# Patient Record
Sex: Female | Born: 1935
Health system: Southern US, Community
[De-identification: ages and names within clinical notes are randomized; demographics above are authoritative.]

## PROBLEM LIST (undated history)

## (undated) DIAGNOSIS — G250 Essential tremor: Principal | ICD-10-CM

## (undated) DIAGNOSIS — I10 Essential (primary) hypertension: Secondary | ICD-10-CM

## (undated) DIAGNOSIS — E669 Obesity, unspecified: Secondary | ICD-10-CM

## (undated) DIAGNOSIS — R112 Nausea with vomiting, unspecified: Secondary | ICD-10-CM

## (undated) DIAGNOSIS — C801 Malignant (primary) neoplasm, unspecified: Secondary | ICD-10-CM

## (undated) DIAGNOSIS — J189 Pneumonia, unspecified organism: Secondary | ICD-10-CM

## (undated) DIAGNOSIS — K429 Umbilical hernia without obstruction or gangrene: Secondary | ICD-10-CM

## (undated) DIAGNOSIS — K8020 Calculus of gallbladder without cholecystitis without obstruction: Secondary | ICD-10-CM

## (undated) DIAGNOSIS — T8859XA Other complications of anesthesia, initial encounter: Secondary | ICD-10-CM

## (undated) DIAGNOSIS — B958 Unspecified staphylococcus as the cause of diseases classified elsewhere: Secondary | ICD-10-CM

## (undated) DIAGNOSIS — Z87440 Personal history of urinary (tract) infections: Secondary | ICD-10-CM

## (undated) DIAGNOSIS — Z9889 Other specified postprocedural states: Secondary | ICD-10-CM

## (undated) DIAGNOSIS — S62109A Fracture of unspecified carpal bone, unspecified wrist, initial encounter for closed fracture: Secondary | ICD-10-CM

## (undated) DIAGNOSIS — M1990 Unspecified osteoarthritis, unspecified site: Secondary | ICD-10-CM

## (undated) DIAGNOSIS — N189 Chronic kidney disease, unspecified: Secondary | ICD-10-CM

## (undated) DIAGNOSIS — R413 Other amnesia: Secondary | ICD-10-CM

## (undated) DIAGNOSIS — R609 Edema, unspecified: Secondary | ICD-10-CM

## (undated) DIAGNOSIS — E1142 Type 2 diabetes mellitus with diabetic polyneuropathy: Secondary | ICD-10-CM

## (undated) DIAGNOSIS — M545 Low back pain, unspecified: Secondary | ICD-10-CM

## (undated) DIAGNOSIS — Z87448 Personal history of other diseases of urinary system: Secondary | ICD-10-CM

## (undated) DIAGNOSIS — T4145XA Adverse effect of unspecified anesthetic, initial encounter: Secondary | ICD-10-CM

## (undated) DIAGNOSIS — K219 Gastro-esophageal reflux disease without esophagitis: Secondary | ICD-10-CM

## (undated) DIAGNOSIS — E785 Hyperlipidemia, unspecified: Secondary | ICD-10-CM

## (undated) DIAGNOSIS — D649 Anemia, unspecified: Secondary | ICD-10-CM

## (undated) DIAGNOSIS — R600 Localized edema: Secondary | ICD-10-CM

## (undated) DIAGNOSIS — M5136 Other intervertebral disc degeneration, lumbar region: Secondary | ICD-10-CM

## (undated) DIAGNOSIS — F5104 Psychophysiologic insomnia: Secondary | ICD-10-CM

## (undated) DIAGNOSIS — M51369 Other intervertebral disc degeneration, lumbar region without mention of lumbar back pain or lower extremity pain: Secondary | ICD-10-CM

## (undated) DIAGNOSIS — G8929 Other chronic pain: Secondary | ICD-10-CM

## (undated) HISTORY — DX: Malignant (primary) neoplasm, unspecified: C80.1

## (undated) HISTORY — PX: OOPHORECTOMY: SHX86

## (undated) HISTORY — PX: EYE SURGERY: SHX253

## (undated) HISTORY — DX: Low back pain: M54.5

## (undated) HISTORY — DX: Localized edema: R60.0

## (undated) HISTORY — PX: COLONOSCOPY: SHX174

## (undated) HISTORY — DX: Other amnesia: R41.3

## (undated) HISTORY — DX: Psychophysiologic insomnia: F51.04

## (undated) HISTORY — DX: Low back pain, unspecified: M54.50

## (undated) HISTORY — PX: ABDOMINAL HYSTERECTOMY: SHX81

## (undated) HISTORY — DX: Other chronic pain: G89.29

## (undated) HISTORY — DX: Calculus of gallbladder without cholecystitis without obstruction: K80.20

## (undated) HISTORY — DX: Type 2 diabetes mellitus with diabetic polyneuropathy: E11.42

## (undated) HISTORY — PX: NECK SURGERY: SHX720

## (undated) HISTORY — PX: HERNIA REPAIR: SHX51

## (undated) HISTORY — DX: Obesity, unspecified: E66.9

## (undated) HISTORY — PX: TOTAL KNEE ARTHROPLASTY: SHX125

## (undated) HISTORY — DX: Unspecified staphylococcus as the cause of diseases classified elsewhere: B95.8

## (undated) HISTORY — DX: Essential tremor: G25.0

## (undated) HISTORY — PX: CATARACT EXTRACTION: SUR2

## (undated) HISTORY — DX: Edema, unspecified: R60.9

## (undated) HISTORY — DX: Fracture of unspecified carpal bone, unspecified wrist, initial encounter for closed fracture: S62.109A

---

## 1998-09-07 HISTORY — PX: CARDIAC CATHETERIZATION: SHX172

## 2000-05-15 ENCOUNTER — Encounter: Payer: Self-pay | Admitting: Emergency Medicine

## 2000-05-15 ENCOUNTER — Emergency Department (HOSPITAL_COMMUNITY): Admission: EM | Admit: 2000-05-15 | Discharge: 2000-05-16 | Payer: Self-pay | Admitting: Emergency Medicine

## 2000-11-24 ENCOUNTER — Ambulatory Visit (HOSPITAL_COMMUNITY): Admission: RE | Admit: 2000-11-24 | Discharge: 2000-11-24 | Payer: Self-pay | Admitting: Gastroenterology

## 2002-09-28 ENCOUNTER — Emergency Department (HOSPITAL_COMMUNITY): Admission: EM | Admit: 2002-09-28 | Discharge: 2002-09-28 | Payer: Self-pay | Admitting: Emergency Medicine

## 2002-11-28 ENCOUNTER — Encounter: Payer: Self-pay | Admitting: Emergency Medicine

## 2002-11-28 ENCOUNTER — Emergency Department (HOSPITAL_COMMUNITY): Admission: EM | Admit: 2002-11-28 | Discharge: 2002-11-29 | Payer: Self-pay | Admitting: Emergency Medicine

## 2002-12-13 ENCOUNTER — Encounter: Payer: Self-pay | Admitting: *Deleted

## 2002-12-13 ENCOUNTER — Encounter: Admission: RE | Admit: 2002-12-13 | Discharge: 2002-12-13 | Payer: Self-pay | Admitting: *Deleted

## 2003-01-10 ENCOUNTER — Encounter: Payer: Self-pay | Admitting: *Deleted

## 2003-01-10 ENCOUNTER — Encounter: Admission: RE | Admit: 2003-01-10 | Discharge: 2003-01-10 | Payer: Self-pay | Admitting: *Deleted

## 2003-01-25 ENCOUNTER — Encounter: Admission: RE | Admit: 2003-01-25 | Discharge: 2003-04-25 | Payer: Self-pay | Admitting: Family Medicine

## 2003-04-18 ENCOUNTER — Encounter: Payer: Self-pay | Admitting: Neurosurgery

## 2003-04-20 ENCOUNTER — Encounter: Payer: Self-pay | Admitting: Neurosurgery

## 2003-04-20 ENCOUNTER — Inpatient Hospital Stay (HOSPITAL_COMMUNITY): Admission: RE | Admit: 2003-04-20 | Discharge: 2003-04-22 | Payer: Self-pay | Admitting: Neurosurgery

## 2003-08-21 ENCOUNTER — Encounter: Admission: RE | Admit: 2003-08-21 | Discharge: 2003-09-27 | Payer: Self-pay | Admitting: Neurosurgery

## 2003-08-22 ENCOUNTER — Encounter: Admission: RE | Admit: 2003-08-22 | Discharge: 2003-08-22 | Payer: Self-pay | Admitting: Infectious Diseases

## 2003-09-08 HISTORY — PX: BACK SURGERY: SHX140

## 2003-12-05 ENCOUNTER — Emergency Department (HOSPITAL_COMMUNITY): Admission: AD | Admit: 2003-12-05 | Discharge: 2003-12-05 | Payer: Self-pay | Admitting: Family Medicine

## 2003-12-06 ENCOUNTER — Emergency Department (HOSPITAL_COMMUNITY): Admission: EM | Admit: 2003-12-06 | Discharge: 2003-12-06 | Payer: Self-pay | Admitting: Family Medicine

## 2005-09-12 ENCOUNTER — Emergency Department (HOSPITAL_COMMUNITY): Admission: EM | Admit: 2005-09-12 | Discharge: 2005-09-12 | Payer: Self-pay | Admitting: Emergency Medicine

## 2006-04-06 ENCOUNTER — Emergency Department (HOSPITAL_COMMUNITY): Admission: EM | Admit: 2006-04-06 | Discharge: 2006-04-06 | Payer: Self-pay | Admitting: Emergency Medicine

## 2006-04-15 ENCOUNTER — Ambulatory Visit: Payer: Self-pay

## 2006-04-17 ENCOUNTER — Inpatient Hospital Stay (HOSPITAL_COMMUNITY): Admission: EM | Admit: 2006-04-17 | Discharge: 2006-04-19 | Payer: Self-pay | Admitting: Emergency Medicine

## 2006-12-13 ENCOUNTER — Inpatient Hospital Stay (HOSPITAL_COMMUNITY): Admission: RE | Admit: 2006-12-13 | Discharge: 2006-12-15 | Payer: Self-pay | Admitting: Orthopedic Surgery

## 2007-03-02 ENCOUNTER — Emergency Department (HOSPITAL_COMMUNITY): Admission: EM | Admit: 2007-03-02 | Discharge: 2007-03-02 | Payer: Self-pay | Admitting: Emergency Medicine

## 2007-04-06 ENCOUNTER — Encounter: Admission: RE | Admit: 2007-04-06 | Discharge: 2007-04-06 | Payer: Self-pay | Admitting: *Deleted

## 2008-07-30 ENCOUNTER — Encounter: Admission: RE | Admit: 2008-07-30 | Discharge: 2008-07-30 | Payer: Self-pay | Admitting: Surgery

## 2009-04-18 ENCOUNTER — Encounter: Admission: RE | Admit: 2009-04-18 | Discharge: 2009-04-18 | Payer: Self-pay | Admitting: Surgery

## 2009-07-08 ENCOUNTER — Encounter: Admission: RE | Admit: 2009-07-08 | Discharge: 2009-07-08 | Payer: Self-pay | Admitting: Surgery

## 2009-07-26 ENCOUNTER — Encounter: Admission: RE | Admit: 2009-07-26 | Discharge: 2009-07-26 | Payer: Self-pay | Admitting: Internal Medicine

## 2009-11-21 ENCOUNTER — Emergency Department (HOSPITAL_BASED_OUTPATIENT_CLINIC_OR_DEPARTMENT_OTHER): Admission: EM | Admit: 2009-11-21 | Discharge: 2009-11-21 | Payer: Self-pay | Admitting: Emergency Medicine

## 2010-09-27 ENCOUNTER — Encounter: Payer: Self-pay | Admitting: *Deleted

## 2010-09-28 ENCOUNTER — Encounter: Payer: Self-pay | Admitting: Surgery

## 2010-09-29 ENCOUNTER — Encounter: Payer: Self-pay | Admitting: Surgery

## 2010-12-01 LAB — DIFFERENTIAL
Basophils Absolute: 0 10*3/uL (ref 0.0–0.1)
Basophils Relative: 0 % (ref 0–1)
Eosinophils Absolute: 0.3 10*3/uL (ref 0.0–0.7)
Eosinophils Relative: 2 % (ref 0–5)
Lymphocytes Relative: 9 % — ABNORMAL LOW (ref 12–46)
Lymphs Abs: 1.4 10*3/uL (ref 0.7–4.0)
Monocytes Absolute: 0.8 10*3/uL (ref 0.1–1.0)
Monocytes Relative: 5 % (ref 3–12)
Neutro Abs: 13.2 10*3/uL — ABNORMAL HIGH (ref 1.7–7.7)
Neutrophils Relative %: 84 % — ABNORMAL HIGH (ref 43–77)

## 2010-12-01 LAB — COMPREHENSIVE METABOLIC PANEL
ALT: 21 U/L (ref 0–35)
AST: 27 U/L (ref 0–37)
Albumin: 4.4 g/dL (ref 3.5–5.2)
Alkaline Phosphatase: 78 U/L (ref 39–117)
BUN: 21 mg/dL (ref 6–23)
CO2: 25 mEq/L (ref 19–32)
Calcium: 9.3 mg/dL (ref 8.4–10.5)
Chloride: 109 mEq/L (ref 96–112)
Creatinine, Ser: 0.7 mg/dL (ref 0.4–1.2)
GFR calc Af Amer: >60 mL/min (ref >60)
GFR calc non Af Amer: >60 mL/min (ref >60)
Glucose, Bld: 169 mg/dL — ABNORMAL HIGH (ref 70–99)
Potassium: 4.9 mEq/L (ref 3.5–5.1)
Sodium: 147 mEq/L — ABNORMAL HIGH (ref 135–145)
Total Bilirubin: 0.9 mg/dL (ref 0.3–1.2)
Total Protein: 8.1 g/dL (ref 6.0–8.3)

## 2010-12-01 LAB — CBC
HCT: 40.4 % (ref 36.0–46.0)
Hemoglobin: 13.6 g/dL (ref 12.0–15.0)
MCHC: 33.8 g/dL (ref 30.0–36.0)
MCV: 81.6 fL (ref 78.0–100.0)
Platelets: 209 10*3/uL (ref 150–400)
RBC: 4.95 MIL/uL (ref 3.87–5.11)
RDW: 14.5 % (ref 11.5–15.5)
WBC: 15.7 10*3/uL — ABNORMAL HIGH (ref 4.0–10.5)

## 2010-12-01 LAB — LIPASE, BLOOD: Lipase: 145 U/L (ref 23–300)

## 2011-01-12 ENCOUNTER — Other Ambulatory Visit: Payer: Self-pay | Admitting: Internal Medicine

## 2011-01-13 ENCOUNTER — Other Ambulatory Visit: Payer: Self-pay | Admitting: Internal Medicine

## 2011-01-13 DIAGNOSIS — N6325 Unspecified lump in the left breast, overlapping quadrants: Secondary | ICD-10-CM

## 2011-01-19 ENCOUNTER — Other Ambulatory Visit: Payer: Self-pay

## 2011-01-20 ENCOUNTER — Ambulatory Visit
Admission: RE | Admit: 2011-01-20 | Discharge: 2011-01-20 | Disposition: A | Discharge status: Home / Self Care | Payer: Medicare Other | Source: Ambulatory Visit | Attending: Internal Medicine | Admitting: Internal Medicine

## 2011-01-20 DIAGNOSIS — N6325 Unspecified lump in the left breast, overlapping quadrants: Secondary | ICD-10-CM

## 2011-01-23 NOTE — Discharge Summary (Signed)
NAMEMarland Kitchen  [name], [name]                          ACCOUNT NO.:  [id number]   MEDICAL RECORD NO.:  [id number]                   PATIENT TYPE:  INP   LOCATION:  3007                                 FACILITY:  [facility]   PHYSICIAN:  Hilda Lias, M.D.                DATE OF BIRTH:  26-Feb-1933   DATE OF ADMISSION:  04/20/2003  DATE OF DISCHARGE:  04/22/2003                                 DISCHARGE SUMMARY   ADMISSION DIAGNOSIS:  Right L4-L5 herniated disc.   FINAL DIAGNOSIS:  Right L4-L5 herniated disc.   HISTORY OF PRESENT ILLNESS:  The patient was admitted because of back and  right leg pain.  This problem has been going on since April and she was seen  by orthopedic surgeon who advised surgery.  Nevertheless, she came to [place] for  a full evaluation.  The MRI showed that she had a herniated disc at the  level of 4-5 and I fully agreed with surgery.  She wanted [place] to take over.   LABORATORY DATA:  Normal.   HOSPITAL COURSE:  The patient was taken to surgery and the right L4-5  discectomy was done.  After the surgery, the patient did well and yesterday,  April 22, 2003, was discharged by Dr. Franky Macho.   CONDITION ON DISCHARGE:  No pain, increase of strength.   MEDICATIONS:  1. Flexeril for muscle relaxer.  2. Vicodin for pain.   DIET:  She is going to continue with her diabetic diet.   ACTIVITY:  She is not to drive at least until I see her.   NOTE:  [name] had surgery at the level of 4-5.  As soon as I finished  the surgery and when I was dictating the operative note, I was approached by  Peterson Lombard, who was the OR nurse in charge of the neurosurgical operating  room.  She told me that it seems that the Kerrison punch I used during the  surgical procedure was clean but it was not sterile.  It seems that the  package that had the Kerrison punch had the colored tag and it was close to  the sterilizer unit but indeed the equipment, it seemed, was not fully  sterilized.  There  was some change in the color but it seemed that the color  was not completely what normal should be.  Because of that, I called  infectious disease.  The lady who had surgery prior to [name] had  anterior cervical discectomy and there was no evidence of any hepatitis or  AIDS.  Nevertheless, I called infectious disease and they are going to do a  work-up of the previous patient, to be sure that there is no evidence of any  viral infections such as AIDS or virus of hepatitis.  Dr. Roxan Hockey from  infectious disease department let me know through Ms. Earlene Plater that there was  no need  to do any type of prophylactic antibiotic except the normal routine  Kefzol that we use after surgery.  I am withholding this information from  the patient until I try to find out indeed what happened.  At this stage, I  have not received any  official report about the instrument being completely sterile or not, so my  point of view is not to preoccupy this lady because this might be something  that indeed was not the case.  Nevertheless, I am going to follow as done  routinely, doing my office __________.                                                Hilda Lias, M.D.    EB/MEDQ  D:  04/23/2003  T:  04/24/2003  Job:  [id number]

## 2011-01-23 NOTE — Discharge Summary (Addendum)
[name], [name]                ACCOUNT NO.:  [id number]   MEDICAL RECORD NO.:  [id number]          PATIENT TYPE:  INP   LOCATION:  3027                         FACILITY:  [facility]   PHYSICIAN:  Pramod P. Pearlean Brownie, MD    DATE OF BIRTH:  Dec 25, 1932   DATE OF ADMISSION:  04/17/2006  DATE OF DISCHARGE:  04/19/2006                                 DISCHARGE SUMMARY   DIAGNOSES AT TIME OF DISCHARGE:  1. Transient ischemic attack versus anxiety versus seizure versus      migraine.  2. Hypertension.  3. Diabetes.  4. Morbid obesity.  5. History of lumbar surgery by Dr. Nile Riggs.   MEDICINES AT TIME OF DISCHARGE:  1. Actos 15 mg a day.  2. Altace 10 mg a day.  3. Hydrochlorothiazide 25 mg a day.  4. Toprol-XL 100 mg a day.  5. Foltx 1 a day.  6. Zocor 200 mg a day.  7. Aspirin 81 mg a day.   STUDIES PERFORMED:  1. Chest x-ray showed low inspiratory lung volumes.  No acute findings.  2. EKG shows normal sinus rhythm.  3. MRI of the brain on April 16, 2006 shows a questionable acute stroke      in the right ____ QA MARKER: 83 ____.  MRA      the hospital is negative or acute stroke.  4. Carotid Doppler shows CA stenosis.  5. Two-D echocardiogram performed April 15, 2006 shows normal ejection      fraction and no embolic stroke.  6. Transcranial Doppler was ordered, not performed.   LABORATORY STUDIES:  CBC normal.  Differential normal.  Chemistry with  glucose 102, BUN 26, creatinine 0.9; otherwise, normal.  Liver function  tests normal.  Hemoglobin A1c 6.6.  Homocysteine was 11.2.  Cholesterol was  171, triglycerides 174, HDL was 46 and LDL was 90.  Urinalysis was normal.  Cardiac enzymes were normal.   HISTORY OF PRESENT ILLNESS:  [name] is a 77 year old Caucasian female  with history of hypertension, diabetes and obesity.  The patient has a  spell 11 days ago from the day of admission while driving when she  suddenly felt confused and disoriented.  She was able to drive  to her  destination, came to the emergency room complaining of difficulty with  speech and movement etiology.  She had a negative workup, including  laboratories and CT of the head and was discharged on aspirin and advised to  followup with her primary care physician.  She saw her primary care  physician the next day and was advised to be seen fairly urgently by a  neurologist.  She was seen by Dr. [facility]arer [facility] 4 to 5  days ago from the day of admission.  The results of that evaluation are  unknown, but she did undergo an MRI of the brain at ____ QA MARKER: 232  ____ Imaging, which is available for review.  They have not   read.  She also had a 2D echocardiogram done a couple of days ago.  In spite  of this, however, she continued to have spells on a daily basis.  These are  relatively sudden in onset and last a few hours.  They consists of  difficulty thinking of things and findings words were just primarily  subjective.  Her daughter says she can answer questions during the episode.  She will also feel anxious and has some irritability, sometimes starting to  cry.  She will feel disoriented and out of place.  Sometimes, she will feel  weak.  Sometimes, she has a headache.  Sometimes, she has blurred vision.  After she had one of these episodes on Saturday, she came back to the  emergency room and is now admitted for a workup.   HOSPITAL COURSE:  MRI was negative for acute infarct.  Dr. Pearlean Brownie does not  think symptoms sound TIA in nature, though cannot completely rule them out.  Other diagnosis and differential include anxiety, seizure and migraine.  As  she has had no acute stroke and she is neurologically stable, patient will  be discharged home.  She will followup with Dr. Nash Shearer in October at her  already previously scheduled appointment.  She will notify his office should  she continue to have spells.  She is okay to  drive per Dr. Pearlean Brownie and okay to  return to work.  She will continue aspirin for stroke prevention.   CONDITION AT DISCHARGE:  Patient alert and oriented x3.  No aphagia or  dysarthria.  She has no cranial nerve deficits.  Her ____ QA MARKER: 342                                              ____ are full.  Her face is symmetric.  Her tongue is midlin  drift and she has symmetric strength, tone and reflexes.  She has no focal  weakness or sensory loss.  Her heart rate is regular and her chest is clear.   DISCHARGE PLAN:  1. Discharge home with daughter.  2. Aspirin 81 mg a day.  3. Followup with Dr. Nash Shearer in October of 2007 as previously scheduled.      [name], N.P.    ______________________________  Sunny Schlein. Pearlean Brownie, MD    SB/MEDQ  D:  04/19/2006  T:  04/19/2006  Job:  [id number]   cc:   Serita Kyle, M.D.  Bevelyn Buckles. Nash Shearer, M.D.

## 2011-01-23 NOTE — H&P (Signed)
[name], [name]                ACCOUNT NO.:  [id number]   MEDICAL RECORD NO.:  [id number]          PATIENT TYPE:  INP   LOCATION:  3027                         FACILITY:  [facility]   PHYSICIAN:  Casimiro Needle L. Reynolds, M.D.DATE OF BIRTH:  09-25-1932   DATE OF ADMISSION:  04/17/2006  DATE OF DISCHARGE:                                HISTORY & PHYSICAL   PRIMARY CARE PHYSICIAN:  _____ Yetta Barre, M.D.   REASON FOR EVALUATION:  Possible transient ischemic attacks.   HISTORY OF PRESENT ILLNESS:  This is the initial [place] start up service  admission for this 77 year old woman with a past medical history that  includes hypertension, diabetes and obesity.  The patient had a spell 11  days ago while driving when she suddenly felt confused and disoriented.  She  was able to drive to her destination and came to the emergency room  complaining of subjective difficulty with speech and mood lability.  She had  a negative workup including laboratories and CT of the head and was  discharged on aspirin and advised to follow up with her primary physician.  She saw her primary physician the next day and was advised to be seen fairly  urgently by a neurologist.  She was seen by Dr. Nash [name] at [facility] about 4-5 days ago.  The results of that evaluation  are unknown but she did undergo an MRA of the brain at [facility]  yesterday which is available for my review although it has not been  officially read.  She also had a 2-D echocardiogram done a couple of days  ago, the results of which are unknown.  In spite of this, however, she said  she continued to have spells on a daily basis.  These are relevantly sudden  in onset and last a few hours.  They consist of difficulty thinking of  things and finding words which is primarily subjective.  Her daughter says  she can answer questions during the episodes.  She also will feel anxious  and have some mood lability, sometimes starting  to cry.  She will feel  disoriented and out of place.  Sometimes she will feel generally weak.  Sometimes she will have a pressure like headache.  Sometimes she will have  blurred vision.  After she had another one of these episodes today she came  back to the emergency room and is now admitted for further workup.   PAST MEDICAL HISTORY:  1. Hypertension.  2. Diabetes.  3. Morbid obesity.  4. She also has a history of lumbar surgery by Dr. Jeral Fruit.   FAMILY HISTORY:  Remarkable for stroke in her father.   SOCIAL HISTORY:  She normally can do her activities of daily living.  She  does not smoke.   ALLERGIES:  CODEINE.   MEDICATIONS:  1. Actos 15 mg daily.  2. Altace 10 mg daily.  3. Hydrochlorothiazide 25 mg daily.  4. Toprol XL 100 mg daily.  5. Foltx one daily.  6. Zocor 200 mg daily.  7. Baby aspirin.  Of note, the baby  aspirin started in the emergency room      11 days ago.   The Foltx and Zocor were started by Dr. Nash [name] within the past few days.   REVIEW OF SYSTEMS:  Full review of systems is negative except as outlined in  the HPI and in the admission nursing record.   PHYSICAL EXAMINATION:  VITAL SIGNS:  Temperature 97.5, blood pressure  158/75, pulse 69, respirations 18.  GENERAL:  This is an obese but otherwise healthy-appearing woman supine in  the hospital bed in no acute distress.  HEENT:  Normocephalic, atraumatic.  Oropharynx is benign.  NECK:  Supple without carotid or supraclavicular bruits.  CHEST:  Clear to auscultation bilaterally.  HEART:  Regular rate and rhythm without murmurs.  ABDOMEN:  Soft, normoactive bowel sounds.  EXTREMITIES:  1+ edema, 2+ pulses.  NEUROLOGIC/MENTAL STATUS:  She is awake and alert.  She is oriented to time,  place and person.  Recent and remote memory are adequate.  Speech is fluent  and not dysarthric.  There are no defects to confrontational naming.  She  can repeat a phrase.  Her affect is anxious and her mood is  consistent with  this.  Cranial nerves and funduscopic exam is benign.  Pupils are equal,  brisk and reactive.  Extraocular movements are full without nystagmus.  Visual fields are full to confrontation.  Hearing is intact to  conversational speech.  The patient's sensation is intact to pin prick.  Face, tongue and palate move normally and symmetrically.  Shoulder shrug  strength is normal. Motor testing:  Normal bulk and tone.  Normal strength  in all tested extremity muscles.  Sensation is intact to pinprick and double  simultaneous stimulation in all extremities.  Coordination and rapid  movements are performed well.  Finger-to-nose is performed well.  Gait:  She  arises easily from a chair, and her stance is normal.  She is able to  ambulate without difficulty.  Reflexes are 2+ and symmetric.  Toes are  downgoing bilaterally.   LABORATORY DATA:  CBC:  White count 6.5, hemoglobin 12.7, platelets 207,000.  Emergent chemistries in the emergency room were normal including a glucose  of 102 and a mildly elevated albumin of 26.  Creatinine is normal at 0.9.  Urinalysis is negative.  Cardiac enzymes are negative.  MRI of the brain  performed at [facility] yesterday is personally reviewed.  They raised  the question of an acute stroke in the right side of splenium of the corpus  callosum.  However, note that diffusion weighted images at [facility] are  notoriously difficult to interpret.  The brain is otherwise basically  unremarkable, and the MRAs looked okay.   IMPRESSION:  Suspected transient ischemic attack.   PLAN:  1. Will admit to the hospital for observation.  Will check a repeat      limited MRI looking at diffusion weighted and flare images to try to      confirm the finding on the [facility] scan or to rule out as artifact.  She      will have carotid and transcranial Dopplers checked on Monday.  2. We will evaluate her risks factors and place on telemetry monitoring. 3. Stroke  service to follow.     Michael L. Thad Ranger, M.D.  Electronically Signed    MLR/MEDQ  D:  04/17/2006  T:  04/18/2006  Job:  [id number]

## 2011-01-23 NOTE — Op Note (Signed)
NAMEMarland Kitchen  [name], HABIG                          ACCOUNT NO.:  [id number]   MEDICAL RECORD NO.:  [id number]                   PATIENT TYPE:  INP   LOCATION:  3007                                 FACILITY:  [facility]   PHYSICIAN:  Hilda Lias, M.D.                DATE OF BIRTH:  1933-07-17   DATE OF PROCEDURE:  04/20/2003  DATE OF DISCHARGE:                                 OPERATIVE REPORT   PREOPERATIVE DIAGNOSES:  1. Right L4-5 herniated disk a weakened dorsiflexor of the right foot.  2. Diabetes mellitus.   POSTOPERATIVE DIAGNOSES:  1. Right L4-5 herniated disk a weakened dorsiflexor of the right foot.  2. Diabetes mellitus.   PROCEDURES:  1. Right L4-5 diskectomy, decompression of the L4 and L5 nerve roots.  2. Foraminotomy.  3. Microscope.   SURGEON:  Hilda Lias, M.D.   ASSISTANT:  Coletta Memos, M.D.   CLINICAL HISTORY:  The patient was admitted because of back pain with  radiation down to the right leg.  The patient has an MRI showing a fragment  going to the body of L5.  The patient was seen by orthopedic surgeons who  advised surgery, but she declined.  She came to see [place] a few weeks ago  complaining of worsening of the pain.  MRI done in April 2004 showed a  herniated disk with a fragment at the level of L5.  Surgery was advised.   DESCRIPTION OF PROCEDURE:  The patient was taken to the OR, where she was  positioned in a prone manner.  The back was prepped with Betadine.  Because  of her obesity, we did an x-ray which showed that indeed the needle was at  the level of L4-5.  From then on we opened the skin at that area.  The  incision was created down through the skin and subcutaneous tissue down to  the fascia.  Muscle was reflected on the right side.  We identified the body  of the lamina of L4, L5.  With the microscope we drilled the lower lamina of  L5 and the upper of L4.  A thick calcified ligament was also excised.  We  brought the microscope into the  area.  We did a reduction and we retracted  the L5 nerve root.  Indeed, there was a herniated disk with fragment going  lateral.  Incision was made, and we entered the disk space.  Total gross  diskectomy medial and lateral was achieved.  Then we retracted the thecal  sac below L5.  Retraction was medial.  Using several hooks, we started  looking into the body of L5.  There was not any fragment found, but we found  mostly fibrosis.  We looked down, and indeed we were able to fill the S1  foramen, which was open.  At the end we have good decompression of L5, L4.  There was no  evidence of any fragment in the body of L5 despite multiple  exploration.  Having a good decompression, the thecal sac was freed without  any retraction.  Then Valsalva maneuver was negative.  Fentanyl and Depo-  Medrol were left in the epidural space, and the wound was closed with Vicryl  and a Steri-Strip.                                              Hilda Lias, M.D.   EB/MEDQ  D:  04/20/2003  T:  04/21/2003  Job:  [id number]

## 2011-01-23 NOTE — Op Note (Signed)
NAMEANOKHI, [name]                ACCOUNT NO.:  [id number]   MEDICAL RECORD NO.:  [id number]          PATIENT TYPE:  INP   LOCATION:  2899                         FACILITY:  [facility]   PHYSICIAN:  Mila Homer. Sherlean Foot, M.D. DATE OF BIRTH:  1932-10-16   DATE OF PROCEDURE:  12/13/2006  DATE OF DISCHARGE:                               OPERATIVE REPORT   SURGEON:  Mila Homer. Sherlean Foot, M.D.   ASSISTANT:  Legrand Pitts. Duffy, P.A.   ANESTHESIA:  General.   PREOPERATIVE DIAGNOSIS:  Right knee osteoarthritis.   POSTOPERATIVE DIAGNOSIS:  Right knee osteoarthritis.   PROCEDURE:  Right total knee arthroplasty.   INDICATIONS FOR PROCEDURE:  The patient is a 77 year old white female  with failure of conservative measures for osteoarthritis of the knee.  Informed consent was obtained.   DESCRIPTION OF PROCEDURE:  The patient was placed in the supine position  under general anesthesia with Foley catheter placement.  Right leg was  then prepped and draped in the usual sterile fashion.  A straight 8-inch  incision was made with the #10 blade.  A new blade was used to make a  median parapatellar arthrotomy; and to perform a synovectomy.  I then  everted the patella and measured it at 23-mm thick.  We used the 32-mm  reamer and then went into the 90-degrees of flexion; and I used the  extramedullary alignment system.  I made a perpendicular cut into the  anatomic axis of tibia with the sagittal saw.  I removed the bony piece;  and the extramedullary guide.   An intramedullary drill hole in the femur.  I placed the intramedullary  guide set on a 4-degree valgus cut; and then put the distal femoral  cutting block into place; and made the distal femoral cut with a  sagittal saw.  I then marked out the epicondylar axis which measured 3  degrees.  We sized to a size F and then a 3-degree external rotation  holes.  I then fastened the 4-in-1 cutting block to the knee and made to  the anterior-posterior and  chamfer cuts with the sagittal saw.  I then  placed the laminal spreader in the knee; and removed the ACL, PCL, and  medial and lateral menisci; posterior condylar osteophytes.  Then I had  to release on the lateral side to obtain flexion/extension gap balance.  I then finished with a size F finishing block drill and keel, finished  the tibia with a size 5 tibial tray, drilling keel, and trialed with a  size 5, tibia size F, femur, size 14 insert and 32-mm patella had good  flexion/extension gap and balance, dropped the angle back to 110 limited  with only the BICAP ratio.   I then then removed the trial components and copiously irrigated.  I  then cemented all the components removing excess cement and let the  cement harden in extension.  I then placed the Hemovac coming out  superolaterally and deepened the arthrotomy and placed a pain catheter  coming out superomedially and superficially to the arthrotomy.  I let  the tourniquet  down after the cement was hardened and obtained  hemostasis and copiously irrigated.  I then closed the arthrotomy with  figure-of-eight #1 Vicryl sutures in the deep soft tissues with a #0  Vicryl subcuticular, and 2-0 Vicryl stitch, and skin staples.  Dressed  with Adaptic, 4 x 4,  sterile Webril, and TED stocking.   COMPLICATIONS:  None.   DRAINS:  One Hemovac one pain catheter.   ESTIMATED BLOOD LOSS:  300 mL.   TOURNIQUET TIME:  57 minutes.           ______________________________  Mila Homer Sherlean Foot, M.D.     SDL/MEDQ  D:  12/13/2006  T:  12/13/2006  Job:  [id number]

## 2011-01-23 NOTE — H&P (Signed)
NAMECAROLAN, [name]                ACCOUNT NO.:  [id number]   MEDICAL RECORD NO.:  [id number]          PATIENT TYPE:  INP   LOCATION:  NA                           FACILITY:  [facility]   PHYSICIAN:  Mila Homer. Sherlean Foot, M.D. DATE OF BIRTH:  1933-03-29   DATE OF ADMISSION:  12/13/2006  DATE OF DISCHARGE:                              HISTORY & PHYSICAL   CHIEF COMPLAINT:  Right knee pain.   HISTORY OF PRESENT ILLNESS:  [name] is a 77 year old female with  right knee pain for at least two years, no injuries or surgeries.  The  patient has failed conservative treatment which included cortisone  injections.  Mechanical symptoms positive for giving way.  The patient  notes she is cane ambulate.  The pain is described as intermittent  severe burning pain about the right knee.  Overall, her pain is getting  worse.  No waking pain.  X-rays four-view of the knee show complete bone-  on-bone collapse to the lateral side.  She also has patellofemoral  disease.   ALLERGIES:  CODEINE CAUSES NAUSEA.   MEDICATIONS:  1. Toprol 100 mg one daily.  2. Ramipril 10 mg 1 daily.  3. Diclofenac 75 mg 1 to 2 daily, stopped last week.  4. Simvastatin 20 mg 1 tablet daily.  5. Actos 15 mg 1 daily.  6. HCTZ 25 mg 1 daily.  7. Aspirin 81 mg 1 daily, stopped December 07, 2006.   PAST MEDICAL HISTORY:  1. Essential hypertension.  2. Diabetes mellitus type 2.   PAST SURGICAL HISTORY:  1. Hysterectomy in 1972.  2. Hernia repair in 1991.  3. Lumbar probably diskectomy 2004.   The patient denies any complications or any blood transfusions with the  above procedures.   SOCIAL HISTORY:  The patient denies any tobacco use or alcohol use.  She  lives alone in an apartment that has elevator and ramps.  She does live  in a two-story residency.   FAMILY HISTORY:  The patient's mother deceased at age 77, history of  hypertension, cause of demise unknown.  Father deceased at age 87,  history of strokes and  hypertension.  She has two living brothers, one  age 4 who has hypertension and diabetes.  Second brother age 80 with  mitral valve prolapse.  The patient's older sister died at age 59 due to  complications with CHF.  She has one younger sister age 80 who has a  history of MIs, stent placement, and hypertension.   REVIEW OF SYSTEMS:  The patient wears glasses.  She has hypertension,  diabetes, has had a history of bladder infections who has nocturia, no  known active bladder infections at this time.  Review of systems  otherwise negative or noncontributory.   PHYSICAL EXAMINATION:  The patient is a well-developed, well-nourished  overweight female who walks with the aid of a cane and has an antalgic  gait and limp on the left.  The patient has a valgus deformity at the  right knee.  The patient's mood and affect were appropriate, converses  freely  with examiner.  VITAL SIGNS:  The patient's approximate weight is 252 pounds, height is  5 feet 9-1/2 inches.  Temperature is 98.4, pulse 70, respiratory rate  16, blood pressure 152/70.  HEENT:  Head is normocephalic, atraumatic without problem.  Maxillary  sinus is tender to palpation.  Conjunctivae is pink.  Sclerae is  nonicteric, PERRLA.  EOMs are intact.  No visible external ear  deformities.  TMs are pearly gray bilaterally.  Nose:  Nasal septum  midline, nasal mucosa pink, moist without polyps.  Buccal mucosa pink,  moist.  She has several teeth missing from the upper and lower jawline.  No gingivitis is noted.  Pharynx without erythema and exudate.  Tongue  and uvula midline.  NECK:  Trachea is midline, no lymphadenopathy.  Carotids 2+ without  bruits.  The patient has full range of motion of the cervical spine and  no tenderness along the cervical spine.  CARDIAC:  Regular rate and rhythm without any murmurs, rubs, or gallops  noted.  LUNGS:  Clear to auscultation bilaterally, no wheezing, rhonchi, or  rales noted.  ABDOMEN:   Soft, nontender bowel sounds x4 quadrants, obesity.  BACK:  No tenderness with palpation of the thoracic and lumbar spine.  BREAST, GENITOURINARY, AND RECTAL:  Deferred at this time.  NEUROLOGIC:  The patient is alert and oriented x3.  Cranial Nerves II-  XII grossly intact.  Lower extremity strength testing 5 out of 5  bilaterally.  Deep tendon reflexes are 1+ at the knees and ankles  bilaterally equal and symmetric.  MUSCULOSKELETAL:  The upper extremities, she has full forward flexion of  both shoulders, both arms, and has full internal rotation.  Full range  of motion of the elbows, wrists, and hands.  Radial pulses are 2+  bilaterally.  Good sensation to light touch throughout the fingertips.  Upper extremity are equal and symmetric in size and shape.  Lower  extremities:  She has full range of motion of both hips without pain.  Flexion to 90 degrees of both hips is nonpainful.  Bilateral knees 0 to  105 degrees, the patient only inhibited by the circumference of the leg.  Right knee with a valgus deformity at 5 degrees, no effusion, no edema.  Valgus/varus stress reveals no laxity.  She has tenderness along both  medial and to a greater extent along the lateral joint line.  Lower  extremities:  She does have +1 pitting edema.  Dorsal pedal pulses are  2+ bilaterally.  She has good sensation to light touch in the tips of  the toes throughout.   IMPRESSION:  1. End-stage osteoarthritis of the right knee.  2. Hypertension.  3. Diabetes mellitus type 2.   PLAN:  The patient is to be admitted to [facility] December 16, 2006 to undergo a right total knee arthroplasty by Dr. Sherlean Foot.  Prior to  surgery, the patient is to undergo all preoperative labs and testing.  The patient did receive clearance from Dr. Odetta Pink.      Richardean Canal, P.A.    ______________________________  Mila Homer. Sherlean Foot, M.D.   GC/MEDQ  D:  12/07/2006  T:  12/07/2006  Job:  [id number]   cc:    Mila Homer. Sherlean Foot, M.D.

## 2011-01-23 NOTE — H&P (Signed)
   NAMEMarland Kitchen  [name], [name]                          ACCOUNT NO.:  [id number]   MEDICAL RECORD NO.:  [id number]                   PATIENT TYPE:  INP   LOCATION:  3007                                 FACILITY:  [facility]   PHYSICIAN:  Hilda Lias, M.D.                DATE OF BIRTH:  [birth year]   DATE OF ADMISSION:  04/20/2003  DATE OF DISCHARGE:                                HISTORY & PHYSICAL   HISTORY OF PRESENT ILLNESS:  [name] is a lady who came to see me  because of back pain radiating down to the right leg since March 2004.  The  pain is getting worse.  She had been having conservative treatment and she  was seen by an orthopedic surgeon who advised surgery.  The patient is  having quite a bit of discomfort and weakness.  She denies any problem with  the left leg.   PAST MEDICAL HISTORY:  Hysterectomy; inguinal hernia repair.   ALLERGIES:  She is allergic to CODEINE.   SOCIAL HISTORY:  Social history is negative.   FAMILY HISTORY:  Family history is unremarkable.   REVIEW OF SYSTEMS:  Review of systems is positive for high blood pressure,  back pain and diabetes.   PHYSICAL EXAMINATION:  GENERAL:  This is a patient who came to my office  with her daughter.  She was limping from the right leg.  HEENT:  Head, ears, nose and throat normal.  NECK:  Normal.  LUNGS:  Lungs clear.  HEART:  Heart sounds normal.  ABDOMEN:  Normal.  EXTREMITIES:  Normal pulses.  NEUROLOGIC:  Mental status normal.  Cranial nerves normal.  Strength normal  except that in the right foot, she has a 3/5 weakness of dorsiflexion and  plantarflexion.  Reflexes 1+.  Sensation:  She complains of numbness which  involves mostly the L5 nerve root.   IMAGING STUDIES:  The MRI showed that she had a right herniated disk at the  level of 4-5 with a fragment going to the body of L5.   CLINICAL IMPRESSION:  Right L4-L5 herniated disk.    RECOMMENDATION:  The patient is being admitted for surgery.  The  procedure  will be a right L4-L5 diskectomy.  She knows about the risks such as  infection, CSF leak, worsening pain and paralysis, as well as the need for  further surgery and all the risks associated with her diabetes.                                                Hilda Lias, M.D.    EB/MEDQ  D:  04/20/2003  T:  04/21/2003  Job:  [id number]

## 2011-02-08 ENCOUNTER — Emergency Department (INDEPENDENT_AMBULATORY_CARE_PROVIDER_SITE_OTHER): Payer: Medicare Other

## 2011-02-08 ENCOUNTER — Emergency Department (HOSPITAL_BASED_OUTPATIENT_CLINIC_OR_DEPARTMENT_OTHER)
Admission: EM | Admit: 2011-02-08 | Discharge: 2011-02-08 | Disposition: A | Discharge status: Home / Self Care | Payer: Medicare Other | Attending: Emergency Medicine | Admitting: Emergency Medicine

## 2011-02-08 DIAGNOSIS — R509 Fever, unspecified: Secondary | ICD-10-CM | POA: Insufficient documentation

## 2011-02-08 DIAGNOSIS — Z79899 Other long term (current) drug therapy: Secondary | ICD-10-CM | POA: Insufficient documentation

## 2011-02-08 DIAGNOSIS — R109 Unspecified abdominal pain: Secondary | ICD-10-CM

## 2011-02-08 DIAGNOSIS — M47814 Spondylosis without myelopathy or radiculopathy, thoracic region: Secondary | ICD-10-CM

## 2011-02-08 DIAGNOSIS — I10 Essential (primary) hypertension: Secondary | ICD-10-CM | POA: Insufficient documentation

## 2011-02-08 DIAGNOSIS — N390 Urinary tract infection, site not specified: Secondary | ICD-10-CM | POA: Insufficient documentation

## 2011-02-08 DIAGNOSIS — G8929 Other chronic pain: Secondary | ICD-10-CM | POA: Insufficient documentation

## 2011-02-08 DIAGNOSIS — E119 Type 2 diabetes mellitus without complications: Secondary | ICD-10-CM | POA: Insufficient documentation

## 2011-02-08 LAB — CBC
HCT: 35.2 % — ABNORMAL LOW (ref 36.0–46.0)
Hemoglobin: 12 g/dL (ref 12.0–15.0)
MCH: 27.8 pg (ref 26.0–34.0)
MCHC: 34.1 g/dL (ref 30.0–36.0)
MCV: 81.5 fL (ref 78.0–100.0)
Platelets: 147 10*3/uL — ABNORMAL LOW (ref 150–400)
RBC: 4.32 MIL/uL (ref 3.87–5.11)
RDW: 14.1 % (ref 11.5–15.5)
WBC: 9 10*3/uL (ref 4.0–10.5)

## 2011-02-08 LAB — URINE MICROSCOPIC-ADD ON

## 2011-02-08 LAB — COMPREHENSIVE METABOLIC PANEL
ALT: 8 U/L (ref 0–35)
AST: 18 U/L (ref 0–37)
Albumin: 3.8 g/dL (ref 3.5–5.2)
Alkaline Phosphatase: 58 U/L (ref 39–117)
BUN: 14 mg/dL (ref 6–23)
CO2: 22 mEq/L (ref 19–32)
Calcium: 9.7 mg/dL (ref 8.4–10.5)
Chloride: 98 mEq/L (ref 96–112)
Creatinine, Ser: 0.8 mg/dL (ref 0.4–1.2)
GFR calc Af Amer: >60 mL/min (ref >60)
GFR calc non Af Amer: >60 mL/min (ref >60)
Glucose, Bld: 196 mg/dL — ABNORMAL HIGH (ref 70–99)
Potassium: 4.4 mEq/L (ref 3.5–5.1)
Sodium: 135 mEq/L (ref 135–145)
Total Bilirubin: 1 mg/dL (ref 0.3–1.2)
Total Protein: 7.6 g/dL (ref 6.0–8.3)

## 2011-02-08 LAB — DIFFERENTIAL
Basophils Absolute: 0 10*3/uL (ref 0.0–0.1)
Basophils Relative: 0 % (ref 0–1)
Eosinophils Absolute: 0 10*3/uL (ref 0.0–0.7)
Eosinophils Relative: 0 % (ref 0–5)
Lymphocytes Relative: 7 % — ABNORMAL LOW (ref 12–46)
Lymphs Abs: 0.6 10*3/uL — ABNORMAL LOW (ref 0.7–4.0)
Monocytes Absolute: 0.5 10*3/uL (ref 0.1–1.0)
Monocytes Relative: 6 % (ref 3–12)
Neutro Abs: 7.8 10*3/uL — ABNORMAL HIGH (ref 1.7–7.7)
Neutrophils Relative %: 87 % — ABNORMAL HIGH (ref 43–77)

## 2011-02-08 LAB — URINALYSIS, ROUTINE W REFLEX MICROSCOPIC
Bilirubin Urine: NEGATIVE
Glucose, UA: 250 mg/dL — AB
Ketones, ur: 15 mg/dL — AB
Nitrite: POSITIVE — AB
Protein, ur: 100 mg/dL — AB
Specific Gravity, Urine: 1.02 (ref 1.005–1.030)
Urobilinogen, UA: 0.2 mg/dL (ref 0.0–1.0)
pH: 6.5 (ref 5.0–8.0)

## 2011-02-08 LAB — LIPASE, BLOOD: Lipase: 20 U/L (ref 11–59)

## 2011-02-11 LAB — URINE CULTURE
Colony Count: >=100000
Culture  Setup Time: 201206040013

## 2011-04-24 ENCOUNTER — Other Ambulatory Visit: Payer: Self-pay

## 2011-04-24 ENCOUNTER — Emergency Department (HOSPITAL_BASED_OUTPATIENT_CLINIC_OR_DEPARTMENT_OTHER)
Admission: EM | Admit: 2011-04-24 | Discharge: 2011-04-25 | Disposition: A | Discharge status: Home / Self Care | Payer: Medicare Other | Attending: Emergency Medicine | Admitting: Emergency Medicine

## 2011-04-24 DIAGNOSIS — E119 Type 2 diabetes mellitus without complications: Secondary | ICD-10-CM | POA: Insufficient documentation

## 2011-04-24 DIAGNOSIS — M542 Cervicalgia: Secondary | ICD-10-CM | POA: Insufficient documentation

## 2011-04-24 DIAGNOSIS — R209 Unspecified disturbances of skin sensation: Secondary | ICD-10-CM | POA: Insufficient documentation

## 2011-04-24 DIAGNOSIS — E785 Hyperlipidemia, unspecified: Secondary | ICD-10-CM | POA: Insufficient documentation

## 2011-04-24 DIAGNOSIS — M5412 Radiculopathy, cervical region: Secondary | ICD-10-CM

## 2011-04-24 DIAGNOSIS — Z8739 Personal history of other diseases of the musculoskeletal system and connective tissue: Secondary | ICD-10-CM | POA: Insufficient documentation

## 2011-04-24 DIAGNOSIS — J3489 Other specified disorders of nose and nasal sinuses: Secondary | ICD-10-CM | POA: Insufficient documentation

## 2011-04-24 DIAGNOSIS — I10 Essential (primary) hypertension: Secondary | ICD-10-CM | POA: Insufficient documentation

## 2011-04-24 HISTORY — DX: Unspecified osteoarthritis, unspecified site: M19.90

## 2011-04-24 HISTORY — DX: Hyperlipidemia, unspecified: E78.5

## 2011-04-24 HISTORY — DX: Essential (primary) hypertension: I10

## 2011-04-24 LAB — URINALYSIS, ROUTINE W REFLEX MICROSCOPIC
Bilirubin Urine: NEGATIVE
Glucose, UA: NEGATIVE mg/dL
Hgb urine dipstick: NEGATIVE
Ketones, ur: NEGATIVE mg/dL
Leukocytes, UA: NEGATIVE
Nitrite: NEGATIVE
Protein, ur: NEGATIVE mg/dL
Specific Gravity, Urine: 1.012 (ref 1.005–1.030)
Urobilinogen, UA: 0.2 mg/dL (ref 0.0–1.0)
pH: 5.5 (ref 5.0–8.0)

## 2011-04-24 MED ORDER — ONDANSETRON 8 MG PO TBDP
ORAL_TABLET | ORAL | Status: AC
  Filled 2011-04-24: qty 1

## 2011-04-24 MED ORDER — SODIUM CHLORIDE 0.9 % IV SOLN: INTRAVENOUS | Status: DC

## 2011-04-24 MED ORDER — SODIUM CHLORIDE 0.9 % IV BOLUS (SEPSIS)
250.0000 mL | Freq: Once | INTRAVENOUS | Status: AC
  Administered 2011-04-24: 250 mL via INTRAVENOUS

## 2011-04-24 MED ORDER — PROMETHAZINE HCL 25 MG/ML IJ SOLN
12.5000 mg | Freq: Once | INTRAMUSCULAR | Status: AC
  Administered 2011-04-24: 12.5 mg via INTRAVENOUS
  Filled 2011-04-24: qty 1

## 2011-04-24 MED ORDER — HYDROCODONE-ACETAMINOPHEN 5-325 MG PO TABS: 1.0000 | ORAL_TABLET | Freq: Four times a day (QID) | ORAL | Status: AC | PRN

## 2011-04-24 MED ORDER — HYDROMORPHONE HCL 2 MG/ML IJ SOLN
2.0000 mg | Freq: Once | INTRAMUSCULAR | Status: AC
  Administered 2011-04-24: 2 mg via INTRAMUSCULAR
  Filled 2011-04-24: qty 1

## 2011-04-24 MED ORDER — ONDANSETRON 8 MG PO TBDP
8.0000 mg | ORAL_TABLET | Freq: Once | ORAL | Status: AC
  Administered 2011-04-24: 8 mg via ORAL

## 2011-04-24 MED ORDER — ONDANSETRON HCL 4 MG/2ML IJ SOLN
4.0000 mg | Freq: Once | INTRAMUSCULAR | Status: AC
  Administered 2011-04-24: 4 mg via INTRAVENOUS
  Filled 2011-04-24: qty 2

## 2011-04-24 NOTE — ED Provider Notes (Addendum)
History     CSN: [id number] Arrival date & time: 04/24/2011  7:28 PM  Chief Complaint  Patient presents with  . Arm Pain    Left arm   The history is provided by the patient and a relative.   HERE FROM URGENT CARE FOR PERSISTENT LEFT SHOULDER TO HAND PAIN AND SOME NUMBNESS OF MIDDLE FINGER. NO WEAKNESS. ALREADY HAS HAD PLAIN NECK FILMS BY URGENT CARE AND COURSE OF PREDNISONE AND LORTAB NOT GETTING BETTER. ? SENT FOR CT OF NECK. NO TRAUMA OR FALL. SYMPTOMS ON GOING FOR 12 DAYS. NOT WORSE NOT BETTER. NO FEVER NO WEAKNESS. ALSO CW PERSISTENT UTI FROM June BUT NO SYMPTOMS NOW.  NO OTHER COMPLAINT. PAIN IS 8/10.    Past Medical History  Diagnosis Date  . Diabetes mellitus   . Hypertension   . Hyperlipemia   . Arthritis     Past Surgical History  Procedure Date  . Abdominal hysterectomy   . Hernia repair   . Total knee arthroplasty   . Back surgery     History reviewed. No pertinent family history.  History  Substance Use Topics  . Smoking status: Never Smoker   . Smokeless tobacco: Never Used  . Alcohol Use: No    OB History    Grav Para Term Preterm Abortions TAB SAB Ect Mult Living                  Review of Systems  Constitutional: Negative for fever.  HENT: Positive for congestion and neck pain. Negative for neck stiffness.   Eyes: Negative for redness and visual disturbance.  Respiratory: Negative for cough and shortness of breath.   Cardiovascular: Negative for chest pain and palpitations.  Gastrointestinal: Negative for nausea, vomiting and abdominal pain.  Genitourinary: Negative for dysuria and hematuria.  Musculoskeletal: Negative for back pain.  Neurological: Positive for numbness. Negative for facial asymmetry, speech difficulty, weakness and headaches.  Hematological: Does not bruise/bleed easily.  Psychiatric/Behavioral: Negative for confusion.    Physical Exam  BP 175/75  Pulse 75  Temp(Src) 99 F (37.2 C) (Oral)  Resp 21  Ht 5\' 9"  (1.753 m)   Wt 250 lb (113.399 kg)  BMI 36.92 kg/m2  SpO2 100%  Physical Exam  Nursing note and vitals reviewed. Constitutional: She is oriented to person, place, and time. She appears well-developed and well-nourished. No distress.  HENT:  Head: Normocephalic and atraumatic.  Mouth/Throat: Oropharynx is clear and moist.  Eyes: Conjunctivae and EOM are normal. Pupils are equal, round, and reactive to light.  Neck: Normal range of motion. Neck supple. No tracheal deviation present.  Cardiovascular: Normal rate and regular rhythm.   Murmur heard. Pulmonary/Chest: Effort normal and breath sounds normal. She exhibits no tenderness.  Abdominal: Soft. Bowel sounds are normal. There is no tenderness.  Musculoskeletal: Normal range of motion. She exhibits no edema and no tenderness.  Lymphadenopathy:    She has no cervical adenopathy.  Neurological: She is alert and oriented to person, place, and time. She has normal reflexes. She displays normal reflexes. No cranial nerve deficit. She exhibits normal muscle tone. Coordination normal.  Skin: Skin is warm and dry. No rash noted. No erythema.    ED Course  Procedures  AT DISCHARGE GOT NAUSEATED AND DID NOT RESOLVE WITH ZOFRAN ODT SO GAVE IV FLUIDS AND PHENEGAN AND REPEAT ZOFRAN. RECHECKED EKG ALTHOUGH NO CP AND IT WAS UNCHANGED. AFTER THESE TX FEELING BETTER AND READY FOR FINAL DISCHARGE.    Date: 04/24/2011  Rate: 73  Rhythm: normal sinus rhythm  QRS Axis: normal  Intervals: normal  ST/T Wave abnormalities: nonspecific ST changes  Conduction Disutrbances:none  Narrative Interpretation:   Old EKG Reviewed: unchanged FROM 03/02/07   MDM  CW LEFT CERVICAL RADICULOPATHY. HAS ALREADY HAD PLAIN CERVICAL XRAYS. IT HAS NOW BEEN 12 DAYS - TIME FOR MRI, CT WOULD NOT BE THAT HELPFUL. ALSO URINE NOW NOT CW UTI SO INFECTION FORM June HAS RESOLVED. PATIENT WAS CONCERNED ABOUT THAT.       Shelda Jakes, MD 04/24/11 2126  Shelda Jakes,  MD 04/25/11 [phone]

## 2011-04-24 NOTE — ED Notes (Signed)
Discharged delayed d/t pt experience severe nausea.  Orders for Zofran 8mg  ODT were received and med given to pt.  Pt states that she is beginning to get some relief from Zofran.  Will reassess shortly.

## 2011-04-24 NOTE — ED Notes (Signed)
Pt states that she has a burning pain in her L arm which has been present for 12 days.  Pt states that she was seen by an urgent care and was told that she had nerve compression in her cervical spine d/t arthritis.  Pt had x-rays performed at [facility] but they recommended that she have a CT scan FU if she continued to have pain.  See paperwork from visit in paper chart.

## 2011-04-24 NOTE — ED Notes (Signed)
Pt states that she feels nauseated , spoke with dr.

## 2011-04-24 NOTE — ED Notes (Signed)
Pt is actively vomiting at this time, Dr Deretha Emory notified.

## 2011-04-24 NOTE — ED Notes (Signed)
Pt stated during triage process that she wanted to have another UA completed so that she could see if the "pneumonia in my urine is gone"

## 2011-04-25 NOTE — ED Notes (Signed)
Pt states that she is feeling much better, vitals have improved, pain relieved, and nausea has subsided.  Pt ambulated to discharge desk.

## 2011-04-26 ENCOUNTER — Emergency Department (HOSPITAL_COMMUNITY): Payer: Medicare Other

## 2011-04-26 ENCOUNTER — Emergency Department (HOSPITAL_COMMUNITY)
Admission: EM | Admit: 2011-04-26 | Discharge: 2011-04-26 | Disposition: A | Discharge status: Home / Self Care | Payer: Medicare Other | Attending: Emergency Medicine | Admitting: Emergency Medicine

## 2011-04-26 DIAGNOSIS — R0602 Shortness of breath: Secondary | ICD-10-CM | POA: Insufficient documentation

## 2011-04-26 DIAGNOSIS — M5412 Radiculopathy, cervical region: Secondary | ICD-10-CM | POA: Insufficient documentation

## 2011-04-26 DIAGNOSIS — I10 Essential (primary) hypertension: Secondary | ICD-10-CM | POA: Insufficient documentation

## 2011-04-26 DIAGNOSIS — R079 Chest pain, unspecified: Secondary | ICD-10-CM | POA: Insufficient documentation

## 2011-04-26 DIAGNOSIS — E119 Type 2 diabetes mellitus without complications: Secondary | ICD-10-CM | POA: Insufficient documentation

## 2011-04-26 LAB — BASIC METABOLIC PANEL
BUN: 24 mg/dL — ABNORMAL HIGH (ref 6–23)
CO2: 27 mEq/L (ref 19–32)
Calcium: 10 mg/dL (ref 8.4–10.5)
Chloride: 100 mEq/L (ref 96–112)
Creatinine, Ser: 1.02 mg/dL (ref 0.50–1.10)
GFR calc Af Amer: >60 mL/min (ref >60)
GFR calc non Af Amer: 53 mL/min — ABNORMAL LOW (ref >60)
Glucose, Bld: 158 mg/dL — ABNORMAL HIGH (ref 70–99)
Potassium: 4 mEq/L (ref 3.5–5.1)
Sodium: 138 mEq/L (ref 135–145)

## 2011-04-26 LAB — DIFFERENTIAL
Basophils Absolute: 0 10*3/uL (ref 0.0–0.1)
Basophils Relative: 0 % (ref 0–1)
Eosinophils Absolute: 0.3 10*3/uL (ref 0.0–0.7)
Eosinophils Relative: 4 % (ref 0–5)
Lymphocytes Relative: 34 % (ref 12–46)
Lymphs Abs: 2.6 10*3/uL (ref 0.7–4.0)
Monocytes Absolute: 0.5 10*3/uL (ref 0.1–1.0)
Monocytes Relative: 7 % (ref 3–12)
Neutro Abs: 4.2 10*3/uL (ref 1.7–7.7)
Neutrophils Relative %: 55 % (ref 43–77)

## 2011-04-26 LAB — CBC
HCT: 37 % (ref 36.0–46.0)
Hemoglobin: 12.2 g/dL (ref 12.0–15.0)
MCH: 27.8 pg (ref 26.0–34.0)
MCHC: 33 g/dL (ref 30.0–36.0)
MCV: 84.3 fL (ref 78.0–100.0)
Platelets: 211 10*3/uL (ref 150–400)
RBC: 4.39 MIL/uL (ref 3.87–5.11)
RDW: 14.4 % (ref 11.5–15.5)
WBC: 7.7 10*3/uL (ref 4.0–10.5)

## 2011-04-26 LAB — POCT I-STAT TROPONIN I: Troponin i, poc: 0 ng/mL (ref 0.00–0.08)

## 2011-04-29 LAB — URINE CULTURE
Colony Count: 30000
Culture  Setup Time: 201208180755

## 2011-05-01 ENCOUNTER — Observation Stay (HOSPITAL_COMMUNITY)
Admission: EM | Admit: 2011-05-01 | Discharge: 2011-05-02 | DRG: 379 | Disposition: A | Discharge status: Home / Self Care | Payer: Medicare Other | Attending: Family Medicine | Admitting: Family Medicine

## 2011-05-01 ENCOUNTER — Emergency Department (HOSPITAL_COMMUNITY): Payer: Medicare Other

## 2011-05-01 ENCOUNTER — Encounter (HOSPITAL_COMMUNITY): Payer: Self-pay

## 2011-05-01 DIAGNOSIS — E119 Type 2 diabetes mellitus without complications: Secondary | ICD-10-CM | POA: Insufficient documentation

## 2011-05-01 DIAGNOSIS — R109 Unspecified abdominal pain: Secondary | ICD-10-CM | POA: Insufficient documentation

## 2011-05-01 DIAGNOSIS — D649 Anemia, unspecified: Secondary | ICD-10-CM | POA: Insufficient documentation

## 2011-05-01 DIAGNOSIS — I10 Essential (primary) hypertension: Secondary | ICD-10-CM | POA: Insufficient documentation

## 2011-05-01 DIAGNOSIS — M545 Low back pain, unspecified: Secondary | ICD-10-CM | POA: Insufficient documentation

## 2011-05-01 DIAGNOSIS — K625 Hemorrhage of anus and rectum: Principal | ICD-10-CM | POA: Insufficient documentation

## 2011-05-01 DIAGNOSIS — K5730 Diverticulosis of large intestine without perforation or abscess without bleeding: Secondary | ICD-10-CM | POA: Insufficient documentation

## 2011-05-01 DIAGNOSIS — K429 Umbilical hernia without obstruction or gangrene: Secondary | ICD-10-CM | POA: Insufficient documentation

## 2011-05-01 DIAGNOSIS — K8020 Calculus of gallbladder without cholecystitis without obstruction: Secondary | ICD-10-CM | POA: Insufficient documentation

## 2011-05-01 DIAGNOSIS — G8929 Other chronic pain: Secondary | ICD-10-CM | POA: Insufficient documentation

## 2011-05-01 LAB — PHOSPHORUS: Phosphorus: 3.2 mg/dL (ref 2.3–4.6)

## 2011-05-01 LAB — URINALYSIS, ROUTINE W REFLEX MICROSCOPIC
Bilirubin Urine: NEGATIVE
Glucose, UA: NEGATIVE mg/dL
Hgb urine dipstick: NEGATIVE
Ketones, ur: NEGATIVE mg/dL
Nitrite: NEGATIVE
Protein, ur: NEGATIVE mg/dL
Specific Gravity, Urine: 1.008 (ref 1.005–1.030)
Urobilinogen, UA: 0.2 mg/dL (ref 0.0–1.0)
pH: 6 (ref 5.0–8.0)

## 2011-05-01 LAB — POCT I-STAT, CHEM 8
BUN: 20 mg/dL (ref 6–23)
Calcium, Ion: 1.26 mmol/L (ref 1.12–1.32)
Chloride: 106 mEq/L (ref 96–112)
Creatinine, Ser: 0.9 mg/dL (ref 0.50–1.10)
Glucose, Bld: 156 mg/dL — ABNORMAL HIGH (ref 70–99)
HCT: 36 % (ref 36.0–46.0)
Hemoglobin: 12.2 g/dL (ref 12.0–15.0)
Potassium: 4.5 mEq/L (ref 3.5–5.1)
Sodium: 140 mEq/L (ref 135–145)
TCO2: 24 mmol/L (ref 0–100)

## 2011-05-01 LAB — DIFFERENTIAL
Basophils Absolute: 0 10*3/uL (ref 0.0–0.1)
Basophils Relative: 1 % (ref 0–1)
Eosinophils Absolute: 0.4 10*3/uL (ref 0.0–0.7)
Eosinophils Relative: 5 % (ref 0–5)
Lymphocytes Relative: 29 % (ref 12–46)
Lymphs Abs: 1.9 10*3/uL (ref 0.7–4.0)
Monocytes Absolute: 0.5 10*3/uL (ref 0.1–1.0)
Monocytes Relative: 8 % (ref 3–12)
Neutro Abs: 3.8 10*3/uL (ref 1.7–7.7)
Neutrophils Relative %: 58 % (ref 43–77)

## 2011-05-01 LAB — CBC
HCT: 36.4 % (ref 36.0–46.0)
HCT: 34.1 % — ABNORMAL LOW (ref 36.0–46.0)
Hemoglobin: 11.7 g/dL — ABNORMAL LOW (ref 12.0–15.0)
Hemoglobin: 11.2 g/dL — ABNORMAL LOW (ref 12.0–15.0)
MCH: 27.2 pg (ref 26.0–34.0)
MCH: 27.9 pg (ref 26.0–34.0)
MCHC: 32.1 g/dL (ref 30.0–36.0)
MCHC: 32.8 g/dL (ref 30.0–36.0)
MCV: 84.7 fL (ref 78.0–100.0)
MCV: 85 fL (ref 78.0–100.0)
Platelets: 178 10*3/uL (ref 150–400)
Platelets: 144 10*3/uL — ABNORMAL LOW (ref 150–400)
RBC: 4.3 MIL/uL (ref 3.87–5.11)
RBC: 4.01 MIL/uL (ref 3.87–5.11)
RDW: 13.7 % (ref 11.5–15.5)
RDW: 13.7 % (ref 11.5–15.5)
WBC: 6.5 10*3/uL (ref 4.0–10.5)
WBC: 7.2 10*3/uL (ref 4.0–10.5)

## 2011-05-01 LAB — MAGNESIUM: Magnesium: 1.6 mg/dL (ref 1.5–2.5)

## 2011-05-01 LAB — URINE MICROSCOPIC-ADD ON

## 2011-05-01 LAB — OCCULT BLOOD, POC DEVICE: Fecal Occult Bld: POSITIVE

## 2011-05-01 LAB — CARDIAC PANEL(CRET KIN+CKTOT+MB+TROPI)
CK, MB: 1.6 ng/mL (ref 0.3–4.0)
Relative Index: INVALID (ref 0.0–2.5)
Total CK: 71 U/L (ref 7–177)
Troponin I: <0.3 ng/mL (ref <0.30)

## 2011-05-01 LAB — APTT: aPTT: 33 seconds (ref 24–37)

## 2011-05-01 LAB — PROTIME-INR
INR: 1.17 (ref 0.00–1.49)
Prothrombin Time: 15.1 seconds (ref 11.6–15.2)

## 2011-05-01 LAB — GLUCOSE, CAPILLARY: Glucose-Capillary: 147 mg/dL — ABNORMAL HIGH (ref 70–99)

## 2011-05-01 LAB — SAMPLE TO BLOOD BANK

## 2011-05-01 MED ORDER — IOHEXOL 300 MG/ML  SOLN
100.0000 mL | Freq: Once | INTRAMUSCULAR | Status: AC | PRN
  Administered 2011-05-01: 100 mL via INTRAVENOUS

## 2011-05-02 DIAGNOSIS — D62 Acute posthemorrhagic anemia: Secondary | ICD-10-CM

## 2011-05-02 DIAGNOSIS — K625 Hemorrhage of anus and rectum: Secondary | ICD-10-CM

## 2011-05-02 DIAGNOSIS — K5900 Constipation, unspecified: Secondary | ICD-10-CM

## 2011-05-02 LAB — VITAMIN B12: Vitamin B-12: 358 pg/mL (ref 211–911)

## 2011-05-02 LAB — CBC
HCT: 32.4 % — ABNORMAL LOW (ref 36.0–46.0)
HCT: 33.1 % — ABNORMAL LOW (ref 36.0–46.0)
Hemoglobin: 10.5 g/dL — ABNORMAL LOW (ref 12.0–15.0)
Hemoglobin: 11.1 g/dL — ABNORMAL LOW (ref 12.0–15.0)
MCH: 27.2 pg (ref 26.0–34.0)
MCH: 27.8 pg (ref 26.0–34.0)
MCHC: 32.4 g/dL (ref 30.0–36.0)
MCHC: 33.5 g/dL (ref 30.0–36.0)
MCV: 83.9 fL (ref 78.0–100.0)
MCV: 82.8 fL (ref 78.0–100.0)
Platelets: 152 10*3/uL (ref 150–400)
Platelets: 150 10*3/uL (ref 150–400)
RBC: 3.86 MIL/uL — ABNORMAL LOW (ref 3.87–5.11)
RBC: 4 MIL/uL (ref 3.87–5.11)
RDW: 13.7 % (ref 11.5–15.5)
RDW: 13.6 % (ref 11.5–15.5)
WBC: 6.4 10*3/uL (ref 4.0–10.5)
WBC: 5.4 10*3/uL (ref 4.0–10.5)

## 2011-05-02 LAB — GLUCOSE, CAPILLARY
Glucose-Capillary: 136 mg/dL — ABNORMAL HIGH (ref 70–99)
Glucose-Capillary: 140 mg/dL — ABNORMAL HIGH (ref 70–99)

## 2011-05-02 LAB — IRON AND TIBC
Iron: 68 ug/dL (ref 42–135)
Saturation Ratios: 21 % (ref 20–55)
TIBC: 325 ug/dL (ref 250–470)
UIBC: 257 ug/dL

## 2011-05-02 LAB — CARDIAC PANEL(CRET KIN+CKTOT+MB+TROPI)
CK, MB: 1.6 ng/mL (ref 0.3–4.0)
Relative Index: INVALID (ref 0.0–2.5)
Total CK: 68 U/L (ref 7–177)
Troponin I: <0.3 ng/mL (ref <0.30)

## 2011-05-02 LAB — HEMOGLOBIN A1C
Hgb A1c MFr Bld: 7.9 % — ABNORMAL HIGH (ref <5.7)
Mean Plasma Glucose: 180 mg/dL — ABNORMAL HIGH (ref <117)

## 2011-05-02 LAB — TSH: TSH: 3.399 u[IU]/mL (ref 0.350–4.500)

## 2011-05-02 LAB — PRO B NATRIURETIC PEPTIDE: Pro B Natriuretic peptide (BNP): 92.1 pg/mL (ref 0–450)

## 2011-05-02 LAB — FERRITIN: Ferritin: 94 ng/mL (ref 10–291)

## 2011-05-03 LAB — URINE CULTURE
Colony Count: >=100000
Culture  Setup Time: 201208250110

## 2011-05-04 LAB — FOLATE RBC: RBC Folate: 1161 ng/mL — ABNORMAL HIGH (ref >=366)

## 2011-05-08 ENCOUNTER — Other Ambulatory Visit: Payer: Self-pay | Admitting: Neurosurgery

## 2011-05-08 DIAGNOSIS — M542 Cervicalgia: Secondary | ICD-10-CM

## 2011-05-08 DIAGNOSIS — M5030 Other cervical disc degeneration, unspecified cervical region: Secondary | ICD-10-CM

## 2011-05-10 ENCOUNTER — Ambulatory Visit
Admission: RE | Admit: 2011-05-10 | Discharge: 2011-05-10 | Disposition: A | Discharge status: Home / Self Care | Payer: Medicare Other | Source: Ambulatory Visit | Attending: Neurosurgery | Admitting: Neurosurgery

## 2011-05-10 DIAGNOSIS — M5030 Other cervical disc degeneration, unspecified cervical region: Secondary | ICD-10-CM

## 2011-05-10 DIAGNOSIS — M542 Cervicalgia: Secondary | ICD-10-CM

## 2011-05-18 NOTE — Discharge Summary (Signed)
  [name], DELAIR                ACCOUNT NO.:  [id number]  MEDICAL RECORD NO.:  [id number]  LOCATION:  1402                         FACILITY:  [name]  PHYSICIAN:  Richarda Overlie, MD       DATE OF BIRTH:  April 06, 1933  DATE OF ADMISSION:  05/01/2011 DATE OF DISCHARGE:  05/02/2011                              DISCHARGE SUMMARY   PRIMARY CARE PHYSICIAN:  Dr. Allena Katz.  CHIEF COMPLAINT:  Rectal bleeding.  DISCHARGE DIAGNOSES: 1. Rectal bleeding likely diverticular. 2. Type 2 diabetes. 3. Uncontrolled hypertension. 4. Normocytic anemia.  PROCEDURES: 1. GI consultation by Dr. Marina Goodell for rectal bleeding. 2. CT scan of the abdomen and pelvis shows no acute abnormality,     cholelithiasis, umbilical hernia containing fat, and sigmoid     diverticulosis.  SUBJECTIVE:  [name] who presented to the ER with a chief complaint of rectal bleeding.  She had one episode at home associated with some cramping with lower abdominal pain.  She also had some associated nausea, which was self-limiting.  The patient was admitted through the ER and was observed overnight.  During this hospitalization, she did not have any acute recurrent episodes of GI bleeding.  Her hemoglobin remained stable.  Her CT scan was consistent with sigmoid diverticulosis.  It was felt that the patient's bleeding was likely diverticular.  The patient was evaluated by the GI, Dr. Marina Goodell, prior to the discharge and the patient was found to be stable for discharge to home with outpatient colonoscopy with [facility], which will be scheduled by them.  The patient remained hemodynamically stable. In fact, she remained a little hypertension because some of her oral antihypertensive medications were held, which will be resumed prior to discharge.  PHYSICAL EXAMINATION:  VITAL SIGNS:  Blood pressure 170/82, temperature 97.5, pulse 66, respirations 18, 95% on room air. GENERAL:  Comfortable, no acute cardiopulmonary  distress. HEENT:  Pupils equal and reactive.  Extraocular movements intact. NECK:  Supple.  No JVD. LUNGS:  Clear to auscultation bilaterally.  No wheezes, no crackles, or rhonchi. CARDIOVASCULAR:  Regular rate and rhythm.  No murmurs, rubs, or gallops. ABDOMEN:  Soft, nontender, nondistended. EXTREMITIES:  Without cyanosis, clubbing, or edema. NEUROLOGIC:  Cranial nerves II-XII grossly intact.  DISCHARGE MEDICATIONS: 1. Vicodin 5/325 mg 1-2 tablets q.8 h. p.r.n. 2. Protonix 40 mg p.o. daily. 3. Clonidine 0.1 mg p.o. twice daily. 4. Klor-Con 10 mEq p.o. daily as needed. 5. Lantus 35 units subcu daily. 6. Losartan 100 mg p.o. daily. 7. Metformin 1000 mg p.o. twice daily. 8. Oxybutynin 5 mg 1 tablet p.o. twice daily. 9. Simvastatin 10 mg p.o. every other day.     Richarda Overlie, MD     NA/MEDQ  D:  05/02/2011  T:  05/02/2011  Job:  [id number]  Electronically Signed by Richarda Overlie MD on 05/18/2011 07:09:30 AM

## 2011-05-18 NOTE — H&P (Signed)
[name], [name]                ACCOUNT NO.:  [id number]  MEDICAL RECORD NO.:  [id number]  LOCATION:  WLED                         FACILITY:  [facility]  PHYSICIAN:  Richarda Overlie, MD       DATE OF BIRTH:  16-Oct-1932  DATE OF ADMISSION:  05/01/2011 DATE OF DISCHARGE:                             HISTORY & PHYSICAL   PRIMARY CARE PHYSICIAN:  Dr. Allena Katz.  CHIEF COMPLAINT:  Rectal bleeding.  SUBJECTIVE:  [name] who presents with rectal bleeding for the last 3 days  to [facility] ED at [facility] as well as for worsening of her left shoulder pain for which she was prescribed hydrocodone.    She ran out of her hydrocodone yesterday and took 2 ibuprofen tablets this morning.  The patient states that shortness of breath now resolved.  She started experiencing some low back pain 2 weeks ago.  At around 12 noon today, after she passed bright red blood per rectum, her lower back pain went away.  She also has some associated nausea this morning, but no episodes of vomiting or hematemesis.  The patient denies any chest pain ,SOB PND, . She denies any syncopal or near syncopal episodes.  She has not had any recurrent bleeding since then.  She denies any recent weight loss or weight gain.  Her last colonoscopy was more than 10 years ago.  PAST MEDICAL HISTORY: 1. Hypertension. 2. Diabetes. 3. Chronic low back pain.  REVIEW OF SYSTEMS:14 point review of systems was done and was found to be otherwise negative except what is listed in HPI .    ALLERGIES:  To codeine and Dilaudid   .  WBC 6.5, hemoglobin 12.2, hematocrit 36.0, and platelets down to 178. 1. Sodium 140, potassium 4.5, chloride 106, glucose 166, BUN 20,     creatinine 0.9. 2. Urinalysis negative except for small amount of leukocyte esterase.     Fecal occult blood is positive.  ASSESSMENT AND PLAN: 1. Rectal bleeding, likely diverticular. 2. Type 2 diabetes. 3. Hypertension, uncontrolled. 4. Normocytic  anemia.  PLAN:  The patient will be admitted because of the rectal bleeding.  We will consult Dr. Marina Goodell from [facility], GI.  We will check a PT/INR; will also check CBC every 8 hours, then if the patient continues to drop her hemoglobin or if she continues to have active rectal bleeding, then we will transfuse her with packed red blood cells.  The patient denies any history of ongoing NSAIDs, but she does take a daily aspirin, therefore, we will start  her on a b.i.d. PPI, but I doubt that the patient has upper GI bleeding as her physical examination is completely benign and her BUN is normal.  The patient has experienced some shortness of breath over the course of the last few weeks, therefore, we will do a full cardiac workup and cycle cardiac enzymes, place her on telemetry, do an EKG, and 2-D echo and then check a BNP given her multiple cardiovascular risk factors.  She is a full code.     Richarda Overlie, MD     NA/MEDQ  D:  05/01/2011  T:  05/01/2011  Job:  [id number]  Electronically Signed by Richarda Overlie MD on 05/18/2011 07:16:47 AM

## 2011-06-01 ENCOUNTER — Encounter (HOSPITAL_COMMUNITY)
Admission: RE | Admit: 2011-06-01 | Discharge: 2011-06-01 | Disposition: A | Discharge status: Home / Self Care | Payer: Medicare Other | Source: Ambulatory Visit | Attending: Neurosurgery | Admitting: Neurosurgery

## 2011-06-01 ENCOUNTER — Other Ambulatory Visit (HOSPITAL_COMMUNITY): Payer: Medicare Other

## 2011-06-01 LAB — CBC
HCT: 37.9 % (ref 36.0–46.0)
Hemoglobin: 12.8 g/dL (ref 12.0–15.0)
MCH: 27.6 pg (ref 26.0–34.0)
MCHC: 33.8 g/dL (ref 30.0–36.0)
MCV: 81.9 fL (ref 78.0–100.0)
Platelets: 200 10*3/uL (ref 150–400)
RBC: 4.63 MIL/uL (ref 3.87–5.11)
RDW: 14.3 % (ref 11.5–15.5)
WBC: 9.1 10*3/uL (ref 4.0–10.5)

## 2011-06-01 LAB — BASIC METABOLIC PANEL
BUN: 16 mg/dL (ref 6–23)
CO2: 27 mEq/L (ref 19–32)
Calcium: 10.2 mg/dL (ref 8.4–10.5)
Chloride: 106 mEq/L (ref 96–112)
Creatinine, Ser: 0.97 mg/dL (ref 0.50–1.10)
GFR calc Af Amer: >60 mL/min (ref >60)
GFR calc non Af Amer: 56 mL/min — ABNORMAL LOW (ref >60)
Glucose, Bld: 149 mg/dL — ABNORMAL HIGH (ref 70–99)
Potassium: 4.5 mEq/L (ref 3.5–5.1)
Sodium: 145 mEq/L (ref 135–145)

## 2011-06-01 LAB — SURGICAL PCR SCREEN
MRSA, PCR: NEGATIVE
Staphylococcus aureus: NEGATIVE

## 2011-06-03 ENCOUNTER — Emergency Department (HOSPITAL_COMMUNITY): Payer: Medicare Other

## 2011-06-03 ENCOUNTER — Emergency Department (HOSPITAL_COMMUNITY)
Admission: EM | Admit: 2011-06-03 | Discharge: 2011-06-03 | Disposition: A | Discharge status: Home / Self Care | Payer: Medicare Other | Attending: Emergency Medicine | Admitting: Emergency Medicine

## 2011-06-03 DIAGNOSIS — K8020 Calculus of gallbladder without cholecystitis without obstruction: Secondary | ICD-10-CM | POA: Insufficient documentation

## 2011-06-03 DIAGNOSIS — R1013 Epigastric pain: Secondary | ICD-10-CM | POA: Insufficient documentation

## 2011-06-03 DIAGNOSIS — E119 Type 2 diabetes mellitus without complications: Secondary | ICD-10-CM | POA: Insufficient documentation

## 2011-06-03 DIAGNOSIS — R112 Nausea with vomiting, unspecified: Secondary | ICD-10-CM | POA: Insufficient documentation

## 2011-06-03 DIAGNOSIS — I10 Essential (primary) hypertension: Secondary | ICD-10-CM | POA: Insufficient documentation

## 2011-06-03 DIAGNOSIS — Z794 Long term (current) use of insulin: Secondary | ICD-10-CM | POA: Insufficient documentation

## 2011-06-03 LAB — CBC
HCT: 36.6 % (ref 36.0–46.0)
Hemoglobin: 12.3 g/dL (ref 12.0–15.0)
MCH: 27.6 pg (ref 26.0–34.0)
MCHC: 33.6 g/dL (ref 30.0–36.0)
MCV: 82.2 fL (ref 78.0–100.0)
Platelets: 188 10*3/uL (ref 150–400)
RBC: 4.45 MIL/uL (ref 3.87–5.11)
RDW: 14.2 % (ref 11.5–15.5)
WBC: 7.2 10*3/uL (ref 4.0–10.5)

## 2011-06-03 LAB — COMPREHENSIVE METABOLIC PANEL
ALT: 20 U/L (ref 0–35)
AST: 34 U/L (ref 0–37)
Albumin: 3.9 g/dL (ref 3.5–5.2)
Alkaline Phosphatase: 53 U/L (ref 39–117)
BUN: 9 mg/dL (ref 6–23)
CO2: 23 mEq/L (ref 19–32)
Calcium: 9.6 mg/dL (ref 8.4–10.5)
Chloride: 101 mEq/L (ref 96–112)
Creatinine, Ser: 0.65 mg/dL (ref 0.50–1.10)
GFR calc Af Amer: >60 mL/min (ref >60)
GFR calc non Af Amer: >60 mL/min (ref >60)
Glucose, Bld: 176 mg/dL — ABNORMAL HIGH (ref 70–99)
Potassium: 3.9 mEq/L (ref 3.5–5.1)
Sodium: 137 mEq/L (ref 135–145)
Total Bilirubin: 0.5 mg/dL (ref 0.3–1.2)
Total Protein: 7.5 g/dL (ref 6.0–8.3)

## 2011-06-03 LAB — DIFFERENTIAL
Basophils Absolute: 0 10*3/uL (ref 0.0–0.1)
Basophils Relative: 0 % (ref 0–1)
Eosinophils Absolute: 0 10*3/uL (ref 0.0–0.7)
Eosinophils Relative: 1 % (ref 0–5)
Lymphocytes Relative: 17 % (ref 12–46)
Lymphs Abs: 1.3 10*3/uL (ref 0.7–4.0)
Monocytes Absolute: 0.5 10*3/uL (ref 0.1–1.0)
Monocytes Relative: 7 % (ref 3–12)
Neutro Abs: 5.4 10*3/uL (ref 1.7–7.7)
Neutrophils Relative %: 75 % (ref 43–77)

## 2011-06-03 LAB — URINALYSIS, ROUTINE W REFLEX MICROSCOPIC
Bilirubin Urine: NEGATIVE
Glucose, UA: >1000 mg/dL — AB
Ketones, ur: NEGATIVE mg/dL
Leukocytes, UA: NEGATIVE
Nitrite: NEGATIVE
Protein, ur: 100 mg/dL — AB
Specific Gravity, Urine: 1.023 (ref 1.005–1.030)
Urobilinogen, UA: 0.2 mg/dL (ref 0.0–1.0)
pH: 5.5 (ref 5.0–8.0)

## 2011-06-03 LAB — URINE MICROSCOPIC-ADD ON

## 2011-06-03 LAB — LIPASE, BLOOD: Lipase: 19 U/L (ref 11–59)

## 2011-06-09 ENCOUNTER — Inpatient Hospital Stay (HOSPITAL_COMMUNITY)
Admission: RE | Admit: 2011-06-09 | Discharge: 2011-06-13 | DRG: 473 | Disposition: A | Discharge status: Home Health | Payer: Medicare Other | Source: Ambulatory Visit | Attending: Neurosurgery | Admitting: Neurosurgery

## 2011-06-09 DIAGNOSIS — K219 Gastro-esophageal reflux disease without esophagitis: Secondary | ICD-10-CM | POA: Diagnosis present

## 2011-06-09 DIAGNOSIS — R112 Nausea with vomiting, unspecified: Secondary | ICD-10-CM | POA: Diagnosis not present

## 2011-06-09 DIAGNOSIS — Z886 Allergy status to analgesic agent status: Secondary | ICD-10-CM

## 2011-06-09 DIAGNOSIS — E119 Type 2 diabetes mellitus without complications: Secondary | ICD-10-CM | POA: Diagnosis present

## 2011-06-09 DIAGNOSIS — Z96659 Presence of unspecified artificial knee joint: Secondary | ICD-10-CM

## 2011-06-09 DIAGNOSIS — I10 Essential (primary) hypertension: Secondary | ICD-10-CM | POA: Diagnosis present

## 2011-06-09 DIAGNOSIS — M47812 Spondylosis without myelopathy or radiculopathy, cervical region: Principal | ICD-10-CM | POA: Diagnosis present

## 2011-06-09 DIAGNOSIS — M5412 Radiculopathy, cervical region: Secondary | ICD-10-CM | POA: Diagnosis present

## 2011-06-09 DIAGNOSIS — M5030 Other cervical disc degeneration, unspecified cervical region: Secondary | ICD-10-CM | POA: Diagnosis present

## 2011-06-09 DIAGNOSIS — Z85828 Personal history of other malignant neoplasm of skin: Secondary | ICD-10-CM

## 2011-06-09 LAB — GLUCOSE, CAPILLARY
Glucose-Capillary: 126 mg/dL — ABNORMAL HIGH (ref 70–99)
Glucose-Capillary: 120 mg/dL — ABNORMAL HIGH (ref 70–99)

## 2011-06-10 LAB — GLUCOSE, CAPILLARY
Glucose-Capillary: 110 mg/dL — ABNORMAL HIGH (ref 70–99)
Glucose-Capillary: 126 mg/dL — ABNORMAL HIGH (ref 70–99)
Glucose-Capillary: 142 mg/dL — ABNORMAL HIGH (ref 70–99)
Glucose-Capillary: 149 mg/dL — ABNORMAL HIGH (ref 70–99)

## 2011-06-11 LAB — GLUCOSE, CAPILLARY
Glucose-Capillary: 128 mg/dL — ABNORMAL HIGH (ref 70–99)
Glucose-Capillary: 160 mg/dL — ABNORMAL HIGH (ref 70–99)
Glucose-Capillary: 153 mg/dL — ABNORMAL HIGH (ref 70–99)
Glucose-Capillary: 127 mg/dL — ABNORMAL HIGH (ref 70–99)

## 2011-06-12 LAB — GLUCOSE, CAPILLARY
Glucose-Capillary: 126 mg/dL — ABNORMAL HIGH (ref 70–99)
Glucose-Capillary: 121 mg/dL — ABNORMAL HIGH (ref 70–99)
Glucose-Capillary: 159 mg/dL — ABNORMAL HIGH (ref 70–99)
Glucose-Capillary: 145 mg/dL — ABNORMAL HIGH (ref 70–99)

## 2011-06-12 NOTE — H&P (Signed)
  NAMEMEG, NIEMEIER                ACCOUNT NO.:  [id number]  MEDICAL RECORD NO.:  [id number]  LOCATION:  3105                         FACILITY:  [facility]  PHYSICIAN:  Hilda Lias, M.D.   DATE OF BIRTH:  09/23/1932  DATE OF ADMISSION:  06/09/2011 DATE OF DISCHARGE:                             HISTORY & PHYSICAL   Ms. Luthi is a lady who was seen in my office complaining of neck pain radiating to left upper extremity, which is getting worse.  She had been seen in the emergency room on several occasions and she was seen by me at the end of August and conservative treatment was given, but because of no improvement and in view of the finding of the x-ray, she wanted to proceed with surgery.  PAST MEDICAL HISTORY:  Lumbar diskectomy, hysterectomy, knee replacement, skin cancer surgery, and inguinal hernia.  ALLERGIES:  The patient is allergic to CODEINE and DILAUDID.  FAMILY HISTORY:  Unremarkable.  SOCIAL HISTORY:  Negative.  She does not drink or smoke.  REVIEW OF SYSTEMS:  Positive for diabetes, neck pain, high blood pressure, left upper extremity pain.  PHYSICAL EXAMINATION:  GENERAL:  The patient came to my office, and she was quite miserable.  She kept moving the left arm keeping the arm on top of the head to prevent the pain. HEAD, EARS, NOSE AND THROAT:  Normal. NECK: Almost completely frozen. LUNGS:  Clear. HEART:  Sounds are normal. ABDOMEN:  Normal. EXTREMITIES:  Normal pulse. NEUROLOGICAL:  Mental status and cranial nerves are normal.  Strength showed weakness in the left biceps and the left triceps. .  The right side is normal.  IMPRESSION:  _ddd cervical with radiculopathy at cervical4567_________ Cervical spine x-rays and MRI showed that she has loss of _disc space_________ worse in the left than the right side and worse at the 67__________.  The patient knows the risk of the surgery infection, CSF leak, worsening pain, paralysis, need for further  surgery.          ______________________________ Hilda Lias, M.D.     EB/MEDQ  D:  06/09/2011  T:  06/10/2011  Job:  [id number]  Electronically Signed by Hilda Lias M.D. on 06/12/2011 11:91:47 PM

## 2011-06-12 NOTE — Op Note (Signed)
  NAMESHABANA, ARMENTROUT                ACCOUNT NO.:  [id number]  MEDICAL RECORD NO.:  [id number]  LOCATION:  3105                         FACILITY:  [facility]  PHYSICIAN:  Hilda Lias, M.D.   DATE OF BIRTH:  [birth year]  DATE OF PROCEDURE: DATE OF DISCHARGE:                              OPERATIVE REPORT   PREOPERATIVE DIAGNOSIS:  C4-5, 5-6, and 6-7 spondylosis and stenosis with radiculopathy, chronic and acute.  POSTOPERATIVE DIAGNOSES:  C4-5, 5-6, and 6-7 spondylosis and stenosis with radiculopathy, chronic and acute.  PROCEDURE:  4-5, 5-6, and 6-7 diskectomy, decompression of the spinal cord, bilateral foraminotomy, interbody fusion with cages with autograft and DBX plate from Z6-X0, microscope.  SURGEON:  Hilda Lias, MD  ASSISTANT:  Stefani Dama, MD  CLINICAL HISTORY:  Mrs. Baldonado is a 77 year old female complaining of neck pain radiating to the left upper extremity associated with weakness and pain.  She had failed conservative treatment.  X-rays showed severe stenosis at L5-6, 6-7 with narrowing of the foramen at the level of 4-5, mostly on the left side.  In view of no improvement, the patient wants to proceed with surgery.  PROCEDURE:  The patient was taken to the OR and after intubation, the left side of the neck was cleaned with DuraPrep.  Drapes were applied. Midline incision, transversely was made through the skin, platysma down to the cervical spine.  X-rays showed that indeed we were at the L4-5. From then on, we removed a large anterior osteophyte and we entered the disk space.  We had to drill our way posteriorly.  The posterior ligament was opened and decompression of the spinal cord was achieved with foraminal decompression bilaterally.  At the level of 5-6, 6-7, which was quite worse with calcification of the posterior ligament to the point that there was almost no disk space.  We had to drill our way all the way down to posterior ligament at both  levels.  At the level of 6-7, we tried using the 1-mm Kerrison punch as well as the micro grip to get underneath the calcified ligament.  Decompression of the spinal cord with decompression of both foramens was achieved.  The left side was worse than the right side.  The same finding was essentially found at the level of 5-6 with decompression of the foramen of the spinal cord. Then, a cage of 6 mm with autograft and DBX inside, lordotic was inserted between 4-5 and at the level of 5-6 and 6-7, we had to insert at each level one of 9 mm.  Then, with the plate, we secured the area in place using several screws.  The patient was quite osteoporotic.  Nevertheless, after the plate was inserted, the x-ray showed good position of the plate as well as the screws.  The area was irrigated.  Although we achieved good hemostasis, we left a drain.  Then, the wound was closed with Vicryl and Steri-Strips.          ______________________________ Hilda Lias, M.D.     EB/MEDQ  D:  06/09/2011  T:  06/10/2011  Job:  [id number]  Electronically Signed by Hilda Lias M.D. on 06/12/2011 06:21:53 PM

## 2011-06-13 LAB — GLUCOSE, CAPILLARY: Glucose-Capillary: 153 mg/dL — ABNORMAL HIGH (ref 70–99)

## 2011-06-15 LAB — GLUCOSE, CAPILLARY: Glucose-Capillary: 150 mg/dL — ABNORMAL HIGH (ref 70–99)

## 2011-06-18 NOTE — Discharge Summary (Signed)
  NAMEJILLIAM, [name]                ACCOUNT NO.:  [id number]  MEDICAL RECORD NO.:  [id number]  LOCATION:  3010                         FACILITY:  [facility]  PHYSICIAN:  Hilda Lias, M.D.   DATE OF BIRTH:  09-09-1932  DATE OF ADMISSION:  06/09/2011 DATE OF DISCHARGE:  06/13/2011                              DISCHARGE SUMMARY   ADMISSION DIAGNOSIS:  Cervical spondylosis, cervical 4-5, 5-6, and 6-7 with chronic radiculopathy.  FINAL DIAGNOSIS:  Cervical spondylosis, cervical 4-5, 5-6, and 6-7 with chronic radiculopathy.  CLINICAL HISTORY:  The patient was admitted because of neck pain radiating to the left upper extremity.  She had failed conservative treatment.  X-ray showed degenerative disk disease with spondylosis and stenosis at the level of 4-5, 5-6, and 6-7 in the cervical spine. Surgery was advised.  Laboratory normal.  COURSE IN THE HOSPITAL:  The patient was taken to surgery, and decompression at the level of cervical 4-5 and 6-7 was done.  Today, she is doing much better.  She lives by herself.  She feels that she can go home tomorrow.  We are going to obtain home health care.  CONDITION ON DISCHARGE:  Improvement.  MEDICATION:  Vicodin, Flexeril.  DIET:  Regular.  ACTIVITY:  Not to drive for at least 2 weeks.  FOLLOWUP:  She will be seen by me in 3 weeks.          ______________________________ Hilda Lias, M.D.     EB/MEDQ  D:  06/12/2011  T:  06/13/2011  Job:  [id number]  Electronically Signed by Hilda Lias M.D. on 06/18/2011 01:08:23 PM

## 2011-06-24 LAB — POCT CARDIAC MARKERS
CKMB, poc: <1 — ABNORMAL LOW
CKMB, poc: <1 — ABNORMAL LOW
Myoglobin, poc: 106
Myoglobin, poc: 62
Operator id: 234501
Operator id: 234501
Troponin i, poc: <0.05
Troponin i, poc: <0.05

## 2011-06-24 LAB — COMPREHENSIVE METABOLIC PANEL
ALT: 14
AST: 23
Albumin: 3.9
Alkaline Phosphatase: 57
BUN: 7
CO2: 28
Calcium: 9.5
Chloride: 103
Creatinine, Ser: 0.7
GFR calc Af Amer: >60
GFR calc non Af Amer: >60
Glucose, Bld: 117 — ABNORMAL HIGH
Potassium: 3.5
Sodium: 138
Total Bilirubin: 0.8
Total Protein: 7.3

## 2011-06-24 LAB — LIPASE, BLOOD: Lipase: 18

## 2011-06-24 LAB — B-NATRIURETIC PEPTIDE (CONVERTED LAB): Pro B Natriuretic peptide (BNP): 218 — ABNORMAL HIGH

## 2011-07-07 ENCOUNTER — Ambulatory Visit (HOSPITAL_COMMUNITY)
Admission: RE | Admit: 2011-07-07 | Discharge: 2011-07-07 | Disposition: A | Discharge status: Home / Self Care | Payer: Medicare Other | Source: Ambulatory Visit | Attending: Neurosurgery | Admitting: Neurosurgery

## 2011-07-07 ENCOUNTER — Other Ambulatory Visit (HOSPITAL_COMMUNITY): Payer: Self-pay | Admitting: Neurosurgery

## 2011-07-07 DIAGNOSIS — R52 Pain, unspecified: Secondary | ICD-10-CM

## 2011-07-07 DIAGNOSIS — Z01818 Encounter for other preprocedural examination: Secondary | ICD-10-CM | POA: Insufficient documentation

## 2011-10-01 ENCOUNTER — Ambulatory Visit (INDEPENDENT_AMBULATORY_CARE_PROVIDER_SITE_OTHER): Payer: Medicare Other | Admitting: Surgery

## 2011-10-01 ENCOUNTER — Encounter (INDEPENDENT_AMBULATORY_CARE_PROVIDER_SITE_OTHER): Payer: Self-pay | Admitting: Surgery

## 2011-10-01 DIAGNOSIS — K8020 Calculus of gallbladder without cholecystitis without obstruction: Secondary | ICD-10-CM | POA: Insufficient documentation

## 2011-10-01 DIAGNOSIS — R197 Diarrhea, unspecified: Secondary | ICD-10-CM

## 2011-10-01 NOTE — Progress Notes (Signed)
Addended by: Latricia Heft on: 10/01/2011 11:02 AM   Modules accepted: Orders

## 2011-10-01 NOTE — Progress Notes (Signed)
[name] has been seen by me over the years for breast tissues as well as gallbladder issues. On first encounter was in July of 08 and she had been referred by Dr. Ignacia Palma is in shortness of breath and was felt to be in mild congestive heart failure. A workup included an ultrasound showed a 1.6 and her gallstone and no evidence of cholecystitis. 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 Her past surgery includes a hysterectomy, oophorectomy, herniated disc on the right knee replacement.  Back in September after eating pork chops she started having some issues with nausea and also diarrhea. She had to have an ER visit with vomiting and were again where her gallstone was brought into the picture. After her neck surgery in October she began having a lot of nausea which prolonged her hospitalization. She has not complained of any right upper quadrant pain per se but again does have a probably 1.6-7 cm gallstone.  She's had started the diarrhea which she describes as 4-5 times a day since September. I think I would like to get a C. Difficile toxin for that. We'll also do a HIDA scan C. If her gallbladder functions L. See her back after that.

## 2011-10-06 ENCOUNTER — Other Ambulatory Visit (INDEPENDENT_AMBULATORY_CARE_PROVIDER_SITE_OTHER): Payer: Self-pay | Admitting: Surgery

## 2011-10-07 LAB — CLOSTRIDIUM DIFFICILE EIA: CDIFTX: NEGATIVE

## 2011-10-19 ENCOUNTER — Encounter (HOSPITAL_COMMUNITY): Payer: Self-pay

## 2011-10-19 ENCOUNTER — Encounter (HOSPITAL_COMMUNITY)
Admission: RE | Admit: 2011-10-19 | Discharge: 2011-10-19 | Disposition: A | Discharge status: Home / Self Care | Payer: Medicare Other | Source: Ambulatory Visit | Attending: Surgery | Admitting: Surgery

## 2011-10-19 DIAGNOSIS — K828 Other specified diseases of gallbladder: Secondary | ICD-10-CM | POA: Insufficient documentation

## 2011-10-19 DIAGNOSIS — R109 Unspecified abdominal pain: Secondary | ICD-10-CM | POA: Insufficient documentation

## 2011-10-19 DIAGNOSIS — K8020 Calculus of gallbladder without cholecystitis without obstruction: Secondary | ICD-10-CM

## 2011-10-19 DIAGNOSIS — R197 Diarrhea, unspecified: Secondary | ICD-10-CM

## 2011-10-19 MED ORDER — TECHNETIUM TC 99M MEBROFENIN IV KIT
5.5000 | PACK | Freq: Once | INTRAVENOUS | Status: AC | PRN
  Administered 2011-10-19: 5.5 via INTRAVENOUS

## 2011-10-19 MED ORDER — SINCALIDE 5 MCG IJ SOLR: 0.0200 ug/kg | Freq: Once | INTRAMUSCULAR | Status: DC

## 2011-10-23 ENCOUNTER — Encounter (INDEPENDENT_AMBULATORY_CARE_PROVIDER_SITE_OTHER): Payer: Self-pay | Admitting: Surgery

## 2011-10-23 ENCOUNTER — Ambulatory Visit (INDEPENDENT_AMBULATORY_CARE_PROVIDER_SITE_OTHER): Payer: Medicare Other | Admitting: Surgery

## 2011-10-23 DIAGNOSIS — K8020 Calculus of gallbladder without cholecystitis without obstruction: Secondary | ICD-10-CM | POA: Insufficient documentation

## 2011-10-23 NOTE — Progress Notes (Signed)
[name] comes in today in followup. Her C. Difficile was negative. Her HIDA scan showed a patent cystic duct and an ejection fraction of 14%. Her nausea and vomiting is returned into issues of diarrhea. I think that he would be worth treating her with probiotic so see if the problem is the bacterial population and cut. We will try that and if in 6 weeks is not better then we could refer her for a laparoscopic cholecystectomy.

## 2011-12-07 ENCOUNTER — Encounter (INDEPENDENT_AMBULATORY_CARE_PROVIDER_SITE_OTHER): Payer: Self-pay | Admitting: Surgery

## 2011-12-09 ENCOUNTER — Encounter (INDEPENDENT_AMBULATORY_CARE_PROVIDER_SITE_OTHER): Payer: Medicare Other | Admitting: Surgery

## 2013-05-03 ENCOUNTER — Encounter: Payer: Self-pay | Admitting: Neurology

## 2013-05-03 ENCOUNTER — Ambulatory Visit (INDEPENDENT_AMBULATORY_CARE_PROVIDER_SITE_OTHER): Payer: Medicare Other | Admitting: Neurology

## 2013-05-03 VITALS — BP 174/80 | HR 109 | Ht 68.25 in | Wt 242.0 lb

## 2013-05-03 DIAGNOSIS — G250 Essential tremor: Secondary | ICD-10-CM

## 2013-05-03 MED ORDER — PRIMIDONE 50 MG PO TABS: ORAL_TABLET | ORAL | Status: DC

## 2013-05-03 NOTE — Progress Notes (Signed)
Reason for visit: Tremor  [name] is a 79 y.o. female  History of present illness:  [name] is a 79 year old right-handed white female with a history of a tremor that she began noticing about 2 months ago. The patient indicates that the tremor primarily affects her right arm, but the left arm is also affected. In retrospect, the patient has noted a mild vocal tremor that has been present for least 2 years, gradually worsening. At times, she will have a head tremor as well. The patient goes on to say that she has multiple family members who had tremors. The patient has a maternal aunt with a similar tremor, and she has 2 daughters with tremors. Her mother did not have tremor, but she died when she was 61. The patient does have some mild gait instability, and she has not fallen recently. The patient does not use a cane for ambulation. The patient does have numbness of the feet and some burning in the feet at times associated with her diabetes. The patient takes gabapentin for this. The patient denies weakness of the extremities, but she does have some urinary urgency and occasional incontinence. The patient is concerned about the tremor, as it affects her ability to feed herself, and her ability to perform handwriting. The patient indicates that the tremors are a problem for her on a daily basis. The patient is not restricted from any activities because of the tremor. The patient is sent to this office for an evaluation.  Past Medical History  Diagnosis Date  . Diabetes mellitus   . Hypertension   . Hyperlipemia   . Arthritis   . Cancer     skin  . Staph infection   . Essential and other specified forms of tremor 05/03/2013  . Cholelithiasis   . Chronic insomnia   . Chronic low back pain   . Obesity   . Peripheral edema   . Diabetic peripheral neuropathy     Past Surgical History  Procedure Laterality Date  . Abdominal hysterectomy    . Hernia repair    . Total knee  arthroplasty    . Back surgery    . Cataract extraction    . Oophorectomy    . Neck surgery      Family History  Problem Relation Age of Onset  . Cancer Brother     prostate  . Heart disease Brother   . Alzheimer's disease Brother   . Stroke Mother   . Hypertension Mother   . COPD Brother   . Cancer Brother     Prostate cancer  . Hypertension Sister     Congestive heart failure    Social history:  reports that she has never smoked. She has never used smokeless tobacco. She reports that she does not drink alcohol or use illicit drugs.  Medications:  Current Outpatient Prescriptions on File Prior to Visit  Medication Sig Dispense Refill  . cloNIDine (CATAPRES) 0.1 MG tablet Take 0.1 mg by mouth 2 (two) times daily.        [name] gabapentin (NEURONTIN) 300 MG capsule Three times a day.      [name] HYDROcodone-acetaminophen (NORCO) 10-325 MG per tablet Take 1 tablet by mouth every 6 (six) hours as needed.      . insulin glargine (LANTUS) 100 UNIT/ML injection Inject 65 Units into the skin daily after breakfast.       . losartan (COZAAR) 100 MG tablet Take 100 mg by mouth daily.        [name]  metFORMIN (GLUCOPHAGE) 1000 MG tablet Take 1,000 mg by mouth 2 (two) times daily with a meal.        . omega-3 acid ethyl esters (LOVAZA) 1 G capsule Take 1 g by mouth 2 (two) times daily.        . pravastatin (PRAVACHOL) 40 MG tablet daily.       No current facility-administered medications on file prior to visit.      Allergies  Allergen Reactions  . Codeine Nausea And Vomiting  . Dilaudid [Hydromorphone Hcl] Nausea And Vomiting and Other (See Comments)    Severe sweating    ROS:  Out of a complete 14 system review of symptoms, the patient complains only of the following symptoms, and all other reviewed systems are negative.  Swelling the legs Shortness of breath Incontinence of bladder Memory loss Tremor Gait instability  Blood pressure 174/80, pulse 109, height 5' 8.25" (1.734 m),  weight 242 lb (109.77 kg).  Physical Exam  General: The patient is alert and cooperative at the time of the examination. The patient is moderately obese.  Head: Pupils are equal, round, and reactive to light. Discs are flat bilaterally. An occasional "head nod" tremor is seen of the head and neck.  Neck: The neck is supple, no carotid bruits are noted.  Respiratory: The respiratory examination is clear.  Cardiovascular: The cardiovascular examination reveals a regular rate and rhythm, no obvious murmurs or rubs are noted.  Skin: Extremities are with 3+ edema below the knees bilaterally.  Neurologic Exam  Mental status:  Cranial nerves: Facial symmetry is present. There is good sensation of the face to pinprick and soft touch bilaterally. The strength of the facial muscles and the muscles to head turning and shoulder shrug are normal bilaterally. Speech is well enunciated, no aphasia or dysarthria is noted. Extraocular movements are full. Visual fields are full. The vocal tremor is present.  Motor: The motor testing reveals 5 over 5 strength of all 4 extremities. Good symmetric motor tone is noted throughout.  Sensory: Sensory testing is intact to pinprick, soft touch, vibration sensation, and position sense on all 4 extremities, with the exception that there is a decrease in vibration sensation in both feet. A definite stocking pattern pinprick sensory deficit is not present. No evidence of extinction is noted.  Coordination: Cerebellar testing reveals good finger-nose-finger and heel-to-shin bilaterally. The patient has an intention tremor seen bilaterally, slightly more prominent with the right side.  Gait and station: Gait is slightly wide-based. With ambulation, the patient has a slight decrease in arm swing on the right as compared to the left, but arm swing is present bilaterally. The patient is unable to arise from a seated position with the arms crossed. Tandem gait is unsteady.  Romberg is negative. No drift is seen.  Reflexes: Deep tendon reflexes are symmetric, but are depressed bilaterally. Toes are downgoing bilaterally.   Assessment/Plan:  One. Benign essential tremor  2. Diabetes  The patient appears to have features consistent with an essential tremor. The patient has a vocal tremor that has been present for 2 years, a head and neck tremor, and tremors of both arms, slightly more prominent on the right than the left. The patient has a family history of tremor. At this point, the patient indicates that she wishes to be on medication on a daily basis to help the tremor. I will initiate Mysoline at this time. The patient will followup through this office in 6 months. If the patient does  not tolerate the medication, she is to contact our office. If a thyroid profile has not been done recently, I would recommend that this be ordered.  Marlan Palau MD 05/03/2013 12:47 PM  [facility] [address] [place], [place] [address]  Phone [phone] Fax [phone]

## 2013-05-05 ENCOUNTER — Telehealth: Payer: Self-pay | Admitting: Neurology

## 2013-05-09 NOTE — Telephone Encounter (Signed)
I called the patient. The Mysoline caused a lot of dizziness and nausea. The patient has stopped the medication after only one tablet. The patient does not wish to go on any medications for her tremor at this point. The patient will contact our office if she wishes to try another pill, and we will consider clonazepam or alprazolam.

## 2013-05-09 NOTE — Telephone Encounter (Signed)
Spoke to patient and she took one dose of Mysoline and said it caused her mind to race, and she had vomiting.  She did not take any more medicine and it took her until yesterday to feel better.   [phone]

## 2013-06-14 ENCOUNTER — Emergency Department (HOSPITAL_COMMUNITY)
Admission: EM | Admit: 2013-06-14 | Discharge: 2013-06-14 | Disposition: A | Discharge status: Home / Self Care | Payer: Medicare Other | Attending: Emergency Medicine | Admitting: Emergency Medicine

## 2013-06-14 ENCOUNTER — Emergency Department (HOSPITAL_COMMUNITY): Payer: Medicare Other

## 2013-06-14 DIAGNOSIS — Y9289 Other specified places as the place of occurrence of the external cause: Secondary | ICD-10-CM | POA: Insufficient documentation

## 2013-06-14 DIAGNOSIS — G8929 Other chronic pain: Secondary | ICD-10-CM | POA: Insufficient documentation

## 2013-06-14 DIAGNOSIS — Z85828 Personal history of other malignant neoplasm of skin: Secondary | ICD-10-CM | POA: Insufficient documentation

## 2013-06-14 DIAGNOSIS — W010XXA Fall on same level from slipping, tripping and stumbling without subsequent striking against object, initial encounter: Secondary | ICD-10-CM | POA: Insufficient documentation

## 2013-06-14 DIAGNOSIS — S8000XA Contusion of unspecified knee, initial encounter: Secondary | ICD-10-CM | POA: Insufficient documentation

## 2013-06-14 DIAGNOSIS — Z8719 Personal history of other diseases of the digestive system: Secondary | ICD-10-CM | POA: Insufficient documentation

## 2013-06-14 DIAGNOSIS — E1149 Type 2 diabetes mellitus with other diabetic neurological complication: Secondary | ICD-10-CM | POA: Insufficient documentation

## 2013-06-14 DIAGNOSIS — S62002A Unspecified fracture of navicular [scaphoid] bone of left wrist, initial encounter for closed fracture: Secondary | ICD-10-CM

## 2013-06-14 DIAGNOSIS — I10 Essential (primary) hypertension: Secondary | ICD-10-CM | POA: Insufficient documentation

## 2013-06-14 DIAGNOSIS — E785 Hyperlipidemia, unspecified: Secondary | ICD-10-CM | POA: Insufficient documentation

## 2013-06-14 DIAGNOSIS — Z794 Long term (current) use of insulin: Secondary | ICD-10-CM | POA: Insufficient documentation

## 2013-06-14 DIAGNOSIS — S6990XA Unspecified injury of unspecified wrist, hand and finger(s), initial encounter: Secondary | ICD-10-CM | POA: Insufficient documentation

## 2013-06-14 DIAGNOSIS — S59909A Unspecified injury of unspecified elbow, initial encounter: Secondary | ICD-10-CM | POA: Insufficient documentation

## 2013-06-14 DIAGNOSIS — E1142 Type 2 diabetes mellitus with diabetic polyneuropathy: Secondary | ICD-10-CM | POA: Insufficient documentation

## 2013-06-14 DIAGNOSIS — Z79899 Other long term (current) drug therapy: Secondary | ICD-10-CM | POA: Insufficient documentation

## 2013-06-14 DIAGNOSIS — Z8739 Personal history of other diseases of the musculoskeletal system and connective tissue: Secondary | ICD-10-CM | POA: Insufficient documentation

## 2013-06-14 DIAGNOSIS — W102XXA Fall (on)(from) incline, initial encounter: Secondary | ICD-10-CM

## 2013-06-14 DIAGNOSIS — Z8669 Personal history of other diseases of the nervous system and sense organs: Secondary | ICD-10-CM | POA: Insufficient documentation

## 2013-06-14 DIAGNOSIS — Z8619 Personal history of other infectious and parasitic diseases: Secondary | ICD-10-CM | POA: Insufficient documentation

## 2013-06-14 DIAGNOSIS — E669 Obesity, unspecified: Secondary | ICD-10-CM | POA: Insufficient documentation

## 2013-06-14 DIAGNOSIS — Y939 Activity, unspecified: Secondary | ICD-10-CM | POA: Insufficient documentation

## 2013-06-14 DIAGNOSIS — S8002XA Contusion of left knee, initial encounter: Secondary | ICD-10-CM

## 2013-06-14 DIAGNOSIS — S62009A Unspecified fracture of navicular [scaphoid] bone of unspecified wrist, initial encounter for closed fracture: Secondary | ICD-10-CM | POA: Insufficient documentation

## 2013-06-14 LAB — BASIC METABOLIC PANEL
BUN: 28 mg/dL — ABNORMAL HIGH (ref 6–23)
CO2: 21 mEq/L (ref 19–32)
Calcium: 9.5 mg/dL (ref 8.4–10.5)
Chloride: 105 mEq/L (ref 96–112)
Creatinine, Ser: 1.06 mg/dL (ref 0.50–1.10)
GFR calc Af Amer: 57 mL/min — ABNORMAL LOW (ref >90)
GFR calc non Af Amer: 49 mL/min — ABNORMAL LOW (ref >90)
Glucose, Bld: 188 mg/dL — ABNORMAL HIGH (ref 70–99)
Potassium: 4.1 mEq/L (ref 3.5–5.1)
Sodium: 139 mEq/L (ref 135–145)

## 2013-06-14 LAB — CBC WITH DIFFERENTIAL/PLATELET
Basophils Absolute: 0 10*3/uL (ref 0.0–0.1)
Basophils Relative: 0 % (ref 0–1)
Eosinophils Absolute: 0.1 10*3/uL (ref 0.0–0.7)
Eosinophils Relative: 1 % (ref 0–5)
HCT: 32.7 % — ABNORMAL LOW (ref 36.0–46.0)
Hemoglobin: 11 g/dL — ABNORMAL LOW (ref 12.0–15.0)
Lymphocytes Relative: 19 % (ref 12–46)
Lymphs Abs: 1.4 10*3/uL (ref 0.7–4.0)
MCH: 28 pg (ref 26.0–34.0)
MCHC: 33.6 g/dL (ref 30.0–36.0)
MCV: 83.2 fL (ref 78.0–100.0)
Monocytes Absolute: 0.4 10*3/uL (ref 0.1–1.0)
Monocytes Relative: 6 % (ref 3–12)
Neutro Abs: 5.7 10*3/uL (ref 1.7–7.7)
Neutrophils Relative %: 75 % (ref 43–77)
Platelets: 185 10*3/uL (ref 150–400)
RBC: 3.93 MIL/uL (ref 3.87–5.11)
RDW: 14 % (ref 11.5–15.5)
WBC: 7.6 10*3/uL (ref 4.0–10.5)

## 2013-06-14 MED ORDER — TRAMADOL HCL 50 MG PO TABS: 50.0000 mg | ORAL_TABLET | Freq: Four times a day (QID) | ORAL | Status: DC | PRN

## 2013-06-14 NOTE — ED Notes (Signed)
Pt was at [facility] when she tripped on the curb and fell on the asaphalt. R knee, L elbow and wrist pain. Abrasion on L elbow. No LOC. No blood thinners. Neuro intact. Hypertensive.

## 2013-06-14 NOTE — ED Notes (Signed)
Bed: [facility] Expected date:  Expected time:  Means of arrival:  Comments: ems- 80 fall, no LOC

## 2013-06-14 NOTE — ED Provider Notes (Signed)
CSN: [id number]     Arrival date & time 06/14/13  1633 History   First MD Initiated Contact with Patient 06/14/13 1657     Chief Complaint  Patient presents with  . Fall   (Consider location/radiation/quality/duration/timing/severity/associated sxs/prior Treatment) HPI  [name] is a 79 y.o.female with a significant PMH of diabetes, hypertension, arthritis, obesity, knee replacement right  presents to the ER with complaints of fall with left wrist and elbow pain and right knee pain. She was stepping off of the curb at [facility]  when she tripped and landed on her right knee and left wrist. She is now having pains. She is not on blood thinners, denies hitting her head, LOC, neck pain. She denies having pains anywhere else. She has no obvious deformities and no significant skin wounds. She declines pain medication but agrees to applying ice.    Past Medical History  Diagnosis Date  . Diabetes mellitus   . Hypertension   . Hyperlipemia   . Arthritis   . Cancer     skin  . Staph infection   . Essential and other specified forms of tremor 05/03/2013  . Cholelithiasis   . Chronic insomnia   . Chronic low back pain   . Obesity   . Peripheral edema   . Diabetic peripheral neuropathy    Past Surgical History  Procedure Laterality Date  . Abdominal hysterectomy    . Hernia repair    . Total knee arthroplasty    . Back surgery    . Cataract extraction    . Oophorectomy    . Neck surgery     Family History  Problem Relation Age of Onset  . Cancer Brother     prostate  . Heart disease Brother   . Alzheimer's disease Brother   . Stroke Mother   . Hypertension Mother   . COPD Brother   . Cancer Brother     Prostate cancer  . Hypertension Sister     Congestive heart failure   History  Substance Use Topics  . Smoking status: Never Smoker   . Smokeless tobacco: Never Used  . Alcohol Use: No   OB History   Grav Para Term Preterm Abortions TAB SAB Ect Mult Living          Review of Systems ROS: No TIA's or unusual headaches, no dysphagia.  No prolonged cough. No dyspnea or chest pain on exertion.  No abdominal pain, change in bowel habits, black or bloody stools.  No urinary tract symptoms.  No new or unusual musculoskeletal symptoms.  Normal menses, no abnormal vaginal bleeding, discharge or unexpected pelvic pain. No new breast lumps, breast pain or nipple discharge.  Allergies  Codeine; Dilaudid; and Mysoline  Home Medications   Current Outpatient Rx  Name  Route  Sig  Dispense  Refill  . chlorthalidone (HYGROTON) 25 MG tablet   Oral   Take 25 mg by mouth daily.         . cloNIDine (CATAPRES) 0.1 MG tablet   Oral   Take 0.1 mg by mouth 2 (two) times daily.           [name] gabapentin (NEURONTIN) 400 MG capsule   Oral   Take 400 mg by mouth 2 (two) times daily between meals as needed.         . insulin glargine (LANTUS) 100 UNIT/ML injection   Subcutaneous   Inject 65 Units into the skin at bedtime.          [name]  losartan (COZAAR) 100 MG tablet   Oral   Take 100 mg by mouth daily.           . metFORMIN (GLUCOPHAGE) 1000 MG tablet   Oral   Take 1,000 mg by mouth 2 (two) times daily with a meal.           . pravastatin (PRAVACHOL) 40 MG tablet   Oral   Take 40 mg by mouth at bedtime.          [name] spironolactone (ALDACTONE) 50 MG tablet   Oral   Take 50 mg by mouth daily.         . traMADol (ULTRAM) 50 MG tablet   Oral   Take 1 tablet (50 mg total) by mouth every 6 (six) hours as needed for pain.   15 tablet   0    BP 149/71  Pulse 104  Temp(Src) 98.4 F (36.9 C) (Oral)  Resp 16  SpO2 98% Physical Exam  Nursing note and vitals reviewed. Constitutional: She appears well-developed and well-nourished. No distress.  HENT:  Head: Normocephalic and atraumatic.  Eyes: Pupils are equal, round, and reactive to light.  Neck: Normal range of motion. Neck supple.  Cardiovascular: Normal rate and regular rhythm.     Pulmonary/Chest: Effort normal.  Abdominal: Soft.  Musculoskeletal:       Left elbow: She exhibits decreased range of motion and swelling. She exhibits no effusion, no deformity and no laceration. Tenderness found.       Left wrist: She exhibits decreased range of motion, tenderness and bony tenderness. She exhibits no swelling, no effusion, no crepitus, no deformity and no laceration.       Right knee: She exhibits decreased range of motion and swelling. She exhibits no effusion, no deformity, no laceration and no bony tenderness. Tenderness (diffuse) found.       Arms:      Legs: Neurological: She is alert.  Skin: Skin is warm and dry.    ED Course  Procedures (including critical care time) Labs Review Labs Reviewed  CBC WITH DIFFERENTIAL - Abnormal; Notable for the following:    Hemoglobin 11.0 (*)    HCT 32.7 (*)    All other components within normal limits  BASIC METABOLIC PANEL - Abnormal; Notable for the following:    Glucose, Bld 188 (*)    BUN 28 (*)    GFR calc non Af Amer 49 (*)    GFR calc Af Amer 57 (*)    All other components within normal limits   Imaging Review Dg Elbow Complete Left  06/14/2013   CLINICAL DATA:  Traumatic injury with elbow pain  EXAM: LEFT ELBOW - COMPLETE 3+ VIEW  COMPARISON:  None.  FINDINGS: Mild osteophytic changes are noted particularly arising from the olecranon. No joint effusion is seen. No acute fracture or dislocation is noted.  IMPRESSION: Degenerative change without acute abnormality.   Electronically Signed   By: Alcide Clever M.D.   On: 06/14/2013 17:55   Dg Wrist Complete Left  06/14/2013   CLINICAL DATA:  Traumatic injury with pain  EXAM: LEFT WRIST - COMPLETE 3+ VIEW  COMPARISON:  None.  FINDINGS: Mild degenerative changes are noted at the 1st carpometacarpal articulation. Mild irregularity of the scaphoid is noted on to of the four views. No significant soft tissue changes are noted. This change may be chronic in nature.   IMPRESSION: Suspicious findings in the scaphoid bone. Correlation to snuff box tenderness is recommended.  Electronically Signed   By: Alcide Clever M.D.   On: 06/14/2013 17:56   Dg Knee Complete 4 Views Right  06/14/2013   CLINICAL DATA:  Recent traumatic injury with pain  EXAM: RIGHT KNEE - COMPLETE 4+ VIEW  COMPARISON:  09/12/2005  FINDINGS: There are changes consistent with a total knee prosthesis. No loosening or acute fracture is noted. No soft tissue changes are seen.  IMPRESSION: No acute abnormality is identified.   Electronically Signed   By: Alcide Clever M.D.   On: 06/14/2013 17:52    MDM   1. Scaphoid fracture of wrist, left, closed, initial encounter   2. Fall (on)(from) incline, initial encounter   3. Knee contusion, left, initial encounter    Dr. Romeo Apple has seen patient as well. She has tenderness to her scaphoid region, will treat as fracture and put in thumb spica splint. Tramadol for pain, remaining xrays are not acute.  79 y.o.Leianna Barga Timberman's evaluation in the Emergency Department is complete. It has been determined that no acute conditions requiring further emergency intervention are present at this time. The patient/guardian have been advised of the diagnosis and plan. We have discussed signs and symptoms that warrant return to the ED, such as changes or worsening in symptoms.  Vital signs are stable at discharge. Filed Vitals:   06/14/13 1651  BP: 149/71  Pulse: 104  Temp: 98.4 F (36.9 C)  Resp: 16    Patient/guardian has voiced understanding and agreed to follow-up with the PCP or specialist.    Dorthula Matas, PA-C 06/14/13 1853

## 2013-06-15 NOTE — ED Provider Notes (Signed)
Medical screening examination/treatment/procedure(s) were conducted as a shared visit with non-physician practitioner(s) and myself.  I personally evaluated the patient during the encounter  I interviewed and examined the patient. Lungs are CTAB. Cardiac exam wnl. Abdomen soft. Reviewed imaging. Will splint in thumb spica and rec f/u.   Junius Argyle, MD 06/15/13 1314

## 2013-08-11 DIAGNOSIS — Z96659 Presence of unspecified artificial knee joint: Secondary | ICD-10-CM | POA: Insufficient documentation

## 2013-08-17 ENCOUNTER — Encounter: Payer: Self-pay | Admitting: Neurology

## 2013-08-23 ENCOUNTER — Encounter (INDEPENDENT_AMBULATORY_CARE_PROVIDER_SITE_OTHER): Payer: Self-pay

## 2013-08-23 ENCOUNTER — Ambulatory Visit (INDEPENDENT_AMBULATORY_CARE_PROVIDER_SITE_OTHER): Payer: Medicare Other | Admitting: Neurology

## 2013-08-23 ENCOUNTER — Encounter: Payer: Self-pay | Admitting: Neurology

## 2013-08-23 VITALS — BP 195/78 | HR 120 | Wt 243.0 lb

## 2013-08-23 DIAGNOSIS — G250 Essential tremor: Secondary | ICD-10-CM

## 2013-08-23 NOTE — Progress Notes (Signed)
Reason for visit: Tremor  [name] is an 79 y.o. female  History of present illness:  [name] is a 79 year old right-handed white female with a history of an essential tremor. The patient has a vocal tremor, and tremor in both upper extremities, possibly worse on the right side. The patient has noted onset of a jaw tremor that generally occurs at night, and for some reason tends to occur only when she is laying on her left side. The patient indicates that the tremor may wake her up at night. The tremor she claims it is violent, and it will go away when she sits up. The patient does not have a jaw tremor during the day. The patient does have some mild gait instability, and she fell in October 2014 with a left wrist fracture that did not require surgery. The patient has recovered from this. The patient was given a trial on Mysoline when she was seen last through this office, but she did not tolerate this medication. The patient is on no medications for her tremor at this time.  Past Medical History  Diagnosis Date  . Diabetes mellitus   . Hypertension   . Hyperlipemia   . Arthritis   . Cancer     skin  . Staph infection   . Essential and other specified forms of tremor 05/03/2013  . Cholelithiasis   . Chronic insomnia   . Chronic low back pain   . Obesity   . Peripheral edema   . Diabetic peripheral neuropathy   . Wrist fracture     left October 2014    Past Surgical History  Procedure Laterality Date  . Abdominal hysterectomy    . Hernia repair    . Total knee arthroplasty    . Back surgery    . Cataract extraction    . Oophorectomy    . Neck surgery      Family History  Problem Relation Age of Onset  . Cancer Brother     prostate  . Heart disease Brother   . Alzheimer's disease Brother   . Stroke Mother   . Hypertension Mother   . COPD Brother   . Cancer Brother     Prostate cancer  . Hypertension Sister     Congestive heart failure    Social  history:  reports that she has never smoked. She has never used smokeless tobacco. She reports that she does not drink alcohol or use illicit drugs.    Allergies  Allergen Reactions  . Codeine Nausea And Vomiting  . Dilaudid [Hydromorphone Hcl] Nausea And Vomiting and Other (See Comments)    Severe sweating  . Mysoline [Primidone]     Nausea    Medications:  Current Outpatient Prescriptions on File Prior to Visit  Medication Sig Dispense Refill  . chlorthalidone (HYGROTON) 25 MG tablet Take 25 mg by mouth daily.      . cloNIDine (CATAPRES) 0.1 MG tablet Take 0.1 mg by mouth 2 (two) times daily.        . insulin glargine (LANTUS) 100 UNIT/ML injection Inject 65 Units into the skin at bedtime.       [name] losartan (COZAAR) 100 MG tablet Take 100 mg by mouth daily.        . metFORMIN (GLUCOPHAGE) 1000 MG tablet Take 1,000 mg by mouth 2 (two) times daily with a meal.        . pravastatin (PRAVACHOL) 40 MG tablet Take 40 mg by  mouth at bedtime.       [name] spironolactone (ALDACTONE) 50 MG tablet Take 50 mg by mouth daily.       No current facility-administered medications on file prior to visit.    ROS:  Out of a complete 14 system review of symptoms, the patient complains only of the following symptoms, and all other reviewed systems are negative.    Runny nose  Incontinence of the bladder Joint pain Tremors  Blood pressure 195/78, pulse 120, weight 243 lb (110.224 kg).  Physical Exam  General: The patient is alert and cooperative at the time of the examination. The patient is moderately to markedly obese.  Skin: 3+ edema below the knees is noted bilaterally.   Neurologic Exam  Mental status: The patient is oriented x 3.  Cranial nerves: Facial symmetry is present. Speech is normal, no aphasia or dysarthria is noted. Extraocular movements are full. Visual fields are full. A vocal tremor is noted. No jaw tremor is seen.  Motor: The patient has good strength in all 4  extremities.  Sensory examination: Soft touch sensation on the face, arms, and legs is symmetric.  Coordination: The patient has good finger-nose-finger and heel-to-shin bilaterally. The patient has an intention tremor with finger-nose-finger bilaterally, slightly worse on the right.  Gait and station: The patient has a slightly wide-based gait. Tandem gait is unsteady. Romberg is negative. No drift is seen.  Reflexes: Deep tendon reflexes are symmetric, but are depressed.   Assessment/Plan:  1. Benign essential tremor  2. Gait disorder  The patient reports a new episode of jaw tremor that occurs under unusual circumstances, usually at night, while the patient is laying on her left side. Certainly, the essential tremors may result in a head and neck and jaw tremor. The patient will be set up for CT evaluation of the brain. The patient will followup through this office in 4 or 5 months. If the jaw tremors become more significant, low-dose alprazolam at night may be helpful. The patient has had only about 5 episodes since October of 2014.  Marlan Palau MD 08/23/2013 8:09 PM  [facility] [address] [place], [place] [address]  Phone [phone] Fax [phone]

## 2013-08-23 NOTE — Patient Instructions (Signed)
Tremor  Tremor is a rhythmic, involuntary muscular contraction characterized by oscillations (to-and-fro movements) of a part of the body. The most common of all involuntary movements, tremor can affect various body parts such as the hands, head, facial structures, vocal cords, trunk, and legs; most tremors, however, occur in the hands. Tremor often accompanies neurological disorders associated with aging. Although the disorder is not life-threatening, it can be responsible for functional disability and social embarrassment.  TREATMENT   There are many types of tremor and several ways in which tremor is classified. The most common classification is by behavioral context or position. There are five categories of tremor within this classification: resting, postural, kinetic, task-specific, and psychogenic. Resting or static tremor occurs when the muscle is at rest, for example when the hands are lying on the lap. This type of tremor is often seen in patients with Parkinson's disease. Postural tremor occurs when a patient attempts to maintain posture, such as holding the hands outstretched. Postural tremors include physiological tremor, essential tremor, tremor with basal ganglia disease (also seen in patients with Parkinson's disease), cerebellar postural tremor, tremor with peripheral neuropathy, post-traumatic tremor, and alcoholic tremor. Kinetic or intention (action) tremor occurs during purposeful movement, for example during finger-to-nose testing. Task-specific tremor appears when performing goal-oriented tasks such as handwriting, speaking, or standing. This group consists of primary writing tremor, vocal tremor, and orthostatic tremor. Psychogenic tremor occurs in both older and younger patients. The key feature of this tremor is that it dramatically lessens or disappears when the patient is distracted.  PROGNOSIS  There are some treatment options available for tremor; the appropriate treatment depends on  accurate diagnosis of the cause. Some tremors respond to treatment of the underlying condition, for example in some cases of psychogenic tremor treating the patient's underlying mental problem may cause the tremor to disappear. Also, patients with tremor due to Parkinson's disease may be treated with Levodopa drug therapy. Symptomatic drug therapy is available for several other tremors as well. For those cases of tremor in which there is no effective drug treatment, physical measures such as teaching the patient to brace the affected limb during the tremor are sometimes useful. Surgical intervention such as thalamotomy or deep brain stimulation may be useful in certain cases.  Document Released: 08/14/2002 Document Revised: 11/16/2011 Document Reviewed: 08/24/2005  Exit[employer]® Patient Information ©2014 [employer].

## 2013-09-11 ENCOUNTER — Ambulatory Visit
Admission: RE | Admit: 2013-09-11 | Discharge: 2013-09-11 | Disposition: A | Discharge status: Home / Self Care | Payer: Medicare Other | Source: Ambulatory Visit | Attending: Neurology | Admitting: Neurology

## 2013-09-11 ENCOUNTER — Telehealth: Payer: Self-pay | Admitting: Neurology

## 2013-09-11 DIAGNOSIS — G250 Essential tremor: Secondary | ICD-10-CM

## 2013-09-11 DIAGNOSIS — G252 Other specified forms of tremor: Secondary | ICD-10-CM

## 2013-09-11 NOTE — Telephone Encounter (Signed)
I called the patient. The CT scan of brain was unremarkable. I discussed this with her.

## 2013-10-09 ENCOUNTER — Telehealth: Payer: Self-pay | Admitting: Neurology

## 2013-10-09 MED ORDER — PROPRANOLOL HCL 10 MG PO TABS: ORAL_TABLET | ORAL | Status: DC

## 2013-10-09 NOTE — Telephone Encounter (Signed)
Patient is requesting a Rx for Xanax so she can take it for her tremors.  Please advise.  Thank you.

## 2013-10-09 NOTE — Telephone Encounter (Signed)
Patient calling to request that something be called in for her hand tremors so that her hands will stop shaking. Patient is wondering if she can have a script for Xanax. Please call patient and advise.

## 2013-10-09 NOTE — Telephone Encounter (Signed)
I called the patient. The patient indicates that the tremors have gradually worsened over time since she was seen last. The patient wants medication that she can take every day for the tremor. I will start propranolol. The patient did take a half of a 0.5 mg Xanax tablet with some benefit.

## 2013-11-01 ENCOUNTER — Encounter (HOSPITAL_BASED_OUTPATIENT_CLINIC_OR_DEPARTMENT_OTHER): Payer: Self-pay | Admitting: Emergency Medicine

## 2013-11-01 ENCOUNTER — Ambulatory Visit: Payer: Medicare Other | Admitting: Nurse Practitioner

## 2013-11-01 ENCOUNTER — Emergency Department (HOSPITAL_BASED_OUTPATIENT_CLINIC_OR_DEPARTMENT_OTHER)
Admission: EM | Admit: 2013-11-01 | Discharge: 2013-11-01 | Disposition: A | Discharge status: Home / Self Care | Payer: Medicare Other | Attending: Emergency Medicine | Admitting: Emergency Medicine

## 2013-11-01 DIAGNOSIS — E1149 Type 2 diabetes mellitus with other diabetic neurological complication: Secondary | ICD-10-CM | POA: Insufficient documentation

## 2013-11-01 DIAGNOSIS — E119 Type 2 diabetes mellitus without complications: Secondary | ICD-10-CM | POA: Insufficient documentation

## 2013-11-01 DIAGNOSIS — E785 Hyperlipidemia, unspecified: Secondary | ICD-10-CM | POA: Insufficient documentation

## 2013-11-01 DIAGNOSIS — G4700 Insomnia, unspecified: Secondary | ICD-10-CM | POA: Insufficient documentation

## 2013-11-01 DIAGNOSIS — E1142 Type 2 diabetes mellitus with diabetic polyneuropathy: Secondary | ICD-10-CM | POA: Insufficient documentation

## 2013-11-01 DIAGNOSIS — Z8781 Personal history of (healed) traumatic fracture: Secondary | ICD-10-CM | POA: Insufficient documentation

## 2013-11-01 DIAGNOSIS — Z8719 Personal history of other diseases of the digestive system: Secondary | ICD-10-CM | POA: Insufficient documentation

## 2013-11-01 DIAGNOSIS — I10 Essential (primary) hypertension: Secondary | ICD-10-CM | POA: Insufficient documentation

## 2013-11-01 DIAGNOSIS — Z8619 Personal history of other infectious and parasitic diseases: Secondary | ICD-10-CM | POA: Insufficient documentation

## 2013-11-01 DIAGNOSIS — E669 Obesity, unspecified: Secondary | ICD-10-CM | POA: Insufficient documentation

## 2013-11-01 DIAGNOSIS — G8929 Other chronic pain: Secondary | ICD-10-CM | POA: Insufficient documentation

## 2013-11-01 DIAGNOSIS — B372 Candidiasis of skin and nail: Secondary | ICD-10-CM | POA: Insufficient documentation

## 2013-11-01 DIAGNOSIS — Z794 Long term (current) use of insulin: Secondary | ICD-10-CM | POA: Insufficient documentation

## 2013-11-01 DIAGNOSIS — Z79899 Other long term (current) drug therapy: Secondary | ICD-10-CM | POA: Insufficient documentation

## 2013-11-01 DIAGNOSIS — Z853 Personal history of malignant neoplasm of breast: Secondary | ICD-10-CM | POA: Insufficient documentation

## 2013-11-01 MED ORDER — NYSTATIN EX POWD: 1.0000 "application " | Freq: Two times a day (BID) | CUTANEOUS | Status: DC

## 2013-11-01 NOTE — Discharge Instructions (Signed)
Yeast Infection of the Skin Some yeast on the skin is normal, but sometimes it causes an infection. If you have a yeast infection, it shows up as white or light brown patches on brown skin. You can see it better in the summer on tan skin. It causes light-colored holes in your suntan. It can happen on any area of the body. This cannot be passed from person to person. HOME CARE  Scrub your skin daily with a dandruff shampoo. Your rash may take a couple weeks to get well.  Do not scratch or itch the rash. GET HELP RIGHT AWAY IF:   You get another infection from scratching. The skin may get warm, red, and may ooze fluid.  The infection does not seem to be getting better. MAKE SURE YOU:  Understand these instructions.  Will watch your condition.  Will get help right away if you are not doing well or get worse. Document Released: 08/06/2008 Document Revised: 11/16/2011 Document Reviewed: 08/06/2008 Exit[employer] Patient Information 2014 [employer].  

## 2013-11-01 NOTE — ED Provider Notes (Signed)
CSN: [id number]     Arrival date & time 11/01/13  1334 History   First MD Initiated Contact with Patient 11/01/13 1343     Chief Complaint  Patient presents with  . rash and skin seperation      (Consider location/radiation/quality/duration/timing/severity/associated sxs/prior Treatment) Patient is a 80 y.o. female presenting with rash. The history is provided by the patient.  Rash Location:  Torso Torso rash location: underneath pannus. Quality: peeling and redness   Quality: not bruising and not burning   Severity:  Mild Onset quality:  Gradual Timing:  Constant Progression:  Worsening Chronicity:  New Context: not animal contact, not diapers, not hot tub use and not pollen   Relieved by:  Nothing Worsened by:  Nothing tried Associated symptoms: no abdominal pain, no fever, no shortness of breath and not vomiting     Past Medical History  Diagnosis Date  . Diabetes mellitus   . Hypertension   . Hyperlipemia   . Arthritis   . Cancer     skin  . Staph infection   . Essential and other specified forms of tremor 05/03/2013  . Cholelithiasis   . Chronic insomnia   . Chronic low back pain   . Obesity   . Peripheral edema   . Diabetic peripheral neuropathy   . Wrist fracture     left October 2014   Past Surgical History  Procedure Laterality Date  . Abdominal hysterectomy    . Hernia repair    . Total knee arthroplasty    . Back surgery    . Cataract extraction    . Oophorectomy    . Neck surgery     Family History  Problem Relation Age of Onset  . Cancer Brother     prostate  . Heart disease Brother   . Alzheimer's disease Brother   . Stroke Mother   . Hypertension Mother   . COPD Brother   . Cancer Brother     Prostate cancer  . Hypertension Sister     Congestive heart failure   History  Substance Use Topics  . Smoking status: Never Smoker   . Smokeless tobacco: Never Used  . Alcohol Use: No   OB History   Grav Para Term Preterm Abortions TAB  SAB Ect Mult Living                 Review of Systems  Constitutional: Negative for fever.  Respiratory: Negative for cough and shortness of breath.   Gastrointestinal: Negative for vomiting and abdominal pain.  Skin: Positive for rash.  All other systems reviewed and are negative.      Allergies  Codeine; Dilaudid; and Mysoline  Home Medications   Current Outpatient Rx  Name  Route  Sig  Dispense  Refill  . BAYER CONTOUR NEXT TEST test strip   Other   1 each by Other route 3 (three) times daily.         . chlorthalidone (HYGROTON) 25 MG tablet   Oral   Take 25 mg by mouth daily.         . cloNIDine (CATAPRES) 0.1 MG tablet   Oral   Take 0.1 mg by mouth 2 (two) times daily.           [name] gabapentin (NEURONTIN) 300 MG capsule   Oral   Take 300 mg by mouth 3 (three) times daily.         . insulin glargine (LANTUS) 100 UNIT/ML injection  Subcutaneous   Inject 65 Units into the skin at bedtime.          [name] losartan (COZAAR) 100 MG tablet   Oral   Take 100 mg by mouth daily.           . metFORMIN (GLUCOPHAGE) 1000 MG tablet   Oral   Take 1,000 mg by mouth 2 (two) times daily with a meal.           . NYSTATIN, TOPICAL, POWD   Apply externally   Apply 1 application topically 2 (two) times daily.   60 g   0   . pravastatin (PRAVACHOL) 40 MG tablet   Oral   Take 40 mg by mouth at bedtime.          . propranolol (INDERAL) 10 MG tablet      1 tablet twice daily for 2 weeks, then take 2 tablets twice daily   120 tablet   3   . spironolactone (ALDACTONE) 50 MG tablet   Oral   Take 50 mg by mouth daily.         . TRADJENTA 5 MG TABS tablet   Oral   Take 5 mg by mouth daily.          BP 165/71  Pulse 91  Temp(Src) 97.1 F (36.2 C) (Oral)  Resp 18  SpO2 97% Physical Exam  Nursing note and vitals reviewed. Constitutional: She is oriented to person, place, and time. She appears well-developed and well-nourished. No distress.  HENT:   Head: Normocephalic and atraumatic.  Eyes: EOM are normal. Pupils are equal, round, and reactive to light.  Neck: Normal range of motion. Neck supple.  Cardiovascular: Normal rate and regular rhythm.  Exam reveals no friction rub.   No murmur heard. Pulmonary/Chest: Effort normal and breath sounds normal. No respiratory distress. She has no wheezes. She has no rales.  Abdominal: Soft. She exhibits no distension. There is no tenderness. There is no rebound.  Redness with mild flaking of skin underneath pannus, mostly concerntrated in central and R side of skin fold underneath pannus  Musculoskeletal: Normal range of motion. She exhibits no edema.  Neurological: She is alert and oriented to person, place, and time.  Skin: Rash (underneath pannus, c/w yeast infection. No cellulitis) noted. She is not diaphoretic.    ED Course  Procedures (including critical care time) Labs Review Labs Reviewed - No data to display Imaging Review No results found.  EKG Interpretation   None       MDM   Final diagnoses:  Yeast infection of the skin    53F with yeast infection underneath pannus. Mild flaking of skin. Redness in the skin fold, no signs of cellulitis. C/w yeast infection. No systemic symptoms. Will give nystatin powder, stable for discharge.     Osvaldo Shipper, MD 11/01/13 [phone]

## 2013-12-19 ENCOUNTER — Telehealth: Payer: Self-pay | Admitting: Neurology

## 2013-12-19 NOTE — Telephone Encounter (Signed)
Pt called feels like she is having an allergic reaction to her medication propranolol (INDERAL) 10 MG tablet she had rash at first now pt is having itchy red spotted on stomach and under breast. Pt would like for Dr. Jannifer Franklin or nurse to call her back concerning this matter and also wants to see if she can go off this medication and see if this is what is causing her problem. Thanks

## 2013-12-19 NOTE — Telephone Encounter (Signed)
Pt calling stating that she thinks she is having a allergic reaction to the medication propranolol 10 mg tablet, states that she had a rash at first and now pt states that she is having itchy red spots on stomach and under breast. Pt would like Dr. Jannifer Franklin to give her a call back. Please advise

## 2013-12-19 NOTE — Telephone Encounter (Signed)
I called the patient. The patient has had a rash since February, and various treatments have not been helpful. The patient will go off of the propranolol to see if the rash disappears. The rash began somewhere around the time she began the medication.

## 2014-01-16 ENCOUNTER — Ambulatory Visit (INDEPENDENT_AMBULATORY_CARE_PROVIDER_SITE_OTHER): Payer: Medicare Other | Admitting: Nurse Practitioner

## 2014-01-16 ENCOUNTER — Encounter: Payer: Self-pay | Admitting: Nurse Practitioner

## 2014-01-16 ENCOUNTER — Encounter (INDEPENDENT_AMBULATORY_CARE_PROVIDER_SITE_OTHER): Payer: Self-pay

## 2014-01-16 VITALS — BP 123/62 | HR 68 | Ht 69.0 in | Wt 232.0 lb

## 2014-01-16 DIAGNOSIS — G252 Other specified forms of tremor: Principal | ICD-10-CM

## 2014-01-16 DIAGNOSIS — G250 Essential tremor: Secondary | ICD-10-CM

## 2014-01-16 MED ORDER — PROPRANOLOL HCL 10 MG PO TABS: ORAL_TABLET | ORAL | Status: DC

## 2014-01-16 NOTE — Progress Notes (Signed)
I have read the note, and I agree with the clinical assessment and plan.  Cordarrel Stiefel K Cornella Emmer   

## 2014-01-16 NOTE — Patient Instructions (Signed)
Will restart Inderal, Rx given to patient, this is not the reason for her rash Followup in 6 months

## 2014-01-16 NOTE — Progress Notes (Signed)
[facility]  PATIENT: [name] DOB: [birth year]   REASON FOR VISIT: follow up for tremor   HISTORY OF PRESENT ILLNESS: [name], 80 year old female returns for followup. She was last seen by  Dr. Jannifer [name] 08/23/2013. She has a history of an essential tremor. She also has a vocal tremor. She was placed on low-dose Xanax at that time and had CT of the head which was normal. She called Dr. Jannifer [name]  in February due to the tremor worsening and she was placed on propanolol . She called back in April saying that she thought she had gotten a rash from the propanolol and so the medication was stopped however she still has the rash. She wants to be started back on the propanolol as it was very beneficial for her tremor. She has failed Mysoline in the past. She returns for reevaluation  HISTORY:of an essential tremor. The patient has a vocal tremor, and tremor in both upper extremities, possibly worse on the right side. The patient has noted onset of a jaw tremor that generally occurs at night, and for some reason tends to occur only when she is laying on her left side. The patient indicates that the tremor may wake her up at night. The tremor she claims it is violent, and it will go away when she sits up. The patient does not have a jaw tremor during the day. The patient does have some mild gait instability, and she fell in October 2014 with a left wrist fracture that did not require surgery. The patient has recovered from this. The patient was given a trial on Mysoline when she was seen last through this office, but she did not tolerate this medication. The patient is on no medications for her tremor at this time.   REVIEW OF SYSTEMS: Full 14 system review of systems performed and notable only for those listed, all others are neg:  Constitutional: N/A  Cardiovascular: N/A  Ear/Nose/Throat: N/A  Skin: N/A  Eyes: N/A  Respiratory: N/A  Gastroitestinal: Frequency of urination    Hematology/Lymphatic: N/A  Endocrine: Excessive thirst  Musculoskeletal: Hip pain Allergy/Immunology: N/A  Neurological: Tremor Psychiatric: N/A Sleep : NA   ALLERGIES: Allergies  Allergen Reactions  . Codeine Nausea And Vomiting  . Dilaudid [Hydromorphone Hcl] Nausea And Vomiting and Other (See Comments)    Severe sweating  . Mysoline [Primidone]     Nausea    HOME MEDICATIONS: Outpatient Prescriptions Prior to Visit  Medication Sig Dispense Refill  . BAYER CONTOUR NEXT TEST test strip 1 each by Other route 3 (three) times daily.      . chlorthalidone (HYGROTON) 25 MG tablet Take 25 mg by mouth daily.      . cloNIDine (CATAPRES) 0.1 MG tablet Take 0.1 mg by mouth 2 (two) times daily.        . insulin glargine (LANTUS) 100 UNIT/ML injection Inject 65 Units into the skin at bedtime.       [name] losartan (COZAAR) 100 MG tablet Take 100 mg by mouth daily.        . metFORMIN (GLUCOPHAGE) 1000 MG tablet Take 1,000 mg by mouth 2 (two) times daily with a meal.        . pravastatin (PRAVACHOL) 40 MG tablet Take 40 mg by mouth at bedtime.       . propranolol (INDERAL) 10 MG tablet 1 tablet twice daily for 2 weeks, then take 2 tablets twice daily  120 tablet  3  .  spironolactone (ALDACTONE) 50 MG tablet Take 50 mg by mouth daily.      . TRADJENTA 5 MG TABS tablet Take 5 mg by mouth daily.      [name] gabapentin (NEURONTIN) 300 MG capsule Take 300 mg by mouth 3 (three) times daily.      . NYSTATIN, TOPICAL, POWD Apply 1 application topically 2 (two) times daily.  60 g  0   No facility-administered medications prior to visit.    PAST MEDICAL HISTORY: Past Medical History  Diagnosis Date  . Diabetes mellitus   . Hypertension   . Hyperlipemia   . Arthritis   . Cancer     skin  . Staph infection   . Essential and other specified forms of tremor 05/03/2013  . Cholelithiasis   . Chronic insomnia   . Chronic low back pain   . Obesity   . Peripheral edema   . Diabetic peripheral neuropathy    . Wrist fracture     left October 2014    PAST SURGICAL HISTORY: Past Surgical History  Procedure Laterality Date  . Abdominal hysterectomy    . Hernia repair    . Total knee arthroplasty    . Back surgery    . Cataract extraction    . Oophorectomy    . Neck surgery      FAMILY HISTORY: Family History  Problem Relation Age of Onset  . Cancer Brother     prostate  . Heart disease Brother   . Alzheimer's disease Brother   . Stroke Mother   . Hypertension Mother   . COPD Brother   . Cancer Brother     Prostate cancer  . Hypertension Sister     Congestive heart failure    SOCIAL HISTORY: History   Social History  . Marital Status: Widowed    Spouse Name: N/A    Number of Children: 3  . Years of Education: 16   Occupational History  . Retired     Social History Main Topics  . Smoking status: Never Smoker   . Smokeless tobacco: Never Used  . Alcohol Use: No  . Drug Use: No  . Sexual Activity: No   Other Topics Concern  . Not on file   Social History Narrative   Patient lives at home alone.    Patient is widowed.    Patient has 3 children and 2 step children.    Patient is right handed.    Patient has a high school education and CNA certificate.     Patient is retired.      PHYSICAL EXAM  Filed Vitals:   01/16/14 1026  BP: 123/62  Pulse: 68  Height: 5\' 9"  (1.753 m)  Weight: 232 lb (105.235 kg)   Body mass index is 34.24 kg/(m^2).  General:  Patient is alert and cooperative at the time of the examination, she is moderately obese Skin: 2-3+ edema below the knees both  extremities  Neurological examination   Mentation: Alert oriented to time, place, history taking. Follows all commands speech and language fluent  Cranial nerve II-XII: Pupils were equal round reactive to light extraocular movements were full, visual field were full on confrontational test. Facial sensation and strength were normal. hearing was intact to finger rubbing  bilaterally. Uvula tongue midline. head turning and shoulder shrug were normal and symmetric.Tongue protrusion into cheek strength was normal. A vocal tremor is present, no jaw tremor is seen Motor: normal bulk and tone, full strength in the  BUE, BLE,  No focal weakness Sensory: normal and symmetric to light touch, pinprick, and  vibration  Coordination: finger-nose-finger, heel-to-shin bilaterally, no dysmetria, intention tremor with finger to nose finger bilaterally slightly worse on the right. Reflexes: Symmetric upper and lower but depressed, plantar responses were flexor bilaterally. Gait and Station: Rising up from seated position without assistance, wide based  stance,  moderate stride, good arm swing, smooth turning, able to perform tiptoe, and heel walking without difficulty. Tandem gait is unsteady, Romberg is negative  DIAGNOSTIC DATA (LABS, IMAGING, TESTING) - I reviewed patient records, labs, notes, testing and imaging myself where available.  Lab Results  Component Value Date   WBC 7.6 06/14/2013   HGB 11.0* 06/14/2013   HCT 32.7* 06/14/2013   MCV 83.2 06/14/2013   PLT 185 06/14/2013      Component Value Date/Time   NA 139 06/14/2013 1745   K 4.1 06/14/2013 1745   CL 105 06/14/2013 1745   CO2 21 06/14/2013 1745   GLUCOSE 188* 06/14/2013 1745   BUN 28* 06/14/2013 1745   CREATININE 1.06 06/14/2013 1745   CALCIUM 9.5 06/14/2013 1745   PROT 7.5 06/03/2011 1505   ALBUMIN 3.9 06/03/2011 1505   AST 34 06/03/2011 1505   ALT 20 06/03/2011 1505   ALKPHOS 53 06/03/2011 1505   BILITOT 0.5 06/03/2011 1505   GFRNONAA 49* 06/14/2013 1745   GFRAA 15* 06/14/2013 1745       ASSESSMENT AND PLAN  80 y.o. year old female  has a past medical history of benign essential tremor and gait disorder. She has been on propanolol in the past but reported a rash to Dr. Jannifer [name] and discontinued the medication. The rash did not go away and the patient wishes to go back on the medication as it was very beneficial  for her tremor  Will restart Inderal, Rx given to patient, this is not the reason for her rash Followup in 6 months Dennie Bible, [facility], [facility], [facility] [address], [place], [place] [address] [phone]

## 2014-02-05 DIAGNOSIS — Z87448 Personal history of other diseases of urinary system: Secondary | ICD-10-CM

## 2014-02-05 DIAGNOSIS — Z87440 Personal history of urinary (tract) infections: Secondary | ICD-10-CM

## 2014-02-05 HISTORY — DX: Personal history of urinary (tract) infections: Z87.440

## 2014-02-05 HISTORY — DX: Personal history of other diseases of urinary system: Z87.448

## 2014-02-07 ENCOUNTER — Emergency Department (HOSPITAL_COMMUNITY)
Admission: EM | Admit: 2014-02-07 | Discharge: 2014-02-08 | Disposition: A | Discharge status: Home / Self Care | Payer: Medicare Other | Source: Home / Self Care | Attending: Emergency Medicine | Admitting: Emergency Medicine

## 2014-02-07 ENCOUNTER — Emergency Department (HOSPITAL_COMMUNITY): Payer: Medicare Other

## 2014-02-07 ENCOUNTER — Emergency Department (HOSPITAL_BASED_OUTPATIENT_CLINIC_OR_DEPARTMENT_OTHER): Payer: Medicare Other

## 2014-02-07 ENCOUNTER — Encounter (HOSPITAL_BASED_OUTPATIENT_CLINIC_OR_DEPARTMENT_OTHER): Payer: Self-pay | Admitting: Emergency Medicine

## 2014-02-07 DIAGNOSIS — M549 Dorsalgia, unspecified: Secondary | ICD-10-CM | POA: Insufficient documentation

## 2014-02-07 DIAGNOSIS — Z85828 Personal history of other malignant neoplasm of skin: Secondary | ICD-10-CM | POA: Insufficient documentation

## 2014-02-07 DIAGNOSIS — Z79899 Other long term (current) drug therapy: Secondary | ICD-10-CM | POA: Insufficient documentation

## 2014-02-07 DIAGNOSIS — R110 Nausea: Secondary | ICD-10-CM | POA: Insufficient documentation

## 2014-02-07 DIAGNOSIS — M5432 Sciatica, left side: Secondary | ICD-10-CM

## 2014-02-07 DIAGNOSIS — Z8781 Personal history of (healed) traumatic fracture: Secondary | ICD-10-CM | POA: Insufficient documentation

## 2014-02-07 DIAGNOSIS — E1142 Type 2 diabetes mellitus with diabetic polyneuropathy: Secondary | ICD-10-CM | POA: Insufficient documentation

## 2014-02-07 DIAGNOSIS — E785 Hyperlipidemia, unspecified: Secondary | ICD-10-CM | POA: Insufficient documentation

## 2014-02-07 DIAGNOSIS — N390 Urinary tract infection, site not specified: Secondary | ICD-10-CM | POA: Insufficient documentation

## 2014-02-07 DIAGNOSIS — E1149 Type 2 diabetes mellitus with other diabetic neurological complication: Secondary | ICD-10-CM | POA: Insufficient documentation

## 2014-02-07 DIAGNOSIS — M542 Cervicalgia: Secondary | ICD-10-CM | POA: Insufficient documentation

## 2014-02-07 DIAGNOSIS — R32 Unspecified urinary incontinence: Secondary | ICD-10-CM | POA: Insufficient documentation

## 2014-02-07 DIAGNOSIS — G4700 Insomnia, unspecified: Secondary | ICD-10-CM | POA: Insufficient documentation

## 2014-02-07 DIAGNOSIS — E669 Obesity, unspecified: Secondary | ICD-10-CM | POA: Insufficient documentation

## 2014-02-07 DIAGNOSIS — I10 Essential (primary) hypertension: Secondary | ICD-10-CM | POA: Insufficient documentation

## 2014-02-07 DIAGNOSIS — Z8719 Personal history of other diseases of the digestive system: Secondary | ICD-10-CM | POA: Insufficient documentation

## 2014-02-07 DIAGNOSIS — R609 Edema, unspecified: Secondary | ICD-10-CM | POA: Insufficient documentation

## 2014-02-07 DIAGNOSIS — Z794 Long term (current) use of insulin: Secondary | ICD-10-CM | POA: Insufficient documentation

## 2014-02-07 DIAGNOSIS — Z8619 Personal history of other infectious and parasitic diseases: Secondary | ICD-10-CM | POA: Insufficient documentation

## 2014-02-07 LAB — CBC WITH DIFFERENTIAL/PLATELET
Band Neutrophils: 2 % (ref 0–10)
Basophils Absolute: 0 10*3/uL (ref 0.0–0.1)
Basophils Relative: 0 % (ref 0–1)
Eosinophils Absolute: 0.1 10*3/uL (ref 0.0–0.7)
Eosinophils Relative: 1 % (ref 0–5)
HCT: 37.7 % (ref 36.0–46.0)
Hemoglobin: 13 g/dL (ref 12.0–15.0)
Lymphocytes Relative: 26 % (ref 12–46)
Lymphs Abs: 3.4 10*3/uL (ref 0.7–4.0)
MCH: 29.9 pg (ref 26.0–34.0)
MCHC: 34.5 g/dL (ref 30.0–36.0)
MCV: 86.7 fL (ref 78.0–100.0)
Monocytes Absolute: 1.3 10*3/uL — ABNORMAL HIGH (ref 0.1–1.0)
Monocytes Relative: 10 % (ref 3–12)
Myelocytes: 2 %
Neutro Abs: 8.1 10*3/uL — ABNORMAL HIGH (ref 1.7–7.7)
Neutrophils Relative %: 59 % (ref 43–77)
Platelets: 254 10*3/uL (ref 150–400)
RBC: 4.35 MIL/uL (ref 3.87–5.11)
RDW: 13.6 % (ref 11.5–15.5)
WBC: 12.9 10*3/uL — ABNORMAL HIGH (ref 4.0–10.5)

## 2014-02-07 LAB — COMPREHENSIVE METABOLIC PANEL
ALT: 10 U/L (ref 0–35)
AST: 21 U/L (ref 0–37)
Albumin: 4.2 g/dL (ref 3.5–5.2)
Alkaline Phosphatase: 53 U/L (ref 39–117)
BUN: 42 mg/dL — ABNORMAL HIGH (ref 6–23)
CO2: 21 mEq/L (ref 19–32)
Calcium: 10.1 mg/dL (ref 8.4–10.5)
Chloride: 105 mEq/L (ref 96–112)
Creatinine, Ser: 1.4 mg/dL — ABNORMAL HIGH (ref 0.50–1.10)
GFR calc Af Amer: 40 mL/min — ABNORMAL LOW (ref >90)
GFR calc non Af Amer: 35 mL/min — ABNORMAL LOW (ref >90)
Glucose, Bld: 69 mg/dL — ABNORMAL LOW (ref 70–99)
Potassium: 4.6 mEq/L (ref 3.7–5.3)
Sodium: 140 mEq/L (ref 137–147)
Total Bilirubin: 0.5 mg/dL (ref 0.3–1.2)
Total Protein: 7.5 g/dL (ref 6.0–8.3)

## 2014-02-07 LAB — URINALYSIS, ROUTINE W REFLEX MICROSCOPIC
Bilirubin Urine: NEGATIVE
Glucose, UA: NEGATIVE mg/dL
Ketones, ur: NEGATIVE mg/dL
Nitrite: NEGATIVE
Protein, ur: NEGATIVE mg/dL
Specific Gravity, Urine: 1.01 (ref 1.005–1.030)
Urobilinogen, UA: 0.2 mg/dL (ref 0.0–1.0)
pH: 5.5 (ref 5.0–8.0)

## 2014-02-07 LAB — URINE MICROSCOPIC-ADD ON

## 2014-02-07 LAB — LIPASE, BLOOD: Lipase: 60 U/L — ABNORMAL HIGH (ref 11–59)

## 2014-02-07 LAB — CBG MONITORING, ED
Glucose-Capillary: 66 mg/dL — ABNORMAL LOW (ref 70–99)
Glucose-Capillary: 105 mg/dL — ABNORMAL HIGH (ref 70–99)

## 2014-02-07 MED ORDER — PROMETHAZINE HCL 25 MG/ML IJ SOLN
12.5000 mg | Freq: Once | INTRAMUSCULAR | Status: AC
  Administered 2014-02-07: 12.5 mg via INTRAVENOUS
  Filled 2014-02-07: qty 1

## 2014-02-07 MED ORDER — DEXTROSE 50 % IV SOLN
25.0000 mL | Freq: Once | INTRAVENOUS | Status: AC
  Administered 2014-02-07: 25 mL via INTRAVENOUS
  Filled 2014-02-07: qty 50

## 2014-02-07 MED ORDER — DEXTROSE-NACL 5-0.9 % IV SOLN
Freq: Once | INTRAVENOUS | Status: AC
  Administered 2014-02-07: 19:00:00 via INTRAVENOUS

## 2014-02-07 MED ORDER — ONDANSETRON HCL 4 MG/2ML IJ SOLN
4.0000 mg | Freq: Once | INTRAMUSCULAR | Status: AC
  Administered 2014-02-07: 4 mg via INTRAVENOUS
  Filled 2014-02-07: qty 2

## 2014-02-07 MED ORDER — LORAZEPAM 2 MG/ML IJ SOLN
1.0000 mg | Freq: Once | INTRAMUSCULAR | Status: AC
  Administered 2014-02-07: 1 mg via INTRAVENOUS
  Filled 2014-02-07: qty 1

## 2014-02-07 MED ORDER — DEXTROSE 50 % IV SOLN
1.0000 | Freq: Once | INTRAVENOUS | Status: AC
  Administered 2014-02-07: 50 mL via INTRAVENOUS
  Filled 2014-02-07: qty 50

## 2014-02-07 MED ORDER — CIPROFLOXACIN HCL 500 MG PO TABS
500.0000 mg | ORAL_TABLET | Freq: Once | ORAL | Status: AC
  Administered 2014-02-07: 500 mg via ORAL
  Filled 2014-02-07: qty 1

## 2014-02-07 NOTE — ED Provider Notes (Addendum)
CSN: [id number]     Arrival date & time 02/07/14  1539 History   First MD Initiated Contact with Patient 02/07/14 1623     Chief Complaint  Patient presents with  . Back Pain     (Consider location/radiation/quality/duration/timing/severity/associated sxs/prior Treatment) HPI Comments: Patient presents with nausea and back pain. She has a history of neck and back pain and is followed by Dr. Joya Salm. She's had surgery on her neck and her back by Dr. Joya Salm. She's been having increased pain to her lower back. She has some radiation down her left leg. She denies any numbness or weakness in the leg. She's been seen by Dr. Joya Salm who she saw last week. He started her on prednisone and oxycodone for pain. She states over last 2-3 days she's had some worsening nausea. She also has a history of gallbladder disease and has a two-day history of burning pain to her right upper quadrant. She denies any vomiting. She denies any fevers or chills. She denies any change in bowel habits. She does note however that the last 2 days she has completely lost control of her bladder. She's having to wear a depends and she states her urine just comes out without any indication. She denies any numbness around her perineal area. She has a history of an essential tremor which is unchanged from her baseline. She has some gait problems related to that but denies any change in her balance.  Patient is a 80 y.o. female presenting with back pain.  Back Pain Associated symptoms: abdominal pain   Associated symptoms: no chest pain, no fever, no headaches, no numbness and no weakness     Past Medical History  Diagnosis Date  . Diabetes mellitus   . Hypertension   . Hyperlipemia   . Arthritis   . Cancer     skin  . Staph infection   . Essential and other specified forms of tremor 05/03/2013  . Cholelithiasis   . Chronic insomnia   . Chronic low back pain   . Obesity   . Peripheral edema   . Diabetic peripheral neuropathy    . Wrist fracture     left October 2014   Past Surgical History  Procedure Laterality Date  . Abdominal hysterectomy    . Hernia repair    . Total knee arthroplasty    . Back surgery    . Cataract extraction    . Oophorectomy    . Neck surgery     Family History  Problem Relation Age of Onset  . Cancer Brother     prostate  . Heart disease Brother   . Alzheimer's disease Brother   . Stroke Mother   . Hypertension Mother   . COPD Brother   . Cancer Brother     Prostate cancer  . Hypertension Sister     Congestive heart failure   History  Substance Use Topics  . Smoking status: Never Smoker   . Smokeless tobacco: Never Used  . Alcohol Use: No   OB History   Grav Para Term Preterm Abortions TAB SAB Ect Mult Living                 Review of Systems  Constitutional: Negative for fever, chills, diaphoresis and fatigue.  HENT: Negative for congestion, rhinorrhea and sneezing.   Eyes: Negative.   Respiratory: Negative for cough, chest tightness and shortness of breath.   Cardiovascular: Negative for chest pain and leg swelling.  Gastrointestinal: Positive for  nausea and abdominal pain. Negative for vomiting, diarrhea and blood in stool.  Genitourinary: Negative for frequency, hematuria, flank pain and difficulty urinating.       Urinary incontinence  Musculoskeletal: Positive for back pain. Negative for arthralgias.  Skin: Negative for rash.  Neurological: Negative for dizziness, speech difficulty, weakness, numbness and headaches.      Allergies  Codeine; Dilaudid; and Mysoline  Home Medications   Prior to Admission medications   Medication Sig Start Date End Date Taking? Authorizing Provider  BAYER CONTOUR NEXT TEST test strip 1 each by Other route 3 (three) times daily. 07/24/13   Historical Provider, MD  chlorthalidone (HYGROTON) 25 MG tablet Take 25 mg by mouth daily. 04/17/13   Historical Provider, MD  cloNIDine (CATAPRES) 0.1 MG tablet Take 0.1 mg by  mouth 2 (two) times daily.      Historical Provider, MD  clotrimazole-betamethasone (LOTRISONE) cream  01/02/14   Historical Provider, MD  insulin glargine (LANTUS) 100 UNIT/ML injection Inject 65 Units into the skin at bedtime.     Historical Provider, MD  losartan (COZAAR) 100 MG tablet Take 100 mg by mouth daily.      Historical Provider, MD  metFORMIN (GLUCOPHAGE) 1000 MG tablet Take 1,000 mg by mouth 2 (two) times daily with a meal.      Historical Provider, MD  pravastatin (PRAVACHOL) 40 MG tablet Take 40 mg by mouth at bedtime.  10/01/11   Historical Provider, MD  propranolol (INDERAL) 10 MG tablet 1 tablet twice daily for 2 weeks, then take 2 tablets twice daily 01/16/14   Dennie Bible, NP  spironolactone (ALDACTONE) 50 MG tablet Take 50 mg by mouth daily. 04/17/13   Historical Provider, MD  TRADJENTA 5 MG TABS tablet Take 5 mg by mouth daily. 07/24/13   Historical Provider, MD  traMADol (ULTRAM) 50 MG tablet  01/02/14   Historical Provider, MD   BP 130/63  Pulse 59  Temp(Src) 98.4 F (36.9 C) (Oral)  Resp 16  Ht 5\' 9"  (1.753 m)  Wt 230 lb (104.327 kg)  BMI 33.95 kg/m2  SpO2 97% Physical Exam  Constitutional: She is oriented to person, place, and time. She appears well-developed and well-nourished.  HENT:  Head: Normocephalic and atraumatic.  Eyes: Pupils are equal, round, and reactive to light.  Neck: Normal range of motion. Neck supple.  Cardiovascular: Normal rate, regular rhythm and normal heart sounds.   Pulmonary/Chest: Effort normal and breath sounds normal. No respiratory distress. She has no wheezes. She has no rales. She exhibits no tenderness.  Abdominal: Soft. Bowel sounds are normal. There is tenderness (Tenderness to the right upper quadrant). There is no rebound and no guarding.  Musculoskeletal: Normal range of motion. She exhibits no edema.  Lymphadenopathy:    She has no cervical adenopathy.  Neurological: She is alert and oriented to person, place, and  time.   Motor 4/5 all show knees. Sensation grossly intact to light touch all extremities. She has normal rectal tone. She has normal sensation to her perineal area.  Skin: Skin is warm and dry. No rash noted.  Psychiatric: She has a normal mood and affect.    ED Course  Procedures (including critical care time) Labs Review Results for orders placed during the hospital encounter of 02/07/14  URINALYSIS, ROUTINE W REFLEX MICROSCOPIC      Result Value Ref Range   Color, Urine YELLOW  YELLOW   APPearance CLOUDY (*) CLEAR   Specific Gravity, Urine 1.010  1.005 -  1.030   pH 5.5  5.0 - 8.0   Glucose, UA NEGATIVE  NEGATIVE mg/dL   Hgb urine dipstick TRACE (*) NEGATIVE   Bilirubin Urine NEGATIVE  NEGATIVE   Ketones, ur NEGATIVE  NEGATIVE mg/dL   Protein, ur NEGATIVE  NEGATIVE mg/dL   Urobilinogen, UA 0.2  0.0 - 1.0 mg/dL   Nitrite NEGATIVE  NEGATIVE   Leukocytes, UA MODERATE (*) NEGATIVE  URINE MICROSCOPIC-ADD ON      Result Value Ref Range   Squamous Epithelial / LPF RARE  RARE   WBC, UA 11-20  <3 WBC/hpf   RBC / HPF 0-2  <3 RBC/hpf   Bacteria, UA FEW (*) RARE  CBC WITH DIFFERENTIAL      Result Value Ref Range   WBC 12.9 (*) 4.0 - 10.5 K/uL   RBC 4.35  3.87 - 5.11 MIL/uL   Hemoglobin 13.0  12.0 - 15.0 g/dL   HCT 37.7  36.0 - 46.0 %   MCV 86.7  78.0 - 100.0 fL   MCH 29.9  26.0 - 34.0 pg   MCHC 34.5  30.0 - 36.0 g/dL   RDW 13.6  11.5 - 15.5 %   Platelets 254  150 - 400 K/uL   Neutrophils Relative % 59  43 - 77 %   Lymphocytes Relative 26  12 - 46 %   Monocytes Relative 10  3 - 12 %   Eosinophils Relative 1  0 - 5 %   Basophils Relative 0  0 - 1 %   Band Neutrophils 2  0 - 10 %   Myelocytes 2     Neutro Abs 8.1 (*) 1.7 - 7.7 K/uL   Lymphs Abs 3.4  0.7 - 4.0 K/uL   Monocytes Absolute 1.3 (*) 0.1 - 1.0 K/uL   Eosinophils Absolute 0.1  0.0 - 0.7 K/uL   Basophils Absolute 0.0  0.0 - 0.1 K/uL   RBC Morphology ELLIPTOCYTES     Smear Review LARGE PLATELETS PRESENT     COMPREHENSIVE METABOLIC PANEL      Result Value Ref Range   Sodium 140  137 - 147 mEq/L   Potassium 4.6  3.7 - 5.3 mEq/L   Chloride 105  96 - 112 mEq/L   CO2 21  19 - 32 mEq/L   Glucose, Bld 69 (*) 70 - 99 mg/dL   BUN 42 (*) 6 - 23 mg/dL   Creatinine, Ser 1.40 (*) 0.50 - 1.10 mg/dL   Calcium 10.1  8.4 - 10.5 mg/dL   Total Protein 7.5  6.0 - 8.3 g/dL   Albumin 4.2  3.5 - 5.2 g/dL   AST 21  0 - 37 U/L   ALT 10  0 - 35 U/L   Alkaline Phosphatase 53  39 - 117 U/L   Total Bilirubin 0.5  0.3 - 1.2 mg/dL   GFR calc non Af Amer 35 (*) >90 mL/min   GFR calc Af Amer 40 (*) >90 mL/min  LIPASE, BLOOD      Result Value Ref Range   Lipase 60 (*) 11 - 59 U/L   US Abdomen Complete  02/07/2014   CLINICAL DATA:  Nausea and right upper quadrant abdominal pain. Known cholelithiasis.  EXAM: ULTRASOUND ABDOMEN COMPLETE  COMPARISON:  06/03/2011 and CT on 05/01/2011.  FINDINGS: Gallbladder:  Shadowing gallstone in the gallbladder measures approximately 2 cm. There is no associated gallbladder wall thickening, sonographic Murphy sign or pericholecystic fluid.  Common bile duct:  Diameter: Normal caliber of  2 mm  Liver:  The liver demonstrates coarse echotexture and increased echogenicity, likely reflecting diffuse steatosis. No overt cirrhotic contour abnormalities or focal lesions are identified. There is no evidence of intrahepatic biliary ductal dilatation. The portal vein is open. Stable 1 cm cyst of the subcapsular lateral left lobe appears benign.  IVC:  No abnormality visualized.  Pancreas:  Visualized portion unremarkable.  Spleen:  Size and appearance within normal limits.  Right Kidney:  Length: 11.5 cm. Echogenicity within normal limits. No mass or hydronephrosis visualized.  Left Kidney:  Length: 11.8 cm. Echogenicity within normal limits. No mass or hydronephrosis visualized.  Abdominal aorta:  No aneurysm visualized.  Other findings:  None.  IMPRESSION: 1. Stable cholelithiasis. No evidence of  cholecystitis or biliary obstruction by ultrasound. 2. Hepatic steatosis. 3. Stable small hepatic cyst.   Electronically Signed   By: Aletta Edouard M.D.   On: 02/07/2014 18:25      Imaging Review US Abdomen Complete  02/07/2014   CLINICAL DATA:  Nausea and right upper quadrant abdominal pain. Known cholelithiasis.  EXAM: ULTRASOUND ABDOMEN COMPLETE  COMPARISON:  06/03/2011 and CT on 05/01/2011.  FINDINGS: Gallbladder:  Shadowing gallstone in the gallbladder measures approximately 2 cm. There is no associated gallbladder wall thickening, sonographic Murphy sign or pericholecystic fluid.  Common bile duct:  Diameter: Normal caliber of 2 mm  Liver:  The liver demonstrates coarse echotexture and increased echogenicity, likely reflecting diffuse steatosis. No overt cirrhotic contour abnormalities or focal lesions are identified. There is no evidence of intrahepatic biliary ductal dilatation. The portal vein is open. Stable 1 cm cyst of the subcapsular lateral left lobe appears benign.  IVC:  No abnormality visualized.  Pancreas:  Visualized portion unremarkable.  Spleen:  Size and appearance within normal limits.  Right Kidney:  Length: 11.5 cm. Echogenicity within normal limits. No mass or hydronephrosis visualized.  Left Kidney:  Length: 11.8 cm. Echogenicity within normal limits. No mass or hydronephrosis visualized.  Abdominal aorta:  No aneurysm visualized.  Other findings:  None.  IMPRESSION: 1. Stable cholelithiasis. No evidence of cholecystitis or biliary obstruction by ultrasound. 2. Hepatic steatosis. 3. Stable small hepatic cyst.   Electronically Signed   By: Aletta Edouard M.D.   On: 02/07/2014 18:25     EKG Interpretation None      MDM   Final diagnoses:  Back pain with left-sided sciatica  UTI (lower urinary tract infection)  Incontinence    Patient presents with nausea, associated with radicular low back pain and urinary incontinence. Denies a could be medication related as she  just started oxycodone and prednisone. She does have a history of gallbladder disease and did have some right upper quadrant pain. Her ultrasound is unremarkable for evidence of cholecystitis. Her lipase is just above the normal limits. I don't feel this represents gallstone pancreatitis. She has worsening low back pain with radiculopathy. This is associated with a two-day history of urinary incontinence. She does not have any sensation that she needs to urinate. She does have evidence of UTI however given her radicular back pain, he didn't feel that she needs to have MRI to rule out cord compression. I spoke with Dr. Reather Converse at [facility] ED who has accepted patient for transfer. If the MRI is negative, and she's feeling better as far as her nausea, she can likely be discharged on antibiotics for her UTI. She's had Zofran and Phenergan for nausea. She also had some mild hypoglycemia. She was given half an amp  of D50 and was started on D5 normal saline drip.  She also has a mild elevation in creatinine which will need f/u by her PMD.    Malvin Johns, MD 02/07/14 [place], MD 02/07/14 1902

## 2014-02-07 NOTE — ED Notes (Signed)
Zavitz, MD at bedside 

## 2014-02-07 NOTE — ED Notes (Addendum)
Pt amb to triage with out difficulty Pt c/o lower back pain x 2 months which radiates down left leg, being treated by PMD with percocet, loss  Of bladder control x 3 days and nausea. DX tremors  by neuro

## 2014-02-07 NOTE — ED Provider Notes (Signed)
CSN: [id number]     Arrival date & time 02/07/14  1539 History   First MD Initiated Contact with Patient 02/07/14 1623     Chief Complaint  Patient presents with  . Back Pain     (Consider location/radiation/quality/duration/timing/severity/associated sxs/prior Treatment) HPI Comments: 80 year old female with history of gallstones, tremor, lumbar surgery sent over from [facility] for further evaluation and MRI of lumbar spine. Patient has had intermittent sciatica-like pain with lower back pain and radiation down the left posterior thigh and follows Dr.Botero.  The past 3-4 days patient has had urinary incontinence which is new for her. Patient did have a mild UTI at the outside facility this does not explain her significant incontinence. Patient has mild lower back pain but denies weakness or numbness in lower extremities. No stool changes and patient had normal rectal tone per prior physician. No fevers or chills. No new injuries  Patient is a 80 y.o. female presenting with back pain. The history is provided by the patient and medical records.  Back Pain Associated symptoms: no chest pain, no fever and no headaches     Past Medical History  Diagnosis Date  . Diabetes mellitus   . Hypertension   . Hyperlipemia   . Arthritis   . Cancer     skin  . Staph infection   . Essential and other specified forms of tremor 05/03/2013  . Cholelithiasis   . Chronic insomnia   . Chronic low back pain   . Obesity   . Peripheral edema   . Diabetic peripheral neuropathy   . Wrist fracture     left October 2014   Past Surgical History  Procedure Laterality Date  . Abdominal hysterectomy    . Hernia repair    . Total knee arthroplasty    . Back surgery    . Cataract extraction    . Oophorectomy    . Neck surgery     Family History  Problem Relation Age of Onset  . Cancer Brother     prostate  . Heart disease Brother   . Alzheimer's disease Brother   . Stroke Mother   .  Hypertension Mother   . COPD Brother   . Cancer Brother     Prostate cancer  . Hypertension Sister     Congestive heart failure   History  Substance Use Topics  . Smoking status: Never Smoker   . Smokeless tobacco: Never Used  . Alcohol Use: No   OB History   Grav Para Term Preterm Abortions TAB SAB Ect Mult Living                 Review of Systems  Constitutional: Positive for appetite change. Negative for fever and chills.  HENT: Negative for congestion.   Eyes: Negative for visual disturbance.  Respiratory: Negative for shortness of breath.   Cardiovascular: Negative for chest pain.  Gastrointestinal: Positive for nausea.  Genitourinary: Negative for flank pain.  Musculoskeletal: Positive for back pain. Negative for neck pain and neck stiffness.  Skin: Negative for rash.  Neurological: Negative for light-headedness and headaches.      Allergies  Codeine; Dilaudid; and Mysoline  Home Medications   Prior to Admission medications   Medication Sig Start Date End Date Taking? Authorizing Provider  BAYER CONTOUR NEXT TEST test strip 1 each by Other route 3 (three) times daily. 07/24/13   Historical Provider, MD  chlorthalidone (HYGROTON) 25 MG tablet Take 25 mg by mouth daily.  04/17/13   Historical Provider, MD  cloNIDine (CATAPRES) 0.1 MG tablet Take 0.1 mg by mouth 2 (two) times daily.      Historical Provider, MD  clotrimazole-betamethasone (LOTRISONE) cream  01/02/14   Historical Provider, MD  insulin glargine (LANTUS) 100 UNIT/ML injection Inject 65 Units into the skin at bedtime.     Historical Provider, MD  losartan (COZAAR) 100 MG tablet Take 100 mg by mouth daily.      Historical Provider, MD  metFORMIN (GLUCOPHAGE) 1000 MG tablet Take 1,000 mg by mouth 2 (two) times daily with a meal.      Historical Provider, MD  pravastatin (PRAVACHOL) 40 MG tablet Take 40 mg by mouth at bedtime.  10/01/11   Historical Provider, MD  propranolol (INDERAL) 10 MG tablet 1 tablet  twice daily for 2 weeks, then take 2 tablets twice daily 01/16/14   Dennie Bible, NP  spironolactone (ALDACTONE) 50 MG tablet Take 50 mg by mouth daily. 04/17/13   Historical Provider, MD  TRADJENTA 5 MG TABS tablet Take 5 mg by mouth daily. 07/24/13   Historical Provider, MD  traMADol (ULTRAM) 50 MG tablet  01/02/14   Historical Provider, MD   BP 124/64  Pulse 62  Temp(Src) 98.4 F (36.9 C) (Oral)  Resp 18  Ht 5\' 9"  (1.753 m)  Wt 230 lb (104.327 kg)  BMI 33.95 kg/m2  SpO2 95% Physical Exam  Nursing note and vitals reviewed. Constitutional: She is oriented to person, place, and time. She appears well-developed and well-nourished.  HENT:  Head: Normocephalic and atraumatic.  Eyes: Conjunctivae are normal. Right eye exhibits no discharge. Left eye exhibits no discharge.  Neck: Normal range of motion. Neck supple. No tracheal deviation present.  Cardiovascular: Normal rate and regular rhythm.   Pulmonary/Chest: Effort normal and breath sounds normal.  Musculoskeletal: She exhibits tenderness. She exhibits no edema.  Mild paraspinal lumbar tenderness no midline tenderness.  Neurological: She is alert and oriented to person, place, and time.  Patient has 5+ flexion extension of hips knees and great toes bilateral lower extremities. Sensation to palpation intact in major nerves in lower extremities.  Skin: Skin is warm. No rash noted.  Psychiatric: She has a normal mood and affect.    ED Course  Procedures (including critical care time) Labs Review Labs Reviewed  URINALYSIS, ROUTINE W REFLEX MICROSCOPIC - Abnormal; Notable for the following:    APPearance CLOUDY (*)    Hgb urine dipstick TRACE (*)    Leukocytes, UA MODERATE (*)    All other components within normal limits  URINE MICROSCOPIC-ADD ON - Abnormal; Notable for the following:    Bacteria, UA FEW (*)    All other components within normal limits  CBC WITH DIFFERENTIAL - Abnormal; Notable for the following:    WBC  12.9 (*)    Neutro Abs 8.1 (*)    Monocytes Absolute 1.3 (*)    All other components within normal limits  COMPREHENSIVE METABOLIC PANEL - Abnormal; Notable for the following:    Glucose, Bld 69 (*)    BUN 42 (*)    Creatinine, Ser 1.40 (*)    GFR calc non Af Amer 35 (*)    GFR calc Af Amer 40 (*)    All other components within normal limits  LIPASE, BLOOD - Abnormal; Notable for the following:    Lipase 60 (*)    All other components within normal limits  CBG MONITORING, ED - Abnormal; Notable for the following:  Glucose-Capillary 66 (*)    All other components within normal limits  CBG MONITORING, ED - Abnormal; Notable for the following:    Glucose-Capillary 105 (*)    All other components within normal limits  URINE CULTURE    Imaging Review Mr Lumbar Spine Wo Contrast  02/07/2014   CLINICAL DATA:  LBP, urinary incontinence  EXAM: MRI LUMBAR SPINE WITHOUT CONTRAST  TECHNIQUE: Multiplanar, multisequence MR imaging of the lumbar spine was performed. No intravenous contrast was administered.  COMPARISON:  Prior radiograph from 02/02/2014  FINDINGS: For the purposes of this dictation, the lowest well-formed intervertebral disc spaces presumed to be the L5-S1 level, and there presumed to be 5 lumbar type vertebral bodies.  The vertebral bodies are normally aligned with preservation of the normal lumbar lordosis. Vertebral body heights are well maintained.  Signal intensity within the vertebral body bone marrow and visualized spinal cord is normal. The conus medullaris terminates normally at the L1 level.  T11-12: Seen only on sagittal projection. No significant disc bulge or disc protrusion. No canal or neural foraminal stenosis.  T12-L1: Seen only on sagittal projection. No significant disc bulge or disc protrusion. No canal or neural foraminal stenosis.  L1-2: No disc bulge or disc protrusion. Mild bilateral facet hypertrophy. No canal or neural foraminal stenosis.  L2-3: Mild diffuse  degenerative disc desiccation without significant disc bulge. No focal disc protrusion. Prominent anterior bridging osteophytes noted at this level. There is moderate bilateral facet hypertrophy resulting in mild stenosis of the lateral recesses bilaterally. No significant canal or foraminal stenosis.  L3-4: There is diffuse degenerative disc desiccation with minimal disc bulge. Moderate bilateral facet hypertrophy present. There is shortening of the pedicles at this level. There is resultant moderate canal with mild bilateral foraminal stenosis.  L4-5: The patient is status post right hemi laminectomy at this level. Degenerative disc bulge with intervertebral disk space narrowing and disk desiccation. There is mild bilateral facet hypertrophy minimal canal narrowing present at this level. There is mild left foraminal stenosis.  L5-S1: There is mild diffuse degenerative disc bulge with tiny superimposed central disc protrusion. No significant canal stenosis. There is mild right foraminal narrowing.  Asymmetric fatty atrophy noted within the right paraspinous musculature within the lower lumbosacral spine. No paraspinous soft tissue mass or other acute abnormality. Visualized visceral structures are unremarkable. Visualized intra-abdominal aorta is of normal caliber without evidence of aneurysm. No retroperitoneal adenopathy.  IMPRESSION: 1. Postoperative changes from prior decompressive right hemi laminectomy at L4-5 2. Right foraminal disc protrusion at L4-5 with resultant moderate right foraminal stenosis without significant canal narrowing. 3. Mild degenerative disc bulge with prominent facet hypertrophy at L3-4, which superimposed on short pedicles at this level results in moderate canal and mild bilateral foraminal stenosis. 4. Additional mild degenerative changes as above.   Electronically Signed   By: Jeannine Boga M.D.   On: 02/07/2014 22:48   US Abdomen Complete  02/07/2014   CLINICAL DATA:  Nausea  and right upper quadrant abdominal pain. Known cholelithiasis.  EXAM: ULTRASOUND ABDOMEN COMPLETE  COMPARISON:  06/03/2011 and CT on 05/01/2011.  FINDINGS: Gallbladder:  Shadowing gallstone in the gallbladder measures approximately 2 cm. There is no associated gallbladder wall thickening, sonographic Murphy sign or pericholecystic fluid.  Common bile duct:  Diameter: Normal caliber of 2 mm  Liver:  The liver demonstrates coarse echotexture and increased echogenicity, likely reflecting diffuse steatosis. No overt cirrhotic contour abnormalities or focal lesions are identified. There is no evidence of intrahepatic biliary  ductal dilatation. The portal vein is open. Stable 1 cm cyst of the subcapsular lateral left lobe appears benign.  IVC:  No abnormality visualized.  Pancreas:  Visualized portion unremarkable.  Spleen:  Size and appearance within normal limits.  Right Kidney:  Length: 11.5 cm. Echogenicity within normal limits. No mass or hydronephrosis visualized.  Left Kidney:  Length: 11.8 cm. Echogenicity within normal limits. No mass or hydronephrosis visualized.  Abdominal aorta:  No aneurysm visualized.  Other findings:  None.  IMPRESSION: 1. Stable cholelithiasis. No evidence of cholecystitis or biliary obstruction by ultrasound. 2. Hepatic steatosis. 3. Stable small hepatic cyst.   Electronically Signed   By: Aletta Edouard M.D.   On: 02/07/2014 18:25     EKG Interpretation None      MDM   Final diagnoses:  Back pain with left-sided sciatica  UTI (lower urinary tract infection)  Incontinence   Patient sent over MRI lumbar spine. Patient having low glucose however she is a diabetic and holding on by mouth intake until MRI results.  dextrose IV push improved glucose. patient received antibiotics prior ER.  Nl strength and sensation in ED. Medications  ondansetron (ZOFRAN) injection 4 mg (4 mg Intravenous Given 02/07/14 1715)  ciprofloxacin (CIPRO) tablet 500 mg (500 mg Oral Given 02/07/14 1809)   dextrose 50 % solution 25 mL (25 mLs Intravenous Given 02/07/14 1900)  promethazine (PHENERGAN) injection 12.5 mg (12.5 mg Intravenous Given 02/07/14 1904)  dextrose 5 %-0.9 % sodium chloride infusion ( Intravenous Transfusing/Transfer 02/07/14 2003)  dextrose 50 % solution 50 mL (50 mLs Intravenous Given 02/07/14 2122)  LORazepam (ATIVAN) injection 1 mg (1 mg Intravenous Given 02/07/14 2129)    MRI results reviewed with moderate canal stenosis. Patient neurosurgery for further recommendations.  Discussed MRI results with neurosurgery on call who recommended close followup with Dr. Joya Salm no indication for emergent admission or surgery at this time. Plan for oral antibiotics and close followup with reasons to return discussed.  Food given for low glu. Results and differential diagnosis were discussed with the patient/parent/guardian. Close follow up outpatient was discussed, comfortable with the plan.   Filed Vitals:   02/07/14 1811 02/07/14 2103 02/07/14 2238 02/07/14 2352  BP: 130/63 155/99 124/64 143/74  Pulse: 59 64 62 66  Temp:      TempSrc:      Resp: 16  18   Height:      Weight:      SpO2:  100% 95% 96%      Mariea Clonts, MD 02/08/14 0006

## 2014-02-07 NOTE — ED Notes (Signed)
Patient given decaf coffee per MD Reather Converse approval.

## 2014-02-08 LAB — URINE CULTURE
Colony Count: >=100000
Special Requests: NORMAL

## 2014-02-08 LAB — CBG MONITORING, ED
Glucose-Capillary: 145 mg/dL — ABNORMAL HIGH (ref 70–99)
Glucose-Capillary: 69 mg/dL — ABNORMAL LOW (ref 70–99)

## 2014-02-08 MED ORDER — CIPROFLOXACIN HCL 500 MG PO TABS: 500.0000 mg | ORAL_TABLET | Freq: Two times a day (BID) | ORAL | Status: DC

## 2014-02-08 NOTE — ED Notes (Addendum)
Zavitz, MD is aware of the pt's blood sugar. He instructed this RN to give the pt a meal. This Rn gave the pt 2 orange juice containers and a [place] sandwich.

## 2014-02-08 NOTE — ED Notes (Signed)
Pt's blood sugar is 69.

## 2014-02-08 NOTE — ED Notes (Signed)
Zavitz, MD at bedside 

## 2014-02-08 NOTE — Discharge Instructions (Signed)
If you were given medicines take as directed.  If you are on coumadin or contraceptives realize their levels and effectiveness is altered by many different medicines.  If you have any reaction (rash, tongues swelling, other) to the medicines stop taking and see a physician.   Please follow up as directed and return to the ER or see a physician for new or worsening symptoms (loss of sensation perianal, leg weakness, fevers, other).  Thank you. Filed Vitals:   02/07/14 1811 02/07/14 2103 02/07/14 2238 02/07/14 2352  BP: 130/63 155/99 124/64 143/74  Pulse: 59 64 62 66  Temp:      TempSrc:      Resp: 16  18   Height:      Weight:      SpO2:  100% 95% 96%

## 2014-02-11 ENCOUNTER — Inpatient Hospital Stay (HOSPITAL_COMMUNITY)
Admission: EM | Admit: 2014-02-11 | Discharge: 2014-02-13 | DRG: 683 | Disposition: A | Discharge status: Home / Self Care | Payer: Medicare Other | Attending: Internal Medicine | Admitting: Internal Medicine

## 2014-02-11 ENCOUNTER — Encounter (HOSPITAL_COMMUNITY): Payer: Self-pay | Admitting: Emergency Medicine

## 2014-02-11 ENCOUNTER — Emergency Department (HOSPITAL_COMMUNITY): Payer: Medicare Other

## 2014-02-11 ENCOUNTER — Inpatient Hospital Stay (HOSPITAL_COMMUNITY): Payer: Medicare Other

## 2014-02-11 DIAGNOSIS — N189 Chronic kidney disease, unspecified: Secondary | ICD-10-CM | POA: Diagnosis present

## 2014-02-11 DIAGNOSIS — E872 Acidosis, unspecified: Secondary | ICD-10-CM | POA: Diagnosis present

## 2014-02-11 DIAGNOSIS — K8020 Calculus of gallbladder without cholecystitis without obstruction: Secondary | ICD-10-CM

## 2014-02-11 DIAGNOSIS — Z820 Family history of epilepsy and other diseases of the nervous system: Secondary | ICD-10-CM

## 2014-02-11 DIAGNOSIS — W19XXXA Unspecified fall, initial encounter: Secondary | ICD-10-CM | POA: Diagnosis present

## 2014-02-11 DIAGNOSIS — E1142 Type 2 diabetes mellitus with diabetic polyneuropathy: Secondary | ICD-10-CM | POA: Diagnosis present

## 2014-02-11 DIAGNOSIS — Z9849 Cataract extraction status, unspecified eye: Secondary | ICD-10-CM

## 2014-02-11 DIAGNOSIS — N390 Urinary tract infection, site not specified: Secondary | ICD-10-CM

## 2014-02-11 DIAGNOSIS — E785 Hyperlipidemia, unspecified: Secondary | ICD-10-CM | POA: Diagnosis present

## 2014-02-11 DIAGNOSIS — Z96659 Presence of unspecified artificial knee joint: Secondary | ICD-10-CM

## 2014-02-11 DIAGNOSIS — Z823 Family history of stroke: Secondary | ICD-10-CM

## 2014-02-11 DIAGNOSIS — E119 Type 2 diabetes mellitus without complications: Secondary | ICD-10-CM

## 2014-02-11 DIAGNOSIS — Z8249 Family history of ischemic heart disease and other diseases of the circulatory system: Secondary | ICD-10-CM

## 2014-02-11 DIAGNOSIS — I10 Essential (primary) hypertension: Secondary | ICD-10-CM

## 2014-02-11 DIAGNOSIS — N179 Acute kidney failure, unspecified: Secondary | ICD-10-CM | POA: Diagnosis present

## 2014-02-11 DIAGNOSIS — G252 Other specified forms of tremor: Secondary | ICD-10-CM

## 2014-02-11 DIAGNOSIS — G250 Essential tremor: Secondary | ICD-10-CM

## 2014-02-11 DIAGNOSIS — E1149 Type 2 diabetes mellitus with other diabetic neurological complication: Secondary | ICD-10-CM | POA: Diagnosis present

## 2014-02-11 DIAGNOSIS — E669 Obesity, unspecified: Secondary | ICD-10-CM | POA: Diagnosis present

## 2014-02-11 DIAGNOSIS — Z6834 Body mass index (BMI) 34.0-34.9, adult: Secondary | ICD-10-CM

## 2014-02-11 DIAGNOSIS — R259 Unspecified abnormal involuntary movements: Secondary | ICD-10-CM | POA: Diagnosis present

## 2014-02-11 DIAGNOSIS — I129 Hypertensive chronic kidney disease with stage 1 through stage 4 chronic kidney disease, or unspecified chronic kidney disease: Secondary | ICD-10-CM | POA: Diagnosis present

## 2014-02-11 DIAGNOSIS — Z79899 Other long term (current) drug therapy: Secondary | ICD-10-CM

## 2014-02-11 LAB — URINE MICROSCOPIC-ADD ON

## 2014-02-11 LAB — PROTIME-INR
INR: 1.14 (ref 0.00–1.49)
Prothrombin Time: 14.4 seconds (ref 11.6–15.2)

## 2014-02-11 LAB — URINALYSIS, ROUTINE W REFLEX MICROSCOPIC
Bilirubin Urine: NEGATIVE
Glucose, UA: NEGATIVE mg/dL
Ketones, ur: NEGATIVE mg/dL
Nitrite: NEGATIVE
Protein, ur: NEGATIVE mg/dL
Specific Gravity, Urine: 1.012 (ref 1.005–1.030)
Urobilinogen, UA: 0.2 mg/dL (ref 0.0–1.0)
pH: 5 (ref 5.0–8.0)

## 2014-02-11 LAB — DIFFERENTIAL
Basophils Absolute: 0 10*3/uL (ref 0.0–0.1)
Basophils Relative: 0 % (ref 0–1)
Eosinophils Absolute: 0.2 10*3/uL (ref 0.0–0.7)
Eosinophils Relative: 2 % (ref 0–5)
Lymphocytes Relative: 21 % (ref 12–46)
Lymphs Abs: 2.2 10*3/uL (ref 0.7–4.0)
Monocytes Absolute: 0.7 10*3/uL (ref 0.1–1.0)
Monocytes Relative: 6 % (ref 3–12)
Neutro Abs: 7.6 10*3/uL (ref 1.7–7.7)
Neutrophils Relative %: 71 % (ref 43–77)

## 2014-02-11 LAB — COMPREHENSIVE METABOLIC PANEL
ALT: 18 U/L (ref 0–35)
AST: 19 U/L (ref 0–37)
Albumin: 3.2 g/dL — ABNORMAL LOW (ref 3.5–5.2)
Alkaline Phosphatase: 55 U/L (ref 39–117)
BUN: 73 mg/dL — ABNORMAL HIGH (ref 6–23)
CO2: 17 mEq/L — ABNORMAL LOW (ref 19–32)
Calcium: 9.2 mg/dL (ref 8.4–10.5)
Chloride: 102 mEq/L (ref 96–112)
Creatinine, Ser: 4.28 mg/dL — ABNORMAL HIGH (ref 0.50–1.10)
GFR calc Af Amer: 10 mL/min — ABNORMAL LOW (ref >90)
GFR calc non Af Amer: 9 mL/min — ABNORMAL LOW (ref >90)
Glucose, Bld: 154 mg/dL — ABNORMAL HIGH (ref 70–99)
Potassium: 5.3 mEq/L (ref 3.7–5.3)
Sodium: 137 mEq/L (ref 137–147)
Total Bilirubin: 0.4 mg/dL (ref 0.3–1.2)
Total Protein: 6.6 g/dL (ref 6.0–8.3)

## 2014-02-11 LAB — CBC
HCT: 35 % — ABNORMAL LOW (ref 36.0–46.0)
Hemoglobin: 11.5 g/dL — ABNORMAL LOW (ref 12.0–15.0)
MCH: 28.7 pg (ref 26.0–34.0)
MCHC: 32.9 g/dL (ref 30.0–36.0)
MCV: 87.3 fL (ref 78.0–100.0)
Platelets: 170 10*3/uL (ref 150–400)
RBC: 4.01 MIL/uL (ref 3.87–5.11)
RDW: 13.6 % (ref 11.5–15.5)
WBC: 10.6 10*3/uL — ABNORMAL HIGH (ref 4.0–10.5)

## 2014-02-11 LAB — TROPONIN I: Troponin I: <0.3 ng/mL (ref <0.30)

## 2014-02-11 LAB — APTT: aPTT: 27 seconds (ref 24–37)

## 2014-02-11 LAB — CBG MONITORING, ED: Glucose-Capillary: 150 mg/dL — ABNORMAL HIGH (ref 70–99)

## 2014-02-11 MED ORDER — SODIUM CHLORIDE 0.9 % IV BOLUS (SEPSIS)
1000.0000 mL | Freq: Once | INTRAVENOUS | Status: AC
  Administered 2014-02-11: 1000 mL via INTRAVENOUS

## 2014-02-11 MED ORDER — SODIUM CHLORIDE 0.9 % IV SOLN
1000.0000 mL | INTRAVENOUS | Status: DC
  Administered 2014-02-11: 1000 mL via INTRAVENOUS

## 2014-02-11 MED ORDER — SODIUM CHLORIDE 0.9 % IV SOLN: INTRAVENOUS | Status: DC

## 2014-02-11 MED ORDER — SODIUM CHLORIDE 0.9 % IV SOLN
1000.0000 mL | Freq: Once | INTRAVENOUS | Status: AC
  Administered 2014-02-11: 1000 mL via INTRAVENOUS

## 2014-02-11 MED ORDER — DEXTROSE 5 % IV SOLN
1.0000 g | INTRAVENOUS | Status: DC
Start: 2014-02-11 — End: 2014-02-13
  Administered 2014-02-11 – 2014-02-12 (×2): 1 g via INTRAVENOUS
  Filled 2014-02-11 (×2): qty 10

## 2014-02-11 NOTE — ED Notes (Addendum)
Pt presents to ed with c/o fall and uti. Per family pt fell this afternoon after loosing balance, hit back of her head. Pt slightly hypotensive in triage. Pt also reports blurred vision. CBG in triage 150.

## 2014-02-11 NOTE — ED Provider Notes (Signed)
CSN: [id number]     Arrival date & time 02/11/14  1711 History   First MD Initiated Contact with Patient 02/11/14 [place]     Chief Complaint  Patient presents with  . Fall  . Urinary Tract Infection    HPI Pt was walking to the bathroom with her walker.  She lost her balance and fell against the door and then onto the floor.  She landed on her back.  She did hit the back of her head and neck.  She is having pain primarily in her tailbone.  In the past she has had trouble with back pain and sciatica. She also has chronic issues with a tremor and her balance.  Pt also noticed that her balance is worse than usual.  She did recently start cipro and wonders if it is contributing.  She does not feel like she can walk well.  She was on the floor for 30 minutes before she was able to get help today.  No speech trouble.  No trouble with her visions.  No focal numbness or weakness.  Past Medical History  Diagnosis Date  . Diabetes mellitus   . Hypertension   . Hyperlipemia   . Arthritis   . Cancer     skin  . Staph infection   . Essential and other specified forms of tremor 05/03/2013  . Cholelithiasis   . Chronic insomnia   . Chronic low back pain   . Obesity   . Peripheral edema   . Diabetic peripheral neuropathy   . Wrist fracture     left October 2014   Past Surgical History  Procedure Laterality Date  . Abdominal hysterectomy    . Hernia repair    . Total knee arthroplasty    . Back surgery    . Cataract extraction    . Oophorectomy    . Neck surgery     Family History  Problem Relation Age of Onset  . Cancer Brother     prostate  . Heart disease Brother   . Alzheimer's disease Brother   . Stroke Mother   . Hypertension Mother   . COPD Brother   . Cancer Brother     Prostate cancer  . Hypertension Sister     Congestive heart failure   History  Substance Use Topics  . Smoking status: Never Smoker   . Smokeless tobacco: Never Used  . Alcohol Use: No   OB History    Grav Para Term Preterm Abortions TAB SAB Ect Mult Living                 Review of Systems  All other systems reviewed and are negative.     Allergies  Codeine; Dilaudid; Ciprofloxacin; and Mysoline  Home Medications   Prior to Admission medications   Medication Sig Start Date End Date Taking? Authorizing Provider  BAYER CONTOUR NEXT TEST test strip 1 each by Other route 3 (three) times daily. 07/24/13  Yes Historical Provider, MD  chlorthalidone (HYGROTON) 25 MG tablet Take 25 mg by mouth daily. 04/17/13  Yes Historical Provider, MD  ciprofloxacin (CIPRO) 500 MG tablet Take 1 tablet (500 mg total) by mouth 2 (two) times daily. One po bid x 5 days 02/08/14  Yes Mariea Clonts, MD  cloNIDine (CATAPRES) 0.1 MG tablet Take 0.1 mg by mouth 2 (two) times daily.     Yes Historical Provider, MD  gabapentin (NEURONTIN) 400 MG capsule Take 400 mg by mouth 3 (three)  times daily.   Yes Historical Provider, MD  insulin glargine (LANTUS) 100 UNIT/ML injection Inject 65 Units into the skin at bedtime.    Yes Historical Provider, MD  losartan (COZAAR) 100 MG tablet Take 100 mg by mouth daily.     Yes Historical Provider, MD  metFORMIN (GLUCOPHAGE) 1000 MG tablet Take 1,000 mg by mouth 2 (two) times daily with a meal.     Yes Historical Provider, MD  pravastatin (PRAVACHOL) 40 MG tablet Take 40 mg by mouth at bedtime.  10/01/11  Yes Historical Provider, MD  propranolol (INDERAL) 10 MG tablet Take 20 mg by mouth 2 (two) times daily. 1 tablet twice daily for 2 weeks, then take 2 tablets twice daily 01/16/14  Yes Dennie Bible, NP  spironolactone (ALDACTONE) 50 MG tablet Take 50 mg by mouth daily. 04/17/13  Yes Historical Provider, MD  TRADJENTA 5 MG TABS tablet Take 5 mg by mouth daily. 07/24/13  Yes Historical Provider, MD   BP 91/46  Pulse 88  Temp(Src) 97.6 F (36.4 C) (Oral)  Resp 20  SpO2 98% Physical Exam  Nursing note and vitals reviewed. Constitutional: She is oriented to person, place,  and time. She appears well-developed and well-nourished. No distress.  HENT:  Head: Normocephalic and atraumatic.  Right Ear: External ear normal.  Left Ear: External ear normal.  Mouth/Throat: Oropharynx is clear and moist.  Eyes: Conjunctivae are normal. Right eye exhibits no discharge. Left eye exhibits no discharge. No scleral icterus.  Neck: Neck supple. No tracheal deviation present.  Cardiovascular: Normal rate, regular rhythm and intact distal pulses.   Pulmonary/Chest: Effort normal and breath sounds normal. No stridor. No respiratory distress. She has no wheezes. She has no rales.  Abdominal: Soft. Bowel sounds are normal. She exhibits no distension. There is no tenderness. There is no rebound and no guarding.  Musculoskeletal: She exhibits no edema.       Lumbar back: She exhibits tenderness and bony tenderness.  Sacrum coccyx region is tender  Neurological: She is alert and oriented to person, place, and time. She has normal strength. She displays tremor. No cranial nerve deficit (no facial droop, extraocular movements intact, no slurred speech) or sensory deficit. She exhibits normal muscle tone. She displays no seizure activity. Coordination normal.  No pronator drift bilateral upper extrem, able to hold both legs off bed for 5 seconds, sensation intact in all extremities, no visual field cuts, no left or right sided neglect, normal finger-nose exam bilaterally, no nystagmus noted   Skin: Skin is warm and dry. No rash noted.  Psychiatric: She has a normal mood and affect.    ED Course  Procedures (including critical care time)  CRITICAL CARE Performed by: Dorie Rank Total critical care time: 35 Critical care time was exclusive of separately billable procedures and treating other patients. Critical care was necessary to treat or prevent imminent or life-threatening deterioration. Critical care was time spent personally by me on the following activities: development of  treatment plan with patient and/or surrogate as well as nursing, discussions with consultants, evaluation of patient's response to treatment, examination of patient, obtaining history from patient or surrogate, ordering and performing treatments and interventions, ordering and review of laboratory studies, ordering and review of radiographic studies, pulse oximetry and re-evaluation of patient's condition.  Labs Review Labs Reviewed  CBC - Abnormal; Notable for the following:    WBC 10.6 (*)    Hemoglobin 11.5 (*)    HCT 35.0 (*)  All other components within normal limits  COMPREHENSIVE METABOLIC PANEL - Abnormal; Notable for the following:    CO2 17 (*)    Glucose, Bld 154 (*)    BUN 73 (*)    Creatinine, Ser 4.28 (*)    Albumin 3.2 (*)    GFR calc non Af Amer 9 (*)    GFR calc Af Amer 10 (*)    All other components within normal limits  URINALYSIS, ROUTINE W REFLEX MICROSCOPIC - Abnormal; Notable for the following:    APPearance CLOUDY (*)    Hgb urine dipstick TRACE (*)    Leukocytes, UA LARGE (*)    All other components within normal limits  URINE MICROSCOPIC-ADD ON - Abnormal; Notable for the following:    Bacteria, UA FEW (*)    All other components within normal limits  CBG MONITORING, ED - Abnormal; Notable for the following:    Glucose-Capillary 150 (*)    All other components within normal limits  PROTIME-INR  APTT  DIFFERENTIAL  TROPONIN I    Imaging Review Dg Sacrum/coccyx  02/11/2014   CLINICAL DATA:  Status post fall.  Tailbone pain.  EXAM: SACRUM AND COCCYX - 2+ VIEW  COMPARISON:  None.  FINDINGS: No acute bony or joint abnormality is identified. Degenerative disc disease lower lumbar spine is seen.  IMPRESSION: No acute finding.   Electronically Signed   By: Inge Rise M.D.   On: 02/11/2014 19:48   Ct Head Wo Contrast  02/11/2014   CLINICAL DATA:  Status post fall.  Blow to the back of the head.  EXAM: CT HEAD WITHOUT CONTRAST  TECHNIQUE: Contiguous axial  images were obtained from the base of the skull through the vertex without intravenous contrast.  COMPARISON:  Head CT scan 09/11/2013.  FINDINGS: The brain appears normal without infarct, hemorrhage, mass lesion, mass effect, midline shift or abnormal extra-axial fluid collection. No hydrocephalus or pneumocephalus. The calvarium is intact. Imaged paranasal sinuses and mastoid air cells are clear.  IMPRESSION: Negative head CT.   Electronically Signed   By: Inge Rise M.D.   On: 02/11/2014 20:11     EKG Interpretation   Date/Time:  Sunday February 11 2014 19:12:41 EDT Ventricular Rate:  86 PR Interval:  233 QRS Duration: 74 QT Interval:  352 QTC Calculation: 421 R Axis:   6 Text Interpretation:  Sinus rhythm Prolonged PR interval Low voltage,  precordial leads Baseline wander in lead(s) I ** Poor data quality,  interpretation may be adversely affected Confirmed by Jisselle Poth  MD-J, Hetvi Shawhan  ([id number]) on 02/11/2014 7:16:55 PM     Medications  0.9 %  sodium chloride infusion (0 mLs Intravenous Stopped 02/11/14 2154)    Followed by  0.9 %  sodium chloride infusion (0 mLs Intravenous Stopped 02/11/14 2154)  cefTRIAXone (ROCEPHIN) 1 g in dextrose 5 % 50 mL IVPB (not administered)  sodium chloride 0.9 % bolus 1,000 mL (1,000 mLs Intravenous New Bag/Given 02/11/14 2154)    MDM   Final diagnoses:  Acute renal failure  Urinary tract infection    The patient's laboratory tests reveal acute renal failure. Her creatinine has increased significantly from blood testing earlier this month. Patient also has a urinary tract infection. She was started on antibiotics after her previous evaluation in another emergency department. I reviewed the urine culture results but unfortunately it showed numerous species but no definitive urinary pathogen.  Will continue IV fluid hydration. Patient will be started on IV antibiotics. I have ordered a  renal ultrasound to rule out any obstructive process.   Dorie Rank,  MD 02/14/14 [place]

## 2014-02-12 ENCOUNTER — Encounter (HOSPITAL_COMMUNITY): Payer: Self-pay | Admitting: Internal Medicine

## 2014-02-12 DIAGNOSIS — I10 Essential (primary) hypertension: Secondary | ICD-10-CM | POA: Diagnosis present

## 2014-02-12 DIAGNOSIS — G250 Essential tremor: Secondary | ICD-10-CM

## 2014-02-12 DIAGNOSIS — E119 Type 2 diabetes mellitus without complications: Secondary | ICD-10-CM

## 2014-02-12 DIAGNOSIS — N390 Urinary tract infection, site not specified: Secondary | ICD-10-CM

## 2014-02-12 DIAGNOSIS — N179 Acute kidney failure, unspecified: Principal | ICD-10-CM

## 2014-02-12 DIAGNOSIS — G252 Other specified forms of tremor: Secondary | ICD-10-CM

## 2014-02-12 LAB — COMPREHENSIVE METABOLIC PANEL WITH GFR
ALT: 14 U/L (ref 0–35)
AST: 17 U/L (ref 0–37)
Albumin: 2.9 g/dL — ABNORMAL LOW (ref 3.5–5.2)
Alkaline Phosphatase: 50 U/L (ref 39–117)
BUN: 65 mg/dL — ABNORMAL HIGH (ref 6–23)
CO2: 22 meq/L (ref 19–32)
Calcium: 8.7 mg/dL (ref 8.4–10.5)
Chloride: 108 meq/L (ref 96–112)
Creatinine, Ser: 2.9 mg/dL — ABNORMAL HIGH (ref 0.50–1.10)
GFR calc Af Amer: 17 mL/min — ABNORMAL LOW
GFR calc non Af Amer: 14 mL/min — ABNORMAL LOW
Glucose, Bld: 163 mg/dL — ABNORMAL HIGH (ref 70–99)
Potassium: 5 meq/L (ref 3.7–5.3)
Sodium: 141 meq/L (ref 137–147)
Total Bilirubin: 0.4 mg/dL (ref 0.3–1.2)
Total Protein: 5.8 g/dL — ABNORMAL LOW (ref 6.0–8.3)

## 2014-02-12 LAB — URINE CULTURE
Colony Count: NO GROWTH
Culture: NO GROWTH

## 2014-02-12 LAB — IRON AND TIBC
Iron: 34 ug/dL — ABNORMAL LOW (ref 42–135)
Saturation Ratios: 13 % — ABNORMAL LOW (ref 20–55)
TIBC: 269 ug/dL (ref 250–470)
UIBC: 235 ug/dL (ref 125–400)

## 2014-02-12 LAB — SODIUM, URINE, RANDOM: Sodium, Ur: 52 mEq/L

## 2014-02-12 LAB — GLUCOSE, CAPILLARY
Glucose-Capillary: 161 mg/dL — ABNORMAL HIGH (ref 70–99)
Glucose-Capillary: 139 mg/dL — ABNORMAL HIGH (ref 70–99)
Glucose-Capillary: 135 mg/dL — ABNORMAL HIGH (ref 70–99)
Glucose-Capillary: 152 mg/dL — ABNORMAL HIGH (ref 70–99)

## 2014-02-12 LAB — FERRITIN: Ferritin: 115 ng/mL (ref 10–291)

## 2014-02-12 LAB — RETICULOCYTES
RBC.: 3.59 MIL/uL — ABNORMAL LOW (ref 3.87–5.11)
Retic Count, Absolute: 75.4 10*3/uL (ref 19.0–186.0)
Retic Ct Pct: 2.1 % (ref 0.4–3.1)

## 2014-02-12 LAB — LIPASE, BLOOD: Lipase: 71 U/L — ABNORMAL HIGH (ref 11–59)

## 2014-02-12 LAB — CREATININE, URINE, RANDOM: Creatinine, Urine: 31.97 mg/dL

## 2014-02-12 LAB — VITAMIN B12: Vitamin B-12: 304 pg/mL (ref 211–911)

## 2014-02-12 LAB — CK: Total CK: 285 U/L — ABNORMAL HIGH (ref 7–177)

## 2014-02-12 LAB — FOLATE: Folate: 19.1 ng/mL

## 2014-02-12 MED ORDER — CLONIDINE HCL 0.1 MG PO TABS
0.1000 mg | ORAL_TABLET | Freq: Two times a day (BID) | ORAL | Status: DC
  Administered 2014-02-12 – 2014-02-13 (×4): 0.1 mg via ORAL
  Filled 2014-02-12 (×5): qty 1

## 2014-02-12 MED ORDER — ONDANSETRON HCL 4 MG/2ML IJ SOLN: 4.0000 mg | Freq: Four times a day (QID) | INTRAMUSCULAR | Status: DC | PRN

## 2014-02-12 MED ORDER — INSULIN GLARGINE 100 UNIT/ML ~~LOC~~ SOLN
30.0000 [IU] | Freq: Every day | SUBCUTANEOUS | Status: DC
  Administered 2014-02-12 (×2): 30 [IU] via SUBCUTANEOUS
  Filled 2014-02-12 (×2): qty 0.3

## 2014-02-12 MED ORDER — HYDRALAZINE HCL 20 MG/ML IJ SOLN
10.0000 mg | INTRAMUSCULAR | Status: DC | PRN
  Filled 2014-02-12: qty 0.5

## 2014-02-12 MED ORDER — ACETAMINOPHEN 325 MG PO TABS
650.0000 mg | ORAL_TABLET | Freq: Four times a day (QID) | ORAL | Status: DC | PRN
  Administered 2014-02-12 (×2): 650 mg via ORAL
  Filled 2014-02-12 (×2): qty 2

## 2014-02-12 MED ORDER — ENOXAPARIN SODIUM 30 MG/0.3ML ~~LOC~~ SOLN
30.0000 mg | SUBCUTANEOUS | Status: DC
  Administered 2014-02-12 – 2014-02-13 (×2): 30 mg via SUBCUTANEOUS
  Filled 2014-02-12 (×2): qty 0.3

## 2014-02-12 MED ORDER — ONDANSETRON HCL 4 MG PO TABS: 4.0000 mg | ORAL_TABLET | Freq: Four times a day (QID) | ORAL | Status: DC | PRN

## 2014-02-12 MED ORDER — ACETAMINOPHEN 650 MG RE SUPP: 650.0000 mg | Freq: Four times a day (QID) | RECTAL | Status: DC | PRN

## 2014-02-12 MED ORDER — INSULIN ASPART 100 UNIT/ML ~~LOC~~ SOLN
0.0000 [IU] | Freq: Three times a day (TID) | SUBCUTANEOUS | Status: DC
  Administered 2014-02-12: 1 [IU] via SUBCUTANEOUS
  Administered 2014-02-12: 2 [IU] via SUBCUTANEOUS
  Administered 2014-02-12: 1 [IU] via SUBCUTANEOUS

## 2014-02-12 MED ORDER — GABAPENTIN 400 MG PO CAPS
400.0000 mg | ORAL_CAPSULE | Freq: Three times a day (TID) | ORAL | Status: DC
  Administered 2014-02-12 – 2014-02-13 (×2): 400 mg via ORAL
  Filled 2014-02-12 (×4): qty 1

## 2014-02-12 MED ORDER — SODIUM CHLORIDE 0.9 % IV SOLN
INTRAVENOUS | Status: AC
  Administered 2014-02-12 (×2): via INTRAVENOUS

## 2014-02-12 MED ORDER — PROPRANOLOL HCL 20 MG PO TABS
20.0000 mg | ORAL_TABLET | Freq: Two times a day (BID) | ORAL | Status: DC
  Administered 2014-02-12 – 2014-02-13 (×4): 20 mg via ORAL
  Filled 2014-02-12 (×5): qty 1

## 2014-02-12 MED ORDER — SODIUM CHLORIDE 0.9 % IJ SOLN
3.0000 mL | Freq: Two times a day (BID) | INTRAMUSCULAR | Status: DC
  Administered 2014-02-13: 3 mL via INTRAVENOUS

## 2014-02-12 MED ORDER — SIMVASTATIN 40 MG PO TABS
40.0000 mg | ORAL_TABLET | Freq: Every day | ORAL | Status: DC
  Filled 2014-02-12: qty 1

## 2014-02-12 NOTE — H&P (Addendum)
[facility] History and Physical  [name] WGN:[id number] DOB: 1933/04/19 DOA: 02/11/2014  Referring physician: ER physician. PCP: Milagros Evener, MD   Chief Complaint: Weakness and falls.  HPI: [name] is a 80 y.o. female with history of hypertension, diabetes mellitus, hyperlipidemia, chronic back pain had come to the ER 3 days ago because of urinary incontinence and nausea. At that time labs showed mildly elevated lipase and sonogram of the abdomen done showed gallbladder stones but no depressed signs of cholecystitis. Since patient had urinary incontinence MRI of the L-spine was done which did not show any signs of cord compression. Patient was discharged home on Cipro for UTI. Patient since started taking Cipro was getting imbalance and difficulty walk and did not take any Cipro yesterday and was feeling weak and had a fall. Did not lose consciousness or have any chest pain shortness of breath or any diarrhea. Patient's nausea has improved. Patient did not have any further episodes of urinary incontinence. As per the patient's daughter patient has not been making much urine last 2 days. In the ER patient was found to have creatinine around 4, 4 times from what it was 2 days ago. Urine analysis done today shows features concerning for UTI was started on ceftriaxone and IV fluids and admitted for further management of acute renal failure and UTI. Patient states that she used to take NSAIDs for her low back pain until last month and was placed on prednisone tapering dose by patient's neurosurgeon for low back pain but patient did not complete the course because prednisone increasing patient's blood sugar. Patient has not taken any other new medications except for Cipro. Patient denies any itching or any signs of angioedema or anaphylaxis.   Review of Systems: As presented in the history of presenting illness, rest negative.  Past Medical History  Diagnosis Date  . Diabetes  mellitus   . Hypertension   . Hyperlipemia   . Arthritis   . Cancer     skin  . Staph infection   . Essential and other specified forms of tremor 05/03/2013  . Cholelithiasis   . Chronic insomnia   . Chronic low back pain   . Obesity   . Peripheral edema   . Diabetic peripheral neuropathy   . Wrist fracture     left October 2014   Past Surgical History  Procedure Laterality Date  . Abdominal hysterectomy    . Hernia repair    . Total knee arthroplasty    . Back surgery    . Cataract extraction    . Oophorectomy    . Neck surgery     Social History:  reports that she has never smoked. She has never used smokeless tobacco. She reports that she does not drink alcohol or use illicit drugs. Where does patient live home. Can patient participate in ADLs? Yes.  Allergies  Allergen Reactions  . Codeine Nausea And Vomiting  . Dilaudid [Hydromorphone Hcl] Nausea And Vomiting and Other (See Comments)    Severe sweating  . Ciprofloxacin     Loss of balance.    . Mysoline [Primidone]     Nausea    Family History:  Family History  Problem Relation Age of Onset  . Cancer Brother     prostate  . Heart disease Brother   . Alzheimer's disease Brother   . Stroke Mother   . Hypertension Mother   . COPD Brother   . Cancer Brother     Prostate  cancer  . Hypertension Sister     Congestive heart failure      Prior to Admission medications   Medication Sig Start Date End Date Taking? Authorizing Provider  BAYER CONTOUR NEXT TEST test strip 1 each by Other route 3 (three) times daily. 07/24/13  Yes Historical Provider, MD  chlorthalidone (HYGROTON) 25 MG tablet Take 25 mg by mouth daily. 04/17/13  Yes Historical Provider, MD  ciprofloxacin (CIPRO) 500 MG tablet Take 1 tablet (500 mg total) by mouth 2 (two) times daily. One po bid x 5 days 02/08/14  Yes Mariea Clonts, MD  cloNIDine (CATAPRES) 0.1 MG tablet Take 0.1 mg by mouth 2 (two) times daily.     Yes Historical Provider, MD   gabapentin (NEURONTIN) 400 MG capsule Take 400 mg by mouth 3 (three) times daily.   Yes Historical Provider, MD  insulin glargine (LANTUS) 100 UNIT/ML injection Inject 65 Units into the skin at bedtime.    Yes Historical Provider, MD  losartan (COZAAR) 100 MG tablet Take 100 mg by mouth daily.     Yes Historical Provider, MD  metFORMIN (GLUCOPHAGE) 1000 MG tablet Take 1,000 mg by mouth 2 (two) times daily with a meal.     Yes Historical Provider, MD  pravastatin (PRAVACHOL) 40 MG tablet Take 40 mg by mouth at bedtime.  10/01/11  Yes Historical Provider, MD  propranolol (INDERAL) 10 MG tablet Take 20 mg by mouth 2 (two) times daily. 1 tablet twice daily for 2 weeks, then take 2 tablets twice daily 01/16/14  Yes Dennie Bible, NP  spironolactone (ALDACTONE) 50 MG tablet Take 50 mg by mouth daily. 04/17/13  Yes Historical Provider, MD  TRADJENTA 5 MG TABS tablet Take 5 mg by mouth daily. 07/24/13  Yes Historical Provider, MD    Physical Exam: Filed Vitals:   02/11/14 2155 02/11/14 2200 02/11/14 2222 02/11/14 2345  BP:  105/47 117/45 133/63  Pulse: 77 76  80  Temp:    97.6 F (36.4 C)  TempSrc:    Oral  Resp: 21 18  16   SpO2: 98% 100%  100%     General:  Well-developed well-nourished.  Eyes: Anicteric no pallor.  ENT: No discharge from the ears eyes nose mouth.  Neck: No mass felt. JVD not appreciated.  Cardiovascular: S1-S2 heard.  Respiratory: No rhonchi or crepitations.  Abdomen: Soft nontender bowel sounds present. No guarding or rigidity.  Skin: No rash.  Musculoskeletal: Mild edema in the lower extremities which patient states is chronic.  Psychiatric: Appears normal.  Neurologic: Alert awake oriented to time place and person. Moves all extremities.  Labs on Admission:  Basic Metabolic Panel:  Recent Labs Lab 02/07/14 1705 02/11/14 1923  NA 140 137  K 4.6 5.3  CL 105 102  CO2 21 17*  GLUCOSE 69* 154*  BUN 42* 73*  CREATININE 1.40* 4.28*  CALCIUM 10.1  9.2   Liver Function Tests:  Recent Labs Lab 02/07/14 1705 02/11/14 1923  AST 21 19  ALT 10 18  ALKPHOS 53 55  BILITOT 0.5 0.4  PROT 7.5 6.6  ALBUMIN 4.2 3.2*    Recent Labs Lab 02/07/14 1705  LIPASE 60*   No results found for this basename: AMMONIA,  in the last 168 hours CBC:  Recent Labs Lab 02/07/14 1705 02/11/14 1923  WBC 12.9* 10.6*  NEUTROABS 8.1* 7.6  HGB 13.0 11.5*  HCT 37.7 35.0*  MCV 86.7 87.3  PLT 254 170   Cardiac Enzymes:  Recent  Labs Lab 02/11/14 1923  TROPONINI <0.30    BNP (last 3 results) No results found for this basename: PROBNP,  in the last 8760 hours CBG:  Recent Labs Lab 02/07/14 2102 02/07/14 2236 02/07/14 2355 02/08/14 0045 02/11/14 1749  GLUCAP 66* 105* 69* 145* 150*    Radiological Exams on Admission: Dg Sacrum/coccyx  02/11/2014   CLINICAL DATA:  Status post fall.  Tailbone pain.  EXAM: SACRUM AND COCCYX - 2+ VIEW  COMPARISON:  None.  FINDINGS: No acute bony or joint abnormality is identified. Degenerative disc disease lower lumbar spine is seen.  IMPRESSION: No acute finding.   Electronically Signed   By: Inge Rise M.D.   On: 02/11/2014 19:48   Ct Head Wo Contrast  02/11/2014   CLINICAL DATA:  Status post fall.  Blow to the back of the head.  EXAM: CT HEAD WITHOUT CONTRAST  TECHNIQUE: Contiguous axial images were obtained from the base of the skull through the vertex without intravenous contrast.  COMPARISON:  Head CT scan 09/11/2013.  FINDINGS: The brain appears normal without infarct, hemorrhage, mass lesion, mass effect, midline shift or abnormal extra-axial fluid collection. No hydrocephalus or pneumocephalus. The calvarium is intact. Imaged paranasal sinuses and mastoid air cells are clear.  IMPRESSION: Negative head CT.   Electronically Signed   By: Inge Rise M.D.   On: 02/11/2014 20:11   US Renal  02/12/2014   CLINICAL DATA:  Fall.  Urinary tract infection.  EXAM: RENAL/URINARY TRACT ULTRASOUND COMPLETE   COMPARISON:  Ultrasound abdomen 02/07/2014.  FINDINGS: Right Kidney:  Length: 10.3 cm. Diffuse parenchymal thinning. No focal mass lesion or hydronephrosis.  Left Kidney:  Length: 11.7 cm. Diffuse parenchymal thinning. No focal mass lesion or hydronephrosis.  Bladder:  Incomplete distention. No bladder wall thickening or filling defect. Bilateral urine flow jets demonstrated.  IMPRESSION: Diffuse parenchymal atrophy in both kidneys. No focal mass lesion or hydronephrosis in either kidney. Normal appearance of the bladder.   Electronically Signed   By: Lucienne Capers M.D.   On: 02/12/2014 00:10    EKG: Independently reviewed. Normal sinus rhythm with first degree AV block.  Assessment/Plan Principal Problem:   Acute renal failure Active Problems:   Essential and other specified forms of tremor   Urinary tract infection   HTN (hypertension)   Diabetes mellitus   1. Acute renal failure - probably on chronic kidney disease. Cause is not clear. Check FeNa. Hold diuretics including HCTZ and spironolactone, hold Cozaar. Discontinue Cipro. Continue with gentle hydration and closely following take output and metabolic panel. Renal sonogram is pending. 2. UTI - check urine cultures. On ceftriaxone. 3. Diabetes mellitus -  Due to acute renal failure we will decrease Lantus dose to half and hold metformin. Closely follow CBGs. 4. Gallbladder stones - presently patient abdomen appears benign and if patient is getting nauseous may need to get HIDA scan. 5. Hypertension - continue clonidine. Hold Lasix and Cozaar due to renal failure. Patient placed on clear IV hydralazine for systolic blood pressure more than 818. 6. Metabolic acidosis - from #1. Closely follow metabolic panel. 7. Tremors - continue propranolol. 8. Anemia - check anemia profile. Follow CBC. 9. Back pain - recent MRI did not show any cord compression. I'm holding off Neurontin due to renal failure and placing patient on Tylenol for now  restart Neurontin once renal function improves. 10. Hyperlipidemia - if CK is elevated then may have to hold statins.  Will monitor in telemetry overnight due to  acute renal failure.    Code Status: Full code.  Family Communication: Patient's daughter at the bedside.  Disposition Plan: Admit to inpatient.    [facility] Pager [phone].  If 7PM-7AM, please contact night-coverage [email or web address] Password [facility] 02/12/2014, 12:32 AM

## 2014-02-12 NOTE — Progress Notes (Signed)
ANTIBIOTIC CONSULT NOTE - INITIAL  Pharmacy Consult for Rocephin Indication: UTI  Allergies  Allergen Reactions  . Codeine Nausea And Vomiting  . Dilaudid [Hydromorphone Hcl] Nausea And Vomiting and Other (See Comments)    Severe sweating  . Ciprofloxacin     Loss of balance.    . Mysoline [Primidone]     Nausea    Patient Measurements:   Wt=104 kg  Vital Signs: Temp: 97.6 F (36.4 C) (06/07 2345) Temp src: Oral (06/07 2345) BP: 133/63 mmHg (06/07 2345) Pulse Rate: 80 (06/07 2345) Intake/Output from previous day:   Intake/Output from this shift:    Labs:  Recent Labs  02/11/14 1923  WBC 10.6*  HGB 11.5*  PLT 170  CREATININE 4.28*   The CrCl is unknown because both a height and weight (above a minimum accepted value) are required for this calculation. No results found for this basename: VANCOTROUGH, Corlis Leak, VANCORANDOM, GENTTROUGH, GENTPEAK, GENTRANDOM, TOBRATROUGH, TOBRAPEAK, TOBRARND, AMIKACINPEAK, AMIKACINTROU, AMIKACIN,  in the last 72 hours   Microbiology: Recent Results (from the past 720 hour(s))  URINE CULTURE     Status: None   Collection Time    02/07/14  4:29 PM      Result Value Ref Range Status   Specimen Description URINE, CLEAN CATCH   Final   Special Requests Normal   Final   Culture  Setup Time     Final   Value: 02/08/2014 00:26     Performed at [facility]     Final   Value: >=100,000 COLONIES/ML     Performed at [facility]   Culture     Final   Value: Multiple bacterial morphotypes present, none predominant. Suggest appropriate recollection if clinically indicated.     Performed at [facility]   Report Status 02/08/2014 FINAL   Final    Medical History: Past Medical History  Diagnosis Date  . Diabetes mellitus   . Hypertension   . Hyperlipemia   . Arthritis   . Cancer     skin  . Staph infection   . Essential and other specified forms of tremor 05/03/2013  . Cholelithiasis   .  Chronic insomnia   . Chronic low back pain   . Obesity   . Peripheral edema   . Diabetic peripheral neuropathy   . Wrist fracture     left October 2014    Medications:  Scheduled:  . cefTRIAXone (ROCEPHIN)  IV  1 g Intravenous Q24H  . cloNIDine  0.1 mg Oral BID  . enoxaparin (LOVENOX) injection  30 mg Subcutaneous Q24H  . insulin aspart  0-9 Units Subcutaneous TID WC  . insulin glargine  30 Units Subcutaneous QHS  . propranolol  20 mg Oral BID  . simvastatin  40 mg Oral q1800  . sodium chloride  3 mL Intravenous Q12H   Infusions:  . sodium chloride     Assessment: 80 yo c/o weakness/falls found to have UTI.  Rocephin per Rx.  Goal of Therapy:  Treat infection  Plan:   Rocephin 1gm IV q24h  F/u cultures as needed  Dorrene German 02/12/2014,12:49 AM

## 2014-02-12 NOTE — Progress Notes (Signed)
Patient ID: [name]  female  DVV:[id number]    DOB: 13-Oct-1932    DOA: 02/11/2014  PCP: Milagros Evener, MD  Assessment/Plan: Principal Problem:   Acute renal failure, baseline creatinine 1.0 in 10 to thousand and - Possibly underlying chronic kidney disease, continue to hold HCTZ, spironolactone, Cozaar, discontinued Cipro, creatinine improving - Renal ultrasound done, no hydronephrosis or mass lesion, diffuse parenchymal atrophy in both kidneys suggest possible underlying chronic kidney disease, likely due to diabetes mellitus, hypertensive nephropathy  Active Problems:   Essential and other specified forms of tremor: Currently stable     Urinary tract infection -Urine culture pending, continue Rocephin     HTN (hypertension) - Currently stable    Diabetes mellitus - Continue sliding scale, Lantus   DVT Prophylaxis:  Code Status:  Family Communication:Patient alert and oriented x4, discussed with the patient   Disposition:  Consultants:  None   Procedures:  None   Antibiotics:  IV Rocephin    Subjective: Patient seen and examined, sitting up in the chair, denies any specific complaints, feeling a lot better from yesterday   Objective: Weight change:   Intake/Output Summary (Last 24 hours) at 02/12/14 1308 Last data filed at 02/12/14 0829  Gross per 24 hour  Intake   1025 ml  Output   1600 ml  Net   -575 ml   Blood pressure 108/57, pulse 72, temperature 97.3 F (36.3 C), temperature source Oral, resp. rate 18, height 5\' 9"  (1.753 m), weight 107 kg (235 lb 14.3 oz), SpO2 98.00%.  Physical Exam: General: Alert and awake, oriented x3, not in any acute distress. CVS: S1-S2 clear, no murmur rubs or gallops Chest: clear to auscultation bilaterally, no wheezing, rales or rhonchi Abdomen: soft nontender, nondistended, normal bowel sounds  Extremities: no cyanosis, clubbing or edema noted bilaterally Neuro: Cranial nerves II-XII intact, no focal  neurological deficits  Lab Results: Basic Metabolic Panel:  Recent Labs Lab 02/11/14 1923 02/12/14 0418  NA 137 141  K 5.3 5.0  CL 102 108  CO2 17* 22  GLUCOSE 154* 163*  BUN 73* 65*  CREATININE 4.28* 2.90*  CALCIUM 9.2 8.7   Liver Function Tests:  Recent Labs Lab 02/11/14 1923 02/12/14 0418  AST 19 17  ALT 18 14  ALKPHOS 55 50  BILITOT 0.4 0.4  PROT 6.6 5.8*  ALBUMIN 3.2* 2.9*    Recent Labs Lab 02/07/14 1705 02/12/14 0418  LIPASE 60* 71*   No results found for this basename: AMMONIA,  in the last 168 hours CBC:  Recent Labs Lab 02/07/14 1705 02/11/14 1923  WBC 12.9* 10.6*  NEUTROABS 8.1* 7.6  HGB 13.0 11.5*  HCT 37.7 35.0*  MCV 86.7 87.3  PLT 254 170   Cardiac Enzymes:  Recent Labs Lab 02/11/14 1923 02/12/14 0418  CKTOTAL  --  285*  TROPONINI <0.30  --    BNP: No components found with this basename: POCBNP,  CBG:  Recent Labs Lab 02/08/14 0045 02/11/14 1749 02/12/14 0124 02/12/14 0727 02/12/14 1132  GLUCAP 145* 150* 161* 139* 135*     Micro Results: Recent Results (from the past 240 hour(s))  URINE CULTURE     Status: None   Collection Time    02/07/14  4:29 PM      Result Value Ref Range Status   Specimen Description URINE, CLEAN CATCH   Final   Special Requests Normal   Final   Culture  Setup Time     Final   Value:  02/08/2014 00:26     Performed at [place]     Final   Value: >=100,000 COLONIES/ML     Performed at [facility]   Culture     Final   Value: Multiple bacterial morphotypes present, none predominant. Suggest appropriate recollection if clinically indicated.     Performed at [facility]   Report Status 02/08/2014 FINAL   Final    Studies/Results: Dg Sacrum/coccyx  02/11/2014   CLINICAL DATA:  Status post fall.  Tailbone pain.  EXAM: SACRUM AND COCCYX - 2+ VIEW  COMPARISON:  None.  FINDINGS: No acute bony or joint abnormality is identified. Degenerative disc  disease lower lumbar spine is seen.  IMPRESSION: No acute finding.   Electronically Signed   By: Inge Rise M.D.   On: 02/11/2014 19:48   Ct Head Wo Contrast  02/11/2014   CLINICAL DATA:  Status post fall.  Blow to the back of the head.  EXAM: CT HEAD WITHOUT CONTRAST  TECHNIQUE: Contiguous axial images were obtained from the base of the skull through the vertex without intravenous contrast.  COMPARISON:  Head CT scan 09/11/2013.  FINDINGS: The brain appears normal without infarct, hemorrhage, mass lesion, mass effect, midline shift or abnormal extra-axial fluid collection. No hydrocephalus or pneumocephalus. The calvarium is intact. Imaged paranasal sinuses and mastoid air cells are clear.  IMPRESSION: Negative head CT.   Electronically Signed   By: Inge Rise M.D.   On: 02/11/2014 20:11   Mr Lumbar Spine Wo Contrast  02/07/2014   CLINICAL DATA:  LBP, urinary incontinence  EXAM: MRI LUMBAR SPINE WITHOUT CONTRAST  TECHNIQUE: Multiplanar, multisequence MR imaging of the lumbar spine was performed. No intravenous contrast was administered.  COMPARISON:  Prior radiograph from 02/02/2014  FINDINGS: For the purposes of this dictation, the lowest well-formed intervertebral disc spaces presumed to be the L5-S1 level, and there presumed to be 5 lumbar type vertebral bodies.  The vertebral bodies are normally aligned with preservation of the normal lumbar lordosis. Vertebral body heights are well maintained.  Signal intensity within the vertebral body bone marrow and visualized spinal cord is normal. The conus medullaris terminates normally at the L1 level.  T11-12: Seen only on sagittal projection. No significant disc bulge or disc protrusion. No canal or neural foraminal stenosis.  T12-L1: Seen only on sagittal projection. No significant disc bulge or disc protrusion. No canal or neural foraminal stenosis.  L1-2: No disc bulge or disc protrusion. Mild bilateral facet hypertrophy. No canal or neural  foraminal stenosis.  L2-3: Mild diffuse degenerative disc desiccation without significant disc bulge. No focal disc protrusion. Prominent anterior bridging osteophytes noted at this level. There is moderate bilateral facet hypertrophy resulting in mild stenosis of the lateral recesses bilaterally. No significant canal or foraminal stenosis.  L3-4: There is diffuse degenerative disc desiccation with minimal disc bulge. Moderate bilateral facet hypertrophy present. There is shortening of the pedicles at this level. There is resultant moderate canal with mild bilateral foraminal stenosis.  L4-5: The patient is status post right hemi laminectomy at this level. Degenerative disc bulge with intervertebral disk space narrowing and disk desiccation. There is mild bilateral facet hypertrophy minimal canal narrowing present at this level. There is mild left foraminal stenosis.  L5-S1: There is mild diffuse degenerative disc bulge with tiny superimposed central disc protrusion. No significant canal stenosis. There is mild right foraminal narrowing.  Asymmetric fatty atrophy noted within the right paraspinous musculature within  the lower lumbosacral spine. No paraspinous soft tissue mass or other acute abnormality. Visualized visceral structures are unremarkable. Visualized intra-abdominal aorta is of normal caliber without evidence of aneurysm. No retroperitoneal adenopathy.  IMPRESSION: 1. Postoperative changes from prior decompressive right hemi laminectomy at L4-5 2. Right foraminal disc protrusion at L4-5 with resultant moderate right foraminal stenosis without significant canal narrowing. 3. Mild degenerative disc bulge with prominent facet hypertrophy at L3-4, which superimposed on short pedicles at this level results in moderate canal and mild bilateral foraminal stenosis. 4. Additional mild degenerative changes as above.   Electronically Signed   By: Jeannine Boga M.D.   On: 02/07/2014 22:48   US Abdomen  Complete  02/07/2014   CLINICAL DATA:  Nausea and right upper quadrant abdominal pain. Known cholelithiasis.  EXAM: ULTRASOUND ABDOMEN COMPLETE  COMPARISON:  06/03/2011 and CT on 05/01/2011.  FINDINGS: Gallbladder:  Shadowing gallstone in the gallbladder measures approximately 2 cm. There is no associated gallbladder wall thickening, sonographic Murphy sign or pericholecystic fluid.  Common bile duct:  Diameter: Normal caliber of 2 mm  Liver:  The liver demonstrates coarse echotexture and increased echogenicity, likely reflecting diffuse steatosis. No overt cirrhotic contour abnormalities or focal lesions are identified. There is no evidence of intrahepatic biliary ductal dilatation. The portal vein is open. Stable 1 cm cyst of the subcapsular lateral left lobe appears benign.  IVC:  No abnormality visualized.  Pancreas:  Visualized portion unremarkable.  Spleen:  Size and appearance within normal limits.  Right Kidney:  Length: 11.5 cm. Echogenicity within normal limits. No mass or hydronephrosis visualized.  Left Kidney:  Length: 11.8 cm. Echogenicity within normal limits. No mass or hydronephrosis visualized.  Abdominal aorta:  No aneurysm visualized.  Other findings:  None.  IMPRESSION: 1. Stable cholelithiasis. No evidence of cholecystitis or biliary obstruction by ultrasound. 2. Hepatic steatosis. 3. Stable small hepatic cyst.   Electronically Signed   By: Aletta Edouard M.D.   On: 02/07/2014 18:25   US Renal  02/12/2014   CLINICAL DATA:  Fall.  Urinary tract infection.  EXAM: RENAL/URINARY TRACT ULTRASOUND COMPLETE  COMPARISON:  Ultrasound abdomen 02/07/2014.  FINDINGS: Right Kidney:  Length: 10.3 cm. Diffuse parenchymal thinning. No focal mass lesion or hydronephrosis.  Left Kidney:  Length: 11.7 cm. Diffuse parenchymal thinning. No focal mass lesion or hydronephrosis.  Bladder:  Incomplete distention. No bladder wall thickening or filling defect. Bilateral urine flow jets demonstrated.  IMPRESSION:  Diffuse parenchymal atrophy in both kidneys. No focal mass lesion or hydronephrosis in either kidney. Normal appearance of the bladder.   Electronically Signed   By: Lucienne Capers M.D.   On: 02/12/2014 00:10    Medications: Scheduled Meds: . cefTRIAXone (ROCEPHIN)  IV  1 g Intravenous Q24H  . cloNIDine  0.1 mg Oral BID  . enoxaparin (LOVENOX) injection  30 mg Subcutaneous Q24H  . insulin aspart  0-9 Units Subcutaneous TID WC  . insulin glargine  30 Units Subcutaneous QHS  . propranolol  20 mg Oral BID  . sodium chloride  3 mL Intravenous Q12H      LOS: 1 day   Kemon Devincenzi Krystal Eaton M.D. [facility] 02/12/2014, 1:08 PM Pager: [phone]  If 7PM-7AM, please contact night-coverage [email or web address] Password [email or web address]  **Disclaimer: This note was dictated with voice recognition software. Similar sounding words can inadvertently be transcribed and this note may contain transcription errors which may not have been corrected upon publication of note.**

## 2014-02-13 LAB — CBC
HCT: 34.7 % — ABNORMAL LOW (ref 36.0–46.0)
Hemoglobin: 11.4 g/dL — ABNORMAL LOW (ref 12.0–15.0)
MCH: 28.4 pg (ref 26.0–34.0)
MCHC: 32.9 g/dL (ref 30.0–36.0)
MCV: 86.5 fL (ref 78.0–100.0)
Platelets: 184 10*3/uL (ref 150–400)
RBC: 4.01 MIL/uL (ref 3.87–5.11)
RDW: 13.6 % (ref 11.5–15.5)
WBC: 6.2 10*3/uL (ref 4.0–10.5)

## 2014-02-13 LAB — GLUCOSE, CAPILLARY
Glucose-Capillary: 212 mg/dL — ABNORMAL HIGH (ref 70–99)
Glucose-Capillary: 100 mg/dL — ABNORMAL HIGH (ref 70–99)

## 2014-02-13 LAB — BASIC METABOLIC PANEL
BUN: 41 mg/dL — ABNORMAL HIGH (ref 6–23)
CO2: 23 mEq/L (ref 19–32)
Calcium: 9.3 mg/dL (ref 8.4–10.5)
Chloride: 107 mEq/L (ref 96–112)
Creatinine, Ser: 1.34 mg/dL — ABNORMAL HIGH (ref 0.50–1.10)
GFR calc Af Amer: 42 mL/min — ABNORMAL LOW (ref >90)
GFR calc non Af Amer: 37 mL/min — ABNORMAL LOW (ref >90)
Glucose, Bld: 118 mg/dL — ABNORMAL HIGH (ref 70–99)
Potassium: 5.3 mEq/L (ref 3.7–5.3)
Sodium: 141 mEq/L (ref 137–147)

## 2014-02-13 LAB — CK: Total CK: 194 U/L — ABNORMAL HIGH (ref 7–177)

## 2014-02-13 MED ORDER — INSULIN GLARGINE 100 UNIT/ML ~~LOC~~ SOLN: 45.0000 [IU] | Freq: Every day | SUBCUTANEOUS | Status: DC

## 2014-02-13 MED ORDER — CEPHALEXIN 500 MG PO CAPS: 500.0000 mg | ORAL_CAPSULE | Freq: Two times a day (BID) | ORAL | Status: DC

## 2014-02-13 MED ORDER — CEPHALEXIN 500 MG PO CAPS
500.0000 mg | ORAL_CAPSULE | Freq: Two times a day (BID) | ORAL | Status: DC
  Administered 2014-02-13: 500 mg via ORAL
  Filled 2014-02-13 (×2): qty 1

## 2014-02-13 MED ORDER — ENOXAPARIN SODIUM 40 MG/0.4ML ~~LOC~~ SOLN: 40.0000 mg | SUBCUTANEOUS | Status: DC

## 2014-02-13 NOTE — Discharge Summary (Signed)
Physician Discharge Summary  Patient ID: [name] MRN: [id number] DOB/AGE: Jun 23, 1933 80 y.o.  Admit date: 02/11/2014 Discharge date: 02/13/2014  Primary Care Physician:  Milagros Evener, MD  Discharge Diagnoses:    . Acute renal failure improved  . Essential and other specified forms of tremor . HTN (hypertension) . Diabetes mellitus Urinary tract infection  Consults:  None   Recommendations for Outpatient Follow-up:  Please follow BMET at the time of followup appointment, several of the patient's medications are placed on hold (chlorthalidone, losartan, spironolactone, pravastatin)  Please check CK before starting pravastatin  Allergies:   Allergies  Allergen Reactions  . Codeine Nausea And Vomiting  . Dilaudid [Hydromorphone Hcl] Nausea And Vomiting and Other (See Comments)    Severe sweating  . Ciprofloxacin     Loss of balance.    . Mysoline [Primidone]     Nausea     Discharge Medications:   Medication List    STOP taking these medications       chlorthalidone 25 MG tablet  Commonly known as:  HYGROTON     ciprofloxacin 500 MG tablet  Commonly known as:  CIPRO     losartan 100 MG tablet  Commonly known as:  COZAAR     pravastatin 40 MG tablet  Commonly known as:  PRAVACHOL     spironolactone 50 MG tablet  Commonly known as:  ALDACTONE      TAKE these medications       BAYER CONTOUR NEXT TEST test strip  Generic drug:  glucose blood  1 each by Other route 3 (three) times daily.     cephALEXin 500 MG capsule  Commonly known as:  KEFLEX  Take 1 capsule (500 mg total) by mouth 2 (two) times daily. X 3 days     cloNIDine 0.1 MG tablet  Commonly known as:  CATAPRES  Take 0.1 mg by mouth 2 (two) times daily.     gabapentin 400 MG capsule  Commonly known as:  NEURONTIN  Take 400 mg by mouth 3 (three) times daily.     insulin glargine 100 UNIT/ML injection  Commonly known as:  LANTUS  Inject 0.45 mLs (45 Units total) into the skin at  bedtime.     metFORMIN 1000 MG tablet  Commonly known as:  GLUCOPHAGE  Take 1,000 mg by mouth 2 (two) times daily with a meal.     propranolol 10 MG tablet  Commonly known as:  INDERAL  Take 20 mg by mouth 2 (two) times daily. 1 tablet twice daily for 2 weeks, then take 2 tablets twice daily     TRADJENTA 5 MG Tabs tablet  Generic drug:  linagliptin  Take 5 mg by mouth daily.         Brief H and P: For complete details please refer to admission H and P, but in briefSarah R [name] is a 80 y.o. female with history of hypertension, diabetes mellitus, hyperlipidemia, chronic back pain had come to the ER 3 days ago because of urinary incontinence and nausea. At that time labs showed mildly elevated lipase and sonogram of the abdomen done showed gallbladder stones but no depressed signs of cholecystitis. Since patient had urinary incontinence MRI of the L-spine was done which did not show any signs of cord compression. Patient was discharged home on Cipro for UTI. Patient since started taking Cipro was getting imbalance and difficulty walk and did not take any Cipro yesterday and was feeling weak and had a fall. Did  not lose consciousness or have any chest pain shortness of breath or any diarrhea. Patient's nausea  had improved. Patient did not have any further episodes of urinary incontinence. As per the patient's daughter patient had not been making much urine last 2 days. In the ER patient was found to have creatinine around 4, 4 times from what it was 2 days ago. UA showed UTI, patient was placed on Rocephin and IV fluids, admitted for further management of acute renal failure.    Hospital Course:  Acute renal failure, baseline creatinine 1.0 in 2014, 1.4 on 6/3, patient presented with a creatinine fraction of 4.28, GFR of 10. Possibly underlying chronic kidney disease. Patient was admitted to telemetry. She was placed on aggressive IV fluid hydration and HCTZ, spironolactone, Cozaar were held.  Ciprofloxacin was discontinued.  Renal ultrasound was done, no hydronephrosis or mass lesion, diffuse parenchymal atrophy in both kidneys suggest possible underlying chronic kidney disease, likely due to diabetes mellitus, hypertensive nephropathy. Patient's creatinine function improved to 1.3 at the time of discharge.    Essential and other specified forms of tremor: Currently stable   Urinary tract infection  -Patient received 2 days of IV Rocephin, placed on oral 2 days of Keflex to complete a full course   HTN (hypertension)  - Currently stable , please note several of patient's antihypertensives have been held, please adjust or restart medications at the followup appointment  Diabetes mellitus -  continue Lantus, I have decreased it down to 45 units, sliding scale. The patient was admitted to followup with her PCP on endocrinologist at the followup appointment regarding the Lantus dosing      Day of Discharge BP 109/51  Pulse 63  Temp(Src) 97.3 F (36.3 C) (Oral)  Resp 20  Ht 5\' 9"  (1.753 m)  Wt 107.1 kg (236 lb 1.8 oz)  BMI 34.85 kg/m2  SpO2 98%  Physical Exam: General: Alert and awake oriented x3 not in any acute distress. CVS: S1-S2 clear no murmur rubs or gallops Chest: clear to auscultation bilaterally, no wheezing rales or rhonchi Abdomen: soft nontender, nondistended, normal bowel sounds Extremities: no cyanosis, clubbing or edema noted bilaterally Neuro: Cranial nerves II-XII intact, no focal neurological deficits   The results of significant diagnostics from this hospitalization (including imaging, microbiology, ancillary and laboratory) are listed below for reference.    LAB RESULTS: Basic Metabolic Panel:  Recent Labs Lab 02/12/14 0418 02/13/14 0440  NA 141 141  K 5.0 5.3  CL 108 107  CO2 22 23  GLUCOSE 163* 118*  BUN 65* 41*  CREATININE 2.90* 1.34*  CALCIUM 8.7 9.3   Liver Function Tests:  Recent Labs Lab 02/11/14 1923 02/12/14 0418  AST  19 17  ALT 18 14  ALKPHOS 55 50  BILITOT 0.4 0.4  PROT 6.6 5.8*  ALBUMIN 3.2* 2.9*    Recent Labs Lab 02/07/14 1705 02/12/14 0418  LIPASE 60* 71*   No results found for this basename: AMMONIA,  in the last 168 hours CBC:  Recent Labs Lab 02/11/14 1923 02/13/14 0440  WBC 10.6* 6.2  NEUTROABS 7.6  --   HGB 11.5* 11.4*  HCT 35.0* 34.7*  MCV 87.3 86.5  PLT 170 184   Cardiac Enzymes:  Recent Labs Lab 02/11/14 1923 02/12/14 0418 02/13/14 0440  CKTOTAL  --  285* 194*  TROPONINI <0.30  --   --    BNP: No components found with this basename: POCBNP,  CBG:  Recent Labs Lab 02/12/14 2043 02/13/14 3295  GLUCAP 212* 100*    Significant Diagnostic Studies:  Dg Sacrum/coccyx  02/11/2014   CLINICAL DATA:  Status post fall.  Tailbone pain.  EXAM: SACRUM AND COCCYX - 2+ VIEW  COMPARISON:  None.  FINDINGS: No acute bony or joint abnormality is identified. Degenerative disc disease lower lumbar spine is seen.  IMPRESSION: No acute finding.   Electronically Signed   By: Inge Rise M.D.   On: 02/11/2014 19:48   Ct Head Wo Contrast  02/11/2014   CLINICAL DATA:  Status post fall.  Blow to the back of the head.  EXAM: CT HEAD WITHOUT CONTRAST  TECHNIQUE: Contiguous axial images were obtained from the base of the skull through the vertex without intravenous contrast.  COMPARISON:  Head CT scan 09/11/2013.  FINDINGS: The brain appears normal without infarct, hemorrhage, mass lesion, mass effect, midline shift or abnormal extra-axial fluid collection. No hydrocephalus or pneumocephalus. The calvarium is intact. Imaged paranasal sinuses and mastoid air cells are clear.  IMPRESSION: Negative head CT.   Electronically Signed   By: Inge Rise M.D.   On: 02/11/2014 20:11   US Renal  02/12/2014   CLINICAL DATA:  Fall.  Urinary tract infection.  EXAM: RENAL/URINARY TRACT ULTRASOUND COMPLETE  COMPARISON:  Ultrasound abdomen 02/07/2014.  FINDINGS: Right Kidney:  Length: 10.3 cm. Diffuse  parenchymal thinning. No focal mass lesion or hydronephrosis.  Left Kidney:  Length: 11.7 cm. Diffuse parenchymal thinning. No focal mass lesion or hydronephrosis.  Bladder:  Incomplete distention. No bladder wall thickening or filling defect. Bilateral urine flow jets demonstrated.  IMPRESSION: Diffuse parenchymal atrophy in both kidneys. No focal mass lesion or hydronephrosis in either kidney. Normal appearance of the bladder.   Electronically Signed   By: Lucienne Capers M.D.   On: 02/12/2014 00:10      Disposition and Follow-up:     Discharge Instructions   Diet Carb Modified    Complete by:  As directed      Discharge instructions    Complete by:  As directed   It is VERY IMPORTANT that you follow up with a PCP on a regular basis.  Check your blood glucoses before each meal and at bedtime and maintain a log of your readings.  Bring this log with you when you follow up with your PCP so that he or she can adjust your insulin at your follow up visit.   Please note all the medication changes before you start all your home medications. Please HOLD Spironolactone, cozaar, chlorthalidone, statin for now until you follow up with Dr Radene Ou.     Increase activity slowly    Complete by:  As directed             DISPOSITION: Home  DIET: Carb modified diet   DISCHARGE FOLLOW-UP Follow-up Information   Follow up with Milagros Evener, MD. Schedule an appointment as soon as possible for a visit in 10 days. (for hospital follow-up)    Specialty:  Family Medicine   Contact information:   [place]. [place] [address] [phone]       Time spent on Discharge: 45  mins  Signed:   Mendel Corning M.D. [facility] 02/13/2014, 9:53 AM Pager: [phone]   **Disclaimer: This note was dictated with voice recognition software. Similar sounding words can inadvertently be transcribed and this note may contain transcription errors which may not have been corrected upon  publication of note.**

## 2014-02-13 NOTE — Evaluation (Signed)
Physical Therapy One Time Evaluation Patient Details Name: [name] MRN: [id number] DOB: May 18, 1933 Today's Date: 02/13/2014   History of Present Illness  80 y.o. female with history of hypertension, diabetes mellitus, hyperlipidemia, chronic back pain and admitted 02/11/14 for acute renal failure.  Pt reports taking Cipro recently which she states was cause of her fall prior to admission.  Pt reports seeing neurosurgeon for chronic back pain and sciatica with she states she is to return in a couple weeks for follow up.  Clinical Impression  Patient evaluated by Physical Therapy with no further acute PT needs identified. All education has been completed and the patient has no further questions. Pt reports chronic back pain and L LE sciatica for which she is seeing neurosurgeon, discussed upon f/u with MD in a couple weeks to inquire about PT for sciatica if no plans for back surgery.  Pt reports fall at home prior to admission due to taking Cipro and reports new dx of essential tremors so discussed using RW vs rollator for safety and assist with balance. No immediate follow-up Physial Therapy or equipment needs. PT is signing off. Thank you for this referral.     Follow Up Recommendations Outpatient PT    Equipment Recommendations  None recommended by PT    Recommendations for Other Services       Precautions / Restrictions Precautions Precautions: None      Mobility  Bed Mobility               General bed mobility comments: pt up in recliner on arrival  Transfers Overall transfer level: Needs assistance Equipment used: Rolling walker (2 wheeled) Transfers: Sit to/from Stand Sit to Stand: Modified independent (Device/Increase time)            Ambulation/Gait Ambulation/Gait assistance: Supervision;Modified independent (Device/Increase time) Ambulation Distance (Feet): 400 Feet Assistive device: Rolling walker (2 wheeled) Gait Pattern/deviations: Step-through  pattern;Trunk flexed     General Gait Details: verbal cues for use of RW, discussed rollator vs RW for home use and recommended RW at this time due to essential tremors and pt feeling "off-balance"  Stairs            Wheelchair Mobility    Modified Rankin (Stroke Patients Only)       Balance                                             Pertinent Vitals/Pain No reports of pain    Home Living Family/patient expects to be discharged to:: Private residence Living Arrangements: Alone   Type of Home: House Home Access: Level entry     Home Layout: One level Home Equipment: Environmental consultant - 2 wheels;Walker - 4 wheels      Prior Function Level of Independence: Independent with assistive device(s)         Comments: previously independent however after feeling off balance since starting Cipro, pt reports using rollator     Hand Dominance        Extremity/Trunk Assessment               Lower Extremity Assessment: Overall WFL for tasks assessed (reports L sciatica however no pain today)         Communication   Communication: No difficulties  Cognition Arousal/Alertness: Awake/alert Behavior During Therapy: WFL for tasks assessed/performed Overall Cognitive Status: Within Functional Limits for  tasks assessed                      General Comments      Exercises        Assessment/Plan    PT Assessment All further PT needs can be met in the next venue of care  PT Diagnosis     PT Problem List Pain  PT Treatment Interventions     PT Goals (Current goals can be found in the Care Plan section) Acute Rehab PT Goals PT Goal Formulation: No goals set, d/c therapy    Frequency     Barriers to discharge        Co-evaluation               End of Session   Activity Tolerance: Patient tolerated treatment well Patient left: in chair;with call bell/phone within reach Nurse Communication: Mobility status          Time: 4799-8721 PT Time Calculation (min): 11 min   Charges:   PT Evaluation $Initial PT Evaluation Tier I: 1 Procedure PT Treatments $Gait Training: 8-22 mins   PT G CodesJunius Argyle 02/13/2014, 1:14 PM Carmelia Bake, PT, DPT 02/13/2014 Pager: [phone]

## 2014-02-21 ENCOUNTER — Ambulatory Visit: Payer: Medicare Other | Admitting: Nurse Practitioner

## 2014-03-01 ENCOUNTER — Other Ambulatory Visit: Payer: Self-pay | Admitting: Neurosurgery

## 2014-03-01 DIAGNOSIS — M5136 Other intervertebral disc degeneration, lumbar region: Secondary | ICD-10-CM

## 2014-03-06 ENCOUNTER — Ambulatory Visit
Admission: RE | Admit: 2014-03-06 | Discharge: 2014-03-06 | Disposition: A | Discharge status: Home / Self Care | Payer: Medicare Other | Source: Ambulatory Visit | Attending: Neurosurgery | Admitting: Neurosurgery

## 2014-03-06 VITALS — BP 123/63 | HR 69

## 2014-03-06 DIAGNOSIS — M5136 Other intervertebral disc degeneration, lumbar region: Secondary | ICD-10-CM

## 2014-03-06 DIAGNOSIS — M51369 Other intervertebral disc degeneration, lumbar region without mention of lumbar back pain or lower extremity pain: Secondary | ICD-10-CM

## 2014-03-06 MED ORDER — IOHEXOL 180 MG/ML  SOLN
15.0000 mL | Freq: Once | INTRAMUSCULAR | Status: AC | PRN
  Administered 2014-03-06: 15 mL via INTRATHECAL

## 2014-03-06 MED ORDER — DIAZEPAM 5 MG PO TABS
5.0000 mg | ORAL_TABLET | Freq: Once | ORAL | Status: AC
  Administered 2014-03-06: 5 mg via ORAL

## 2014-03-06 NOTE — Discharge Instructions (Signed)
Myelogram Discharge Instructions  1. Go home and rest quietly for the next 24 hours.  It is important to lie flat for the next 24 hours.  Get up only to go to the restroom.  You may lie in the bed or on a couch on your back, your stomach, your left side or your right side.  You may have one pillow under your head.  You may have pillows between your knees while you are on your side or under your knees while you are on your back.  2. DO NOT drive today.  Recline the seat as far back as it will go, while still wearing your seat belt, on the way home.  3. You may get up to go to the bathroom as needed.  You may sit up for 10 minutes to eat.  You may resume your normal diet and medications unless otherwise indicated.  Drink lots of extra fluids today and tomorrow.  4. The incidence of headache, nausea, or vomiting is about 5% (one in 20 patients).  If you develop a headache, lie flat and drink plenty of fluids until the headache goes away.  Caffeinated beverages may be helpful.  If you develop severe nausea and vomiting or a headache that does not go away with flat bed rest, call [phone].  5. You may resume normal activities after your 24 hours of bed rest is over; however, do not exert yourself strongly or do any heavy lifting tomorrow. If when you get up you have a headache when standing, go back to bed and force fluids for another 24 hours.  6. Call your physician for a follow-up appointment.  The results of your myelogram will be sent directly to your physician by the following day.  7. If you have any questions or if complications develop after you arrive home, please call [phone].  Discharge instructions have been explained to the patient.  The patient, or the person responsible for the patient, fully understands these instructions.      May resume Tramadol on March 07, 2014, after 11:00 am.

## 2014-03-06 NOTE — Progress Notes (Signed)
Pt states she has been off Tramadol for the past 2 days. Discharge instructions explained to pt and daughter.

## 2014-03-12 ENCOUNTER — Other Ambulatory Visit: Payer: Self-pay | Admitting: Neurosurgery

## 2014-03-13 ENCOUNTER — Encounter (HOSPITAL_COMMUNITY): Payer: Self-pay | Admitting: Pharmacy Technician

## 2014-03-14 ENCOUNTER — Encounter (HOSPITAL_COMMUNITY): Payer: Self-pay

## 2014-03-14 ENCOUNTER — Encounter (HOSPITAL_COMMUNITY)
Admission: RE | Admit: 2014-03-14 | Discharge: 2014-03-14 | Disposition: A | Discharge status: Home / Self Care | Payer: Medicare Other | Source: Ambulatory Visit | Attending: Anesthesiology | Admitting: Anesthesiology

## 2014-03-14 ENCOUNTER — Encounter (HOSPITAL_COMMUNITY)
Admission: RE | Admit: 2014-03-14 | Discharge: 2014-03-14 | Disposition: A | Discharge status: Home / Self Care | Payer: Medicare Other | Source: Ambulatory Visit | Attending: Neurosurgery | Admitting: Neurosurgery

## 2014-03-14 HISTORY — DX: Personal history of urinary (tract) infections: Z87.440

## 2014-03-14 HISTORY — DX: Other complications of anesthesia, initial encounter: T88.59XA

## 2014-03-14 HISTORY — DX: Other specified postprocedural states: R11.2

## 2014-03-14 HISTORY — DX: Other specified postprocedural states: Z98.890

## 2014-03-14 HISTORY — DX: Adverse effect of unspecified anesthetic, initial encounter: T41.45XA

## 2014-03-14 HISTORY — DX: Other intervertebral disc degeneration, lumbar region without mention of lumbar back pain or lower extremity pain: M51.369

## 2014-03-14 HISTORY — DX: Other intervertebral disc degeneration, lumbar region: M51.36

## 2014-03-14 HISTORY — DX: Personal history of other diseases of urinary system: Z87.448

## 2014-03-14 LAB — BASIC METABOLIC PANEL
Anion gap: 17 — ABNORMAL HIGH (ref 5–15)
BUN: 21 mg/dL (ref 6–23)
CO2: 23 mEq/L (ref 19–32)
Calcium: 9.4 mg/dL (ref 8.4–10.5)
Chloride: 101 mEq/L (ref 96–112)
Creatinine, Ser: 0.91 mg/dL (ref 0.50–1.10)
GFR calc Af Amer: 68 mL/min — ABNORMAL LOW (ref >90)
GFR calc non Af Amer: 58 mL/min — ABNORMAL LOW (ref >90)
Glucose, Bld: 181 mg/dL — ABNORMAL HIGH (ref 70–99)
Potassium: 4 mEq/L (ref 3.7–5.3)
Sodium: 141 mEq/L (ref 137–147)

## 2014-03-14 LAB — CBC
HCT: 35.3 % — ABNORMAL LOW (ref 36.0–46.0)
Hemoglobin: 11.6 g/dL — ABNORMAL LOW (ref 12.0–15.0)
MCH: 28.7 pg (ref 26.0–34.0)
MCHC: 32.9 g/dL (ref 30.0–36.0)
MCV: 87.4 fL (ref 78.0–100.0)
Platelets: 224 10*3/uL (ref 150–400)
RBC: 4.04 MIL/uL (ref 3.87–5.11)
RDW: 13.5 % (ref 11.5–15.5)
WBC: 7.2 10*3/uL (ref 4.0–10.5)

## 2014-03-14 LAB — SURGICAL PCR SCREEN
MRSA, PCR: NEGATIVE
Staphylococcus aureus: NEGATIVE

## 2014-03-14 NOTE — Pre-Procedure Instructions (Addendum)
[name]  03/14/2014   Your procedure is scheduled on:  Friday, July 10  Report to [facility] Admitting at 0730 AM.  Call this number if you have problems the morning of surgery: [phone]   Remember:   Do not eat food or drink liquids after midnight.Thursday night   Take these medicines the morning of surgery with A SIP OF WATER: Clonidine,Propanolol,gabapentin, Oxycodone if needed   Do not wear jewelry, make-up or nail polish.  Do not wear lotions, powders, or perfumes. You may wear deodorant.  Do not shave 48 hours prior to surgery. Men may shave face and neck.  Do not bring valuables to the hospital.  [facility] is not responsible   for any belongings or valuables.               Contacts, dentures or bridgework may not be worn into surgery.  Leave suitcase in the car. After surgery it may be brought to your room.  For patients admitted to the hospital, discharge time is determined by your     treatment team.               Patients discharged the day of surgery will not be allowed to drive  home.  Name and phone number of your driver:    Special Instructions: [place] - Preparing for Surgery  Before surgery, you can play an important role.  Because skin is not sterile, your skin needs to be as free of germs as possible.  You can reduce the number of germs on you skin by washing with CHG (chlorahexidine gluconate) soap before surgery.  CHG is an antiseptic cleaner which kills germs and bonds with the skin to continue killing germs even after washing.  Please DO NOT use if you have an allergy to CHG or antibacterial soaps.  If your skin becomes reddened/irritated stop using the CHG and inform your nurse when you arrive at Short Stay.  Do not shave (including legs and underarms) for at least 48 hours prior to the first CHG shower.  You may shave your face.  Please follow these instructions carefully:   1.  Shower with CHG Soap the night before surgery and the   morning of Surgery.  2.  If you choose to wash your hair, wash your hair first as usual with your normal shampoo.  3.  After you shampoo, rinse your hair and body thoroughly to remove the   Shampoo.  4.  Use CHG as you would any other liquid soap.  You can apply chg directly  to the skin and wash gently with scrungie or a clean washcloth.  5.  Apply the CHG Soap to your body ONLY FROM THE NECK DOWN.    Do not use on open wounds or open sores.  Avoid contact with your eyes,   ears, mouth and genitals (private parts).  Wash genitals (private parts)  with your normal soap.  6.  Wash thoroughly, paying special attention to the area where your surgery   will be performed.  7.  Thoroughly rinse your body with warm water from the neck down.  8.  DO NOT shower/wash with your normal soap after using and rinsing off   the CHG Soap.  9.  Pat yourself dry with a clean towel.            10.  Wear clean pajamas.            11.  Place clean sheets on your bed the night of your first shower and do not  sleep with pets.  Day of Surgery  Do not apply any lotions/deoderants the morning of surgery.  Please wear clean clothes to the hospital/surgery center.

## 2014-03-15 MED ORDER — CEFAZOLIN SODIUM-DEXTROSE 2-3 GM-% IV SOLR
2.0000 g | INTRAVENOUS | Status: AC
  Administered 2014-03-16: 2 g via INTRAVENOUS
  Filled 2014-03-15: qty 50

## 2014-03-15 NOTE — Progress Notes (Signed)
Pt notified of time change and instructed her to be here at 7:15 AM. Pt voiced understanding.

## 2014-03-15 NOTE — H&P (Signed)
[name] is an 80 y.o. female.   Chief Complaint: left leg pain [id number] complaining of lumbar pain with radiation to the left leg up to the knee for at least 4 months. No right leg pain  Past Medical History  Diagnosis Date  . Hypertension   . Hyperlipemia   . Arthritis   . Cancer     skin  . Staph infection     1970's  . Essential and other specified forms of tremor 05/03/2013  . Cholelithiasis   . Chronic insomnia   . Chronic low back pain   . Obesity   . Peripheral edema   . Wrist fracture     left October 2014  . Complication of anesthesia   . PONV (postoperative nausea and vomiting)   . Hx of acute renal failure 02/2014    admitted to [facility]  . DDD (degenerative disc disease), lumbar   . Hx: UTI (urinary tract infection) 02/2014  . Diabetes mellitus     Iddm x 12 years  . Diabetic peripheral neuropathy     Past Surgical History  Procedure Laterality Date  . Abdominal hysterectomy    . Hernia repair    . Total knee arthroplasty    . Cataract extraction    . Oophorectomy    . Neck surgery    . Cardiac catheterization  2000    Dr Einar Gip  . Back surgery  2005  . Eye surgery    . Colonoscopy  >10 years    Family History  Problem Relation Age of Onset  . Cancer Brother     prostate  . Heart disease Brother   . Alzheimer's disease Brother   . Stroke Mother   . Hypertension Mother   . COPD Brother   . Cancer Brother     Prostate cancer  . Hypertension Sister     Congestive heart failure   Social History:  reports that she has never smoked. She has never used smokeless tobacco. She reports that she does not drink alcohol or use illicit drugs.  Allergies:  Allergies  Allergen Reactions  . Codeine Nausea And Vomiting  . Dilaudid [Hydromorphone Hcl] Nausea And Vomiting and Other (See Comments)    Severe sweating  . Ciprofloxacin     Acute kidney failure  . Mysoline [Primidone]     Nausea    No prescriptions prior to admission     Results for orders placed during the hospital encounter of 03/14/14 (from the past 48 hour(s))  SURGICAL PCR SCREEN     Status: None   Collection Time    03/14/14 11:36 AM      Result Value Ref Range   MRSA, PCR NEGATIVE  NEGATIVE   Staphylococcus aureus NEGATIVE  NEGATIVE   Comment:            The Xpert SA Assay (FDA     approved for NASAL specimens     in patients over 76 years of age),     is one component of     a comprehensive surveillance     program.  Test performance has     been validated by [employer] for patients greater     than or equal to 108 year old.     It is not intended     to diagnose infection nor to     guide or monitor treatment.  BASIC METABOLIC PANEL     Status: Abnormal  Collection Time    03/14/14 11:40 AM      Result Value Ref Range   Sodium 141  137 - 147 mEq/L   Potassium 4.0  3.7 - 5.3 mEq/L   Chloride 101  96 - 112 mEq/L   CO2 23  19 - 32 mEq/L   Glucose, Bld 181 (*) 70 - 99 mg/dL   BUN 21  6 - 23 mg/dL   Creatinine, Ser 0.91  0.50 - 1.10 mg/dL   Calcium 9.4  8.4 - 10.5 mg/dL   GFR calc non Af Amer 58 (*) >90 mL/min   GFR calc Af Amer 68 (*) >90 mL/min   Comment: (NOTE)     The eGFR has been calculated using the CKD EPI equation.     This calculation has not been validated in all clinical situations.     eGFR's persistently <90 mL/min signify possible Chronic Kidney     Disease.   Anion gap 17 (*) 5 - 15  CBC     Status: Abnormal   Collection Time    03/14/14 11:40 AM      Result Value Ref Range   WBC 7.2  4.0 - 10.5 K/uL   RBC 4.04  3.87 - 5.11 MIL/uL   Hemoglobin 11.6 (*) 12.0 - 15.0 g/dL   HCT 35.3 (*) 36.0 - 46.0 %   MCV 87.4  78.0 - 100.0 fL   MCH 28.7  26.0 - 34.0 pg   MCHC 32.9  30.0 - 36.0 g/dL   RDW 13.5  11.5 - 15.5 %   Platelets 224  150 - 400 K/uL   Dg Chest 2 View  03/14/2014   CLINICAL DATA:  Hypertension.  EXAM: CHEST  2 VIEW  COMPARISON:  April 26, 2011.  FINDINGS: The heart size and mediastinal contours  are within normal limits. Both lungs are clear. The visualized skeletal structures are unremarkable.  IMPRESSION: No acute cardiopulmonary abnormality seen.   Electronically Signed   By: Sabino Dick M.D.   On: 03/14/2014 13:55    Review of Systems  Constitutional: Negative.   HENT: Negative.   Eyes: Negative.   Respiratory: Negative.   Cardiovascular: Negative.   Gastrointestinal: Negative.   Genitourinary: Negative.   Musculoskeletal: Positive for back pain.  Skin: Negative.   Neurological: Positive for focal weakness.  Endo/Heme/Allergies: Negative.   Psychiatric/Behavioral: Negative.     There were no vitals taken for this visit. Physical Exam hent, nl. Neck scar from previous surgery. Cv, nl. Lungs, clear. Abdomen, soft. Extremities, nl. .neurofemoral strecth maneuver is positive bilaterally. SLR positive at 80 degrees, mri showa l3-4 stenosis secondary of hypertrophy of the facets.   Assessment/Plan Patient to go ahead with LEFT laminotomies and foraminotomy. She is aware of the risks and benefits  [name] M 03/15/2014, 4:57 PM

## 2014-03-16 ENCOUNTER — Encounter (HOSPITAL_COMMUNITY)
Admission: RE | Disposition: A | Discharge status: Home Health | Payer: Self-pay | Source: Ambulatory Visit | Attending: Neurosurgery

## 2014-03-16 ENCOUNTER — Ambulatory Visit (HOSPITAL_COMMUNITY): Payer: Medicare Other | Admitting: Anesthesiology

## 2014-03-16 ENCOUNTER — Encounter (HOSPITAL_COMMUNITY): Payer: Medicare Other | Admitting: Anesthesiology

## 2014-03-16 ENCOUNTER — Inpatient Hospital Stay (HOSPITAL_COMMUNITY): Payer: Medicare Other

## 2014-03-16 ENCOUNTER — Encounter (HOSPITAL_COMMUNITY): Payer: Self-pay | Admitting: Surgery

## 2014-03-16 ENCOUNTER — Inpatient Hospital Stay (HOSPITAL_COMMUNITY)
Admission: RE | Admit: 2014-03-16 | Discharge: 2014-03-17 | DRG: 520 | Disposition: A | Discharge status: Home Health | Payer: Medicare Other | Source: Ambulatory Visit | Attending: Neurosurgery | Admitting: Neurosurgery

## 2014-03-16 DIAGNOSIS — Z794 Long term (current) use of insulin: Secondary | ICD-10-CM | POA: Diagnosis not present

## 2014-03-16 DIAGNOSIS — Z01812 Encounter for preprocedural laboratory examination: Secondary | ICD-10-CM

## 2014-03-16 DIAGNOSIS — I10 Essential (primary) hypertension: Secondary | ICD-10-CM | POA: Diagnosis not present

## 2014-03-16 DIAGNOSIS — Z79899 Other long term (current) drug therapy: Secondary | ICD-10-CM | POA: Diagnosis not present

## 2014-03-16 DIAGNOSIS — E119 Type 2 diabetes mellitus without complications: Secondary | ICD-10-CM | POA: Diagnosis not present

## 2014-03-16 DIAGNOSIS — M48061 Spinal stenosis, lumbar region without neurogenic claudication: Principal | ICD-10-CM | POA: Diagnosis present

## 2014-03-16 HISTORY — PX: LUMBAR LAMINECTOMY/DECOMPRESSION MICRODISCECTOMY: SHX5026

## 2014-03-16 LAB — GLUCOSE, CAPILLARY
Glucose-Capillary: 190 mg/dL — ABNORMAL HIGH (ref 70–99)
Glucose-Capillary: 161 mg/dL — ABNORMAL HIGH (ref 70–99)
Glucose-Capillary: 135 mg/dL — ABNORMAL HIGH (ref 70–99)
Glucose-Capillary: 150 mg/dL — ABNORMAL HIGH (ref 70–99)
Glucose-Capillary: 163 mg/dL — ABNORMAL HIGH (ref 70–99)

## 2014-03-16 SURGERY — LUMBAR LAMINECTOMY/DECOMPRESSION MICRODISCECTOMY 1 LEVEL
Anesthesia: General | Laterality: Left

## 2014-03-16 MED ORDER — INSULIN ASPART 100 UNIT/ML ~~LOC~~ SOLN
0.0000 [IU] | Freq: Three times a day (TID) | SUBCUTANEOUS | Status: DC
  Administered 2014-03-16: 3 [IU] via SUBCUTANEOUS

## 2014-03-16 MED ORDER — MENTHOL 3 MG MT LOZG: 1.0000 | LOZENGE | OROMUCOSAL | Status: DC | PRN

## 2014-03-16 MED ORDER — INSULIN ASPART 100 UNIT/ML ~~LOC~~ SOLN: 0.0000 [IU] | Freq: Every day | SUBCUTANEOUS | Status: DC

## 2014-03-16 MED ORDER — ROCURONIUM BROMIDE 100 MG/10ML IV SOLN
INTRAVENOUS | Status: DC | PRN
  Administered 2014-03-16: 50 mg via INTRAVENOUS

## 2014-03-16 MED ORDER — LIDOCAINE HCL (CARDIAC) 20 MG/ML IV SOLN
INTRAVENOUS | Status: AC
  Filled 2014-03-16: qty 5

## 2014-03-16 MED ORDER — VANCOMYCIN HCL 1000 MG IV SOLR
INTRAVENOUS | Status: DC | PRN
  Administered 2014-03-16: 1000 mg

## 2014-03-16 MED ORDER — LINAGLIPTIN 5 MG PO TABS
5.0000 mg | ORAL_TABLET | Freq: Every day | ORAL | Status: DC
  Administered 2014-03-16: 5 mg via ORAL
  Filled 2014-03-16 (×2): qty 1

## 2014-03-16 MED ORDER — MORPHINE SULFATE 2 MG/ML IJ SOLN: 1.0000 mg | INTRAMUSCULAR | Status: DC | PRN

## 2014-03-16 MED ORDER — ONDANSETRON HCL 4 MG/2ML IJ SOLN
INTRAMUSCULAR | Status: AC
  Filled 2014-03-16: qty 2

## 2014-03-16 MED ORDER — SIMVASTATIN 40 MG PO TABS
40.0000 mg | ORAL_TABLET | Freq: Every day | ORAL | Status: DC
  Administered 2014-03-16: 40 mg via ORAL
  Filled 2014-03-16 (×2): qty 1

## 2014-03-16 MED ORDER — BUPIVACAINE LIPOSOME 1.3 % IJ SUSP
20.0000 mL | INTRAMUSCULAR | Status: DC
  Filled 2014-03-16: qty 20

## 2014-03-16 MED ORDER — SODIUM CHLORIDE 0.9 % IV SOLN: INTRAVENOUS | Status: DC

## 2014-03-16 MED ORDER — PROPOFOL 10 MG/ML IV BOLUS
INTRAVENOUS | Status: AC
  Filled 2014-03-16: qty 20

## 2014-03-16 MED ORDER — HEMOSTATIC AGENTS (NO CHARGE) OPTIME
TOPICAL | Status: DC | PRN
  Administered 2014-03-16: 1 via TOPICAL

## 2014-03-16 MED ORDER — CHLORTHALIDONE 25 MG PO TABS
25.0000 mg | ORAL_TABLET | Freq: Every day | ORAL | Status: DC
  Administered 2014-03-16: 25 mg via ORAL
  Filled 2014-03-16 (×2): qty 1

## 2014-03-16 MED ORDER — PHENYLEPHRINE HCL 10 MG/ML IJ SOLN
INTRAMUSCULAR | Status: DC | PRN
  Administered 2014-03-16: 40 ug via INTRAVENOUS

## 2014-03-16 MED ORDER — SCOPOLAMINE 1 MG/3DAYS TD PT72
MEDICATED_PATCH | TRANSDERMAL | Status: DC | PRN
  Administered 2014-03-16: 1 via TRANSDERMAL

## 2014-03-16 MED ORDER — SODIUM CHLORIDE 0.9 % IV SOLN: 250.0000 mL | INTRAVENOUS | Status: DC

## 2014-03-16 MED ORDER — SODIUM CHLORIDE 0.9 % IJ SOLN
3.0000 mL | Freq: Two times a day (BID) | INTRAMUSCULAR | Status: DC
  Administered 2014-03-16: 3 mL via INTRAVENOUS

## 2014-03-16 MED ORDER — PHENYLEPHRINE HCL 10 MG/ML IJ SOLN
10.0000 mg | INTRAVENOUS | Status: DC | PRN
  Administered 2014-03-16: 10 ug/min via INTRAVENOUS

## 2014-03-16 MED ORDER — ZOLPIDEM TARTRATE 5 MG PO TABS: 5.0000 mg | ORAL_TABLET | Freq: Every evening | ORAL | Status: DC | PRN

## 2014-03-16 MED ORDER — PROPRANOLOL HCL 20 MG PO TABS
20.0000 mg | ORAL_TABLET | Freq: Two times a day (BID) | ORAL | Status: DC
  Administered 2014-03-16: 20 mg via ORAL
  Filled 2014-03-16 (×3): qty 1

## 2014-03-16 MED ORDER — ALBUMIN HUMAN 5 % IV SOLN
INTRAVENOUS | Status: DC | PRN
  Administered 2014-03-16: 12:00:00 via INTRAVENOUS

## 2014-03-16 MED ORDER — ACETAMINOPHEN 650 MG RE SUPP: 650.0000 mg | RECTAL | Status: DC | PRN

## 2014-03-16 MED ORDER — FENTANYL CITRATE 0.05 MG/ML IJ SOLN: 25.0000 ug | INTRAMUSCULAR | Status: DC | PRN

## 2014-03-16 MED ORDER — BUPIVACAINE LIPOSOME 1.3 % IJ SUSP
INTRAMUSCULAR | Status: DC | PRN
  Administered 2014-03-16: 20 mL

## 2014-03-16 MED ORDER — PROMETHAZINE HCL 25 MG/ML IJ SOLN
6.2500 mg | Freq: Four times a day (QID) | INTRAMUSCULAR | Status: DC | PRN
  Administered 2014-03-16: 6.25 mg via INTRAVENOUS
  Filled 2014-03-16: qty 1

## 2014-03-16 MED ORDER — EPHEDRINE SULFATE 50 MG/ML IJ SOLN
INTRAMUSCULAR | Status: DC | PRN
  Administered 2014-03-16: 10 mg via INTRAVENOUS
  Administered 2014-03-16: 5 mg via INTRAVENOUS

## 2014-03-16 MED ORDER — METFORMIN HCL 500 MG PO TABS
1000.0000 mg | ORAL_TABLET | Freq: Every day | ORAL | Status: DC
  Administered 2014-03-17: 1000 mg via ORAL
  Filled 2014-03-16 (×2): qty 2

## 2014-03-16 MED ORDER — ACETAMINOPHEN 325 MG PO TABS: 650.0000 mg | ORAL_TABLET | ORAL | Status: DC | PRN

## 2014-03-16 MED ORDER — ONDANSETRON HCL 4 MG/2ML IJ SOLN
INTRAMUSCULAR | Status: AC
Start: 2014-03-16 — End: 2014-03-17
  Filled 2014-03-16: qty 2

## 2014-03-16 MED ORDER — CLONIDINE HCL 0.1 MG PO TABS
0.1000 mg | ORAL_TABLET | Freq: Two times a day (BID) | ORAL | Status: DC
  Administered 2014-03-16: 0.1 mg via ORAL
  Filled 2014-03-16 (×3): qty 1

## 2014-03-16 MED ORDER — ONDANSETRON HCL 4 MG/2ML IJ SOLN
INTRAMUSCULAR | Status: DC | PRN
  Administered 2014-03-16: 4 mg via INTRAVENOUS

## 2014-03-16 MED ORDER — SODIUM CHLORIDE 0.9 % IJ SOLN: 3.0000 mL | INTRAMUSCULAR | Status: DC | PRN

## 2014-03-16 MED ORDER — ROCURONIUM BROMIDE 50 MG/5ML IV SOLN
INTRAVENOUS | Status: AC
  Filled 2014-03-16: qty 1

## 2014-03-16 MED ORDER — MIDAZOLAM HCL 5 MG/5ML IJ SOLN: INTRAMUSCULAR | Status: DC | PRN

## 2014-03-16 MED ORDER — THROMBIN 5000 UNITS EX SOLR
CUTANEOUS | Status: DC | PRN
  Administered 2014-03-16 (×2): 5000 [IU] via TOPICAL

## 2014-03-16 MED ORDER — SODIUM CHLORIDE 0.9 % IR SOLN: Status: DC

## 2014-03-16 MED ORDER — FENTANYL CITRATE 0.05 MG/ML IJ SOLN
INTRAMUSCULAR | Status: AC
  Filled 2014-03-16: qty 5

## 2014-03-16 MED ORDER — LIDOCAINE HCL (CARDIAC) 20 MG/ML IV SOLN
INTRAVENOUS | Status: DC | PRN
  Administered 2014-03-16: 80 mg via INTRAVENOUS

## 2014-03-16 MED ORDER — ONDANSETRON HCL 4 MG/2ML IJ SOLN
4.0000 mg | INTRAMUSCULAR | Status: DC | PRN
  Administered 2014-03-16: 4 mg via INTRAVENOUS

## 2014-03-16 MED ORDER — 0.9 % SODIUM CHLORIDE (POUR BTL) OPTIME
TOPICAL | Status: DC | PRN
  Administered 2014-03-16: 1000 mL

## 2014-03-16 MED ORDER — PHENOL 1.4 % MT LIQD: 1.0000 | OROMUCOSAL | Status: DC | PRN

## 2014-03-16 MED ORDER — LACTATED RINGERS IV SOLN
INTRAVENOUS | Status: DC
  Administered 2014-03-16: 08:00:00 via INTRAVENOUS

## 2014-03-16 MED ORDER — GABAPENTIN 300 MG PO CAPS
300.0000 mg | ORAL_CAPSULE | Freq: Three times a day (TID) | ORAL | Status: DC
  Administered 2014-03-16 (×2): 300 mg via ORAL
  Filled 2014-03-16 (×5): qty 1

## 2014-03-16 MED ORDER — LACTATED RINGERS IV SOLN
INTRAVENOUS | Status: DC | PRN
  Administered 2014-03-16 (×2): via INTRAVENOUS

## 2014-03-16 MED ORDER — PROPOFOL 10 MG/ML IV BOLUS
INTRAVENOUS | Status: DC | PRN
  Administered 2014-03-16: 125 mg via INTRAVENOUS
  Administered 2014-03-16: 75 mg via INTRAVENOUS

## 2014-03-16 MED ORDER — VANCOMYCIN HCL 1000 MG IV SOLR
1000.0000 mg | INTRAVENOUS | Status: DC
  Filled 2014-03-16: qty 1000

## 2014-03-16 MED ORDER — OXYCODONE-ACETAMINOPHEN 5-325 MG PO TABS
1.0000 | ORAL_TABLET | ORAL | Status: DC | PRN
  Administered 2014-03-16: 1 via ORAL
  Filled 2014-03-16: qty 1

## 2014-03-16 MED ORDER — CEFAZOLIN SODIUM 1-5 GM-% IV SOLN
1.0000 g | Freq: Three times a day (TID) | INTRAVENOUS | Status: AC
Start: 2014-03-16 — End: 2014-03-17
  Administered 2014-03-16 – 2014-03-17 (×2): 1 g via INTRAVENOUS
  Filled 2014-03-16 (×2): qty 50

## 2014-03-16 MED ORDER — NEOSTIGMINE METHYLSULFATE 10 MG/10ML IV SOLN
INTRAVENOUS | Status: DC | PRN
  Administered 2014-03-16: 4 mg via INTRAVENOUS

## 2014-03-16 MED ORDER — SCOPOLAMINE 1 MG/3DAYS TD PT72
MEDICATED_PATCH | TRANSDERMAL | Status: AC
  Filled 2014-03-16: qty 1

## 2014-03-16 MED ORDER — DIAZEPAM 5 MG PO TABS: 5.0000 mg | ORAL_TABLET | Freq: Four times a day (QID) | ORAL | Status: DC | PRN

## 2014-03-16 MED ORDER — GLYCOPYRROLATE 0.2 MG/ML IJ SOLN
INTRAMUSCULAR | Status: DC | PRN
  Administered 2014-03-16: .8 mg via INTRAVENOUS

## 2014-03-16 SURGICAL SUPPLY — 53 items
BENZOIN TINCTURE PRP APPL 2/3 (GAUZE/BANDAGES/DRESSINGS) ×2 IMPLANT
BLADE 10 SAFETY STRL DISP (BLADE) ×2 IMPLANT
BLADE SURG ROTATE 9660 (MISCELLANEOUS) IMPLANT
BUR ACORN 6.0 (BURR) ×2 IMPLANT
BUR MATCHSTICK NEURO 3.0 LAGG (BURR) IMPLANT
CANISTER SUCTION 2500CC (MISCELLANEOUS) ×2 IMPLANT
CONT SPEC 4OZ CLIKSEAL STRL BL (MISCELLANEOUS) ×2 IMPLANT
DRAPE LAPAROTOMY 100X72X124 (DRAPES) ×2 IMPLANT
DRAPE MICROSCOPE LEICA (MISCELLANEOUS) ×2 IMPLANT
DRAPE POUCH INSTRU U-SHP 10X18 (DRAPES) ×2 IMPLANT
DRSG OPSITE POSTOP 4X6 (GAUZE/BANDAGES/DRESSINGS) ×2 IMPLANT
DRSG PAD ABDOMINAL 8X10 ST (GAUZE/BANDAGES/DRESSINGS) IMPLANT
DURAPREP 26ML APPLICATOR (WOUND CARE) ×2 IMPLANT
ELECT REM PT RETURN 9FT ADLT (ELECTROSURGICAL) ×2
ELECTRODE REM PT RTRN 9FT ADLT (ELECTROSURGICAL) ×1 IMPLANT
GAUZE SPONGE 4X4 16PLY XRAY LF (GAUZE/BANDAGES/DRESSINGS) IMPLANT
GLOVE BIOGEL M 8.0 STRL (GLOVE) ×2 IMPLANT
GLOVE BIOGEL PI IND STRL 8 (GLOVE) ×1 IMPLANT
GLOVE BIOGEL PI INDICATOR 8 (GLOVE) ×1
GLOVE ECLIPSE 7.5 STRL STRAW (GLOVE) ×2 IMPLANT
GLOVE EXAM NITRILE LRG STRL (GLOVE) IMPLANT
GLOVE EXAM NITRILE MD LF STRL (GLOVE) IMPLANT
GLOVE EXAM NITRILE XL STR (GLOVE) IMPLANT
GLOVE EXAM NITRILE XS STR PU (GLOVE) IMPLANT
GOWN BRE IMP SLV AUR LG STRL (GOWN DISPOSABLE) ×2 IMPLANT
GOWN STRL REIN 2XL LVL4 (GOWN DISPOSABLE) IMPLANT
GOWN STRL REUS W/ TWL XL LVL3 (GOWN DISPOSABLE) IMPLANT
GOWN STRL REUS W/TWL XL LVL3 (GOWN DISPOSABLE) ×4 IMPLANT
KIT BASIN OR (CUSTOM PROCEDURE TRAY) ×2 IMPLANT
KIT ROOM TURNOVER OR (KITS) ×2 IMPLANT
NEEDLE HYPO 18GX1.5 BLUNT FILL (NEEDLE) IMPLANT
NEEDLE HYPO 21X1.5 SAFETY (NEEDLE) IMPLANT
NEEDLE HYPO 25X1 1.5 SAFETY (NEEDLE) IMPLANT
NEEDLE SPNL 20GX3.5 QUINCKE YW (NEEDLE) IMPLANT
NS IRRIG 1000ML POUR BTL (IV SOLUTION) ×2 IMPLANT
PACK LAMINECTOMY NEURO (CUSTOM PROCEDURE TRAY) ×2 IMPLANT
PAD ARMBOARD 7.5X6 YLW CONV (MISCELLANEOUS) ×8 IMPLANT
PATTIES SURGICAL .5 X1 (DISPOSABLE) ×2 IMPLANT
RUBBERBAND STERILE (MISCELLANEOUS) ×4 IMPLANT
SPONGE GAUZE 4X4 12PLY (GAUZE/BANDAGES/DRESSINGS) ×2 IMPLANT
SPONGE LAP 4X18 X RAY DECT (DISPOSABLE) IMPLANT
SPONGE SURGIFOAM ABS GEL SZ50 (HEMOSTASIS) ×2 IMPLANT
STRIP CLOSURE SKIN 1/2X4 (GAUZE/BANDAGES/DRESSINGS) ×2 IMPLANT
SUT VIC AB 0 CT1 18XCR BRD8 (SUTURE) ×1 IMPLANT
SUT VIC AB 0 CT1 8-18 (SUTURE) ×1
SUT VIC AB 2-0 CP2 18 (SUTURE) ×2 IMPLANT
SUT VIC AB 3-0 SH 8-18 (SUTURE) ×2 IMPLANT
SYR 20CC LL (SYRINGE) ×2 IMPLANT
SYR 20ML ECCENTRIC (SYRINGE) ×2 IMPLANT
SYR 5ML LL (SYRINGE) IMPLANT
TOWEL OR 17X24 6PK STRL BLUE (TOWEL DISPOSABLE) ×2 IMPLANT
TOWEL OR 17X26 10 PK STRL BLUE (TOWEL DISPOSABLE) ×2 IMPLANT
WATER STERILE IRR 1000ML POUR (IV SOLUTION) ×2 IMPLANT

## 2014-03-16 NOTE — Anesthesia Preprocedure Evaluation (Addendum)
Anesthesia Evaluation  Patient identified by MRN, date of birth, ID band Patient awake    Reviewed: Allergy & Precautions, H&P , NPO status , Patient's Chart, lab work & pertinent test results  History of Anesthesia Complications (+) PONV  Airway Mallampati: II      Dental   Pulmonary  breath sounds clear to auscultation        Cardiovascular hypertension, Rhythm:Regular Rate:Normal     Neuro/Psych    GI/Hepatic negative GI ROS, Neg liver ROS,   Endo/Other  diabetes  Renal/GU Renal disease     Musculoskeletal   Abdominal   Peds  Hematology   Anesthesia Other Findings   Reproductive/Obstetrics                         Anesthesia Physical Anesthesia Plan  ASA: III  Anesthesia Plan: General   Post-op Pain Management:    Induction: Intravenous  Airway Management Planned: Oral ETT  Additional Equipment:   Intra-op Plan:   Post-operative Plan: Extubation in OR  Informed Consent: I have reviewed the patients History and Physical, chart, labs and discussed the procedure including the risks, benefits and alternatives for the proposed anesthesia with the patient or authorized representative who has indicated his/her understanding and acceptance.   Dental advisory given  Plan Discussed with:   Anesthesia Plan Comments:        Anesthesia Quick Evaluation

## 2014-03-16 NOTE — Progress Notes (Signed)
Utilization review completed.  

## 2014-03-16 NOTE — Transfer of Care (Signed)
Immediate Anesthesia Transfer of Care Note  Patient: [name]  Procedure(s) Performed: Procedure(s): LUMBAR LAMINECTOMY/DECOMPRESSION MICRODISCECTOMY 1 LEVEL  lumbar level three/four (Left)  Patient Location: PACU  Anesthesia Type:General  Level of Consciousness: awake, alert  and oriented  Airway & Oxygen Therapy: Patient Spontanous Breathing  Post-op Assessment: Report given to PACU RN  Post vital signs: Reviewed and stable  Complications: No apparent anesthesia complications

## 2014-03-16 NOTE — Anesthesia Postprocedure Evaluation (Signed)
  Anesthesia Post-op Note  Patient: [name]  Procedure(s) Performed: Procedure(s): LUMBAR LAMINECTOMY/DECOMPRESSION MICRODISCECTOMY 1 LEVEL  lumbar level three/four (Left)  Patient Location: PACU  Anesthesia Type:General  Level of Consciousness: awake  Airway and Oxygen Therapy: Patient Spontanous Breathing  Post-op Pain: mild  Post-op Assessment: Post-op Vital signs reviewed  Post-op Vital Signs: Reviewed  Last Vitals:  Filed Vitals:   03/16/14 1239  BP: 153/81  Pulse: 74  Temp: 36.3 C  Resp: 12    Complications: No apparent anesthesia complications

## 2014-03-17 ENCOUNTER — Inpatient Hospital Stay (HOSPITAL_COMMUNITY)
Admission: EM | Admit: 2014-03-17 | Discharge: 2014-03-19 | DRG: 690 | Disposition: A | Discharge status: Home / Self Care | Payer: Medicare Other | Attending: Internal Medicine | Admitting: Internal Medicine

## 2014-03-17 ENCOUNTER — Encounter (HOSPITAL_COMMUNITY): Payer: Self-pay | Admitting: Emergency Medicine

## 2014-03-17 DIAGNOSIS — E1149 Type 2 diabetes mellitus with other diabetic neurological complication: Secondary | ICD-10-CM | POA: Diagnosis present

## 2014-03-17 DIAGNOSIS — R739 Hyperglycemia, unspecified: Secondary | ICD-10-CM

## 2014-03-17 DIAGNOSIS — Z794 Long term (current) use of insulin: Secondary | ICD-10-CM

## 2014-03-17 DIAGNOSIS — E1165 Type 2 diabetes mellitus with hyperglycemia: Secondary | ICD-10-CM

## 2014-03-17 DIAGNOSIS — M51379 Other intervertebral disc degeneration, lumbosacral region without mention of lumbar back pain or lower extremity pain: Secondary | ICD-10-CM | POA: Diagnosis present

## 2014-03-17 DIAGNOSIS — N183 Chronic kidney disease, stage 3 unspecified: Secondary | ICD-10-CM | POA: Diagnosis present

## 2014-03-17 DIAGNOSIS — N179 Acute kidney failure, unspecified: Secondary | ICD-10-CM

## 2014-03-17 DIAGNOSIS — R509 Fever, unspecified: Secondary | ICD-10-CM

## 2014-03-17 DIAGNOSIS — E1142 Type 2 diabetes mellitus with diabetic polyneuropathy: Secondary | ICD-10-CM | POA: Diagnosis present

## 2014-03-17 DIAGNOSIS — I129 Hypertensive chronic kidney disease with stage 1 through stage 4 chronic kidney disease, or unspecified chronic kidney disease: Secondary | ICD-10-CM | POA: Diagnosis present

## 2014-03-17 DIAGNOSIS — R5381 Other malaise: Secondary | ICD-10-CM | POA: Diagnosis not present

## 2014-03-17 DIAGNOSIS — Z79899 Other long term (current) drug therapy: Secondary | ICD-10-CM

## 2014-03-17 DIAGNOSIS — E785 Hyperlipidemia, unspecified: Secondary | ICD-10-CM

## 2014-03-17 DIAGNOSIS — Z823 Family history of stroke: Secondary | ICD-10-CM

## 2014-03-17 DIAGNOSIS — M545 Low back pain, unspecified: Secondary | ICD-10-CM | POA: Diagnosis present

## 2014-03-17 DIAGNOSIS — Z8042 Family history of malignant neoplasm of prostate: Secondary | ICD-10-CM

## 2014-03-17 DIAGNOSIS — R5383 Other fatigue: Secondary | ICD-10-CM | POA: Diagnosis not present

## 2014-03-17 DIAGNOSIS — G8929 Other chronic pain: Secondary | ICD-10-CM | POA: Diagnosis present

## 2014-03-17 DIAGNOSIS — Z9849 Cataract extraction status, unspecified eye: Secondary | ICD-10-CM

## 2014-03-17 DIAGNOSIS — D649 Anemia, unspecified: Secondary | ICD-10-CM

## 2014-03-17 DIAGNOSIS — M48061 Spinal stenosis, lumbar region without neurogenic claudication: Secondary | ICD-10-CM

## 2014-03-17 DIAGNOSIS — Z6836 Body mass index (BMI) 36.0-36.9, adult: Secondary | ICD-10-CM

## 2014-03-17 DIAGNOSIS — Z23 Encounter for immunization: Secondary | ICD-10-CM

## 2014-03-17 DIAGNOSIS — N390 Urinary tract infection, site not specified: Principal | ICD-10-CM

## 2014-03-17 DIAGNOSIS — Z8249 Family history of ischemic heart disease and other diseases of the circulatory system: Secondary | ICD-10-CM

## 2014-03-17 DIAGNOSIS — E119 Type 2 diabetes mellitus without complications: Secondary | ICD-10-CM | POA: Diagnosis present

## 2014-03-17 DIAGNOSIS — I10 Essential (primary) hypertension: Secondary | ICD-10-CM

## 2014-03-17 DIAGNOSIS — M5137 Other intervertebral disc degeneration, lumbosacral region: Secondary | ICD-10-CM | POA: Diagnosis present

## 2014-03-17 DIAGNOSIS — D62 Acute posthemorrhagic anemia: Secondary | ICD-10-CM

## 2014-03-17 DIAGNOSIS — E860 Dehydration: Secondary | ICD-10-CM | POA: Diagnosis present

## 2014-03-17 DIAGNOSIS — Z96659 Presence of unspecified artificial knee joint: Secondary | ICD-10-CM

## 2014-03-17 DIAGNOSIS — G250 Essential tremor: Secondary | ICD-10-CM | POA: Diagnosis present

## 2014-03-17 DIAGNOSIS — Z820 Family history of epilepsy and other diseases of the nervous system: Secondary | ICD-10-CM

## 2014-03-17 DIAGNOSIS — E669 Obesity, unspecified: Secondary | ICD-10-CM | POA: Diagnosis present

## 2014-03-17 DIAGNOSIS — K8020 Calculus of gallbladder without cholecystitis without obstruction: Secondary | ICD-10-CM

## 2014-03-17 DIAGNOSIS — I158 Other secondary hypertension: Secondary | ICD-10-CM

## 2014-03-17 DIAGNOSIS — G252 Other specified forms of tremor: Secondary | ICD-10-CM

## 2014-03-17 LAB — CBC
HCT: 23.4 % — ABNORMAL LOW (ref 36.0–46.0)
Hemoglobin: 7.8 g/dL — ABNORMAL LOW (ref 12.0–15.0)
MCH: 29 pg (ref 26.0–34.0)
MCHC: 33.3 g/dL (ref 30.0–36.0)
MCV: 87 fL (ref 78.0–100.0)
Platelets: 255 10*3/uL (ref 150–400)
RBC: 2.69 MIL/uL — ABNORMAL LOW (ref 3.87–5.11)
RDW: 13.8 % (ref 11.5–15.5)
WBC: 12.3 10*3/uL — ABNORMAL HIGH (ref 4.0–10.5)

## 2014-03-17 LAB — GLUCOSE, CAPILLARY: Glucose-Capillary: 175 mg/dL — ABNORMAL HIGH (ref 70–99)

## 2014-03-17 LAB — CBG MONITORING, ED: Glucose-Capillary: 335 mg/dL — ABNORMAL HIGH (ref 70–99)

## 2014-03-17 MED ORDER — OXYCODONE-ACETAMINOPHEN 5-325 MG PO TABS: 1.0000 | ORAL_TABLET | ORAL | Status: DC | PRN

## 2014-03-17 NOTE — Discharge Instructions (Signed)
No lifting no bending no twisting no driving a riding a car less she's come back and forth to see Dr. Joya Salm

## 2014-03-17 NOTE — ED Notes (Signed)
Pt presents with hyperglycemia, pt states she took it at home result high. Per family pt has been repeating questions at home, visual hallucinations. Pt did have procedure cleaning disk yesterday at [facility]

## 2014-03-17 NOTE — Op Note (Signed)
[name], STREBEL                ACCOUNT NO.:  [id number]  MEDICAL RECORD NO.:  [id number]  LOCATION:  3C09C                        FACILITY:  [place]  PHYSICIAN:  Leeroy Cha, M.D.   DATE OF BIRTH:  [birth year]  DATE OF PROCEDURE:  03/16/2014 DATE OF DISCHARGE:                              OPERATIVE REPORT   PREOPERATIVE DIAGNOSES:  Left L3-L4 stenosis.  Foraminal with a chronic radiculopathy, left side.  POSTOPERATIVE DIAGNOSES:  Left L3-L4 stenosis.  Foraminal with a chronic radiculopathy, left side.  PROCEDURE:  Left L3-L4 laminotomy, foraminotomy, decompression of the L3 and L4 nerve root.  Lysis of adhesion.  Microscope.  SURGEON:  Leeroy Cha, M.D.  ASSISTANT:  Hosie Spangle, M.D.  CLINICAL HISTORY:  The patient had been coming to my office complaining of back pain worsened to the left leg.  In the past, she has surgery because of pain in the right leg.  She had failed with conservative treatment.  MRI shows stenosis secondary to hypertrophy of facet with foraminal narrowing compromising the L3 and L4 nerve root.  Surgery was advised and she knew the risk and benefit of the surgery.  PROCEDURE IN DETAIL:  The patient was taken to the OR, and after intubation, she was positioned in a prone manner.  The back was cleaned with DuraPrep.  X-rays showed we were right at the level of L4.  Then, midline incision from L3-L4 was made on the left side.  A muscle retracted laterally.  A second x-ray showed that indeed we were right at the L3-4.  Then with the microscope and the drill, we drilled the lower part of L3 and the upper part of L4.  The patient had quite a bit of calcified yellow ligament.  Removal was achieved.  Then, with the 2, 3, 4, 5, Kerrison punch, we were able to decompress the L3 and L4 nerve root.  Between both of them,  the L3 was more compromised.  At the end, we had plenty of space for the thecal sac at the L3 and L4 nerve root. The area was  irrigated.  From then on, the wound was closed with Vicryl and Steri-Strips.          ______________________________ Leeroy Cha, M.D.     EB/MEDQ  D:  03/16/2014  T:  03/17/2014  Job:  [id number]

## 2014-03-17 NOTE — Plan of Care (Signed)
Problem: Consults Goal: Diagnosis - Spinal Surgery Outcome: Completed/Met Date Met:  03/16/14 Lumbar Laminectomy (Complex)

## 2014-03-17 NOTE — Progress Notes (Signed)
Patient ID: [name], female   DOB: [birth year], 80 y.o.   MRN: [id number] Patient doing well significant improvement preoperative radicular symptoms wound with mild saturation plan discharge home

## 2014-03-17 NOTE — Evaluation (Signed)
Occupational Therapy Evaluation Patient Details Name: [name] MRN: [id number] DOB: Aug 19, 1933 Today's Date: 03/17/2014    History of Present Illness Patient is a 80 yo female admitted 03/16/14 with spinal stenosis.  Patient is s/p L3-4 decompressive laminotomy.  PMH:  cervical surgery, essential tremors, HTN, chronic back pain, peripheral edema, Lt wrist fracture 2014, DM, peripheral neuropathy.   Clinical Impression   Pt educated in back precautions and safety related to ADL and IADL.  Pt with plans for home with intermittent assist of her daughter and granddaughter. Recommend HHOT to help pt generalize back precautions at home and [facility] aide to supervise showering for safety.    Follow Up Recommendations  Home health OT;Supervision - Intermittent    Equipment Recommendations       Recommendations for Other Services       Precautions / Restrictions Precautions Precautions: Back;Fall Precaution Booklet Issued: Yes (comment) Precaution Comments: reviewed back precautions related to ADL and IADL Restrictions Weight Bearing Restrictions: No      Mobility Bed Mobility Overal bed mobility: Modified Independent             General bed mobility comments: Instructed patient on log rolling technique and moving sidelying <> sit.  Patient required increased time for mobility.  Able to complete without physical assist.  Transfers Overall transfer level: Modified independent Equipment used: None             General transfer comment: Verbal cues for technique, keeping back in good alignment.  Reminders to avoid trunk flexion during transitions.  Able to complete without physical assist.    Balance Overall balance assessment: Needs assistance         Standing balance support: Single extremity supported Standing balance-Leahy Scale: Fair Standing balance comment: Unsteady with dynamic activities.                            ADL Overall ADL's : Needs  assistance/impaired Eating/Feeding: Independent;Sitting   Grooming: Supervision/safety;Standing   Upper Body Bathing: Supervision/ safety;Standing   Lower Body Bathing: Supervison/ safety;Sit to/from stand;With adaptive equipment   Upper Body Dressing : Independent;Sitting   Lower Body Dressing: Supervision/safety;Sit to/from stand;With adaptive equipment   Toilet Transfer: Supervision/safety;Regular Toilet;Grab bars   Toileting- Clothing Manipulation and Hygiene: Supervision/safety;Sit to/from stand;Cueing for back precautions       Functional mobility during ADLs: Supervision/safety General ADL Comments: Instructed pt in use of AE for LB bathing and dressing and for pericare adhering to back precautions.  Educated at length in IADL.  Will have assist of family for laundry, groceries, changing bed, and heavy cleaning.     Vision                     Perception     Praxis      Pertinent Vitals/Pain No pain reported, VSS     Hand Dominance Right   Extremity/Trunk Assessment Upper Extremity Assessment Upper Extremity Assessment: Overall WFL for tasks assessed   Lower Extremity Assessment Lower Extremity Assessment: Defer to PT evaluation   Cervical / Trunk Assessment Cervical / Trunk Assessment: Normal   Communication Communication Communication: No difficulties   Cognition Arousal/Alertness: Awake/alert Behavior During Therapy: WFL for tasks assessed/performed Overall Cognitive Status: Within Functional Limits for tasks assessed                     General Comments       Exercises  Shoulder Instructions      Home Living Family/patient expects to be discharged to:: Private residence Living Arrangements: Alone Available Help at Discharge: Family;Available PRN/intermittently Type of Home: Apartment Home Access: Level entry     Home Layout: One level     Bathroom Shower/Tub: Teacher, early years/pre: Handicapped  height     Home Equipment: Environmental consultant - 2 wheels;Cane - single point;Walker - 4 wheels;Grab bars - tub/shower;Tub bench;Adaptive equipment Adaptive Equipment: Reacher        Prior Functioning/Environment Level of Independence: Independent with assistive device(s)        Comments: Patient using RW most recently for safety/balance.     OT Diagnosis: Generalized weakness;Acute pain   OT Problem List: Decreased knowledge of use of DME or AE;Decreased knowledge of precautions   OT Treatment/Interventions:      OT Goals(Current goals can be found in the care plan section) Acute Rehab OT Goals Patient Stated Goal: home today with daughter and granddaughter's assist  OT Frequency:     Barriers to D/C:            Co-evaluation              End of Session    Activity Tolerance: Patient tolerated treatment well Patient left: in bed;with call bell/phone within reach   Time: 0940-1004 OT Time Calculation (min): 24 min Charges:  OT General Charges $OT Visit: 1 Procedure OT Evaluation $Initial OT Evaluation Tier I: 1 Procedure OT Treatments $Self Care/Home Management : 8-22 mins G-Codes:    Malka So 03/17/2014, 10:05 AM [phone]

## 2014-03-17 NOTE — Discharge Summary (Signed)
  Physician Discharge Summary  Patient ID: [name] MRN: [id number] DOB/AGE: 1933-04-04 80 y.o.  Admit date: 03/16/2014 Discharge date: 03/17/2014  Admission Diagnoses: Lumbar spinal stenosis L3-4  Discharge Diagnoses: Same Active Problems:   Lumbar stenosis   Discharged Condition: good  Hospital Course: Patient is admitted hospital underwent decompressive laminectomy laminotomies at L3-4 postop patient did very well recovered in the floor on the floor she's angling and voiding spontaneously tolerating regular diet was stable for discharge home.  Consults: Significant Diagnostic Studies: Treatments: L3-4 decompressive laminotomy Discharge Exam: Blood pressure 113/64, pulse 89, temperature 98.5 F (36.9 C), temperature source Oral, resp. rate 18, SpO2 95.00%. Strength out of 5 wound clean and dry  Disposition: Home     Medication List         acetaminophen 500 MG tablet  Commonly known as:  TYLENOL  Take 500 mg by mouth 3 (three) times daily. Takes with gabapentin.     BAYER CONTOUR NEXT TEST test strip  Generic drug:  glucose blood  1 each by Other route 3 (three) times daily.     chlorthalidone 25 MG tablet  Commonly known as:  HYGROTON  Take 25 mg by mouth daily.     cloNIDine 0.1 MG tablet  Commonly known as:  CATAPRES  Take 0.1 mg by mouth 2 (two) times daily.     gabapentin 300 MG capsule  Commonly known as:  NEURONTIN  Take 300 mg by mouth 3 (three) times daily.     insulin glargine 100 UNIT/ML injection  Commonly known as:  LANTUS  Inject 65 Units into the skin at bedtime.     metFORMIN 1000 MG tablet  Commonly known as:  GLUCOPHAGE  Take 1,000 mg by mouth daily with breakfast.     oxyCODONE-acetaminophen 5-325 MG per tablet  Commonly known as:  PERCOCET/ROXICET  Take 1 tablet by mouth every 6 (six) hours as needed for moderate pain or severe pain.     oxyCODONE-acetaminophen 5-325 MG per tablet  Commonly known as:  PERCOCET/ROXICET  Take  1-2 tablets by mouth every 4 (four) hours as needed for moderate pain.     pravastatin 40 MG tablet  Commonly known as:  PRAVACHOL  Take 40 mg by mouth daily.     propranolol 10 MG tablet  Commonly known as:  INDERAL  Take 20 mg by mouth 2 (two) times daily.     TRADJENTA 5 MG Tabs tablet  Generic drug:  linagliptin  Take 5 mg by mouth daily.           Follow-up Information   Follow up with [name], MD.   Specialty:  Neurosurgery   Contact information:   [place] [address] [phone]       Signed: Elaina Hoops 03/17/2014, 6:47 AM

## 2014-03-17 NOTE — Evaluation (Addendum)
Physical Therapy Evaluation Patient Details Name: [name] MRN: [id number] DOB: 09/14/1932 Today's Date: 03/17/2014   History of Present Illness  Patient is a 80 yo female admitted 03/16/14 with spinal stenosis.  Patient is s/p L3-4 decompressive laminotomy.  PMH:  cervical surgery, essential tremors, HTN, chronic back pain, peripheral edema, Lt wrist fracture 2014, DM, peripheral neuropathy.  Clinical Impression  Patient presents with problems listed below.  To be discharged home today.  Patient lives alone.  Recommend f/u HHPT for continued therapy for balance, back precautions, and mobility, and HH aide to assist with ADL's.    Follow Up Recommendations Home health PT;Supervision - Intermittent ([place] aide for ADL assist (patient lives alone))    Equipment Recommendations  None recommended by PT    Recommendations for Other Services       Precautions / Restrictions Precautions Precautions: Back;Fall Precaution Booklet Issued: Yes (comment) Precaution Comments: Reviewed back precautions with patient and integrating precautions into functional mobility.   Restrictions Weight Bearing Restrictions: No      Mobility  Bed Mobility Overal bed mobility: Modified Independent             General bed mobility comments: Instructed patient on log rolling technique and moving sidelying <> sit.  Patient required increased time for mobility.  Able to complete without physical assist.  Transfers Overall transfer level: Modified independent Equipment used: None             General transfer comment: Verbal cues for technique, keeping back in good alignment.  Reminders to avoid trunk flexion during transitions.  Able to complete without physical assist.  Ambulation/Gait Ambulation/Gait assistance: Min guard Ambulation Distance (Feet): 64 Feet Assistive device: None Gait Pattern/deviations: Step-through pattern;Decreased stride length;Shuffle Gait velocity: Decreased Gait  velocity interpretation: Below normal speed for age/gender General Gait Details: Patient with unsteady gait, reaching for wall and furniture.  Instructed patient to use RW at home for balance/safety.  Stairs            Wheelchair Mobility    Modified Rankin (Stroke Patients Only)       Balance Overall balance assessment: Needs assistance         Standing balance support: Single extremity supported Standing balance-Leahy Scale: Fair Standing balance comment: Unsteady with dynamic activities.                             Pertinent Vitals/Pain Pain 6/10 in back with mobility.    Home Living Family/patient expects to be discharged to:: Private residence Living Arrangements: Alone Available Help at Discharge: Family;Available PRN/intermittently (daughter) Type of Home: Apartment Home Access: Level entry     Home Layout: One level Home Equipment: Walker - 2 wheels;Cane - single point;Shower seat;Walker - 4 wheels (Lift chair)      Prior Function Level of Independence: Independent with assistive device(s)         Comments: Patient using RW most recently for safety/balance.      Hand Dominance        Extremity/Trunk Assessment   Upper Extremity Assessment: Defer to OT evaluation           Lower Extremity Assessment: Generalized weakness (Noted edema bil LE's)         Communication   Communication: No difficulties  Cognition Arousal/Alertness: Awake/alert Behavior During Therapy: WFL for tasks assessed/performed Overall Cognitive Status: Within Functional Limits for tasks assessed  General Comments      Exercises        Assessment/Plan    PT Assessment All further PT needs can be met in the next venue of care  PT Diagnosis Abnormality of gait;Difficulty walking;Acute pain   PT Problem List Decreased strength;Decreased balance;Decreased mobility;Decreased knowledge of precautions;Obesity;Pain  PT  Treatment Interventions     PT Goals (Current goals can be found in the Care Plan section)      Frequency     Barriers to discharge        Co-evaluation               End of Session Equipment Utilized During Treatment: Gait belt Activity Tolerance: Patient tolerated treatment well Patient left: in bed;with call bell/phone within reach (sitting EOB for breakfast) Nurse Communication: Mobility status (D/C needs)         Time: 7308-5694 PT Time Calculation (min): 27 min   Charges:   PT Evaluation $Initial PT Evaluation Tier I: 1 Procedure PT Treatments $Gait Training: 8-22 mins $Therapeutic Activity: 8-22 mins   PT G Codes:          Despina Pole 03/17/2014, 8:56 AM Carita Pian. Sanjuana Kava, [place] Pager [phone]

## 2014-03-17 NOTE — Progress Notes (Signed)
Pt and daughter given D/C instructions with Rx, verbal understanding of teaching was given. Pt's IV was removed prior to D/C. Pt D/C'd home via wheelchair @ 1050 per MD order. Pt's home health was arranged prior to D/C. Pt is stable @ D/C and has no other needs at this. Holli Humbles, RN

## 2014-03-17 NOTE — Care Management Note (Signed)
    Page 1 of 1   03/17/2014     1:08:18 PM CARE MANAGEMENT NOTE 03/17/2014  Patient:  [name], [name]   Account Number:  [id number]  Date Initiated:  03/17/2014  Documentation initiated by:  [facility]  Subjective/Objective Assessment:   adm: L leg pain     Action/Plan:   discharge planning   Anticipated DC Date:  03/17/2014   Anticipated DC Plan:  [place]  CM consult      [facility] Choice  HOME HEALTH   Choice offered to / List presented to:  C-1 Patient        [place] arranged  [place].   Status of service:  Completed, signed off Medicare Important Message given?   (If response is "NO", the following Medicare IM given date fields will be blank) Date Medicare IM given:   Medicare IM given by:   Date Additional Medicare IM given:   Additional Medicare IM given by:    Discharge Disposition:  [place]  Per UR Regulation:    If discussed at Long Length of Stay Meetings, dates discussed:    Comments:  03/17/14 09:29 CM met with pt in room to offer choice for [facility] services.  Pt chooses [facility] to render HHPT/OT/Aide.  Pt states she has had previous knee sx and is NOT in need of any equipment.  Referral texted to [facility] rep.  No other CM needs were communicated.  Mariane Masters, BSN, CM [phone].

## 2014-03-18 ENCOUNTER — Emergency Department (HOSPITAL_COMMUNITY): Payer: Medicare Other

## 2014-03-18 DIAGNOSIS — E1142 Type 2 diabetes mellitus with diabetic polyneuropathy: Secondary | ICD-10-CM | POA: Diagnosis present

## 2014-03-18 DIAGNOSIS — E119 Type 2 diabetes mellitus without complications: Secondary | ICD-10-CM

## 2014-03-18 DIAGNOSIS — N183 Chronic kidney disease, stage 3 unspecified: Secondary | ICD-10-CM | POA: Diagnosis present

## 2014-03-18 DIAGNOSIS — Z23 Encounter for immunization: Secondary | ICD-10-CM | POA: Diagnosis not present

## 2014-03-18 DIAGNOSIS — Z823 Family history of stroke: Secondary | ICD-10-CM | POA: Diagnosis not present

## 2014-03-18 DIAGNOSIS — D62 Acute posthemorrhagic anemia: Secondary | ICD-10-CM | POA: Diagnosis present

## 2014-03-18 DIAGNOSIS — I10 Essential (primary) hypertension: Secondary | ICD-10-CM

## 2014-03-18 DIAGNOSIS — Z79899 Other long term (current) drug therapy: Secondary | ICD-10-CM | POA: Diagnosis not present

## 2014-03-18 DIAGNOSIS — I129 Hypertensive chronic kidney disease with stage 1 through stage 4 chronic kidney disease, or unspecified chronic kidney disease: Secondary | ICD-10-CM | POA: Diagnosis present

## 2014-03-18 DIAGNOSIS — E1149 Type 2 diabetes mellitus with other diabetic neurological complication: Secondary | ICD-10-CM | POA: Diagnosis present

## 2014-03-18 DIAGNOSIS — Z820 Family history of epilepsy and other diseases of the nervous system: Secondary | ICD-10-CM | POA: Diagnosis not present

## 2014-03-18 DIAGNOSIS — N390 Urinary tract infection, site not specified: Principal | ICD-10-CM | POA: Diagnosis present

## 2014-03-18 DIAGNOSIS — Z96659 Presence of unspecified artificial knee joint: Secondary | ICD-10-CM | POA: Diagnosis not present

## 2014-03-18 DIAGNOSIS — E785 Hyperlipidemia, unspecified: Secondary | ICD-10-CM | POA: Diagnosis present

## 2014-03-18 DIAGNOSIS — R739 Hyperglycemia, unspecified: Secondary | ICD-10-CM | POA: Diagnosis present

## 2014-03-18 DIAGNOSIS — R7309 Other abnormal glucose: Secondary | ICD-10-CM

## 2014-03-18 DIAGNOSIS — N179 Acute kidney failure, unspecified: Secondary | ICD-10-CM | POA: Diagnosis present

## 2014-03-18 DIAGNOSIS — G250 Essential tremor: Secondary | ICD-10-CM | POA: Diagnosis present

## 2014-03-18 DIAGNOSIS — R509 Fever, unspecified: Secondary | ICD-10-CM

## 2014-03-18 DIAGNOSIS — Z6836 Body mass index (BMI) 36.0-36.9, adult: Secondary | ICD-10-CM | POA: Diagnosis not present

## 2014-03-18 DIAGNOSIS — E669 Obesity, unspecified: Secondary | ICD-10-CM | POA: Diagnosis present

## 2014-03-18 DIAGNOSIS — M545 Low back pain, unspecified: Secondary | ICD-10-CM | POA: Diagnosis present

## 2014-03-18 DIAGNOSIS — Z9849 Cataract extraction status, unspecified eye: Secondary | ICD-10-CM | POA: Diagnosis not present

## 2014-03-18 DIAGNOSIS — Z8249 Family history of ischemic heart disease and other diseases of the circulatory system: Secondary | ICD-10-CM | POA: Diagnosis not present

## 2014-03-18 DIAGNOSIS — Z794 Long term (current) use of insulin: Secondary | ICD-10-CM | POA: Diagnosis not present

## 2014-03-18 DIAGNOSIS — M5137 Other intervertebral disc degeneration, lumbosacral region: Secondary | ICD-10-CM | POA: Diagnosis present

## 2014-03-18 DIAGNOSIS — Z8042 Family history of malignant neoplasm of prostate: Secondary | ICD-10-CM | POA: Diagnosis not present

## 2014-03-18 DIAGNOSIS — E860 Dehydration: Secondary | ICD-10-CM | POA: Diagnosis present

## 2014-03-18 DIAGNOSIS — R5381 Other malaise: Secondary | ICD-10-CM | POA: Diagnosis present

## 2014-03-18 DIAGNOSIS — G8929 Other chronic pain: Secondary | ICD-10-CM | POA: Diagnosis present

## 2014-03-18 LAB — URINE MICROSCOPIC-ADD ON

## 2014-03-18 LAB — COMPREHENSIVE METABOLIC PANEL
ALT: 11 U/L (ref 0–35)
AST: 21 U/L (ref 0–37)
Albumin: 3.4 g/dL — ABNORMAL LOW (ref 3.5–5.2)
Alkaline Phosphatase: 62 U/L (ref 39–117)
Anion gap: 12 (ref 5–15)
BUN: 29 mg/dL — ABNORMAL HIGH (ref 6–23)
CO2: 25 mEq/L (ref 19–32)
Calcium: 8.9 mg/dL (ref 8.4–10.5)
Chloride: 101 mEq/L (ref 96–112)
Creatinine, Ser: 1.4 mg/dL — ABNORMAL HIGH (ref 0.50–1.10)
GFR calc Af Amer: 40 mL/min — ABNORMAL LOW (ref >90)
GFR calc non Af Amer: 35 mL/min — ABNORMAL LOW (ref >90)
Glucose, Bld: 338 mg/dL — ABNORMAL HIGH (ref 70–99)
Potassium: 3.9 mEq/L (ref 3.7–5.3)
Sodium: 138 mEq/L (ref 137–147)
Total Bilirubin: 0.4 mg/dL (ref 0.3–1.2)
Total Protein: 6.9 g/dL (ref 6.0–8.3)

## 2014-03-18 LAB — URINALYSIS, ROUTINE W REFLEX MICROSCOPIC
Bilirubin Urine: NEGATIVE
Glucose, UA: >1000 mg/dL — AB
Hgb urine dipstick: NEGATIVE
Ketones, ur: NEGATIVE mg/dL
Nitrite: NEGATIVE
Protein, ur: NEGATIVE mg/dL
Specific Gravity, Urine: 1.021 (ref 1.005–1.030)
Urobilinogen, UA: 0.2 mg/dL (ref 0.0–1.0)
pH: 5 (ref 5.0–8.0)

## 2014-03-18 LAB — GLUCOSE, CAPILLARY
Glucose-Capillary: 186 mg/dL — ABNORMAL HIGH (ref 70–99)
Glucose-Capillary: 197 mg/dL — ABNORMAL HIGH (ref 70–99)
Glucose-Capillary: 205 mg/dL — ABNORMAL HIGH (ref 70–99)
Glucose-Capillary: 219 mg/dL — ABNORMAL HIGH (ref 70–99)

## 2014-03-18 LAB — CBC
HCT: 30.7 % — ABNORMAL LOW (ref 36.0–46.0)
Hemoglobin: 10 g/dL — ABNORMAL LOW (ref 12.0–15.0)
MCH: 28.4 pg (ref 26.0–34.0)
MCHC: 32.6 g/dL (ref 30.0–36.0)
MCV: 87.2 fL (ref 78.0–100.0)
Platelets: 191 10*3/uL (ref 150–400)
RBC: 3.52 MIL/uL — ABNORMAL LOW (ref 3.87–5.11)
RDW: 13.8 % (ref 11.5–15.5)
WBC: 11.4 10*3/uL — ABNORMAL HIGH (ref 4.0–10.5)

## 2014-03-18 LAB — HEMOGLOBIN A1C
Hgb A1c MFr Bld: 7.1 % — ABNORMAL HIGH (ref <5.7)
Mean Plasma Glucose: 157 mg/dL — ABNORMAL HIGH (ref <117)

## 2014-03-18 LAB — HEMOGLOBIN AND HEMATOCRIT, BLOOD
HCT: 31.5 % — ABNORMAL LOW (ref 36.0–46.0)
Hemoglobin: 10.4 g/dL — ABNORMAL LOW (ref 12.0–15.0)

## 2014-03-18 LAB — I-STAT CG4 LACTIC ACID, ED: Lactic Acid, Venous: 1.67 mmol/L (ref 0.5–2.2)

## 2014-03-18 MED ORDER — CLONIDINE HCL 0.1 MG PO TABS
0.1000 mg | ORAL_TABLET | Freq: Two times a day (BID) | ORAL | Status: DC
  Administered 2014-03-18 – 2014-03-19 (×3): 0.1 mg via ORAL
  Filled 2014-03-18 (×4): qty 1

## 2014-03-18 MED ORDER — LINAGLIPTIN 5 MG PO TABS
5.0000 mg | ORAL_TABLET | Freq: Every day | ORAL | Status: DC
  Administered 2014-03-18 – 2014-03-19 (×2): 5 mg via ORAL
  Filled 2014-03-18 (×2): qty 1

## 2014-03-18 MED ORDER — INSULIN ASPART 100 UNIT/ML ~~LOC~~ SOLN
0.0000 [IU] | Freq: Three times a day (TID) | SUBCUTANEOUS | Status: DC
  Administered 2014-03-18 (×2): 4 [IU] via SUBCUTANEOUS
  Administered 2014-03-18 – 2014-03-19 (×2): 7 [IU] via SUBCUTANEOUS
  Administered 2014-03-19: 4 [IU] via SUBCUTANEOUS

## 2014-03-18 MED ORDER — INSULIN GLARGINE 100 UNIT/ML ~~LOC~~ SOLN
65.0000 [IU] | Freq: Every day | SUBCUTANEOUS | Status: DC
  Administered 2014-03-18 (×2): 65 [IU] via SUBCUTANEOUS
  Filled 2014-03-18 (×4): qty 0.65

## 2014-03-18 MED ORDER — SODIUM CHLORIDE 0.9 % IV BOLUS (SEPSIS)
1000.0000 mL | Freq: Once | INTRAVENOUS | Status: AC
  Administered 2014-03-18: 1000 mL via INTRAVENOUS

## 2014-03-18 MED ORDER — MORPHINE SULFATE 4 MG/ML IJ SOLN
4.0000 mg | Freq: Once | INTRAMUSCULAR | Status: AC
  Administered 2014-03-18: 4 mg via INTRAVENOUS
  Filled 2014-03-18: qty 1

## 2014-03-18 MED ORDER — DEXTROSE 5 % IV SOLN
1.0000 g | INTRAVENOUS | Status: DC
  Administered 2014-03-18: 1 g via INTRAVENOUS
  Filled 2014-03-18 (×2): qty 10

## 2014-03-18 MED ORDER — SODIUM CHLORIDE 0.9 % IV SOLN
INTRAVENOUS | Status: DC
  Administered 2014-03-18 – 2014-03-19 (×2): via INTRAVENOUS

## 2014-03-18 MED ORDER — PNEUMOCOCCAL VAC POLYVALENT 25 MCG/0.5ML IJ INJ
0.5000 mL | INJECTION | INTRAMUSCULAR | Status: AC
  Administered 2014-03-19: 0.5 mL via INTRAMUSCULAR
  Filled 2014-03-18 (×2): qty 0.5

## 2014-03-18 MED ORDER — GABAPENTIN 300 MG PO CAPS
300.0000 mg | ORAL_CAPSULE | Freq: Three times a day (TID) | ORAL | Status: DC
  Administered 2014-03-18 – 2014-03-19 (×4): 300 mg via ORAL
  Filled 2014-03-18 (×7): qty 1

## 2014-03-18 MED ORDER — INSULIN GLARGINE 100 UNIT/ML ~~LOC~~ SOLN: 65.0000 [IU] | Freq: Every day | SUBCUTANEOUS | Status: DC

## 2014-03-18 MED ORDER — DEXTROSE 5 % IV SOLN
1.0000 g | Freq: Once | INTRAVENOUS | Status: AC
  Administered 2014-03-18: 1 g via INTRAVENOUS
  Filled 2014-03-18: qty 10

## 2014-03-18 MED ORDER — OXYCODONE-ACETAMINOPHEN 5-325 MG PO TABS
1.0000 | ORAL_TABLET | Freq: Four times a day (QID) | ORAL | Status: DC | PRN
  Administered 2014-03-18 – 2014-03-19 (×4): 1 via ORAL
  Filled 2014-03-18 (×5): qty 1

## 2014-03-18 MED ORDER — PROPRANOLOL HCL 20 MG PO TABS
20.0000 mg | ORAL_TABLET | Freq: Two times a day (BID) | ORAL | Status: DC
  Administered 2014-03-18 – 2014-03-19 (×3): 20 mg via ORAL
  Filled 2014-03-18 (×4): qty 1

## 2014-03-18 MED ORDER — ONDANSETRON HCL 4 MG/2ML IJ SOLN
4.0000 mg | Freq: Once | INTRAMUSCULAR | Status: AC
  Administered 2014-03-18: 4 mg via INTRAVENOUS
  Filled 2014-03-18: qty 2

## 2014-03-18 MED ORDER — METHOCARBAMOL 500 MG PO TABS
500.0000 mg | ORAL_TABLET | Freq: Four times a day (QID) | ORAL | Status: DC | PRN
  Administered 2014-03-18 – 2014-03-19 (×4): 500 mg via ORAL
  Filled 2014-03-18 (×5): qty 1

## 2014-03-18 MED ORDER — SIMVASTATIN 20 MG PO TABS
20.0000 mg | ORAL_TABLET | Freq: Every day | ORAL | Status: DC
  Administered 2014-03-18: 20 mg via ORAL
  Filled 2014-03-18 (×2): qty 1

## 2014-03-18 MED ORDER — ACETAMINOPHEN 500 MG PO TABS
500.0000 mg | ORAL_TABLET | Freq: Three times a day (TID) | ORAL | Status: DC
  Administered 2014-03-18 – 2014-03-19 (×4): 500 mg via ORAL
  Filled 2014-03-18 (×6): qty 1

## 2014-03-18 NOTE — ED Notes (Signed)
[place], EDP made aware of patient CG4 Lactic results.

## 2014-03-18 NOTE — Progress Notes (Signed)
Occupational Therapy Evaluation Patient Details Name: JESSLYN VIGLIONE MRN: [id number] DOB: 1933/02/26 Today's Date: 03/18/2014    History of Present Illness ZAKIRA RESSEL is a 80 y.o. female who just underwent surgical laminectomy 2 days ago, presents for concern for elevated blood sugar.  She was feeling ok when she returned home yesterday, but since arriving home has been having sharp pains radiating down her left hip and thigh similar to past radicular pains.  Also been very fatigued and slightly confused.    Clinical Impression   Patient presents to OT with decreased ADL independence and safety, will benefit from skilled OT to maximize ADL independence and facilitate safe discharge.    Follow Up Recommendations  Home health OT;Supervision - Intermittent; [facility] aide    Equipment Recommendations       Recommendations for Other Services PT consult     Precautions / Restrictions Precautions Precautions: Back;Fall Precaution Comments: reviewed back precautions related to ADL and IADL Restrictions Weight Bearing Restrictions: No      Mobility Bed Mobility                  Transfers                      Balance                                            ADL Overall ADL's : Needs assistance/impaired Eating/Feeding: Independent;Sitting   Grooming: Wash/dry face;Wash/dry hands;Set up   Upper Body Bathing: Set up;Sitting       Upper Body Dressing : Set up;Sitting                   Functional mobility during ADLs:  (pt sitting EOB but declined mobilizing due to LLE pain) General ADL Comments: reviewed use of AE for LB self-care. Patient states family to A with IADLs, and aide was set up when she discharged from [facility] to supervise showers.      Vision                     Perception     Praxis      Pertinent Vitals/Pain C/o LLE muscle spasms, states MD ordered medication to help with that     Hand Dominance Right    Extremity/Trunk Assessment Upper Extremity Assessment Upper Extremity Assessment: Overall WFL for tasks assessed   Lower Extremity Assessment Lower Extremity Assessment: Defer to PT evaluation (edema BLEs)   Cervical / Trunk Assessment Cervical / Trunk Assessment: Normal   Communication Communication Communication: No difficulties   Cognition Arousal/Alertness: Awake/alert Behavior During Therapy: WFL for tasks assessed/performed Overall Cognitive Status: Within Functional Limits for tasks assessed                     General Comments       Exercises       Shoulder Instructions      Home Living Family/patient expects to be discharged to:: Private residence Living Arrangements: Alone Available Help at Discharge: Family;Available PRN/intermittently Type of Home: Apartment Home Access: Level entry     Home Layout: One level     Bathroom Shower/Tub: Teacher,  years/pre: Handicapped height     Home Equipment: Environmental consultant - 2 wheels;Cane - single point;Walker - 4 wheels;Grab bars - tub/shower;Tub bench;Adaptive equipment Adaptive Equipment:  Reacher        Prior Functioning/Environment Level of Independence: Independent with assistive device(s)        Comments: Patient using RW most recently for safety/balance.     OT Diagnosis: Generalized weakness;Acute pain   OT Problem List: Decreased knowledge of use of DME or AE;Decreased knowledge of precautions   OT Treatment/Interventions:      OT Goals(Current goals can be found in the care plan section) Acute Rehab OT Goals Patient Stated Goal: home today with daughter and granddaughter's assist OT Goal Formulation: With patient Time For Goal Achievement: 04/01/14 Potential to Achieve Goals: Good  OT Frequency: Min 2X/week   Barriers to D/C:            Co-evaluation              End of Session    Activity Tolerance: Patient tolerated treatment well Patient left: in bed;with call  bell/phone within reach   Time: 0852-0905 OT Time Calculation (min): 13 min Charges:  OT General Charges $OT Visit: 1 Procedure OT Evaluation $Initial OT Evaluation Tier I: 1 Procedure OT Treatments $Self Care/Home Management : 8-22 mins G-Codes:    Deania Siguenza A Apr 09, 2014, 9:21 AM

## 2014-03-18 NOTE — ED Provider Notes (Signed)
CSN: [id number]     Arrival date & time 03/17/14  2131 History   First MD Initiated Contact with Patient 03/17/14 2357     Chief Complaint  Patient presents with  . Hyperglycemia   HPI  History provided by the patient and recent medical chart. Patient is a 80 year old female with a history of hypertension, hyperlipidemia, diabetes, chronic low back pain with recent surgical laminectomy performed 2 days ago who presented concerns for elevated blood sugar. Patient was discharged yesterday morning after surgical laminectomy of L3-L4 by Dr. Saintclair Halsted. She was generally feeling okay but later during the day after returning home has been having sharp pains radiating down her left hip and thigh. This is similar to her past radicular pains. She also has been feeling very fatigued and slightly confused. She reports checking her blood sugar and having a very elevated reading. She was taking her medications as prescribed. She denies having any cough symptoms. No urinary complaints. No dysuria, hematuria urinary frequency. Does report feeling hot with subjective fever. No other aggravating or alleviating factors. No other associated symptoms.   Past Medical History  Diagnosis Date  . Hypertension   . Hyperlipemia   . Arthritis   . Cancer     skin  . Staph infection     1970's  . Essential and other specified forms of tremor 05/03/2013  . Cholelithiasis   . Chronic insomnia   . Chronic low back pain   . Obesity   . Peripheral edema   . Wrist fracture     left October 2014  . Complication of anesthesia   . PONV (postoperative nausea and vomiting)   . Hx of acute renal failure 02/2014    admitted to [facility]  . DDD (degenerative disc disease), lumbar   . Hx: UTI (urinary tract infection) 02/2014  . Diabetes mellitus     Iddm x 12 years  . Diabetic peripheral neuropathy    Past Surgical History  Procedure Laterality Date  . Abdominal hysterectomy    . Hernia repair    . Total knee arthroplasty     . Cataract extraction    . Oophorectomy    . Neck surgery    . Cardiac catheterization  2000    Dr Einar Gip  . Back surgery  2005  . Eye surgery    . Colonoscopy  >10 years   Family History  Problem Relation Age of Onset  . Cancer Brother     prostate  . Heart disease Brother   . Alzheimer's disease Brother   . Stroke Mother   . Hypertension Mother   . COPD Brother   . Cancer Brother     Prostate cancer  . Hypertension Sister     Congestive heart failure   History  Substance Use Topics  . Smoking status: Never Smoker   . Smokeless tobacco: Never Used  . Alcohol Use: No   OB History   Grav Para Term Preterm Abortions TAB SAB Ect Mult Living                 Review of Systems  Constitutional: Positive for fever and fatigue.  Respiratory: Negative for cough and shortness of breath.   Cardiovascular: Negative for chest pain.  Gastrointestinal: Negative for nausea, vomiting, abdominal pain, diarrhea and constipation.  Genitourinary: Negative for dysuria, frequency, hematuria and flank pain.  Musculoskeletal: Positive for back pain.  Neurological: Negative for headaches.  Psychiatric/Behavioral: Positive for confusion.  All other systems reviewed  and are negative.     Allergies  Codeine; Dilaudid; Ciprofloxacin; and Mysoline  Home Medications   Prior to Admission medications   Medication Sig Start Date End Date Taking? Authorizing Provider  acetaminophen (TYLENOL) 500 MG tablet Take 500 mg by mouth 3 (three) times daily. Takes with gabapentin.   Yes Historical Provider, MD  BAYER CONTOUR NEXT TEST test strip 1 each by Other route 3 (three) times daily. 07/24/13  Yes Historical Provider, MD  chlorthalidone (HYGROTON) 25 MG tablet Take 25 mg by mouth daily.  01/23/14  Yes Historical Provider, MD  cloNIDine (CATAPRES) 0.1 MG tablet Take 0.1 mg by mouth 2 (two) times daily.     Yes Historical Provider, MD  gabapentin (NEURONTIN) 300 MG capsule Take 300 mg by mouth 3  (three) times daily.   Yes Historical Provider, MD  insulin glargine (LANTUS) 100 UNIT/ML injection Inject 65 Units into the skin at bedtime.   Yes Historical Provider, MD  losartan (COZAAR) 100 MG tablet Take 100 mg by mouth daily. 03/01/14  Yes Historical Provider, MD  metFORMIN (GLUCOPHAGE) 1000 MG tablet Take 1,000 mg by mouth daily with breakfast.    Yes Historical Provider, MD  oxyCODONE-acetaminophen (PERCOCET/ROXICET) 5-325 MG per tablet Take 1 tablet by mouth every 6 (six) hours as needed for moderate pain or severe pain.   Yes Historical Provider, MD  pravastatin (PRAVACHOL) 40 MG tablet Take 40 mg by mouth daily.  01/15/14  Yes Historical Provider, MD  propranolol (INDERAL) 10 MG tablet Take 20 mg by mouth 2 (two) times daily.  01/16/14  Yes Dennie Bible, NP  TRADJENTA 5 MG TABS tablet Take 5 mg by mouth daily. 07/24/13  Yes Historical Provider, MD   BP 125/69  Pulse 79  Temp(Src) 98 F (36.7 C) (Oral)  Resp 20  Ht 5\' 9"  (1.753 m)  Wt 239 lb (108.41 kg)  BMI 35.28 kg/m2  SpO2 99% Physical Exam  Nursing note and vitals reviewed. Constitutional: She is oriented to person, place, and time. She appears well-developed and well-nourished. No distress.  HENT:  Head: Normocephalic.  Cardiovascular: Normal rate and regular rhythm.   Pulmonary/Chest: Effort normal and breath sounds normal. No respiratory distress. She has no wheezes. She has no rales.  Abdominal: Soft. There is no tenderness. There is no rebound and no guarding.  Obese  Musculoskeletal: Normal range of motion.  There is a dressing over the lower lumbar back but does have some dry dark blood. No signs of fresh bleeding. There is only very mild tenderness to palpation over the area. No gross deformities.  Neurological: She is alert and oriented to person, place, and time.  Skin: Skin is warm and dry. No rash noted.  Psychiatric: She has a normal mood and affect. Her behavior is normal.    ED Course  Procedures    COORDINATION OF CARE:  Nursing notes reviewed. Vital signs reviewed. Initial pt interview and examination performed.   Filed Vitals:   03/17/14 2244 03/17/14 2310 03/17/14 2327  BP: 129/53  125/69  Pulse: 87  79  Temp: 98 F (36.7 C)    TempSrc: Oral    Resp: 20  20  Height:  5\' 9"  (1.753 m)   Weight:  239 lb (108.41 kg)   SpO2: 95%  99%    12:07 AM-patient seen and evaluated. Patient appears uncomfortable with pain in left lower leg and back. Patient does have rectal temperature of 101.1. Normal heart rate. Stable blood pressures. Clear lungs  normal O2 sats.  Laboratory testing shows slightly elevated WBC. Patient does have hyperglycemia. No anion gap. Normal lactic. UA does show signs concerning for UTI. Patient also with significant drop in hematocrit.  Spoke with Dr. Alcario Drought with [facility]. He will see pt and admit but would like neurosurgery consult due to blood loss.  3:00AM Spoke with Dr. Rita Ohara with neurosurgery. He did participate in surgery on pt and does not feel there is reason from the surgery to explain drop in H/H. He does not recommend the need for any imaging. He does agree with hospitalist admission to stabilize blood sugar, treat UTI and monitor H/H. Does also recommend PT/OT while pt in hospital.    Treatment plan initiated: Medications  sodium chloride 0.9 % bolus 1,000 mL (not administered)  morphine 4 MG/ML injection 4 mg (not administered)  ondansetron (ZOFRAN) injection 4 mg (not administered)    Results for orders placed during the hospital encounter of 03/17/14  CBC      Result Value Ref Range   WBC 12.3 (*) 4.0 - 10.5 K/uL   RBC 2.69 (*) 3.87 - 5.11 MIL/uL   Hemoglobin 7.8 (*) 12.0 - 15.0 g/dL   HCT 23.4 (*) 36.0 - 46.0 %   MCV 87.0  78.0 - 100.0 fL   MCH 29.0  26.0 - 34.0 pg   MCHC 33.3  30.0 - 36.0 g/dL   RDW 13.8  11.5 - 15.5 %   Platelets 255  150 - 400 K/uL  COMPREHENSIVE METABOLIC PANEL      Result Value Ref Range   Sodium 138  137 -  147 mEq/L   Potassium 3.9  3.7 - 5.3 mEq/L   Chloride 101  96 - 112 mEq/L   CO2 25  19 - 32 mEq/L   Glucose, Bld 338 (*) 70 - 99 mg/dL   BUN 29 (*) 6 - 23 mg/dL   Creatinine, Ser 1.40 (*) 0.50 - 1.10 mg/dL   Calcium 8.9  8.4 - 10.5 mg/dL   Total Protein 6.9  6.0 - 8.3 g/dL   Albumin 3.4 (*) 3.5 - 5.2 g/dL   AST 21  0 - 37 U/L   ALT 11  0 - 35 U/L   Alkaline Phosphatase 62  39 - 117 U/L   Total Bilirubin 0.4  0.3 - 1.2 mg/dL   GFR calc non Af Amer 35 (*) >90 mL/min   GFR calc Af Amer 40 (*) >90 mL/min   Anion gap 12  5 - 15  URINALYSIS, ROUTINE W REFLEX MICROSCOPIC      Result Value Ref Range   Color, Urine YELLOW  YELLOW   APPearance CLEAR  CLEAR   Specific Gravity, Urine 1.021  1.005 - 1.030   pH 5.0  5.0 - 8.0   Glucose, UA >1000 (*) NEGATIVE mg/dL   Hgb urine dipstick NEGATIVE  NEGATIVE   Bilirubin Urine NEGATIVE  NEGATIVE   Ketones, ur NEGATIVE  NEGATIVE mg/dL   Protein, ur NEGATIVE  NEGATIVE mg/dL   Urobilinogen, UA 0.2  0.0 - 1.0 mg/dL   Nitrite NEGATIVE  NEGATIVE   Leukocytes, UA TRACE (*) NEGATIVE  URINE MICROSCOPIC-ADD ON      Result Value Ref Range   Squamous Epithelial / LPF FEW (*) RARE   WBC, UA 11-20  <3 WBC/hpf  CBG MONITORING, ED      Result Value Ref Range   Glucose-Capillary 335 (*) 70 - 99 mg/dL   Comment 1 Documented in Chart  Comment 2 Notify RN    I-STAT CG4 LACTIC ACID, ED      Result Value Ref Range   Lactic Acid, Venous 1.67  0.5 - 2.2 mmol/L       Imaging Review Dg Lumbar Spine 2-3 Views  03/16/2014   CLINICAL DATA:  Lumbar laminectomy  EXAM: LUMBAR SPINE - 2-3 VIEW  COMPARISON:  None.  FINDINGS: To cross-table lateral images were obtained. On the first submitted image, There is a metallic probe with its tip posterior to the mid L4 vertebral body level. On the second submitted image, metallic probe tip is posterior to the L3-4 interspace level. There is marked disc space narrowing at L4-5. There is moderate disc space narrowing at L3-4  and L5-S1. No fracture or spondylolisthesis.  IMPRESSION: Areas of osteoarthritic change. Metallic probes as described. Note that on the second submitted image, the metallic probe tip is posterior to the L3-4 level.   Electronically Signed   By: Lowella Grip M.D.   On: 03/16/2014 13:15     MDM   Final diagnoses:  Other specified fever  Hyperglycemia  UTI (lower urinary tract infection)  Anemia, unspecified anemia type        Martie Lee, PA-C 03/18/14 2036

## 2014-03-18 NOTE — Progress Notes (Signed)
Update: repeat of her H/H is substantially different than her initial CBC, her H/H shows a HGB of 10.4.  Believe her initial CBC with HGB of 7.8 is a lab error at this point in time as patient does not appear clinically to have had a 4 gm hemoglobin loss in the past day or so.  Repeat full CBC ordered at 5 am.

## 2014-03-18 NOTE — Progress Notes (Signed)
Patient ID: SOL ODOR, female   DOB: 05/11/1933, 80 y.o.   MRN: [id number] [facility] PROGRESS NOTE  MITHRA SPANO [id number] DOB: 1933/02/10 DOA: 03/17/2014 PCP: Milagros Evener, MD  Brief narrative: Addendum to admission note done 03/18/14 80 y.o. female with history of hypertension, diabetes and recent surgical laminectomy 2 days prior to this admission who presented to [facility] ED 03/17/2014 with high blood sugar. Patient additionally reported fevers at home, feeling fatigued, weak. She was found to have UTI and was started on rocephin.  Assessment/Plan:  Principal Problem:   Hyperglycemia diabetes - check A1c - CBG's in past 12 hours: 205, 197 - continue home insulin regimen  Active Problems:   UTI (lower urinary tract infection) - continue rocephin - follow up urine culture results    Acute renal failure - likely due to UTI, dehydration, prerenal etiology  - continue IV fluids  - follow up BMP in am   HTN (hypertension) - continue home meds    Dyslipidemia - continue statin therapy   DVT prophylaxis: SCD's bilaterally   Code Status: full code  Family Communication: plan of care discussed with the patient Disposition Plan: home when stable   Leisa Lenz, MD  [facility] Pager [phone]  If 7PM-7AM, please contact night-coverage [email or web address] Password [facility] 03/18/2014, 11:56 AM   LOS: 1 day   Consultants:  None   Procedures:  None   Antibiotics:  Rocephin 03/18/2014 -->  HPI/Subjective: No acute overnight events.  Objective: Filed Vitals:   03/18/14 0348 03/18/14 0500 03/18/14 0520 03/18/14 1031  BP: 112/54  124/52 158/55  Pulse: 76  78 84  Temp: 98.6 F (37 C)  99.3 F (37.4 C)   TempSrc: Oral  Oral   Resp: 20  20   Height:  5\' 9"  (1.753 m)    Weight:  111.585 kg (246 lb)    SpO2: 96%  100%     Intake/Output Summary (Last 24 hours) at 03/18/14 1156 Last data filed at 03/18/14 0900  Gross per 24 hour  Intake    360 ml   Output      0 ml  Net    360 ml    Exam:   General:  Pt is alert, follows commands appropriately, not in acute distress  Cardiovascular: Regular rate and rhythm, S1/S2 appreciated   Respiratory: Clear to auscultation bilaterally, no wheezing, no crackles, no rhonchi  Abdomen: Soft, non tender, non distended, bowel sounds present  Extremities: honeycomb patch on her back, saturated; No LE edema, pulses DP and PT palpable bilaterally  Neuro: Grossly nonfocal  Data Reviewed: Basic Metabolic Panel:  Recent Labs Lab 03/14/14 1140 03/17/14 2341  NA 141 138  K 4.0 3.9  CL 101 101  CO2 23 25  GLUCOSE 181* 338*  BUN 21 29*  CREATININE 0.91 1.40*  CALCIUM 9.4 8.9   Liver Function Tests:  Recent Labs Lab 03/17/14 2341  AST 21  ALT 11  ALKPHOS 62  BILITOT 0.4  PROT 6.9  ALBUMIN 3.4*   No results found for this basename: LIPASE, AMYLASE,  in the last 168 hours No results found for this basename: AMMONIA,  in the last 168 hours CBC:  Recent Labs Lab 03/14/14 1140 03/17/14 2341 03/18/14 0304 03/18/14 0625  WBC 7.2 12.3*  --  11.4*  HGB 11.6* 7.8* 10.4* 10.0*  HCT 35.3* 23.4* 31.5* 30.7*  MCV 87.4 87.0  --  87.2  PLT 224 255  --  191   Cardiac Enzymes: No  results found for this basename: CKTOTAL, CKMB, CKMBINDEX, TROPONINI,  in the last 168 hours BNP: No components found with this basename: POCBNP,  CBG:  Recent Labs Lab 03/16/14 2138 03/17/14 0830 03/17/14 2249 03/18/14 0800 03/18/14 1148  GLUCAP 163* 175* 335* 197* 205*    SURGICAL PCR SCREEN     Status: None   Collection Time    03/14/14 11:36 AM      Result Value Ref Range Status   MRSA, PCR NEGATIVE  NEGATIVE Final   Staphylococcus aureus NEGATIVE  NEGATIVE Final     Studies: Dg Chest 2 View 03/18/2014   MPRESSION: No active cardiopulmonary disease.      Scheduled Meds: . acetaminophen  500 mg Oral TID  . cefTRIAXone   1 g Intravenous Q24H  . cloNIDine  0.1 mg Oral BID  .  gabapentin  300 mg Oral TID  . insulin aspart  0-20 Units Subcutaneous TID WC  . insulin glargine  65 Units Subcutaneous QHS  . linagliptin  5 mg Oral Daily  . propranolol  20 mg Oral BID  . simvastatin  20 mg Oral q1800   Continuous Infusions: . sodium chloride 75 mL/hr at 03/18/14 0503

## 2014-03-18 NOTE — H&P (Signed)
[facility] History and Physical  [name] [id number] DOB: Feb 19, 1933 DOA: 03/17/2014  Referring physician: EDP PCP: Milagros Evener, MD   Chief Complaint: Hyperglycemia   HPI: [name] is a 80 y.o. female who just underwent surgical laminectomy 2 days ago, presents for concern for elevated blood sugar.  She was feeling ok when she returned home yesterday, but since arriving home has been having sharp pains radiating down her left hip and thigh similar to past radicular pains.  Also been very fatigued and slightly confused.  Feeling hot with subjective fever.  No aggravating or alleviating factors, no other symptoms.  Review of Systems: Systems reviewed.  As above, otherwise negative  Past Medical History  Diagnosis Date  . Hypertension   . Hyperlipemia   . Arthritis   . Cancer     skin  . Staph infection     1970's  . Essential and other specified forms of tremor 05/03/2013  . Cholelithiasis   . Chronic insomnia   . Chronic low back pain   . Obesity   . Peripheral edema   . Wrist fracture     left October 2014  . Complication of anesthesia   . PONV (postoperative nausea and vomiting)   . Hx of acute renal failure 02/2014    admitted to [facility]  . DDD (degenerative disc disease), lumbar   . Hx: UTI (urinary tract infection) 02/2014  . Diabetes mellitus     Iddm x 12 years  . Diabetic peripheral neuropathy    Past Surgical History  Procedure Laterality Date  . Abdominal hysterectomy    . Hernia repair    . Total knee arthroplasty    . Cataract extraction    . Oophorectomy    . Neck surgery    . Cardiac catheterization  2000    Dr Einar Gip  . Back surgery  2005  . Eye surgery    . Colonoscopy  >10 years   Social History:  reports that she has never smoked. She has never used smokeless tobacco. She reports that she does not drink alcohol or use illicit drugs.  Allergies  Allergen Reactions  . Codeine Nausea And Vomiting  . Dilaudid  [Hydromorphone Hcl] Nausea And Vomiting and Other (See Comments)    Severe sweating  . Ciprofloxacin     Acute kidney failure  . Mysoline [Primidone]     Nausea    Family History  Problem Relation Age of Onset  . Cancer Brother     prostate  . Heart disease Brother   . Alzheimer's disease Brother   . Stroke Mother   . Hypertension Mother   . COPD Brother   . Cancer Brother     Prostate cancer  . Hypertension Sister     Congestive heart failure     Prior to Admission medications   Medication Sig Start Date End Date Taking? Authorizing Provider  acetaminophen (TYLENOL) 500 MG tablet Take 500 mg by mouth 3 (three) times daily. Takes with gabapentin.   Yes Historical Provider, MD  BAYER CONTOUR NEXT TEST test strip 1 each by Other route 3 (three) times daily. 07/24/13  Yes Historical Provider, MD  chlorthalidone (HYGROTON) 25 MG tablet Take 25 mg by mouth daily.  01/23/14  Yes Historical Provider, MD  cloNIDine (CATAPRES) 0.1 MG tablet Take 0.1 mg by mouth 2 (two) times daily.     Yes Historical Provider, MD  gabapentin (NEURONTIN) 300 MG capsule Take 300 mg by mouth 3 (  three) times daily.   Yes Historical Provider, MD  insulin glargine (LANTUS) 100 UNIT/ML injection Inject 65 Units into the skin at bedtime.   Yes Historical Provider, MD  losartan (COZAAR) 100 MG tablet Take 100 mg by mouth daily. 03/01/14  Yes Historical Provider, MD  metFORMIN (GLUCOPHAGE) 1000 MG tablet Take 1,000 mg by mouth daily with breakfast.    Yes Historical Provider, MD  oxyCODONE-acetaminophen (PERCOCET/ROXICET) 5-325 MG per tablet Take 1 tablet by mouth every 6 (six) hours as needed for moderate pain or severe pain.   Yes Historical Provider, MD  pravastatin (PRAVACHOL) 40 MG tablet Take 40 mg by mouth daily.  01/15/14  Yes Historical Provider, MD  propranolol (INDERAL) 10 MG tablet Take 20 mg by mouth 2 (two) times daily.  01/16/14  Yes Dennie Bible, NP  TRADJENTA 5 MG TABS tablet Take 5 mg by mouth  daily. 07/24/13  Yes Historical Provider, MD   Physical Exam: Filed Vitals:   03/18/14 0013  BP:   Pulse:   Temp: 101.1 F (38.4 C)  Resp:     BP 125/69  Pulse 79  Temp(Src) 101.1 F (38.4 C) (Rectal)  Resp 20  Ht 5\' 9"  (1.753 m)  Wt 108.41 kg (239 lb)  BMI 35.28 kg/m2  SpO2 99%  General Appearance:    Alert, oriented, no distress, appears stated age  Head:    Normocephalic, atraumatic  Eyes:    PERRL, EOMI, sclera non-icteric        Nose:   Nares without drainage or epistaxis. Mucosa, turbinates normal  Throat:   Moist mucous membranes. Oropharynx without erythema or exudate.  Neck:   Supple. No carotid bruits.  No thyromegaly.  No lymphadenopathy.   Back:     Surgical wound under dressing, dressing is saturated with sanguinous drainage.  Lungs:     Clear to auscultation bilaterally, without wheezes, rhonchi or rales  Chest wall:    No tenderness to palpitation  Heart:    Regular rate and rhythm without murmurs, gallops, rubs  Abdomen:     Soft, non-tender, nondistended, normal bowel sounds, no organomegaly  Genitalia:    deferred  Rectal:    deferred  Extremities:   No clubbing, cyanosis or edema.  Pulses:   2+ and symmetric all extremities  Skin:   Skin color, texture, turgor normal, no rashes or lesions  Lymph nodes:   Cervical, supraclavicular, and axillary nodes normal  Neurologic:   CNII-XII intact. Normal strength, sensation and reflexes      throughout    Labs on Admission:  Basic Metabolic Panel:  Recent Labs Lab 03/14/14 1140 03/17/14 2341  NA 141 138  K 4.0 3.9  CL 101 101  CO2 23 25  GLUCOSE 181* 338*  BUN 21 29*  CREATININE 0.91 1.40*  CALCIUM 9.4 8.9   Liver Function Tests:  Recent Labs Lab 03/17/14 2341  AST 21  ALT 11  ALKPHOS 62  BILITOT 0.4  PROT 6.9  ALBUMIN 3.4*   No results found for this basename: LIPASE, AMYLASE,  in the last 168 hours No results found for this basename: AMMONIA,  in the last 168 hours CBC:  Recent  Labs Lab 03/14/14 1140 03/17/14 2341  WBC 7.2 12.3*  HGB 11.6* 7.8*  HCT 35.3* 23.4*  MCV 87.4 87.0  PLT 224 255   Cardiac Enzymes: No results found for this basename: CKTOTAL, CKMB, CKMBINDEX, TROPONINI,  in the last 168 hours  BNP (last 3 results) No results  found for this basename: PROBNP,  in the last 8760 hours CBG:  Recent Labs Lab 03/16/14 1239 03/16/14 1714 03/16/14 2138 03/17/14 0830 03/17/14 2249  GLUCAP 135* 150* 163* 175* 335*    Radiological Exams on Admission: Dg Chest 2 View  03/18/2014   CLINICAL DATA:  Hyperglycemia, recent spine surgery  EXAM: CHEST  2 VIEW  COMPARISON:  03/14/2014  FINDINGS: The heart size and mediastinal contours are within normal limits. Both lungs are clear. The visualized skeletal structures are unremarkable. Cervical fusion hardware partly visualized. Lungs are hypoaerated with crowding of the bronchovascular markings.  IMPRESSION: No active cardiopulmonary disease.   Electronically Signed   By: Conchita Paris M.D.   On: 03/18/2014 01:06   Dg Lumbar Spine 2-3 Views  03/16/2014   CLINICAL DATA:  Lumbar laminectomy  EXAM: LUMBAR SPINE - 2-3 VIEW  COMPARISON:  None.  FINDINGS: To cross-table lateral images were obtained. On the first submitted image, There is a metallic probe with its tip posterior to the mid L4 vertebral body level. On the second submitted image, metallic probe tip is posterior to the L3-4 interspace level. There is marked disc space narrowing at L4-5. There is moderate disc space narrowing at L3-4 and L5-S1. No fracture or spondylolisthesis.  IMPRESSION: Areas of osteoarthritic change. Metallic probes as described. Note that on the second submitted image, the metallic probe tip is posterior to the L3-4 level.   Electronically Signed   By: Lowella Grip M.D.   On: 03/16/2014 13:15    EKG: Independently reviewed.  Assessment/Plan Active Problems:   Acute renal failure   HTN (hypertension)   Diabetes mellitus   Acute  blood loss anemia   UTI (lower urinary tract infection)   1. UTI - treating with rocephin daily, monitor WBC. 2. Acute blood loss anemia - very concerned given the unusually large hemoglobin drop from 11.6 prior to surgery on 7/8 to 7.8 today.  Dr. Sherwood Gambler (who was on call this evening) said he was actually in on the case assisting Dr. Saintclair Halsted with the surgery and stated that there was nothing during surgery that could explain this kind of blood loss.  He did not have any further specific recommendations at this time.  Rechecking H/H to make sure that the 7.8 is not a lab error, if it is not, then next I would check CT non-contrast of her surgical site to look for evidence of hematoma.  Patient has no symptoms of GI bleed at all and no other obvious sites where this sort of major rapid blood loss could be hiding. 3. DM 2- continue home lantus (which she didn't take yet tonight), tradjenda, putting patient on high dose SSI AC/HS. 4. HTN - continue home meds but holding nephrotoxic losartan and chlorthalidone in setting of AKI. 5. AKI - pre-renal due to either UTI and/or blood loss.    Code Status: Full Code  Family Communication: Family at bedside Disposition Plan: Admit to inpatient   Time spent: 70 min  Zimri Brennen M. [facility] Pager [phone]  If 7AM-7PM, please contact the day team taking care of the patient [email or web address] Password [facility] 03/18/2014, 3:19 AM

## 2014-03-18 NOTE — Progress Notes (Signed)
PT Cancellation Note  Patient Details Name: INDRA WOLTERS MRN: [id number] DOB: [birth year]   Cancelled Treatment:    Reason Eval/Treat Not Completed: Other (comment) (pt declined to participate at this time. Requests to have her muscle relaxer before OOB. will check back as schedule permits. )   Weston Anna, MPT Pager: [phone]

## 2014-03-18 NOTE — Evaluation (Signed)
Physical Therapy Evaluation Patient Details Name: [name] MRN: [id number] DOB: 05/11/1933 Today's Date: 03/18/2014   History of Present Illness  80 y.o. female who just underwent surgical laminectomy 03/16/14, presents for concern for elevated blood sugar, sharp pains radiating down her left hip and thigh similar to past radicular pains, fatigue,slight confusion  Clinical Impression  On eval, pt required Min assist for mobility-able to ambulate ~120 feet with walker. Pt reported 9/10 pain in L LE with activity that was relieved slightly with ambulation. Encouraged pt to ambulate with nursing assistance at least one more time today before returning to bed.     Follow Up Recommendations Home health PT;Supervision - Intermittent    Equipment Recommendations  None recommended by PT    Recommendations for Other Services OT consult     Precautions / Restrictions Precautions Precautions: Back;Fall Precaution Comments: reviewed back precautions and logroll technique. Pt able to recall 2/3 back precautions only.  Restrictions Weight Bearing Restrictions: No      Mobility  Bed Mobility               General bed mobility comments: pt sitting in recliner  Transfers Overall transfer level: Needs assistance Equipment used: Rolling walker (2 wheeled) Transfers: Sit to/from Stand Sit to Stand: Min assist         General transfer comment: Assist to rise, stabilize, control descent. VCs safety, technique, hand placement, posture  Ambulation/Gait Ambulation/Gait assistance: Min guard Ambulation Distance (Feet): 120 Feet Assistive device: Rolling walker (2 wheeled) Gait Pattern/deviations: Step-through pattern;Antalgic     General Gait Details: close guard for safety. Pt reportedf 9/10 L LE pain with activity. slow gait speed. Pt did report that LE did feel a little better towards end of ambulation distance.   Stairs            Wheelchair Mobility    Modified  [place] (Stroke Patients Only)       Balance           Standing balance support: Bilateral upper extremity supported;During functional activity Standing balance-Leahy Scale: Poor                               Pertinent Vitals/Pain 4/10 at rest; 9/10 with activity L LE. Pt was medicated prior to session.     Home Living                        Prior Function                 Hand Dominance        Extremity/Trunk Assessment   Upper Extremity Assessment: Defer to OT evaluation           Lower Extremity Assessment: RLE deficits/detail;LLE deficits/detail RLE Deficits / Details: WFL    Cervical / Trunk Assessment: Normal  Communication      Cognition Arousal/Alertness: Awake/alert Behavior During Therapy: WFL for tasks assessed/performed Overall Cognitive Status: Within Functional Limits for tasks assessed                      General Comments      Exercises        Assessment/Plan    PT Assessment Patient needs continued PT services  PT Diagnosis Difficulty walking;Abnormality of gait;Acute pain;Generalized weakness   PT Problem List Decreased strength;Decreased activity tolerance;Decreased balance;Decreased mobility;Obesity;Decreased knowledge of precautions;Decreased knowledge of use of DME;Pain  PT Treatment Interventions DME instruction;Gait training;Functional mobility training;Therapeutic activities;Therapeutic exercise;Patient/family education;Balance training   PT Goals (Current goals can be found in the Care Plan section) Acute Rehab PT Goals Patient Stated Goal: home today with daughter and granddaughter's assist PT Goal Formulation: With patient/family Time For Goal Achievement: 04/01/14 Potential to Achieve Goals: Good    Frequency Min 4X/week   Barriers to discharge        Co-evaluation               End of Session   Activity Tolerance: Patient limited by pain;Patient limited by  fatigue Patient left: in chair;with call bell/phone within reach;with family/visitor present           Time: 7510-2585 PT Time Calculation (min): 20 min   Charges:   PT Evaluation $Initial PT Evaluation Tier I: 1 Procedure PT Treatments $Gait Training: 8-22 mins   PT G Codes:          Weston Anna, MPT Pager: [phone]

## 2014-03-19 ENCOUNTER — Encounter (HOSPITAL_COMMUNITY): Payer: Self-pay | Admitting: Neurosurgery

## 2014-03-19 DIAGNOSIS — E785 Hyperlipidemia, unspecified: Secondary | ICD-10-CM

## 2014-03-19 DIAGNOSIS — N390 Urinary tract infection, site not specified: Secondary | ICD-10-CM | POA: Diagnosis not present

## 2014-03-19 LAB — URINE CULTURE
Colony Count: NO GROWTH
Culture: NO GROWTH

## 2014-03-19 LAB — GLUCOSE, CAPILLARY
Glucose-Capillary: 153 mg/dL — ABNORMAL HIGH (ref 70–99)
Glucose-Capillary: 204 mg/dL — ABNORMAL HIGH (ref 70–99)

## 2014-03-19 MED ORDER — OXYCODONE-ACETAMINOPHEN 5-325 MG PO TABS: 1.0000 | ORAL_TABLET | Freq: Four times a day (QID) | ORAL | Status: DC | PRN

## 2014-03-19 MED ORDER — OXYCODONE-ACETAMINOPHEN 5-325 MG PO TABS
2.0000 | ORAL_TABLET | Freq: Four times a day (QID) | ORAL | Status: DC | PRN
  Administered 2014-03-19: 2 via ORAL
  Filled 2014-03-19: qty 2

## 2014-03-19 MED ORDER — NITROFURANTOIN MONOHYD MACRO 100 MG PO CAPS
100.0000 mg | ORAL_CAPSULE | Freq: Two times a day (BID) | ORAL | Status: DC
Start: 2014-03-19 — End: 2014-03-21

## 2014-03-19 MED ORDER — METHOCARBAMOL 500 MG PO TABS: 500.0000 mg | ORAL_TABLET | Freq: Four times a day (QID) | ORAL | Status: DC | PRN

## 2014-03-19 MED ORDER — OXYCODONE-ACETAMINOPHEN 5-325 MG PO TABS
2.0000 | ORAL_TABLET | Freq: Four times a day (QID) | ORAL | Status: DC | PRN
Start: 2014-03-19 — End: 2014-03-21

## 2014-03-19 NOTE — Progress Notes (Signed)
Patient was given discharge instructions. Patient stated that she understood all instructions.patient stable for discharge.

## 2014-03-19 NOTE — Progress Notes (Signed)
[facility]  Patient Status: New  [facility] is providing the following services: PT and OT.  [facility] received the referral for [facility] PT and OT on Saturday 7/11 from Geri Seminole, RN CM but was unable to start care due to the patient being re-admitted to [facility] on the same day of d/c from [facility]. We will follow for further [facility] needs.   If patient discharges after hours, please call [phone].   Lurlean Leyden 03/19/2014, 9:55 AM

## 2014-03-19 NOTE — Discharge Instructions (Signed)

## 2014-03-19 NOTE — Progress Notes (Signed)
Occupational Therapy Treatment Patient Details Name: [name] MRN: [id number] DOB: 1933/01/14 Today's Date: 03/19/2014    History of present illness 80 y.o. female who just underwent surgical laminectomy 03/16/14, presents for concern for elevated blood sugar, sharp pains radiating down her left hip and thigh similar to past radicular pains, fatigue,slight confusion   OT comments    Follow Up Recommendations  Home health OT;Supervision/Assistance - 24 hour    Equipment Recommendations  None recommended by OT       Precautions / Restrictions Precautions Precautions: Back;Fall Precaution Comments: reviewed back precautions and logroll technique. Pt able to recall 2/3 back precautions only.  Restrictions Weight Bearing Restrictions: No       Mobility Bed Mobility            pt was already sitting on EOB      Transfers Overall transfer level: Needs assistance (BSC to bed and bed to chair) Equipment used: Rolling walker (2 wheeled) Transfers: Sit to/from Stand Sit to Stand: Min assist         General transfer comment: Assist to rise, stabilize, control descent. VCs safety, technique, hand placement, posture        ADL               Lower Body Bathing: Minimal assistance;Sit to/from stand   Upper Body Dressing : Set up;Sitting   Lower Body Dressing: Sit to/from stand;With adaptive equipment   Toilet Transfer: Regular Toilet;Grab bars;Minimal assistance   Toileting- Clothing Manipulation and Hygiene: Sit to/from stand;Cueing for back precautions;Minimal assistance         General ADL Comments: Reinterated back precautions and need for walker.  Pt needed VC to recall back precautions and had been getting up with out walker. Explained with pain in L knee using walker much safer.  Pt agreed.  Rn reports MD calling Dr Vida Roller   Behavior During Therapy: [facility] for tasks assessed/performed Overall Cognitive Status: Within  Functional Limits for tasks assessed                               General Comments  L knee pain very limiting to patients I and mobility    Pertinent Vitals/ Pain       L knee pain very limiting            Progress Toward Goals  OT Goals(current goals can now be found in the care plan section)  Progress towards OT goals: Progressing toward goals     Plan Discharge plan remains appropriate       End of Session     Activity Tolerance Patient limited by pain   Patient Left in chair;with call bell/phone within reach   Nurse Communication Mobility status        Time: 1044-1130 OT Time Calculation (min): 46 min  Charges: OT General Charges $OT Visit: 1 Procedure OT Treatments $Self Care/Home Management : 38-52 mins  [place], Thereasa Parkin 03/19/2014, 11:55 AM

## 2014-03-19 NOTE — ED Provider Notes (Signed)
Medical screening examination/treatment/procedure(s) were conducted as a shared visit with non-physician practitioner(s) and myself.  I personally evaluated the patient during the encounter.   EKG Interpretation None     Pt s/p laminectomy, now with elevated blood sugars.  No pain at surgery site, no purulent drainage. Some return in pain of radiculopathy that she had pre op Has had some soaked dressings.  No weakness, numbness, dizziness.  Initial cbc with surprising drop in H/H from preop, but pt without signs of acute blood loss anemia.  Noted to have UTI.  Plan for admission to hospitalist, recheck h/h (suspect lab error).  NS[facility] aware of pt's admission, no further recommendations from their service.  Kalman Drape, MD 03/19/14 [phone]

## 2014-03-19 NOTE — Progress Notes (Signed)
Nutrition Brief Note  Patient identified on the Malnutrition Screening Tool (MST) Report  Wt Readings from Last 15 Encounters:  03/18/14 246 lb (111.585 kg)  03/14/14 239 lb 11.2 oz (108.727 kg)  02/13/14 236 lb 1.8 oz (107.1 kg)  02/07/14 230 lb (104.327 kg)  01/16/14 232 lb (105.235 kg)  08/23/13 243 lb (110.224 kg)  05/03/13 242 lb (109.77 kg)  10/23/11 245 lb 3.2 oz (111.222 kg)  10/19/11 243 lb (110.224 kg)  10/01/11 243 lb 9.6 oz (110.496 kg)  04/24/11 250 lb (113.399 kg)    Body mass index is 36.31 kg/(m^2). Patient meets criteria for Obesity II based on current BMI.   Current diet order is Carb Modified, patient is consuming approximately 100% of meals at this time. Labs and medications reviewed.   Pt denied any changes in appetite or weight pta. Reviewed current diet order and carbohydrate restrictions. Provided pt with nutrition education materials from the [employer] for further review of carbohydrate counting. Pt w/out additional nutrition-related questions/concerns  at this time. Encourage pt to contact RD as needed  No nutrition interventions warranted at this time. If nutrition issues arise, please consult RD.   Atlee Abide MS RD LDN Clinical Dietitian [phone]

## 2014-03-19 NOTE — Care Management Note (Signed)
    Page 1 of 1   03/19/2014     11:16:20 AM CARE MANAGEMENT NOTE 03/19/2014  Patient:  [name], [name]   Account Number:  [id number]  Date Initiated:  03/19/2014  Documentation initiated by:  Sunday Spillers  Subjective/Objective Assessment:   80 yo female admitted with hyperglycemia, UTI. PTA lived at home alone.     Action/Plan:   Home when stable   Anticipated DC Date:  03/22/2014   Anticipated DC Plan:  [place]  CM consult      [facility] Choice  HOME HEALTH  Resumption Of Svcs/PTA Provider   Choice offered to / List presented to:          [facility] arranged  HH-2 PT  HH-3 OT  [place] RN      [place].   Status of service:  Completed, signed off Medicare Important Message given?   (If response is "NO", the following Medicare IM given date fields will be blank) Date Medicare IM given:   Medicare IM given by:   Date Additional Medicare IM given:   Additional Medicare IM given by:    Discharge Disposition:  [place]  Per UR Regulation:  Reviewed for med. necessity/level of care/duration of stay  If discussed at [place] of Stay Meetings, dates discussed:    Comments:  Patient active with [facility] from previous admission.

## 2014-03-19 NOTE — Discharge Summary (Signed)
Physician Discharge Summary  [name] WHQ:[id number] DOB: 24-Sep-1932 DOA: 03/17/2014  PCP: Milagros Evener, MD  Admit date: 03/17/2014 Discharge date: 03/19/2014  Recommendations for Outpatient Follow-up:  1. Check CBC and BMP during next appt with your PCP.  2. Renal function to be rechecked by PCP and medications to be adjusted by PCP if needed. She has been on losartan, metformin and chlorthalidone and it seems that her kidney function is much better on this admission. We will continue the same medications on discharge. 3. I spoke with Dr. Joya Salm, his office will scheduled follow up appt for pt  Discharge Diagnoses:  Principal Problem:   Hyperglycemia Active Problems:   UTI (lower urinary tract infection)   Acute renal failure   HTN (hypertension)   Diabetes mellitus   Dyslipidemia    Discharge Condition: stable   Diet recommendation: as tolerated   History of present illness:  80 y.o. female with history of hypertension, diabetes and recent surgical laminectomy 2 days prior to this admission who presented to [facility] ED 03/17/2014 with high blood sugar. Patient additionally reported fevers at home, feeling fatigued, weak. She was found to have UTI and was started on rocephin.   Assessment/Plan:   Principal Problem:  Hyperglycemia diabetes  - A1c on this admission 7.1 indicating good glycemic control. Please continue current home insulin regimen as well as metformin and tradjenta  Active Problems:  UTI (lower urinary tract infection)  - Has received Rocephin in hospital but will go home with Macrobid for 3 more days on discharge. Urine culture was not collected at the time of admission. Acute on chronic kidney disease, stage III - Noted that patient had creatinine as high as 4.26 in June 2015. This time, creatinine is much better, 1.4 - Patient received IV fluids throughout the hospital stay. HTN (hypertension)  - continue home meds  Dyslipidemia  - continue statin  therapy    DVT prophylaxis: SCD's bilaterally    Code Status: full code  Family Communication: plan of care discussed with the patient   Consultants:  None  Procedures:  None  Antibiotics:  Rocephin 03/18/2014 --> 03/19/2014 Macrobid for 3 more days on discharge    Signed:  Leisa Lenz, MD  [facility] 03/19/2014, 12:49 PM  Pager #: [phone]   Discharge Exam: Filed Vitals:   03/19/14 0651  BP: 131/59  Pulse: 84  Temp: 97.6 F (36.4 C)  Resp: 16   Filed Vitals:   03/18/14 1031 03/18/14 1510 03/18/14 2040 03/19/14 0651  BP: 158/55 117/61 168/65 131/59  Pulse: 84 82 73 84  Temp:  98.4 F (36.9 C) 98.1 F (36.7 C) 97.6 F (36.4 C)  TempSrc:  Oral Oral Oral  Resp:  18 16 16   Height:      Weight:      SpO2:  99% 99% 97%    General: Pt is alert, follows commands appropriately, not in acute distress Cardiovascular: Regular rate and rhythm, S1/S2 +, no murmurs Respiratory: Clear to auscultation bilaterally, no wheezing, no crackles, no rhonchi Abdominal: Soft, non tender, non distended, bowel sounds +, no guarding Extremities: no cyanosis, pulses palpable bilaterally DP and PT Neuro: Grossly nonfocal  Discharge Instructions  Discharge Instructions   Call MD for:  difficulty breathing, headache or visual disturbances    Complete by:  As directed      Call MD for:  persistant dizziness or light-headedness    Complete by:  As directed      Call MD for:  persistant  nausea and vomiting    Complete by:  As directed      Call MD for:  severe uncontrolled pain    Complete by:  As directed      Diet - low sodium heart healthy    Complete by:  As directed      Discharge instructions    Complete by:  As directed   1. Continue current medications. Dr. Harley Hallmark office will call you to schedule appt for you to see him after laminectomy.     Increase activity slowly    Complete by:  As directed             Medication List         acetaminophen 500  MG tablet  Commonly known as:  TYLENOL  Take 500 mg by mouth 3 (three) times daily. Takes with gabapentin.     BAYER CONTOUR NEXT TEST test strip  Generic drug:  glucose blood  1 each by Other route 3 (three) times daily.     chlorthalidone 25 MG tablet  Commonly known as:  HYGROTON  Take 25 mg by mouth daily.     cloNIDine 0.1 MG tablet  Commonly known as:  CATAPRES  Take 0.1 mg by mouth 2 (two) times daily.     gabapentin 300 MG capsule  Commonly known as:  NEURONTIN  Take 300 mg by mouth 3 (three) times daily.     insulin glargine 100 UNIT/ML injection  Commonly known as:  LANTUS  Inject 65 Units into the skin at bedtime.     losartan 100 MG tablet  Commonly known as:  COZAAR  Take 100 mg by mouth daily.     metFORMIN 1000 MG tablet  Commonly known as:  GLUCOPHAGE  Take 1,000 mg by mouth daily with breakfast.     methocarbamol 500 MG tablet  Commonly known as:  ROBAXIN  Take 1 tablet (500 mg total) by mouth every 6 (six) hours as needed for muscle spasms.     nitrofurantoin (macrocrystal-monohydrate) 100 MG capsule  Commonly known as:  MACROBID  Take 1 capsule (100 mg total) by mouth 2 (two) times daily.     oxyCODONE-acetaminophen 5-325 MG per tablet  Commonly known as:  PERCOCET/ROXICET  Take 2 tablets by mouth every 6 (six) hours as needed for moderate pain or severe pain.     pravastatin 40 MG tablet  Commonly known as:  PRAVACHOL  Take 40 mg by mouth daily.     propranolol 10 MG tablet  Commonly known as:  INDERAL  Take 20 mg by mouth 2 (two) times daily.     TRADJENTA 5 MG Tabs tablet  Generic drug:  linagliptin  Take 5 mg by mouth daily.           Follow-up Information   Follow up with [name], MD.   Specialty:  Neurosurgery   Contact information:   [place] [address] [phone]       Schedule an appointment as soon as possible for a visit with Milagros Evener, MD. (Follow up appt after recent  hospitalization)    Specialty:  Family Medicine   Contact information:   [address] [place] [address] [phone]        The results of significant diagnostics from this hospitalization (including imaging, microbiology, ancillary and laboratory) are listed below for reference.    Significant Diagnostic Studies: Dg Chest 2 View 03/18/2014   CLINICAL DATA:  Hyperglycemia, recent spine surgery  EXAM: CHEST  2 VIEW  COMPARISON:  03/14/2014  FINDINGS: The heart size and mediastinal contours are within normal limits. Both lungs are clear. The visualized skeletal structures are unremarkable. Cervical fusion hardware partly visualized. Lungs are hypoaerated with crowding of the bronchovascular markings.  IMPRESSION: No active cardiopulmonary disease.   Electronically Signed   By: Conchita Paris M.D.   On: 03/18/2014 01:06   Dg Chest 2 View 03/14/2014   CLINICAL DATA:  Hypertension.  EXAM: CHEST  2 VIEW  COMPARISON:  April 26, 2011.  FINDINGS: The heart size and mediastinal contours are within normal limits. Both lungs are clear. The visualized skeletal structures are unremarkable.  IMPRESSION: No acute cardiopulmonary abnormality seen.   Electronically Signed   By: Sabino Dick M.D.   On: 03/14/2014 13:55   Dg Lumbar Spine 2-3 Views 03/16/2014   CLINICAL DATA:  Lumbar laminectomy  EXAM: LUMBAR SPINE - 2-3 VIEW  COMPARISON:  None.  FINDINGS: To cross-table lateral images were obtained. On the first submitted image, There is a metallic probe with its tip posterior to the mid L4 vertebral body level. On the second submitted image, metallic probe tip is posterior to the L3-4 interspace level. There is marked disc space narrowing at L4-5. There is moderate disc space narrowing at L3-4 and L5-S1. No fracture or spondylolisthesis.  IMPRESSION: Areas of osteoarthritic change. Metallic probes as described. Note that on the second submitted image, the metallic probe tip is posterior to the L3-4 level.    Electronically Signed   By: Lowella Grip M.D.   On: 03/16/2014 13:15   Ct Lumbar Spine W Contrast 03/06/2014   CLINICAL DATA:  Low back and left lower extremity pain. Radicular symptoms within an S1 dermatome. Recent fall.  EXAM: LUMBAR MYELOGRAM  FLUOROSCOPY TIME:  1 min and 5 seconds  PROCEDURE: After thorough discussion of risks and benefits of the procedure including bleeding, infection, injury to nerves, blood vessels, adjacent structures as well as headache and CSF leak, written and oral informed consent was obtained. Consent was obtained by Dr. Lawrence Santiago. Time out form was completed.  Patient was positioned prone on the fluoroscopy table. Local anesthesia was provided with 1% lidocaine without epinephrine after prepped and draped in the usual sterile fashion. After review of prior imaging, the puncture was performed at L1-2 using a 3 1/2 inch 22-gauge spinal needle via a right paramedian approach. Using a single pass through the dura, the needle was placed within the thecal sac, with return of clear CSF. 17 mL of Omnipaque-180 was injected into the thecal sac, with normal opacification of the nerve roots and cauda equina consistent with free flow within the subarachnoid space.  I personally performed the lumbar puncture and administered the intrathecal contrast. I also personally supervised acquisition of the myelogram images.  TECHNIQUE: Contiguous axial images were obtained through the Lumbar spine after the intrathecal infusion of infusion. Coronal and sagittal reconstructions were obtained of the axial image sets.  COMPARISON:  MRI of the lumbar spine 02/07/2014.  FINDINGS: LUMBAR MYELOGRAM FINDINGS:  The upper nerve roots fill normally. Five non rib-bearing lumbar type vertebral bodies are present. Vertebral body heights and alignment are is normal. Moderate central canal stenosis is present at L3-4 with lateral recess narrowing bilaterally. There is mild lateral recess narrowing on the right  at L4-5. The S1 nerve roots fill on both sides.  The upright images demonstrate no significant change in the central canal narrowing at  L3-4, predominantly due to posterior element disease. There is no abnormal motion with flexion or extension. A limited range of motion is demonstrated.  CT LUMBAR MYELOGRAM FINDINGS:  The lumbar spine is imaged from the upper aspect T12 through S3-4. Vertebral body heights and AP alignment are normal. The conus medullaris terminates at L1, within normal limits. Rightward curvature of the lumbar spine is centered at L2. Leftward curvature is centered at L5. Asymmetric right-sided endplate changes and vacuum disc are noted at L4-5.  Minimal atherosclerotic changes are present in the aorta without aneurysm.  L1-2: Moderate facet hypertrophy is present. There is no significant stenosis.  L2-3: Moderate facet hypertrophy is worse on the right. There is no significant central disc protrusion or stenosis.  L3-4: Advanced facet hypertrophy is present. Moderate ligamentum flavum thickening is noted. This leads to mild lateral recess narrowing bilaterally. Facet spurring contributes to mild to moderate foraminal stenosis, worse on the left.  L4-5: The patient is status post right laminectomy. Residual or recurrent osseous right lateral recess narrowing is present, potentially affecting the right L5 nerve root. Facet spurring and endplate degenerative changes result in moderate right and mild left foraminal stenosis.  L5-S1: Moderate facet arthropathy is present bilaterally with spurring. Mild endplate spurring is present on the right. This results in moderate right foraminal and mild right lateral recess narrowing.  IMPRESSION: LUMBAR MYELOGRAM IMPRESSION:  1. Moderate central canal stenosis at L3-4 without significant exacerbation with standing. 2. No abnormal motion with flexion or extension. 3. Mild lateral recess narrowing or postsurgical change on the right at L4-5.  CT LUMBAR MYELOGRAM  IMPRESSION:  1. Multilevel facet hypertrophy throughout the lumbar spine. 2. Ligamentum flavum thickening and a mild broad-based disc protrusion at L3-4 along with advanced facet hypertrophy results in mild to moderate foraminal stenosis, worse on the left. 3. Postoperative changes of right laminectomy at L4-5 with residual recurrent osseous right lateral recess narrowing potentially affecting the right L5 nerve root. 4. Moderate right and mild left foraminal stenosis at L4-5 secondary to endplate change and facet spurring. 5. Moderate facet arthropathy and endplate spurring at H3-Z1 with moderate right foraminal and mild right lateral recess narrowing.   Electronically Signed   By: Lawrence Santiago M.D.   On: 03/06/2014 13:32   Dg Myelography Lumbar Inj Lumbosacral 03/06/2014   CLINICAL DATA:  Low back and left lower extremity pain. Radicular symptoms within an S1 dermatome. Recent fall.  EXAM: LUMBAR MYELOGRAM  FLUOROSCOPY TIME:  1 min and 5 seconds  PROCEDURE: After thorough discussion of risks and benefits of the procedure including bleeding, infection, injury to nerves, blood vessels, adjacent structures as well as headache and CSF leak, written and oral informed consent was obtained. Consent was obtained by Dr. Lawrence Santiago. Time out form was completed.  Patient was positioned prone on the fluoroscopy table. Local anesthesia was provided with 1% lidocaine without epinephrine after prepped and draped in the usual sterile fashion. After review of prior imaging, the puncture was performed at L1-2 using a 3 1/2 inch 22-gauge spinal needle via a right paramedian approach. Using a single pass through the dura, the needle was placed within the thecal sac, with return of clear CSF. 17 mL of Omnipaque-180 was injected into the thecal sac, with normal opacification of the nerve roots and cauda equina consistent with free flow within the subarachnoid space.  I personally performed the lumbar puncture and administered  the intrathecal contrast. I also personally supervised acquisition of the myelogram images.  TECHNIQUE: Contiguous axial images were obtained through the Lumbar spine after the intrathecal infusion of infusion. Coronal and sagittal reconstructions were obtained of the axial image sets.  COMPARISON:  MRI of the lumbar spine 02/07/2014.  FINDINGS: LUMBAR MYELOGRAM FINDINGS:  The upper nerve roots fill normally. Five non rib-bearing lumbar type vertebral bodies are present. Vertebral body heights and alignment are is normal. Moderate central canal stenosis is present at L3-4 with lateral recess narrowing bilaterally. There is mild lateral recess narrowing on the right at L4-5. The S1 nerve roots fill on both sides.  The upright images demonstrate no significant change in the central canal narrowing at L3-4, predominantly due to posterior element disease. There is no abnormal motion with flexion or extension. A limited range of motion is demonstrated.  CT LUMBAR MYELOGRAM FINDINGS:  The lumbar spine is imaged from the upper aspect T12 through S3-4. Vertebral body heights and AP alignment are normal. The conus medullaris terminates at L1, within normal limits. Rightward curvature of the lumbar spine is centered at L2. Leftward curvature is centered at L5. Asymmetric right-sided endplate changes and vacuum disc are noted at L4-5.  Minimal atherosclerotic changes are present in the aorta without aneurysm.  L1-2: Moderate facet hypertrophy is present. There is no significant stenosis.  L2-3: Moderate facet hypertrophy is worse on the right. There is no significant central disc protrusion or stenosis.  L3-4: Advanced facet hypertrophy is present. Moderate ligamentum flavum thickening is noted. This leads to mild lateral recess narrowing bilaterally. Facet spurring contributes to mild to moderate foraminal stenosis, worse on the left.  L4-5: The patient is status post right laminectomy. Residual or recurrent osseous right  lateral recess narrowing is present, potentially affecting the right L5 nerve root. Facet spurring and endplate degenerative changes result in moderate right and mild left foraminal stenosis.  L5-S1: Moderate facet arthropathy is present bilaterally with spurring. Mild endplate spurring is present on the right. This results in moderate right foraminal and mild right lateral recess narrowing.  IMPRESSION: LUMBAR MYELOGRAM IMPRESSION:  1. Moderate central canal stenosis at L3-4 without significant exacerbation with standing. 2. No abnormal motion with flexion or extension. 3. Mild lateral recess narrowing or postsurgical change on the right at L4-5.  CT LUMBAR MYELOGRAM IMPRESSION:  1. Multilevel facet hypertrophy throughout the lumbar spine. 2. Ligamentum flavum thickening and a mild broad-based disc protrusion at L3-4 along with advanced facet hypertrophy results in mild to moderate foraminal stenosis, worse on the left. 3. Postoperative changes of right laminectomy at L4-5 with residual recurrent osseous right lateral recess narrowing potentially affecting the right L5 nerve root. 4. Moderate right and mild left foraminal stenosis at L4-5 secondary to endplate change and facet spurring. 5. Moderate facet arthropathy and endplate spurring at J0-Z0 with moderate right foraminal and mild right lateral recess narrowing.   Electronically Signed   By: Lawrence Santiago M.D.   On: 03/06/2014 13:32    Microbiology: SURGICAL PCR SCREEN     Status: None   Collection Time    03/14/14 11:36 AM      Result Value Ref Range Status   MRSA, PCR NEGATIVE  NEGATIVE Final   Staphylococcus aureus NEGATIVE  NEGATIVE Final     Labs: Basic Metabolic Panel:  Recent Labs Lab 03/14/14 1140 03/17/14 2341  NA 141 138  K 4.0 3.9  CL 101 101  CO2 23 25  GLUCOSE 181* 338*  BUN 21 29*  CREATININE 0.91 1.40*  CALCIUM 9.4 8.9   Liver  Function Tests:  Recent Labs Lab 03/17/14 2341  AST 21  ALT 11  ALKPHOS 62  BILITOT  0.4  PROT 6.9  ALBUMIN 3.4*   No results found for this basename: LIPASE, AMYLASE,  in the last 168 hours No results found for this basename: AMMONIA,  in the last 168 hours CBC:  Recent Labs Lab 03/14/14 1140 03/17/14 2341 03/18/14 0304 03/18/14 0625  WBC 7.2 12.3*  --  11.4*  HGB 11.6* 7.8* 10.4* 10.0*  HCT 35.3* 23.4* 31.5* 30.7*  MCV 87.4 87.0  --  87.2  PLT 224 255  --  191   Cardiac Enzymes: No results found for this basename: CKTOTAL, CKMB, CKMBINDEX, TROPONINI,  in the last 168 hours BNP: BNP (last 3 results) No results found for this basename: PROBNP,  in the last 8760 hours CBG:  Recent Labs Lab 03/18/14 1148 03/18/14 1700 03/18/14 2343 03/19/14 0827 03/19/14 1146  GLUCAP 205* 186* 219* 153* 204*    Time coordinating discharge: Over 30 minutes

## 2014-03-20 ENCOUNTER — Other Ambulatory Visit: Payer: Self-pay | Admitting: Neurosurgery

## 2014-03-20 ENCOUNTER — Observation Stay (HOSPITAL_COMMUNITY)
Admission: AD | Admit: 2014-03-20 | Discharge: 2014-03-23 | Disposition: A | Discharge status: Home / Self Care | Payer: Medicare Other | Source: Ambulatory Visit | Attending: Neurosurgery | Admitting: Neurosurgery

## 2014-03-20 DIAGNOSIS — M1710 Unilateral primary osteoarthritis, unspecified knee: Secondary | ICD-10-CM | POA: Insufficient documentation

## 2014-03-20 DIAGNOSIS — Z96659 Presence of unspecified artificial knee joint: Secondary | ICD-10-CM | POA: Insufficient documentation

## 2014-03-20 DIAGNOSIS — I10 Essential (primary) hypertension: Secondary | ICD-10-CM | POA: Insufficient documentation

## 2014-03-20 DIAGNOSIS — E669 Obesity, unspecified: Secondary | ICD-10-CM | POA: Diagnosis not present

## 2014-03-20 DIAGNOSIS — E1142 Type 2 diabetes mellitus with diabetic polyneuropathy: Secondary | ICD-10-CM | POA: Diagnosis not present

## 2014-03-20 DIAGNOSIS — R609 Edema, unspecified: Secondary | ICD-10-CM | POA: Insufficient documentation

## 2014-03-20 DIAGNOSIS — E785 Hyperlipidemia, unspecified: Secondary | ICD-10-CM | POA: Diagnosis not present

## 2014-03-20 DIAGNOSIS — C4490 Unspecified malignant neoplasm of skin, unspecified: Secondary | ICD-10-CM | POA: Insufficient documentation

## 2014-03-20 DIAGNOSIS — E1149 Type 2 diabetes mellitus with other diabetic neurological complication: Secondary | ICD-10-CM | POA: Diagnosis not present

## 2014-03-20 DIAGNOSIS — Z794 Long term (current) use of insulin: Secondary | ICD-10-CM | POA: Diagnosis not present

## 2014-03-20 DIAGNOSIS — Z9181 History of falling: Secondary | ICD-10-CM | POA: Insufficient documentation

## 2014-03-20 DIAGNOSIS — G4700 Insomnia, unspecified: Secondary | ICD-10-CM | POA: Diagnosis not present

## 2014-03-20 DIAGNOSIS — Z9889 Other specified postprocedural states: Secondary | ICD-10-CM | POA: Insufficient documentation

## 2014-03-20 DIAGNOSIS — M5416 Radiculopathy, lumbar region: Secondary | ICD-10-CM | POA: Diagnosis present

## 2014-03-20 DIAGNOSIS — IMO0002 Reserved for concepts with insufficient information to code with codable children: Secondary | ICD-10-CM | POA: Diagnosis present

## 2014-03-20 LAB — CBC WITH DIFFERENTIAL/PLATELET
Basophils Absolute: 0 10*3/uL (ref 0.0–0.1)
Basophils Relative: 0 % (ref 0–1)
Eosinophils Absolute: 0.3 10*3/uL (ref 0.0–0.7)
Eosinophils Relative: 5 % (ref 0–5)
HCT: 30.1 % — ABNORMAL LOW (ref 36.0–46.0)
Hemoglobin: 9.8 g/dL — ABNORMAL LOW (ref 12.0–15.0)
Lymphocytes Relative: 28 % (ref 12–46)
Lymphs Abs: 1.9 10*3/uL (ref 0.7–4.0)
MCH: 28.4 pg (ref 26.0–34.0)
MCHC: 32.6 g/dL (ref 30.0–36.0)
MCV: 87.2 fL (ref 78.0–100.0)
Monocytes Absolute: 0.6 10*3/uL (ref 0.1–1.0)
Monocytes Relative: 9 % (ref 3–12)
Neutro Abs: 4.1 10*3/uL (ref 1.7–7.7)
Neutrophils Relative %: 58 % (ref 43–77)
Platelets: 217 10*3/uL (ref 150–400)
RBC: 3.45 MIL/uL — ABNORMAL LOW (ref 3.87–5.11)
RDW: 13.6 % (ref 11.5–15.5)
WBC: 6.9 10*3/uL (ref 4.0–10.5)

## 2014-03-20 LAB — GLUCOSE, CAPILLARY: Glucose-Capillary: 158 mg/dL — ABNORMAL HIGH (ref 70–99)

## 2014-03-20 MED ORDER — LINAGLIPTIN 5 MG PO TABS
5.0000 mg | ORAL_TABLET | Freq: Every day | ORAL | Status: DC
  Administered 2014-03-21 – 2014-03-23 (×3): 5 mg via ORAL
  Filled 2014-03-20 (×3): qty 1

## 2014-03-20 MED ORDER — CLONIDINE HCL 0.1 MG PO TABS
0.1000 mg | ORAL_TABLET | Freq: Two times a day (BID) | ORAL | Status: DC
  Administered 2014-03-20 – 2014-03-23 (×6): 0.1 mg via ORAL
  Filled 2014-03-20 (×7): qty 1

## 2014-03-20 MED ORDER — SODIUM CHLORIDE 0.9 % IJ SOLN: 3.0000 mL | INTRAMUSCULAR | Status: DC | PRN

## 2014-03-20 MED ORDER — SIMVASTATIN 40 MG PO TABS
40.0000 mg | ORAL_TABLET | Freq: Every day | ORAL | Status: DC
  Administered 2014-03-21 – 2014-03-22 (×2): 40 mg via ORAL
  Filled 2014-03-20 (×2): qty 1

## 2014-03-20 MED ORDER — LOSARTAN POTASSIUM 50 MG PO TABS
100.0000 mg | ORAL_TABLET | Freq: Every day | ORAL | Status: DC
  Administered 2014-03-21 – 2014-03-23 (×3): 100 mg via ORAL
  Filled 2014-03-20 (×3): qty 2

## 2014-03-20 MED ORDER — METFORMIN HCL 500 MG PO TABS
1000.0000 mg | ORAL_TABLET | Freq: Every day | ORAL | Status: DC
  Administered 2014-03-22 – 2014-03-23 (×2): 1000 mg via ORAL
  Filled 2014-03-20 (×2): qty 2

## 2014-03-20 MED ORDER — SODIUM CHLORIDE 0.9 % IV SOLN
INTRAVENOUS | Status: DC
  Administered 2014-03-20: 1000 mL via INTRAVENOUS

## 2014-03-20 MED ORDER — SODIUM CHLORIDE 0.9 % IJ SOLN
3.0000 mL | Freq: Two times a day (BID) | INTRAMUSCULAR | Status: DC
  Administered 2014-03-21 – 2014-03-23 (×3): 3 mL via INTRAVENOUS

## 2014-03-20 MED ORDER — CHLORTHALIDONE 25 MG PO TABS
25.0000 mg | ORAL_TABLET | Freq: Every day | ORAL | Status: DC
  Administered 2014-03-21 – 2014-03-23 (×3): 25 mg via ORAL
  Filled 2014-03-20 (×3): qty 1

## 2014-03-20 MED ORDER — SENNA 8.6 MG PO TABS
1.0000 | ORAL_TABLET | Freq: Two times a day (BID) | ORAL | Status: DC
  Administered 2014-03-20 – 2014-03-23 (×5): 8.6 mg via ORAL
  Filled 2014-03-20 (×4): qty 1

## 2014-03-20 MED ORDER — OXYCODONE-ACETAMINOPHEN 5-325 MG PO TABS
1.0000 | ORAL_TABLET | ORAL | Status: DC | PRN
  Administered 2014-03-20 – 2014-03-21 (×3): 2 via ORAL
  Filled 2014-03-20: qty 1
  Filled 2014-03-20 (×3): qty 2

## 2014-03-20 MED ORDER — INSULIN ASPART 100 UNIT/ML ~~LOC~~ SOLN: 0.0000 [IU] | Freq: Every day | SUBCUTANEOUS | Status: DC

## 2014-03-20 MED ORDER — ACETAMINOPHEN 650 MG RE SUPP: 650.0000 mg | RECTAL | Status: DC | PRN

## 2014-03-20 MED ORDER — GABAPENTIN 300 MG PO CAPS
300.0000 mg | ORAL_CAPSULE | Freq: Three times a day (TID) | ORAL | Status: DC
  Administered 2014-03-20 – 2014-03-23 (×8): 300 mg via ORAL
  Filled 2014-03-20 (×8): qty 1

## 2014-03-20 MED ORDER — PHENOL 1.4 % MT LIQD: 1.0000 | OROMUCOSAL | Status: DC | PRN

## 2014-03-20 MED ORDER — NITROFURANTOIN MONOHYD MACRO 100 MG PO CAPS
100.0000 mg | ORAL_CAPSULE | Freq: Two times a day (BID) | ORAL | Status: DC
  Administered 2014-03-20 – 2014-03-23 (×6): 100 mg via ORAL
  Filled 2014-03-20 (×9): qty 1

## 2014-03-20 MED ORDER — ONDANSETRON HCL 4 MG/2ML IJ SOLN: 4.0000 mg | INTRAMUSCULAR | Status: DC | PRN

## 2014-03-20 MED ORDER — ACETAMINOPHEN 325 MG PO TABS: 650.0000 mg | ORAL_TABLET | ORAL | Status: DC | PRN

## 2014-03-20 MED ORDER — DIAZEPAM 5 MG PO TABS
5.0000 mg | ORAL_TABLET | Freq: Four times a day (QID) | ORAL | Status: DC | PRN
  Administered 2014-03-21: 5 mg via ORAL
  Filled 2014-03-20: qty 1

## 2014-03-20 MED ORDER — INSULIN ASPART 100 UNIT/ML ~~LOC~~ SOLN
0.0000 [IU] | Freq: Three times a day (TID) | SUBCUTANEOUS | Status: DC
  Administered 2014-03-21: 3 [IU] via SUBCUTANEOUS
  Administered 2014-03-21 – 2014-03-22 (×3): 4 [IU] via SUBCUTANEOUS
  Administered 2014-03-22: 7 [IU] via SUBCUTANEOUS
  Administered 2014-03-22: 3 [IU] via SUBCUTANEOUS
  Administered 2014-03-23 (×2): 4 [IU] via SUBCUTANEOUS

## 2014-03-20 MED ORDER — PROPRANOLOL HCL 20 MG PO TABS
20.0000 mg | ORAL_TABLET | Freq: Two times a day (BID) | ORAL | Status: DC
  Administered 2014-03-20 – 2014-03-23 (×6): 20 mg via ORAL
  Filled 2014-03-20 (×9): qty 1

## 2014-03-20 MED ORDER — MORPHINE SULFATE 2 MG/ML IJ SOLN: 1.0000 mg | INTRAMUSCULAR | Status: DC | PRN

## 2014-03-20 MED ORDER — MENTHOL 3 MG MT LOZG: 1.0000 | LOZENGE | OROMUCOSAL | Status: DC | PRN

## 2014-03-20 MED ORDER — LOSARTAN POTASSIUM 50 MG PO TABS: 100.0000 mg | ORAL_TABLET | Freq: Every day | ORAL | Status: DC

## 2014-03-20 MED ORDER — SODIUM CHLORIDE 0.9 % IV SOLN: 250.0000 mL | INTRAVENOUS | Status: DC

## 2014-03-20 NOTE — H&P (Signed)
[name] is an 80 y.o. female.   Chief Complaint: left leg pain HPI: patient who on 03/16/14 had a left l3-4 laminotomies aand foraminotomies. She was discharge the following morning. That night she developed pain in the left leg and [facility]. She came today to my office in a wheel chair. While sitting the pain was better but standing it got worse  Past Medical History  Diagnosis Date  . Hypertension   . Hyperlipemia   . Arthritis   . Cancer     skin  . Staph infection     1970's  . Essential and other specified forms of tremor 05/03/2013  . Cholelithiasis   . Chronic insomnia   . Chronic low back pain   . Obesity   . Peripheral edema   . Wrist fracture     left October 2014  . Complication of anesthesia   . PONV (postoperative nausea and vomiting)   . Hx of acute renal failure 02/2014    admitted to [facility]  . DDD (degenerative disc disease), lumbar   . Hx: UTI (urinary tract infection) 02/2014  . Diabetes mellitus     Iddm x 12 years  . Diabetic peripheral neuropathy     Past Surgical History  Procedure Laterality Date  . Abdominal hysterectomy    . Hernia repair    . Total knee arthroplasty    . Cataract extraction    . Oophorectomy    . Neck surgery    . Cardiac catheterization  2000    Dr Einar Gip  . Back surgery  2005  . Eye surgery    . Colonoscopy  >10 years  . Lumbar laminectomy/decompression microdiscectomy Left 03/16/2014    Procedure: LUMBAR LAMINECTOMY/DECOMPRESSION MICRODISCECTOMY 1 LEVEL  lumbar level three/four;  Surgeon: [name], MD;  Location: [facility];  Service: Neurosurgery;  Laterality: Left;    Family History  Problem Relation Age of Onset  . Cancer Brother     prostate  . Heart disease Brother   . Alzheimer's disease Brother   . Stroke Mother   . Hypertension Mother   . COPD Brother   . Cancer Brother     Prostate cancer  . Hypertension Sister     Congestive heart failure   Social  History:  reports that she has never smoked. She has never used smokeless tobacco. She reports that she does not drink alcohol or use illicit drugs.  Allergies:  Allergies  Allergen Reactions  . Codeine Nausea And Vomiting  . Dilaudid [Hydromorphone Hcl] Nausea And Vomiting and Other (See Comments)    Severe sweating  . Ciprofloxacin     Acute kidney failure  . Mysoline [Primidone]     Nausea    Medications Prior to Admission  Medication Sig Dispense Refill  . acetaminophen (TYLENOL) 500 MG tablet Take 500 mg by mouth 3 (three) times daily. Takes with gabapentin.      [name] BAYER CONTOUR NEXT TEST test strip 1 each by Other route 3 (three) times daily.      . chlorthalidone (HYGROTON) 25 MG tablet Take 25 mg by mouth daily.       . cloNIDine (CATAPRES) 0.1 MG tablet Take 0.1 mg by mouth 2 (two) times daily.        [name] gabapentin (NEURONTIN) 300 MG capsule Take 300 mg by mouth 3 (three) times daily.      . insulin glargine (LANTUS) 100 UNIT/ML  injection Inject 65 Units into the skin at bedtime.      [name] losartan (COZAAR) 100 MG tablet Take 100 mg by mouth daily.      . metFORMIN (GLUCOPHAGE) 1000 MG tablet Take 1,000 mg by mouth daily with breakfast.       . methocarbamol (ROBAXIN) 500 MG tablet Take 1 tablet (500 mg total) by mouth every 6 (six) hours as needed for muscle spasms.  30 tablet  0  . nitrofurantoin, macrocrystal-monohydrate, (MACROBID) 100 MG capsule Take 1 capsule (100 mg total) by mouth 2 (two) times daily.  6 capsule  0  . oxyCODONE-acetaminophen (PERCOCET/ROXICET) 5-325 MG per tablet Take 2 tablets by mouth every 6 (six) hours as needed for moderate pain or severe pain.  45 tablet  0  . pravastatin (PRAVACHOL) 40 MG tablet Take 40 mg by mouth daily.       . propranolol (INDERAL) 10 MG tablet Take 20 mg by mouth 2 (two) times daily.       . TRADJENTA 5 MG TABS tablet Take 5 mg by mouth daily.        Results for orders placed during the hospital encounter of 03/17/14 (from the  past 48 hour(s))  GLUCOSE, CAPILLARY     Status: Abnormal   Collection Time    03/18/14 11:43 PM      Result Value Ref Range   Glucose-Capillary 219 (*) 70 - 99 mg/dL   Comment 1 Notify RN    GLUCOSE, CAPILLARY     Status: Abnormal   Collection Time    03/19/14  8:27 AM      Result Value Ref Range   Glucose-Capillary 153 (*) 70 - 99 mg/dL  GLUCOSE, CAPILLARY     Status: Abnormal   Collection Time    03/19/14 11:46 AM      Result Value Ref Range   Glucose-Capillary 204 (*) 70 - 99 mg/dL   Comment 1 Documented in Chart     Comment 2 Notify RN     No results found.  Review of Systems  Constitutional: Negative.   HENT: Negative.   Eyes: Negative.   Respiratory: Negative.   Cardiovascular: Negative.   Gastrointestinal: Negative.   Genitourinary: Negative.   Musculoskeletal: Positive for back pain and joint pain.  Skin: Negative.   Neurological: Negative.   Endo/Heme/Allergies: Negative.   Psychiatric/Behavioral: Negative.     Blood pressure 142/61, pulse 70, temperature 98.9 F (37.2 C), temperature source Oral, resp. rate 18, SpO2 99.00%. Physical Exam hent, nl. Neck, anterior scar. Cv, nl. Lungs, clear. Abdo,en soft. Extremities, scar in left knee. Tenderness and swelling in the left knee. Unable to stand because of pai. Lumbar wound shows some serous drainage. No weakness in the lower extremities  Assessment/Plan Pain control. To find the source of the pain  [name] M 03/20/2014, 9:06 PM

## 2014-03-21 ENCOUNTER — Inpatient Hospital Stay (HOSPITAL_COMMUNITY): Payer: Medicare Other

## 2014-03-21 DIAGNOSIS — IMO0002 Reserved for concepts with insufficient information to code with codable children: Secondary | ICD-10-CM | POA: Diagnosis not present

## 2014-03-21 LAB — BASIC METABOLIC PANEL
Anion gap: 11 (ref 5–15)
BUN: 18 mg/dL (ref 6–23)
CO2: 26 mEq/L (ref 19–32)
Calcium: 8.8 mg/dL (ref 8.4–10.5)
Chloride: 101 mEq/L (ref 96–112)
Creatinine, Ser: 0.81 mg/dL (ref 0.50–1.10)
GFR calc Af Amer: 78 mL/min — ABNORMAL LOW (ref >90)
GFR calc non Af Amer: 67 mL/min — ABNORMAL LOW (ref >90)
Glucose, Bld: 200 mg/dL — ABNORMAL HIGH (ref 70–99)
Potassium: 5.3 mEq/L (ref 3.7–5.3)
Sodium: 138 mEq/L (ref 137–147)

## 2014-03-21 LAB — GLUCOSE, CAPILLARY
Glucose-Capillary: 137 mg/dL — ABNORMAL HIGH (ref 70–99)
Glucose-Capillary: 152 mg/dL — ABNORMAL HIGH (ref 70–99)
Glucose-Capillary: 151 mg/dL — ABNORMAL HIGH (ref 70–99)
Glucose-Capillary: 154 mg/dL — ABNORMAL HIGH (ref 70–99)

## 2014-03-21 MED ORDER — ALPRAZOLAM 0.5 MG PO TABS
1.5000 mg | ORAL_TABLET | Freq: Once | ORAL | Status: AC | PRN
  Administered 2014-03-21: 1.5 mg via ORAL
  Filled 2014-03-21: qty 3

## 2014-03-21 MED ORDER — POLYETHYLENE GLYCOL 3350 17 G PO PACK
17.0000 g | PACK | Freq: Every day | ORAL | Status: DC
  Administered 2014-03-21: 17 g via ORAL
  Filled 2014-03-21 (×2): qty 1

## 2014-03-21 MED ORDER — MAGNESIUM CITRATE PO SOLN
1.0000 | Freq: Once | ORAL | Status: AC
  Administered 2014-03-21: 1 via ORAL
  Filled 2014-03-21: qty 296

## 2014-03-21 NOTE — Progress Notes (Signed)
Pt was brought down after being sedated with PO meds . Pt was also given a laxative prior to coming to MRI. Upon arrival to MRI suite pt requested to go to lavatory. Pt could not control bowels enough to tolerate exam. Per pt's request exam is on hold till thur. Meds for sedation will need to be reordered.Lynelle Doctor RT,R,MR

## 2014-03-21 NOTE — Progress Notes (Signed)
CARE MANAGEMENT NOTE 03/21/2014  Patient:  [name], [name]   Account Number:  [id number]  Date Initiated:  03/21/2014  Documentation initiated by:  Olga Coaster  Subjective/Objective Assessment:   ADMITTED WITH BACK PAIN, RECENT BACK SURGERY     Action/Plan:   CM FOLLOWING FOR DCP/ PATIENT IS ACTIVE WITH [facility] AS PRIOR TO ADMISSION   Anticipated DC Date:  03/28/2014   Anticipated DC Plan:  AWAITING FOR PT/OT EVALS FOR DISPOSITION NEEDS     DC Planning Services  CM consult          Status of service:  In process, will continue to follow Medicare Important Message given?   (If response is "NO", the following Medicare IM given date fields will be blank)  Per UR Regulation:  Reviewed for med. necessity/level of care/duration of stay  Comments:  7/15/2015Mindi Slicker RN,BSN,MHA [phone]

## 2014-03-21 NOTE — Clinical Social Work Note (Signed)
CSW consulted for possible SNF placement at time of discharge. Per chart review, PT has recommended pt to be discharged home with home health services. CSW signing off. Thank you for the referral.  Lubertha Sayres, MSW, [facility] Licensed Clinical Social Worker [phone] and [phone]

## 2014-03-21 NOTE — Consult Note (Signed)
NAME: [name] MRN:   [id number] DOB:   21-Oct-1932     HISTORY AND PHYSICAL  CHIEF COMPLAINT:  Left knee pain  HISTORY:   [name] a 80 y.o. female who on 03/16/14 had a left l3-4 laminotomies aand foraminotomies. She was discharge the following morning. That night she developed pain in the left leg and [facility]. She came today to my office in a wheel chair. While sitting the pain was better but standing it got worse. Dr Ronnie Derby was called for ortho consult.   PAST MEDICAL HISTORY:   Past Medical History  Diagnosis Date  . Hypertension   . Hyperlipemia   . Arthritis   . Cancer     skin  . Staph infection     1970's  . Essential and other specified forms of tremor 05/03/2013  . Cholelithiasis   . Chronic insomnia   . Chronic low back pain   . Obesity   . Peripheral edema   . Wrist fracture     left October 2014  . Complication of anesthesia   . PONV (postoperative nausea and vomiting)   . Hx of acute renal failure 02/2014    admitted to [facility]  . DDD (degenerative disc disease), lumbar   . Hx: UTI (urinary tract infection) 02/2014  . Diabetes mellitus     Iddm x 12 years  . Diabetic peripheral neuropathy     PAST SURGICAL HISTORY:   Past Surgical History  Procedure Laterality Date  . Abdominal hysterectomy    . Hernia repair    . Total knee arthroplasty    . Cataract extraction    . Oophorectomy    . Neck surgery    . Cardiac catheterization  2000    Dr Einar Gip  . Back surgery  2005  . Eye surgery    . Colonoscopy  >10 years  . Lumbar laminectomy/decompression microdiscectomy Left 03/16/2014    Procedure: LUMBAR LAMINECTOMY/DECOMPRESSION MICRODISCECTOMY 1 LEVEL  lumbar level three/four;  Surgeon: [name], MD;  Location: [facility];  Service: Neurosurgery;  Laterality: Left;    MEDICATIONS:   Medications Prior to Admission  Medication Sig Dispense Refill  . acetaminophen (TYLENOL) 500 MG tablet Take 500 mg  by mouth 3 (three) times daily. Takes with gabapentin.      . chlorthalidone (HYGROTON) 25 MG tablet Take 25 mg by mouth daily.       . cloNIDine (CATAPRES) 0.1 MG tablet Take 0.1 mg by mouth 2 (two) times daily.        [name] gabapentin (NEURONTIN) 300 MG capsule Take 300 mg by mouth 3 (three) times daily.      . insulin glargine (LANTUS) 100 UNIT/ML injection Inject 65 Units into the skin at bedtime.      [name] losartan (COZAAR) 100 MG tablet Take 100 mg by mouth daily.      . metFORMIN (GLUCOPHAGE) 1000 MG tablet Take 1,000 mg by mouth daily with breakfast.       . methocarbamol (ROBAXIN) 500 MG tablet Take 500 mg by mouth every 6 (six) hours as needed for muscle spasms.      . nitrofurantoin, macrocrystal-monohydrate, (MACROBID) 100 MG capsule Take 100 mg by mouth 2 (two) times daily.      [name] oxyCODONE-acetaminophen (PERCOCET/ROXICET) 5-325 MG per tablet Take 2 tablets by mouth every 6 (six) hours as needed for severe pain.      . pravastatin (PRAVACHOL)  40 MG tablet Take 40 mg by mouth at bedtime.       . propranolol (INDERAL) 10 MG tablet Take 20 mg by mouth 2 (two) times daily.       . TRADJENTA 5 MG TABS tablet Take 5 mg by mouth daily.        ALLERGIES:   Allergies  Allergen Reactions  . Codeine Nausea And Vomiting  . Dilaudid [Hydromorphone Hcl] Nausea And Vomiting and Other (See Comments)    Severe sweating  . Ciprofloxacin     Acute kidney failure  . Mysoline [Primidone]     Nausea    REVIEW OF SYSTEMS:   Negative except HPI  FAMILY HISTORY:   Family History  Problem Relation Age of Onset  . Cancer Brother     prostate  . Heart disease Brother   . Alzheimer's disease Brother   . Stroke Mother   . Hypertension Mother   . COPD Brother   . Cancer Brother     Prostate cancer  . Hypertension Sister     Congestive heart failure    SOCIAL HISTORY:   reports that she has never smoked. She has never used smokeless tobacco. She reports that she does not drink alcohol or use  illicit drugs.  PHYSICAL EXAM:  General appearance: alert, cooperative and no distress Resp: clear to auscultation bilaterally Cardio: regular rate and rhythm, S1, S2 normal, no murmur, click, rub or gallop GI: soft, non-tender; bowel sounds normal; no masses,  no organomegaly Extremities: extremities normal, atraumatic, no cyanosis or edema and Homans sign is negative, no sign of DVT Pulses: 2+ and symmetric Skin: Skin color, texture, turgor normal. No rashes or lesions Neurologic: Alert and oriented X 3, normal strength and tone. Normal symmetric reflexes. Normal coordination and gait    LABORATORY STUDIES:  Recent Labs  03/20/14 2245  WBC 6.9  HGB 9.8*  HCT 30.1*  PLT 217     Recent Labs  03/21/14 1355  NA 138  K 5.3  CL 101  CO2 26  GLUCOSE 200*  BUN 18  CREATININE 0.81  CALCIUM 8.8    STUDIES/RESULTS:  Dg Chest 2 View  03/18/2014   CLINICAL DATA:  Hyperglycemia, recent spine surgery  EXAM: CHEST  2 VIEW  COMPARISON:  03/14/2014  FINDINGS: The heart size and mediastinal contours are within normal limits. Both lungs are clear. The visualized skeletal structures are unremarkable. Cervical fusion hardware partly visualized. Lungs are hypoaerated with crowding of the bronchovascular markings.  IMPRESSION: No active cardiopulmonary disease.   Electronically Signed   By: Conchita Paris M.D.   On: 03/18/2014 01:06   Dg Chest 2 View  03/14/2014   CLINICAL DATA:  Hypertension.  EXAM: CHEST  2 VIEW  COMPARISON:  April 26, 2011.  FINDINGS: The heart size and mediastinal contours are within normal limits. Both lungs are clear. The visualized skeletal structures are unremarkable.  IMPRESSION: No acute cardiopulmonary abnormality seen.   Electronically Signed   By: Sabino Dick M.D.   On: 03/14/2014 13:55   Dg Lumbar Spine 2-3 Views  03/16/2014   CLINICAL DATA:  Lumbar laminectomy  EXAM: LUMBAR SPINE - 2-3 VIEW  COMPARISON:  None.  FINDINGS: To cross-table lateral images were  obtained. On the first submitted image, There is a metallic probe with its tip posterior to the mid L4 vertebral body level. On the second submitted image, metallic probe tip is posterior to the L3-4 interspace level. There is marked disc space narrowing  at L4-5. There is moderate disc space narrowing at L3-4 and L5-S1. No fracture or spondylolisthesis.  IMPRESSION: Areas of osteoarthritic change. Metallic probes as described. Note that on the second submitted image, the metallic probe tip is posterior to the L3-4 level.   Electronically Signed   By: Lowella Grip M.D.   On: 03/16/2014 13:15   Ct Lumbar Spine W Contrast  03/06/2014   CLINICAL DATA:  Low back and left lower extremity pain. Radicular symptoms within an S1 dermatome. Recent fall.  EXAM: LUMBAR MYELOGRAM  FLUOROSCOPY TIME:  1 min and 5 seconds  PROCEDURE: After thorough discussion of risks and benefits of the procedure including bleeding, infection, injury to nerves, blood vessels, adjacent structures as well as headache and CSF leak, written and oral informed consent was obtained. Consent was obtained by Dr. Lawrence Santiago. Time out form was completed.  Patient was positioned prone on the fluoroscopy table. Local anesthesia was provided with 1% lidocaine without epinephrine after prepped and draped in the usual sterile fashion. After review of prior imaging, the puncture was performed at L1-2 using a 3 1/2 inch 22-gauge spinal needle via a right paramedian approach. Using a single pass through the dura, the needle was placed within the thecal sac, with return of clear CSF. 17 mL of Omnipaque-180 was injected into the thecal sac, with normal opacification of the nerve roots and cauda equina consistent with free flow within the subarachnoid space.  I personally performed the lumbar puncture and administered the intrathecal contrast. I also personally supervised acquisition of the myelogram images.  TECHNIQUE: Contiguous axial images were obtained  through the Lumbar spine after the intrathecal infusion of infusion. Coronal and sagittal reconstructions were obtained of the axial image sets.  COMPARISON:  MRI of the lumbar spine 02/07/2014.  FINDINGS: LUMBAR MYELOGRAM FINDINGS:  The upper nerve roots fill normally. Five non rib-bearing lumbar type vertebral bodies are present. Vertebral body heights and alignment are is normal. Moderate central canal stenosis is present at L3-4 with lateral recess narrowing bilaterally. There is mild lateral recess narrowing on the right at L4-5. The S1 nerve roots fill on both sides.  The upright images demonstrate no significant change in the central canal narrowing at L3-4, predominantly due to posterior element disease. There is no abnormal motion with flexion or extension. A limited range of motion is demonstrated.  CT LUMBAR MYELOGRAM FINDINGS:  The lumbar spine is imaged from the upper aspect T12 through S3-4. Vertebral body heights and AP alignment are normal. The conus medullaris terminates at L1, within normal limits. Rightward curvature of the lumbar spine is centered at L2. Leftward curvature is centered at L5. Asymmetric right-sided endplate changes and vacuum disc are noted at L4-5.  Minimal atherosclerotic changes are present in the aorta without aneurysm.  L1-2: Moderate facet hypertrophy is present. There is no significant stenosis.  L2-3: Moderate facet hypertrophy is worse on the right. There is no significant central disc protrusion or stenosis.  L3-4: Advanced facet hypertrophy is present. Moderate ligamentum flavum thickening is noted. This leads to mild lateral recess narrowing bilaterally. Facet spurring contributes to mild to moderate foraminal stenosis, worse on the left.  L4-5: The patient is status post right laminectomy. Residual or recurrent osseous right lateral recess narrowing is present, potentially affecting the right L5 nerve root. Facet spurring and endplate degenerative changes result in  moderate right and mild left foraminal stenosis.  L5-S1: Moderate facet arthropathy is present bilaterally with spurring. Mild endplate spurring is present  on the right. This results in moderate right foraminal and mild right lateral recess narrowing.  IMPRESSION: LUMBAR MYELOGRAM IMPRESSION:  1. Moderate central canal stenosis at L3-4 without significant exacerbation with standing. 2. No abnormal motion with flexion or extension. 3. Mild lateral recess narrowing or postsurgical change on the right at L4-5.  CT LUMBAR MYELOGRAM IMPRESSION:  1. Multilevel facet hypertrophy throughout the lumbar spine. 2. Ligamentum flavum thickening and a mild broad-based disc protrusion at L3-4 along with advanced facet hypertrophy results in mild to moderate foraminal stenosis, worse on the left. 3. Postoperative changes of right laminectomy at L4-5 with residual recurrent osseous right lateral recess narrowing potentially affecting the right L5 nerve root. 4. Moderate right and mild left foraminal stenosis at L4-5 secondary to endplate change and facet spurring. 5. Moderate facet arthropathy and endplate spurring at W0-J8 with moderate right foraminal and mild right lateral recess narrowing.   Electronically Signed   By: Lawrence Santiago M.D.   On: 03/06/2014 13:32   Dg Myelography Lumbar Inj Lumbosacral  03/06/2014   CLINICAL DATA:  Low back and left lower extremity pain. Radicular symptoms within an S1 dermatome. Recent fall.  EXAM: LUMBAR MYELOGRAM  FLUOROSCOPY TIME:  1 min and 5 seconds  PROCEDURE: After thorough discussion of risks and benefits of the procedure including bleeding, infection, injury to nerves, blood vessels, adjacent structures as well as headache and CSF leak, written and oral informed consent was obtained. Consent was obtained by Dr. Lawrence Santiago. Time out form was completed.  Patient was positioned prone on the fluoroscopy table. Local anesthesia was provided with 1% lidocaine without epinephrine after  prepped and draped in the usual sterile fashion. After review of prior imaging, the puncture was performed at L1-2 using a 3 1/2 inch 22-gauge spinal needle via a right paramedian approach. Using a single pass through the dura, the needle was placed within the thecal sac, with return of clear CSF. 17 mL of Omnipaque-180 was injected into the thecal sac, with normal opacification of the nerve roots and cauda equina consistent with free flow within the subarachnoid space.  I personally performed the lumbar puncture and administered the intrathecal contrast. I also personally supervised acquisition of the myelogram images.  TECHNIQUE: Contiguous axial images were obtained through the Lumbar spine after the intrathecal infusion of infusion. Coronal and sagittal reconstructions were obtained of the axial image sets.  COMPARISON:  MRI of the lumbar spine 02/07/2014.  FINDINGS: LUMBAR MYELOGRAM FINDINGS:  The upper nerve roots fill normally. Five non rib-bearing lumbar type vertebral bodies are present. Vertebral body heights and alignment are is normal. Moderate central canal stenosis is present at L3-4 with lateral recess narrowing bilaterally. There is mild lateral recess narrowing on the right at L4-5. The S1 nerve roots fill on both sides.  The upright images demonstrate no significant change in the central canal narrowing at L3-4, predominantly due to posterior element disease. There is no abnormal motion with flexion or extension. A limited range of motion is demonstrated.  CT LUMBAR MYELOGRAM FINDINGS:  The lumbar spine is imaged from the upper aspect T12 through S3-4. Vertebral body heights and AP alignment are normal. The conus medullaris terminates at L1, within normal limits. Rightward curvature of the lumbar spine is centered at L2. Leftward curvature is centered at L5. Asymmetric right-sided endplate changes and vacuum disc are noted at L4-5.  Minimal atherosclerotic changes are present in the aorta without  aneurysm.  L1-2: Moderate facet hypertrophy is present. There is  no significant stenosis.  L2-3: Moderate facet hypertrophy is worse on the right. There is no significant central disc protrusion or stenosis.  L3-4: Advanced facet hypertrophy is present. Moderate ligamentum flavum thickening is noted. This leads to mild lateral recess narrowing bilaterally. Facet spurring contributes to mild to moderate foraminal stenosis, worse on the left.  L4-5: The patient is status post right laminectomy. Residual or recurrent osseous right lateral recess narrowing is present, potentially affecting the right L5 nerve root. Facet spurring and endplate degenerative changes result in moderate right and mild left foraminal stenosis.  L5-S1: Moderate facet arthropathy is present bilaterally with spurring. Mild endplate spurring is present on the right. This results in moderate right foraminal and mild right lateral recess narrowing.  IMPRESSION: LUMBAR MYELOGRAM IMPRESSION:  1. Moderate central canal stenosis at L3-4 without significant exacerbation with standing. 2. No abnormal motion with flexion or extension. 3. Mild lateral recess narrowing or postsurgical change on the right at L4-5.  CT LUMBAR MYELOGRAM IMPRESSION:  1. Multilevel facet hypertrophy throughout the lumbar spine. 2. Ligamentum flavum thickening and a mild broad-based disc protrusion at L3-4 along with advanced facet hypertrophy results in mild to moderate foraminal stenosis, worse on the left. 3. Postoperative changes of right laminectomy at L4-5 with residual recurrent osseous right lateral recess narrowing potentially affecting the right L5 nerve root. 4. Moderate right and mild left foraminal stenosis at L4-5 secondary to endplate change and facet spurring. 5. Moderate facet arthropathy and endplate spurring at P2-R5 with moderate right foraminal and mild right lateral recess narrowing.   Electronically Signed   By: Lawrence Santiago M.D.   On: 03/06/2014 13:32     ASSESSMENT:  Left knee pain        Active Problems:   Lumbar radiculopathy    PLAN: awaiting the results of the MRI, pain control Beaulah Romanek 03/21/2014. 9:08 PM

## 2014-03-21 NOTE — Progress Notes (Signed)
Occupational Therapy Evaluation Patient Details Name: [name] MRN: [id number] DOB: [birth year] Today's Date: 03/21/2014    History of Present Illness Pt is a 80 y.o. patient who on 03/16/14 had a left l3-4 laminotomies aand foraminotomies. She was discharge the following morning. That night she developed pain in the left leg and [facility]12. Pt was seen in MD office for f/u and re-adm to [facility] due to worsening pain.    Clinical Impression   Pt reports she has been in and out of the hospital recently since her back surgery on 03/16/14 and has not been home long to be independent with ADLs. She reports she has been sponge bathing mostly and was scheduled to start home health therapy today, but had to cancel due to hospitalization. Pt requires min guard for functional mobility due to c/o L knee buckling. Dr. Joya Salm in to see pt and MRI of knee pending and has requested ortho consult. Pt would continue to benefit from skilled OT to increase independence prior to d/c.     Follow Up Recommendations  Home health OT;Supervision/Assistance - 24 hour    Equipment Recommendations  None recommended by OT       Precautions / Restrictions Precautions Precautions: Back;Fall Precaution Booklet Issued: Yes (comment) Precaution Comments: Educated pt on 3/3 back precautions. Pt reports fall in June in [place] parking lot where she hit her knee.  Restrictions Weight Bearing Restrictions: No      Mobility Bed Mobility Overal bed mobility: Modified Independent             General bed mobility comments: requires incr time and use of handrails for bed mobility   Transfers Overall transfer level: Needs assistance Equipment used: Rolling walker (2 wheeled) Transfers: Sit to/from [facility] Sit to Stand: Min guard Stand pivot transfers: Min guard       General transfer comment: Min guard due to c/o L knee buckling and VC's for hand placement  and safety with RW    Balance Overall balance assessment: Needs assistance Sitting-balance support: Feet supported;No upper extremity supported Sitting balance-Leahy Scale: Good     Standing balance support: During functional activity;Bilateral upper extremity supported Standing balance-Leahy Scale: Poor Standing balance comment: relies heavily on RW for UE support for all activities                             ADL Overall ADL's : Needs assistance/impaired Eating/Feeding: Independent;Sitting   Grooming: Standing;Min guard (due to c/o L knee buckling)   Upper Body Bathing: Set up;Sitting   Lower Body Bathing: Minimal assistance;Sit to/from stand   Upper Body Dressing : Set up;Sitting   Lower Body Dressing: Sit to/from stand;Moderate assistance   Toilet Transfer: Min guard;Ambulation;RW (sit<>stand)   Toileting- Clothing Manipulation and Hygiene: Min guard;Sit to/from stand Toileting - Clothing Manipulation Details (indicate cue type and reason): Pt reports she has been standing to wipe between legs without difficulty Tub/ Shower Transfer: Tub transfer;Min guard;Ambulation;Tub bench;Rolling walker   Functional mobility during ADLs: Min guard;Rolling walker (due to c/o L knee buckling) General ADL Comments: Re-educated pt on 3/3 back precautions and incorporating into ADLs. Dr. Joya Salm in room during OT session and is concerned regarding pt's L knee and plans to obtain MRI and ortho consult. Pt with hx of Rt TKA.      Vision  Pt wears glasses.  No apparent visual deficits.  Perception Perception Perception Tested?: No   Praxis Praxis Praxis tested?: Within functional limits    Pertinent Vitals/Pain Pt reports pain in Lt knee and states pain is 8/10 (pt was premedicated about 30 minutes prior to session).      Hand Dominance Right   Extremity/Trunk Assessment Upper Extremity Assessment Upper Extremity Assessment: Overall WFL for  tasks assessed   Lower Extremity Assessment Lower Extremity Assessment: Defer to PT evaluation RLE Deficits / Details: WFL LLE Deficits / Details: hip 2+/5; c/o sharp pain LLE: Unable to fully assess due to pain LLE Sensation:  (WFL to light touch)   Cervical / Trunk Assessment Cervical / Trunk Assessment: Normal   Communication Communication Communication: No difficulties   Cognition Arousal/Alertness: Awake/alert Behavior During Therapy: WFL for tasks assessed/performed Overall Cognitive Status: Within Functional Limits for tasks assessed                                Home Living Family/patient expects to be discharged to:: Private residence Living Arrangements: Alone Available Help at Discharge: Family;Available PRN/intermittently Type of Home: Apartment Home Access: Level entry     Home Layout: One level     Bathroom Shower/Tub: Teacher, early years/pre: Handicapped height     Home Equipment: Environmental consultant - 2 wheels;Cane - single point;Walker - 4 wheels;Grab bars - tub/shower;Tub bench;Adaptive equipment Adaptive Equipment: Reacher Additional Comments: pt lives in independent living apartment       Prior Functioning/Environment Level of Independence: Independent with assistive device(s)        Comments: pt was independent with RW for ambulation; had planned to have nursing aide to come over for bathing since recent surgeries and pain. Reports that she has been sponge bathing and has not been home much to attempt tub transfer.    OT Diagnosis: Generalized weakness;Acute pain   OT Problem List: Impaired balance (sitting and/or standing);Decreased range of motion;Decreased knowledge of use of DME or AE;Decreased knowledge of precautions;Pain   OT Treatment/Interventions: Self-care/ADL training;Therapeutic exercise;Energy conservation;DME and/or AE instruction;Therapeutic activities;Patient/family education;Balance training    OT Goals(Current  goals can be found in the care plan section) Acute Rehab OT Goals Patient Stated Goal: to see if my knee is what's hurting me OT Goal Formulation: With patient Time For Goal Achievement: 03/28/14 Potential to Achieve Goals: Good ADL Goals Pt Will Perform Grooming: with modified independence;standing Pt Will Perform Lower Body Bathing: with modified independence;with adaptive equipment;sit to/from stand Pt Will Perform Lower Body Dressing: with modified independence;with adaptive equipment;sit to/from stand Pt Will Transfer to Toilet: with modified independence;ambulating;regular height toilet Pt Will Perform Toileting - Clothing Manipulation and hygiene: with modified independence;sit to/from stand Pt Will Perform Tub/Shower Transfer: with modified independence;tub bench;rolling walker;ambulating Additional ADL Goal #1: Pt will verbalize 3/3 back precautions independently to increase safety with ADLs.   OT Frequency: Min 2X/week              End of Session Equipment Utilized During Treatment: Gait belt;Rolling walker Nurse Communication: Mobility status;Other (comment) (Dr. Joya Salm was in room with pt)  Activity Tolerance: Patient tolerated treatment well Patient left: in chair;with call bell/phone within reach;with chair alarm set   Time: 2778-2423 OT Time Calculation (min): 27 min Charges:  OT General Charges $OT Visit: 1 Procedure OT Evaluation $Initial OT Evaluation Tier I: 1 Procedure OT Treatments $Self Care/Home Management : 8-22 mins  Juluis Rainier [phone] 03/21/2014, 12:12 PM

## 2014-03-21 NOTE — Progress Notes (Signed)
Patient ID: [name], female   DOB: 1933/07/07, 79 y.o.   MRN: [id number] Still having a lot of pain in the left knee. Mri pending. Will call ortho to evaluate her

## 2014-03-21 NOTE — Evaluation (Signed)
Physical Therapy Evaluation Patient Details Name: [name] MRN: [id number] DOB: 04-28-1933 Today's Date: 03/21/2014   History of Present Illness  Pt is a 80 y.o. patient who on 03/16/14 had a left l3-4 laminotomies aand foraminotomies. She was discharge the following morning. That night she developed pain in the left leg and was [facility]11 to 7/12. Pt was seen in MD office for f/u and re-adm to [facility] due to worsening pain.  MRI planned for Lt knee and back planned for today.   Clinical Impression  Pt re-adm from home secondary to above. Pt presents with decreased independence with functional mobility and overall decr activity tolerance secondary to pain and deficits indicated below. Pt to benefit form skilled acute PT to address deficits and maximize independence and functional mobility prior to returning home.     Follow Up Recommendations Home health PT;Supervision - Intermittent    Equipment Recommendations  None recommended by PT    Recommendations for Other Services OT consult     Precautions / Restrictions Precautions Precautions: Back;Fall Precaution Booklet Issued: Yes (comment) Precaution Comments: pt unable to recall back precautions from previous surgery. pt reports she had fall in June.  Restrictions Weight Bearing Restrictions: No      Mobility  Bed Mobility Overal bed mobility: Modified Independent             General bed mobility comments: requires incr time and use of handrails for bed mobility   Transfers Overall transfer level: Needs assistance Equipment used: Rolling walker (2 wheeled) Transfers: Sit to/from Stand Sit to Stand: Min guard         General transfer comment: min guard to steady and cues for hand placement and safety with RW   Ambulation/Gait Ambulation/Gait assistance: Min guard Ambulation Distance (Feet): 50 Feet Assistive device: Rolling walker (2 wheeled) Gait Pattern/deviations: Decreased stride  length;Step-through pattern;Shuffle;Trunk flexed Gait velocity: decreased due to pain  Gait velocity interpretation: Below normal speed for age/gender General Gait Details: guard to steady and for safety; pt c/o sharp shooting pain from Lt hip to Lt knee; cues for upright posture and proper gt sequencing   Stairs            Wheelchair Mobility    Modified Rankin (Stroke Patients Only)       Balance Overall balance assessment: Needs assistance Sitting-balance support: Feet supported;No upper extremity supported Sitting balance-Leahy Scale: Good     Standing balance support: During functional activity;Bilateral upper extremity supported Standing balance-Leahy Scale: Poor Standing balance comment: relies heavily on RW for UE support for all activities                              Pertinent Vitals/Pain 10/10; RN notified. Pt requesting pain medicine.     Home Living Family/patient expects to be discharged to:: Private residence Living Arrangements: Alone Available Help at Discharge: Family;Available PRN/intermittently Type of Home: Apartment Home Access: Level entry     Home Layout: One level Home Equipment: Walker - 2 wheels;Cane - single point;Walker - 4 wheels;Grab bars - tub/shower;Tub bench;Adaptive equipment Additional Comments: pt lives in independent living apartment     Prior Function Level of Independence: Independent with assistive device(s)         Comments: pt was independent with RW for ambulation; had planned to have nursing aide to come over for bathing since recent surgeries and pain      Hand  Dominance   Dominant Hand: Right    Extremity/Trunk Assessment   Upper Extremity Assessment: Defer to OT evaluation           Lower Extremity Assessment: LLE deficits/detail RLE Deficits / Details: WFL LLE Deficits / Details: hip 2+/5; c/o sharp pain  Cervical / Trunk Assessment: Normal  Communication   Communication: No  difficulties  Cognition Arousal/Alertness: Awake/alert Behavior During Therapy: WFL for tasks assessed/performed Overall Cognitive Status: Within Functional Limits for tasks assessed                      General Comments      Exercises        Assessment/Plan    PT Assessment Patient needs continued PT services  PT Diagnosis Difficulty walking;Generalized weakness;Acute pain   PT Problem List Decreased strength;Decreased activity tolerance;Decreased balance;Decreased mobility;Decreased knowledge of use of DME;Pain  PT Treatment Interventions DME instruction;Gait training;Functional mobility training;Therapeutic activities;Therapeutic exercise;Patient/family education;Balance training   PT Goals (Current goals can be found in the Care Plan section) Acute Rehab PT Goals Patient Stated Goal: to figure out why im having this sharp shooting pain  PT Goal Formulation: With patient Time For Goal Achievement: 03/28/14 Potential to Achieve Goals: Good    Frequency Min 4X/week   Barriers to discharge Decreased caregiver support pt lives at independent living apartment    Co-evaluation               End of Session Equipment Utilized During Treatment: Gait belt Activity Tolerance: Patient limited by pain Patient left: in bed;with call bell/phone within reach;with bed alarm set Nurse Communication: Mobility status         Time: 1002-1020 PT Time Calculation (min): 18 min   Charges:   PT Evaluation $Initial PT Evaluation Tier I: 1 Procedure PT Treatments $Gait Training: 8-22 mins   PT G CodesGustavus Bryant, [place]  [phone] 03/21/2014, 11:30 AM

## 2014-03-22 ENCOUNTER — Inpatient Hospital Stay (HOSPITAL_COMMUNITY): Payer: Medicare Other

## 2014-03-22 DIAGNOSIS — IMO0002 Reserved for concepts with insufficient information to code with codable children: Secondary | ICD-10-CM | POA: Diagnosis not present

## 2014-03-22 LAB — GLUCOSE, CAPILLARY
Glucose-Capillary: 139 mg/dL — ABNORMAL HIGH (ref 70–99)
Glucose-Capillary: 173 mg/dL — ABNORMAL HIGH (ref 70–99)
Glucose-Capillary: 213 mg/dL — ABNORMAL HIGH (ref 70–99)
Glucose-Capillary: 178 mg/dL — ABNORMAL HIGH (ref 70–99)

## 2014-03-22 MED ORDER — LORAZEPAM 2 MG/ML IJ SOLN
1.0000 mg | Freq: Once | INTRAMUSCULAR | Status: AC
  Administered 2014-03-22: 1 mg via INTRAVENOUS
  Filled 2014-03-22: qty 1

## 2014-03-22 MED ORDER — GADOBENATE DIMEGLUMINE 529 MG/ML IV SOLN
20.0000 mL | Freq: Once | INTRAVENOUS | Status: AC | PRN
  Administered 2014-03-22: 20 mL via INTRAVENOUS

## 2014-03-22 NOTE — Progress Notes (Signed)
PT Cancellation Note  Patient Details Name: [name] MRN: [id number] DOB: Jan 05, 1933   Cancelled Treatment:    Reason Eval/Treat Not Completed: Patient at procedure or test/unavailable. Pt off floor to MRI. Will re-attempt to see pt as time allows.    Elie Confer [place], [place] 03/22/2014, 2:37 PM

## 2014-03-22 NOTE — Progress Notes (Signed)
Patient ID: [name], female   DOB: 17-Aug-1933, 80 y.o.   MRN: [id number] Patient still waiting for lumbar and knee mri. Ortho to see her.

## 2014-03-22 NOTE — Progress Notes (Deleted)
Patient ID: [name], female   DOB: 02/07/1933, 80 y.o.   MRN: [id number] Lumbar mri shows post-op changes. Knee mri pending. Ortho to see her.

## 2014-03-23 ENCOUNTER — Encounter (HOSPITAL_COMMUNITY): Payer: Self-pay

## 2014-03-23 DIAGNOSIS — IMO0002 Reserved for concepts with insufficient information to code with codable children: Secondary | ICD-10-CM | POA: Diagnosis not present

## 2014-03-23 LAB — GLUCOSE, CAPILLARY
Glucose-Capillary: 167 mg/dL — ABNORMAL HIGH (ref 70–99)
Glucose-Capillary: 174 mg/dL — ABNORMAL HIGH (ref 70–99)

## 2014-03-23 NOTE — Progress Notes (Signed)
Patient is D/C home in stable condition. DC instruction and back precautions  given to patient and patient verbalized understanding.

## 2014-03-23 NOTE — Progress Notes (Signed)
03/23/14 [place], MSN, CM- Per conversation with Dr Joya Salm, no home health orders will be placed at this time.  Dr Joya Salm is awaiting orthopedic surgeon's input prior to placing any discharge orders.

## 2014-03-23 NOTE — Progress Notes (Signed)
UR complete.  Lyfe Monger RN, MSN 

## 2014-03-23 NOTE — Care Management Note (Addendum)
  Page 1 of 1   03/23/2014     11:08:03 AM CARE MANAGEMENT NOTE 03/23/2014  Patient:  Morgan, [name]   Account Number:  [id number]  Date Initiated:  03/21/2014  Documentation initiated by:  Olga Coaster  Subjective/Objective Assessment:   ADMITTED WITH BACK PAIN, RECENT BACK SURGERY     Action/Plan:   CM FOLLOWING FOR DCP/ PATIENT IS ACTIVE WITH [facility] AS PRIOR TO ADMISSION   Anticipated DC Date:  03/28/2014   Anticipated DC Plan:  [place]  CM consult      Choice offered to / List presented to:             Status of service:  In process, will continue to follow Medicare Important Message given?  YES (If response is "NO", the following Medicare IM given date fields will be blank) Date Medicare IM given:  03/23/2014 Medicare IM given by:  Lorne Skeens Date Additional Medicare IM given:   Additional Medicare IM given by:    Discharge Disposition:    Per UR Regulation:  Reviewed for med. necessity/level of care/duration of stay  If discussed at [place] of Stay Meetings, dates discussed:    Comments:  03/23/14 [place], MSN, CM- Medicare IM letter provided   7/15/2015Mindi Slicker RN,BSN,MHA [phone]

## 2014-03-23 NOTE — Progress Notes (Addendum)
Physical Therapy Treatment Patient Details Name: [name] MRN: [id number] DOB: February 26, 1933 Today's Date: 03/23/2014    History of Present Illness Pt is a 81 y.o. patient who on 03/16/14 had a left l3-4 laminotomies aand foraminotomies. She was discharge the following morning. That night she developed pain in the left leg and was admitted to [facility] from 7/11 to 7/12. Pt was seen in MD office for f/u and re-adm to [facility] due to worsening pain.     PT Comments    Pt appears to be at baseline from mobility standpoint. Denies any pain during session today. Was able to ambulate incr distance without difficulty or LOB. Recommend continue to use RW for safety. Pt is safe from mobility standpoint to D/C home.   Follow Up Recommendations  Home health PT;Supervision - Intermittent (pt would like PT at her independent living center )     Equipment Recommendations  None recommended by PT    Recommendations for Other Services OT consult     Precautions / Restrictions Precautions Precautions: Back Precaution Comments: pt able to recall back precautions independently; cues during session to adhere  Restrictions Weight Bearing Restrictions: No    Mobility  Bed Mobility Overal bed mobility: Modified Independent             General bed mobility comments: incr time due to pain; HOB flattened to simulate home   Transfers Overall transfer level: Modified independent Equipment used: Rolling walker (2 wheeled) Transfers: Sit to/from Stand           General transfer comment: mod i for transfers from bed and from standard height toilet seat   Ambulation/Gait Ambulation/Gait assistance: Supervision;Modified independent (Device/Increase time) Ambulation Distance (Feet): 400 Feet Assistive device: Rolling walker (2 wheeled) Gait Pattern/deviations: Step-through pattern;Wide base of support Gait velocity: able to incr with cues minimally    General Gait Details: supervision to  mod I for mobility at this time; was able to navigate hallways and around obstacles without LOB or difficulty; pt at baseline from mobility standpoint; recommend using RW upon acute D/C for safety    Stairs            Wheelchair Mobility    Modified Rankin (Stroke Patients Only)       Balance                                    Cognition Arousal/Alertness: Awake/alert Behavior During Therapy: WFL for tasks assessed/performed Overall Cognitive Status: Within Functional Limits for tasks assessed                      Exercises      General Comments General comments (skin integrity, edema, etc.): pt performed hygiene in bathroom mod I; able to stand without UE support for ADLs       Pertinent Vitals/Pain No c/o pain     Home Living                      Prior Function            PT Goals (current goals can now be found in the care plan section) Acute Rehab PT Goals Patient Stated Goal: to go home today no matter what PT Goal Formulation: With patient Time For Goal Achievement: 03/28/14 Potential to Achieve Goals: Good Progress towards PT goals: Goals met/education completed, patient discharged from PT  Frequency  Min 4X/week    PT Plan Current plan remains appropriate    Co-evaluation             End of Session Equipment Utilized During Treatment: Gait belt Activity Tolerance: Patient tolerated treatment well Patient left: in chair;with call bell/phone within reach;with chair alarm set     Time: [phone] PT Time Calculation (min): 24 min  Charges:  $Gait Training: 23-37 mins                    G Codes:  Functional Assessment Tool Used: clinical judgement  Functional Limitation: Mobility: Walking and moving around Mobility: Walking and Moving Around Current Status [phone]): At least 1 percent but less than 20 percent impaired, limited or restricted Mobility: Walking and Moving Around Goal Status [phone]): At least  1 percent but less than 20 percent impaired, limited or restricted Mobility: Walking and Moving Around Discharge Status [phone]): At least 1 percent but less than 20 percent impaired, limited or restricted   Gustavus Bryant, [place] 03/23/2014, 3:07 PM

## 2014-03-23 NOTE — Progress Notes (Signed)
Patient ID: [name], female   DOB: 05-08-1933, 80 y.o.   MRN: [id number] Better after the knee infiltration, was able to ambulate with no pain  Plan, dc today

## 2014-03-23 NOTE — Progress Notes (Signed)
Patient ID: DA MICHELLE, female   DOB: 01/07/1933, 80 y.o.   MRN: [id number] Resting, pain in the left knee. Mri lumbar shows some collection in the epidural space but i do not believe thids is the source of his localized knee pain. Ortho to decide about streroid infiltration

## 2014-03-23 NOTE — Progress Notes (Signed)
Occupational Therapy Treatment Patient Details Name: [name] MRN: [id number] DOB: 01-04-1933 Today's Date: 03/23/2014    History of present illness Pt is a 80 y.o. patient who on 03/16/14 had a left l3-4 laminotomies aand foraminotomies. She was discharge the following morning. That night she developed pain in the left leg and [facility]. Pt was seen in MD office for f/u and re-adm to [facility] due to worsening pain.    OT comments  Pt seen today for ADLs and functional mobility. Pt moving well at Supervision level and maintaining back precautions with min verbal cues. Pt is motivated to go home today.   Follow Up Recommendations   HHOT    Equipment Recommendations  None recommended by OT       Precautions / Restrictions Precautions Precautions: Back Precaution Comments: pt able to recall back precautions independently; cues during session to adhere  Restrictions Weight Bearing Restrictions: No       Mobility Bed Mobility Overal bed mobility: Modified Independent             General bed mobility comments: incr time due to pain; HOB flattened to simulate home   Transfers Overall transfer level: Modified independent Equipment used: Rolling walker (2 wheeled) Transfers: Sit to/from Stand                Balance Overall balance assessment: Needs assistance Sitting-balance support: No upper extremity supported;Feet supported Sitting balance-Leahy Scale: Good     Standing balance support: Single extremity supported;During functional activity Standing balance-Leahy Scale: Poor Standing balance comment: Pt with improved balance and less reliance on RW during ambulation                   ADL Overall ADL's : Needs assistance/impaired     Grooming: Supervision/safety;Standing           Upper Body Dressing : Set up;Standing       Toilet Transfer: Supervision/safety;Ambulation;RW   Toileting- Clothing Manipulation  and Hygiene: Supervision/safety;Sit to/from stand;Adhering to back precautions   Tub/ Shower Transfer: Supervision/safety   Functional mobility during ADLs: Supervision/safety;Rolling walker General ADL Comments: Reviewed back precautions and incorporating into ADLs. Pt's knee feeling significantly better and pt reports she received an injection in her knee. Pt moving very well at Supervision level.          Perception     Praxis      Cognition  Arousal/Alertness: Awake/alert Behavior During Therapy: WFL for tasks assessed/performed Overall Cognitive Status: Within Functional Limits for tasks assessed                                    Pertinent Vitals/ Pain       "Stiffness" in back, repositioned for comfort.          Frequency Min 2X/week     Progress Toward Goals  OT Goals(current goals can now be found in the care plan section)  Progress towards OT goals: Progressing toward goals  Acute Rehab OT Goals Patient Stated Goal: to go home today no matter what OT Goal Formulation: With patient Time For Goal Achievement: 03/28/14 Potential to Achieve Goals: Good ADL Goals Pt Will Perform Grooming: with modified independence;standing Pt Will Perform Lower Body Bathing: with modified independence;with adaptive equipment;sit to/from stand Pt Will Perform Lower Body Dressing: with modified independence;with adaptive equipment;sit to/from stand Pt Will Transfer to  Toilet: with modified independence;ambulating;regular height toilet Pt Will Perform Toileting - Clothing Manipulation and hygiene: with modified independence;sit to/from stand Pt Will Perform Tub/Shower Transfer: with modified independence;tub bench;rolling walker;ambulating Additional ADL Goal #1: Pt will verbalize 3/3 back precautions independently to increase safety with ADLs.   Plan Discharge plan remains appropriate       End of Session Equipment Utilized During Treatment: Gait belt;Rolling  walker   Activity Tolerance Patient tolerated treatment well   Patient Left in chair;with call bell/phone within reach;with chair alarm set       Functional Assessment Tool Used: clinical judgement Functional Limitation: Self care Self Care Current Status (D3220): At least 1 percent but less than 20 percent impaired, limited or restricted Self Care Goal Status (U5427): At least 1 percent but less than 20 percent impaired, limited or restricted Self Care Discharge Status [phone]): At least 1 percent but less than 20 percent impaired, limited or restricted   Time: 1400-1424 OT Time Calculation (min): 24 min  Charges: OT G-codes **NOT FOR INPATIENT CLASS** Functional Assessment Tool Used: clinical judgement Functional Limitation: Self care Self Care Current Status (S2831): At least 1 percent but less than 20 percent impaired, limited or restricted Self Care Goal Status (D1761): At least 1 percent but less than 20 percent impaired, limited or restricted Self Care Discharge Status [phone]): At least 1 percent but less than 20 percent impaired, limited or restricted OT General Charges $OT Visit: 1 Procedure OT Treatments $Self Care/Home Management : 23-37 mins  Juluis Rainier [phone] 03/23/2014, 5:17 PM

## 2014-03-23 NOTE — Progress Notes (Signed)
SPORTS MEDICINE AND JOINT REPLACEMENT  Lara Mulch, MD   Carlynn Spry, PA-C [place], [place], [place]  [address]                             [phone]   PROGRESS NOTE  Subjective:  negative for Chest Pain  negative for Shortness of Breath  negative for Nausea/Vomiting   negative for Calf Pain  negative for Bowel Movement   Tolerating Diet: yes         Patient reports pain as 5 on 0-10 scale.    Objective: Vital signs in last 24 hours:   Patient Vitals for the past 24 hrs:  BP Temp Temp src Pulse Resp SpO2  03/23/14 1021 155/59 mmHg 98.5 F (36.9 C) Oral 71 19 99 %  03/23/14 0500 112/58 mmHg 98.6 F (37 C) Oral 79 18 97 %  03/23/14 0117 122/89 mmHg 98.8 F (37.1 C) Oral 80 18 97 %  03/22/14 2150 147/58 mmHg - - 76 - -  03/22/14 2107 147/58 mmHg 98.4 F (36.9 C) Oral 76 18 98 %  03/22/14 1824 142/52 mmHg 98.2 F (36.8 C) Oral 91 18 100 %  03/22/14 1418 121/51 mmHg 98.4 F (36.9 C) Oral 90 18 100 %    @flow {1959:LAST@   Intake/Output from previous day:   07/16 0701 - 07/17 0700 In: 443 [P.O.:440; I.V.:3] Out: -    Intake/Output this shift:       Intake/Output     07/16 0701 - 07/17 0700 07/17 0701 - 07/18 0700   P.O. 440    I.V. (mL/kg) 3 (0)    Total Intake(mL/kg) 443 (3.7)    Net +443          Urine Occurrence 4 x    Stool Occurrence 1 x       LABORATORY DATA:  Recent Labs  03/17/14 2341 03/18/14 0304 03/18/14 0625 03/20/14 2245  WBC 12.3*  --  11.4* 6.9  HGB 7.8* 10.4* 10.0* 9.8*  HCT 23.4* 31.5* 30.7* 30.1*  PLT 255  --  191 217    Recent Labs  03/17/14 2341 03/21/14 1355  NA 138 138  K 3.9 5.3  CL 101 101  CO2 25 26  BUN 29* 18  CREATININE 1.40* 0.81  GLUCOSE 338* 200*  CALCIUM 8.9 8.8   Lab Results  Component Value Date   INR 1.14 02/11/2014   INR 1.17 05/01/2011      PAST MEDICAL HISTORY:   Past Medical History  Diagnosis Date  . Hypertension   . Hyperlipemia   . Arthritis   . Cancer     skin  .  Staph infection     1970's  . Essential and other specified forms of tremor 05/03/2013  . Cholelithiasis   . Chronic insomnia   . Chronic low back pain   . Obesity   . Peripheral edema   . Wrist fracture     left October 2014  . Complication of anesthesia   . PONV (postoperative nausea and vomiting)   . Hx of acute renal failure 02/2014    admitted to [facility]  . DDD (degenerative disc disease), lumbar   . Hx: UTI (urinary tract infection) 02/2014  . Diabetes mellitus     Iddm x 12 years  . Diabetic peripheral neuropathy     PAST SURGICAL HISTORY:   Past Surgical History  Procedure Laterality Date  . Abdominal  hysterectomy    . Hernia repair    . Total knee arthroplasty    . Cataract extraction    . Oophorectomy    . Neck surgery    . Cardiac catheterization  2000    Dr Einar Gip  . Back surgery  2005  . Eye surgery    . Colonoscopy  >10 years  . Lumbar laminectomy/decompression microdiscectomy Left 03/16/2014    Procedure: LUMBAR LAMINECTOMY/DECOMPRESSION MICRODISCECTOMY 1 LEVEL  lumbar level three/four;  Surgeon: [name], MD;  Location: [place];  Service: Neurosurgery;  Laterality: Left;    MEDICATIONS:   Medications Prior to Admission  Medication Sig Dispense Refill  . acetaminophen (TYLENOL) 500 MG tablet Take 500 mg by mouth 3 (three) times daily. Takes with gabapentin.      . chlorthalidone (HYGROTON) 25 MG tablet Take 25 mg by mouth daily.       . cloNIDine (CATAPRES) 0.1 MG tablet Take 0.1 mg by mouth 2 (two) times daily.        [name] gabapentin (NEURONTIN) 300 MG capsule Take 300 mg by mouth 3 (three) times daily.      . insulin glargine (LANTUS) 100 UNIT/ML injection Inject 65 Units into the skin at bedtime.      [name] losartan (COZAAR) 100 MG tablet Take 100 mg by mouth daily.      . metFORMIN (GLUCOPHAGE) 1000 MG tablet Take 1,000 mg by mouth daily with breakfast.       . methocarbamol (ROBAXIN) 500 MG tablet Take 500 mg by mouth every 6 (six) hours as needed  for muscle spasms.      . nitrofurantoin, macrocrystal-monohydrate, (MACROBID) 100 MG capsule Take 100 mg by mouth 2 (two) times daily.      [name] oxyCODONE-acetaminophen (PERCOCET/ROXICET) 5-325 MG per tablet Take 2 tablets by mouth every 6 (six) hours as needed for severe pain.      . pravastatin (PRAVACHOL) 40 MG tablet Take 40 mg by mouth at bedtime.       . propranolol (INDERAL) 10 MG tablet Take 20 mg by mouth 2 (two) times daily.       . TRADJENTA 5 MG TABS tablet Take 5 mg by mouth daily.        ALLERGIES:   Allergies  Allergen Reactions  . Codeine Nausea And Vomiting  . Dilaudid [Hydromorphone Hcl] Nausea And Vomiting and Other (See Comments)    Severe sweating  . Ciprofloxacin     Acute kidney failure  . Mysoline [Primidone]     Nausea    REVIEW OF SYSTEMS:   Negative except hpi  FAMILY HISTORY:   Family History  Problem Relation Age of Onset  . Cancer Brother     prostate  . Heart disease Brother   . Alzheimer's disease Brother   . Stroke Mother   . Hypertension Mother   . COPD Brother   . Cancer Brother     Prostate cancer  . Hypertension Sister     Congestive heart failure    SOCIAL HISTORY:   reports that she has never smoked. She has never used smokeless tobacco. She reports that she does not drink alcohol or use illicit drugs.  PHYSICAL EXAM:  General appearance: alert, cooperative and no distress Resp: clear to auscultation bilaterally Cardio: regular rate and rhythm, S1, S2 normal, no murmur, click, rub or gallop Extremities: extremities normal, atraumatic, no cyanosis or edema and Homans sign is negative, no sign of DVT Skin: Skin color, texture, turgor normal.  No rashes or lesions Neurologic: Alert and oriented X 3, normal strength and tone. Normal symmetric reflexes. Normal coordination and gait    LABORATORY STUDIES:  Recent Labs  03/20/14 2245  WBC 6.9  HGB 9.8*  HCT 30.1*  PLT 217     Recent Labs  03/21/14 1355  NA 138  K 5.3  CL 101   CO2 26  GLUCOSE 200*  BUN 18  CREATININE 0.81  CALCIUM 8.8    STUDIES/RESULTS:  Dg Chest 2 View  03/18/2014   CLINICAL DATA:  Hyperglycemia, recent spine surgery  EXAM: CHEST  2 VIEW  COMPARISON:  03/14/2014  FINDINGS: The heart size and mediastinal contours are within normal limits. Both lungs are clear. The visualized skeletal structures are unremarkable. Cervical fusion hardware partly visualized. Lungs are hypoaerated with crowding of the bronchovascular markings.  IMPRESSION: No active cardiopulmonary disease.   Electronically Signed   By: Conchita Paris M.D.   On: 03/18/2014 01:06   Dg Chest 2 View  03/14/2014   CLINICAL DATA:  Hypertension.  EXAM: CHEST  2 VIEW  COMPARISON:  April 26, 2011.  FINDINGS: The heart size and mediastinal contours are within normal limits. Both lungs are clear. The visualized skeletal structures are unremarkable.  IMPRESSION: No acute cardiopulmonary abnormality seen.   Electronically Signed   By: Sabino Dick M.D.   On: 03/14/2014 13:55   Dg Lumbar Spine 2-3 Views  03/16/2014   CLINICAL DATA:  Lumbar laminectomy  EXAM: LUMBAR SPINE - 2-3 VIEW  COMPARISON:  None.  FINDINGS: To cross-table lateral images were obtained. On the first submitted image, There is a metallic probe with its tip posterior to the mid L4 vertebral body level. On the second submitted image, metallic probe tip is posterior to the L3-4 interspace level. There is marked disc space narrowing at L4-5. There is moderate disc space narrowing at L3-4 and L5-S1. No fracture or spondylolisthesis.  IMPRESSION: Areas of osteoarthritic change. Metallic probes as described. Note that on the second submitted image, the metallic probe tip is posterior to the L3-4 level.   Electronically Signed   By: Lowella Grip M.D.   On: 03/16/2014 13:15   Ct Lumbar Spine W Contrast  03/06/2014   CLINICAL DATA:  Low back and left lower extremity pain. Radicular symptoms within an S1 dermatome. Recent fall.  EXAM:  LUMBAR MYELOGRAM  FLUOROSCOPY TIME:  1 min and 5 seconds  PROCEDURE: After thorough discussion of risks and benefits of the procedure including bleeding, infection, injury to nerves, blood vessels, adjacent structures as well as headache and CSF leak, written and oral informed consent was obtained. Consent was obtained by Dr. Lawrence Santiago. Time out form was completed.  Patient was positioned prone on the fluoroscopy table. Local anesthesia was provided with 1% lidocaine without epinephrine after prepped and draped in the usual sterile fashion. After review of prior imaging, the puncture was performed at L1-2 using a 3 1/2 inch 22-gauge spinal needle via a right paramedian approach. Using a single pass through the dura, the needle was placed within the thecal sac, with return of clear CSF. 17 mL of Omnipaque-180 was injected into the thecal sac, with normal opacification of the nerve roots and cauda equina consistent with free flow within the subarachnoid space.  I personally performed the lumbar puncture and administered the intrathecal contrast. I also personally supervised acquisition of the myelogram images.  TECHNIQUE: Contiguous axial images were obtained through the Lumbar spine after the intrathecal infusion of  infusion. Coronal and sagittal reconstructions were obtained of the axial image sets.  COMPARISON:  MRI of the lumbar spine 02/07/2014.  FINDINGS: LUMBAR MYELOGRAM FINDINGS:  The upper nerve roots fill normally. Five non rib-bearing lumbar type vertebral bodies are present. Vertebral body heights and alignment are is normal. Moderate central canal stenosis is present at L3-4 with lateral recess narrowing bilaterally. There is mild lateral recess narrowing on the right at L4-5. The S1 nerve roots fill on both sides.  The upright images demonstrate no significant change in the central canal narrowing at L3-4, predominantly due to posterior element disease. There is no abnormal motion with flexion or  extension. A limited range of motion is demonstrated.  CT LUMBAR MYELOGRAM FINDINGS:  The lumbar spine is imaged from the upper aspect T12 through S3-4. Vertebral body heights and AP alignment are normal. The conus medullaris terminates at L1, within normal limits. Rightward curvature of the lumbar spine is centered at L2. Leftward curvature is centered at L5. Asymmetric right-sided endplate changes and vacuum disc are noted at L4-5.  Minimal atherosclerotic changes are present in the aorta without aneurysm.  L1-2: Moderate facet hypertrophy is present. There is no significant stenosis.  L2-3: Moderate facet hypertrophy is worse on the right. There is no significant central disc protrusion or stenosis.  L3-4: Advanced facet hypertrophy is present. Moderate ligamentum flavum thickening is noted. This leads to mild lateral recess narrowing bilaterally. Facet spurring contributes to mild to moderate foraminal stenosis, worse on the left.  L4-5: The patient is status post right laminectomy. Residual or recurrent osseous right lateral recess narrowing is present, potentially affecting the right L5 nerve root. Facet spurring and endplate degenerative changes result in moderate right and mild left foraminal stenosis.  L5-S1: Moderate facet arthropathy is present bilaterally with spurring. Mild endplate spurring is present on the right. This results in moderate right foraminal and mild right lateral recess narrowing.  IMPRESSION: LUMBAR MYELOGRAM IMPRESSION:  1. Moderate central canal stenosis at L3-4 without significant exacerbation with standing. 2. No abnormal motion with flexion or extension. 3. Mild lateral recess narrowing or postsurgical change on the right at L4-5.  CT LUMBAR MYELOGRAM IMPRESSION:  1. Multilevel facet hypertrophy throughout the lumbar spine. 2. Ligamentum flavum thickening and a mild broad-based disc protrusion at L3-4 along with advanced facet hypertrophy results in mild to moderate foraminal  stenosis, worse on the left. 3. Postoperative changes of right laminectomy at L4-5 with residual recurrent osseous right lateral recess narrowing potentially affecting the right L5 nerve root. 4. Moderate right and mild left foraminal stenosis at L4-5 secondary to endplate change and facet spurring. 5. Moderate facet arthropathy and endplate spurring at X5-M8 with moderate right foraminal and mild right lateral recess narrowing.   Electronically Signed   By: Lawrence Santiago M.D.   On: 03/06/2014 13:32   Mr Lumbar Spine W Wo Contrast  03/23/2014   CLINICAL DATA:  Surgery March 16, 2014 now with left leg pain.  EXAM: MRI LUMBAR SPINE WITHOUT AND WITH CONTRAST  TECHNIQUE: Multiplanar and multiecho pulse sequences of the lumbar spine were obtained without and with intravenous contrast.  CONTRAST:  53mL MULTIHANCE GADOBENATE DIMEGLUMINE 529 MG/ML IV SOLN  COMPARISON:  Lumbar spine radiograph March 20, 2014 and MRI of the lumbar spine February 07, 2014  FINDINGS: Lumbar vertebral bodies intact and aligned and maintenance of lumbar lordosis. Similar moderate to severe L4-5 disc height loss (using previously described reference levels), remaining lumbar disc morphology's generally preserved with decreased  T2 signal within the L3-4 and L4-5 disc most consistent with mild desiccation. No suspicious osseous or intradiscal enhancement.  Status post interval left L3-4 hemilaminectomy with 1.1 x 2.1 x 3.6 cm (transverse by AP by CC) T2 bright low signal fluid collection with minimal peripheral enhancement in the surgical bed, extending into the epidural space and left lateral recess (axial 16/34) narrowing the thecal sac to 8 x 9 mm, low collection likely affects the traversing left L4 nerve.  Conus medullaris terminates at L1 and appears normal in morphology and signal characteristics. Centrally displaced cauda equina due to the spinal fluid collection, without abnormal cord, nor leptomeningeal enhancement. Right greater than left  moderate to severe paraspinal muscle atrophy, with enhancing left multifidus muscle denervation at L3-4 caudally. Small nonenhancing subcutaneous fluid collection along the surgical approach. Included prevertebral soft tissues are nonsuspicious.  Level by level evaluation:  L1-2 No disc bulge. Mild facet arthropathy and ligamentum flavum redundancy without canal stenosis or neural foraminal narrowing.  L2-3: Minimal annular bulging, moderate to severe facet arthropathy and ligamentum flavum redundancy. Mild canal stenosis. No neural foraminal narrowing.  L3-4: Left laminectomy, fluid collection results in effacement of thecal sac. Moderate right greater left facet arthropathy. Moderate left greater than right neural foraminal narrowing.  L4-5: Large right subarticular to extra foraminal disc protrusion likely affects the exited right L4 nerve, unchanged. Status post right hemilaminectomy. No canal stenosis though, partial effacement of right lateral recess may affect the traversing right L5 nerve. Moderate to severe right neural foraminal narrowing.  L5-S1: Status post right and possibly left laminectomies. No disc bulge, canal stenosis. Moderate to severe right and moderate left facet arthropathy. Moderate to severe right neural foraminal narrowing.  IMPRESSION: Status post interval left L3-4 hemilaminectomy, with a 1.1 x 2.1 x 3.6 cm fluid collection within the surgical bed, likely reflecting degenerating hematoma, less likely abscess or pseudomeningocele though, clinical correlation is recommended. Fluid collection results in effaced thecal sac, and extends into the left lateral recess which likely affects the traversing left L4 nerve. Moderate left greater than right L3-4 neural foraminal narrowing.  Status post right L4 and right L5 hemilaminectomy's, possibly on the left at L5-S1, unchanged. No acute fracture malalignment.  Mild canal stenosis L2-3. Neural foraminal narrowing L3-4 through L5-S1: Moderate to  severe on the right at L4-5 and L5-S1, unchanged.   Electronically Signed   By: Elon Alas   On: 03/23/2014 00:31   Mr Knee Left  Wo Contrast  03/23/2014   CLINICAL DATA:  Left knee pain and swelling.  Recent fall.  EXAM: MRI OF THE LEFT KNEE WITHOUT CONTRAST  TECHNIQUE: Multiplanar, multisequence MR imaging of the knee was performed. No intravenous contrast was administered.  COMPARISON:  Radiographs 03/22/2014.  FINDINGS: MENISCI  Medial meniscus:  Intact with normal morphology.  Lateral meniscus: Mild free edge degeneration without discrete tear or displaced meniscal fragment.  LIGAMENTS  Cruciates: The anterior cruciate ligament is intact. The posterior cruciate ligament demonstrates intrasubstance signal, most consistent with mucoid degeneration. No ligamentous discontinuity identified.  Collaterals:  Intact.  CARTILAGE  Patellofemoral: Moderate diffuse chondral thinning with subchondral cyst formation in the trochlea.  Medial: Mild diffuse chondral thinning with peripheral and central osteophyte formation.  Lateral: Moderate diffuse chondral thinning with peripheral and central osteophyte formation.  Joint:  No significant joint effusion.  No loose bodies observed.  Popliteal Fossa: No Baker's cyst. There is generalized subcutaneous edema surrounding the knee. Soft tissue calcifications are better demonstrated on prior radiographs.  The popliteal vessels appear unremarkable.  Extensor Mechanism:  Intact.  Bones:  No significant extra-articular osseous findings.  IMPRESSION: 1. Tricompartmental osteoarthritis.  No acute osseous findings. 2. Mild free edge degeneration of the lateral meniscus and mucoid degeneration of the PCL. No meniscal tear or acute ligamentous findings. 3. Generalized subcutaneous edema surrounding the knee associated with calcifications on radiographs. This could indicate connective tissue disorder or chronic venous stasis.   Electronically Signed   By: Camie Patience M.D.   On:  03/23/2014 08:00   Dg Myelography Lumbar Inj Lumbosacral  03/06/2014   CLINICAL DATA:  Low back and left lower extremity pain. Radicular symptoms within an S1 dermatome. Recent fall.  EXAM: LUMBAR MYELOGRAM  FLUOROSCOPY TIME:  1 min and 5 seconds  PROCEDURE: After thorough discussion of risks and benefits of the procedure including bleeding, infection, injury to nerves, blood vessels, adjacent structures as well as headache and CSF leak, written and oral informed consent was obtained. Consent was obtained by Dr. Lawrence Santiago. Time out form was completed.  Patient was positioned prone on the fluoroscopy table. Local anesthesia was provided with 1% lidocaine without epinephrine after prepped and draped in the usual sterile fashion. After review of prior imaging, the puncture was performed at L1-2 using a 3 1/2 inch 22-gauge spinal needle via a right paramedian approach. Using a single pass through the dura, the needle was placed within the thecal sac, with return of clear CSF. 17 mL of Omnipaque-180 was injected into the thecal sac, with normal opacification of the nerve roots and cauda equina consistent with free flow within the subarachnoid space.  I personally performed the lumbar puncture and administered the intrathecal contrast. I also personally supervised acquisition of the myelogram images.  TECHNIQUE: Contiguous axial images were obtained through the Lumbar spine after the intrathecal infusion of infusion. Coronal and sagittal reconstructions were obtained of the axial image sets.  COMPARISON:  MRI of the lumbar spine 02/07/2014.  FINDINGS: LUMBAR MYELOGRAM FINDINGS:  The upper nerve roots fill normally. Five non rib-bearing lumbar type vertebral bodies are present. Vertebral body heights and alignment are is normal. Moderate central canal stenosis is present at L3-4 with lateral recess narrowing bilaterally. There is mild lateral recess narrowing on the right at L4-5. The S1 nerve roots fill on both  sides.  The upright images demonstrate no significant change in the central canal narrowing at L3-4, predominantly due to posterior element disease. There is no abnormal motion with flexion or extension. A limited range of motion is demonstrated.  CT LUMBAR MYELOGRAM FINDINGS:  The lumbar spine is imaged from the upper aspect T12 through S3-4. Vertebral body heights and AP alignment are normal. The conus medullaris terminates at L1, within normal limits. Rightward curvature of the lumbar spine is centered at L2. Leftward curvature is centered at L5. Asymmetric right-sided endplate changes and vacuum disc are noted at L4-5.  Minimal atherosclerotic changes are present in the aorta without aneurysm.  L1-2: Moderate facet hypertrophy is present. There is no significant stenosis.  L2-3: Moderate facet hypertrophy is worse on the right. There is no significant central disc protrusion or stenosis.  L3-4: Advanced facet hypertrophy is present. Moderate ligamentum flavum thickening is noted. This leads to mild lateral recess narrowing bilaterally. Facet spurring contributes to mild to moderate foraminal stenosis, worse on the left.  L4-5: The patient is status post right laminectomy. Residual or recurrent osseous right lateral recess narrowing is present, potentially affecting the right L5 nerve root. Facet  spurring and endplate degenerative changes result in moderate right and mild left foraminal stenosis.  L5-S1: Moderate facet arthropathy is present bilaterally with spurring. Mild endplate spurring is present on the right. This results in moderate right foraminal and mild right lateral recess narrowing.  IMPRESSION: LUMBAR MYELOGRAM IMPRESSION:  1. Moderate central canal stenosis at L3-4 without significant exacerbation with standing. 2. No abnormal motion with flexion or extension. 3. Mild lateral recess narrowing or postsurgical change on the right at L4-5.  CT LUMBAR MYELOGRAM IMPRESSION:  1. Multilevel facet  hypertrophy throughout the lumbar spine. 2. Ligamentum flavum thickening and a mild broad-based disc protrusion at L3-4 along with advanced facet hypertrophy results in mild to moderate foraminal stenosis, worse on the left. 3. Postoperative changes of right laminectomy at L4-5 with residual recurrent osseous right lateral recess narrowing potentially affecting the right L5 nerve root. 4. Moderate right and mild left foraminal stenosis at L4-5 secondary to endplate change and facet spurring. 5. Moderate facet arthropathy and endplate spurring at H4-R7 with moderate right foraminal and mild right lateral recess narrowing.   Electronically Signed   By: Lawrence Santiago M.D.   On: 03/06/2014 13:32   Dg Knee Ap/lat W/sunrise Left  03/22/2014   CLINICAL DATA:  Left knee pain post fall.  EXAM: DG KNEE - 3 VIEWS  COMPARISON:  None.  FINDINGS: Exam demonstrates mild tricompartmental degenerative changes. There is no acute fracture, dislocation or significant joint effusion. There is mild diffuse decreased bone mineralization.  IMPRESSION: No acute findings.  Mild degenerative changes.   Electronically Signed   By: Marin Olp M.D.   On: 03/22/2014 14:07    ASSESSMENT:  Moderate osteoarthritis,  Mild free edge degeneration of the lateral meniscus         Active Problems:   Lumbar radiculopathy    PLAN: Patient given a steroid injection this am.  Patient was prepped with alcohol in the lateral joint line of the left knee, then given 2 cc of celestone 30/5 mg and 8 cc of marcaine .50 % with a 21 g needle.  Patient tolerated the procedure.  Okay to discharge from an ortho standpoint.  Follow up with Dr Ruel Favors office in 3 weeks   [facility] 03/23/2014. 11:50 AM

## 2014-03-23 NOTE — Discharge Summary (Signed)
Physician Discharge Summary  Patient ID: [name] MRN: [id number] DOB/AGE: Jan 12, 1933 80 y.o.  Admit date: 03/20/2014 Discharge date: 03/23/2014  Admission Diagnoses:lumbar radiculopathy  Discharge Diagnoses:  Active Problems:   Lumbar radiculopathy left knee arthropathy  Discharged Condition: no knee pain  Hospital Course: pain control. Mri   Consults:orthopedics Significant Diagnostic Studies: mri lumbar, knee  Treatments:pain control. Knee infiltration by ortho  Discharge Exam: Blood pressure 150/66, pulse 72, temperature 97.8 F (36.6 C), temperature source Oral, resp. rate 18, height 5\' 9"  (1.753 m), weight 260 lb 12.9 oz (118.3 kg), SpO2 99.00%. Some drainage from wound. No pain in left knee  Disposition: home     Medication List    ASK your doctor about these medications       acetaminophen 500 MG tablet  Commonly known as:  TYLENOL  Take 500 mg by mouth 3 (three) times daily. Takes with gabapentin.     chlorthalidone 25 MG tablet  Commonly known as:  HYGROTON  Take 25 mg by mouth daily.     cloNIDine 0.1 MG tablet  Commonly known as:  CATAPRES  Take 0.1 mg by mouth 2 (two) times daily.     gabapentin 300 MG capsule  Commonly known as:  NEURONTIN  Take 300 mg by mouth 3 (three) times daily.     insulin glargine 100 UNIT/ML injection  Commonly known as:  LANTUS  Inject 65 Units into the skin at bedtime.     losartan 100 MG tablet  Commonly known as:  COZAAR  Take 100 mg by mouth daily.     metFORMIN 1000 MG tablet  Commonly known as:  GLUCOPHAGE  Take 1,000 mg by mouth daily with breakfast.     methocarbamol 500 MG tablet  Commonly known as:  ROBAXIN  Take 500 mg by mouth every 6 (six) hours as needed for muscle spasms.     nitrofurantoin (macrocrystal-monohydrate) 100 MG capsule  Commonly known as:  MACROBID  Take 100 mg by mouth 2 (two) times daily.     oxyCODONE-acetaminophen 5-325 MG per tablet  Commonly known as:  PERCOCET/ROXICET   Take 2 tablets by mouth every 6 (six) hours as needed for severe pain.     pravastatin 40 MG tablet  Commonly known as:  PRAVACHOL  Take 40 mg by mouth at bedtime.     propranolol 10 MG tablet  Commonly known as:  INDERAL  Take 20 mg by mouth 2 (two) times daily.     TRADJENTA 5 MG Tabs tablet  Generic drug:  linagliptin  Take 5 mg by mouth daily.           Follow-up Information   Follow up with LUCEY,STEPHEN D, MD. Call in 3 weeks.   Specialty:  Orthopedic Surgery   Contact information:   [place] [address] [phone]       Signed: [name] 03/23/2014, 3:06 PM

## 2014-03-26 LAB — WOUND CULTURE: Gram Stain: NONE SEEN

## 2014-03-29 MED ORDER — FENTANYL CITRATE 0.05 MG/ML IJ SOLN
INTRAMUSCULAR | Status: DC | PRN
  Administered 2014-03-16: 125 ug via INTRAVENOUS

## 2014-03-29 NOTE — Addendum Note (Signed)
Addendum created 03/29/14 1345 by Clearnce Sorrel, CRNA   Modules edited: Anesthesia Medication Administration

## 2014-05-03 ENCOUNTER — Encounter (HOSPITAL_COMMUNITY): Payer: Self-pay | Admitting: Emergency Medicine

## 2014-05-03 ENCOUNTER — Emergency Department (HOSPITAL_COMMUNITY)
Admission: EM | Admit: 2014-05-03 | Discharge: 2014-05-03 | Disposition: A | Discharge status: Home / Self Care | Payer: Medicare Other | Attending: Emergency Medicine | Admitting: Emergency Medicine

## 2014-05-03 DIAGNOSIS — I10 Essential (primary) hypertension: Secondary | ICD-10-CM | POA: Insufficient documentation

## 2014-05-03 DIAGNOSIS — E669 Obesity, unspecified: Secondary | ICD-10-CM | POA: Insufficient documentation

## 2014-05-03 DIAGNOSIS — Z8781 Personal history of (healed) traumatic fracture: Secondary | ICD-10-CM | POA: Insufficient documentation

## 2014-05-03 DIAGNOSIS — M129 Arthropathy, unspecified: Secondary | ICD-10-CM | POA: Diagnosis not present

## 2014-05-03 DIAGNOSIS — M51379 Other intervertebral disc degeneration, lumbosacral region without mention of lumbar back pain or lower extremity pain: Secondary | ICD-10-CM | POA: Insufficient documentation

## 2014-05-03 DIAGNOSIS — E1142 Type 2 diabetes mellitus with diabetic polyneuropathy: Secondary | ICD-10-CM | POA: Insufficient documentation

## 2014-05-03 DIAGNOSIS — E1149 Type 2 diabetes mellitus with other diabetic neurological complication: Secondary | ICD-10-CM | POA: Diagnosis not present

## 2014-05-03 DIAGNOSIS — M5137 Other intervertebral disc degeneration, lumbosacral region: Secondary | ICD-10-CM | POA: Diagnosis not present

## 2014-05-03 DIAGNOSIS — Z8719 Personal history of other diseases of the digestive system: Secondary | ICD-10-CM | POA: Insufficient documentation

## 2014-05-03 DIAGNOSIS — Z87440 Personal history of urinary (tract) infections: Secondary | ICD-10-CM | POA: Insufficient documentation

## 2014-05-03 DIAGNOSIS — Z85828 Personal history of other malignant neoplasm of skin: Secondary | ICD-10-CM | POA: Diagnosis not present

## 2014-05-03 DIAGNOSIS — Z79899 Other long term (current) drug therapy: Secondary | ICD-10-CM | POA: Insufficient documentation

## 2014-05-03 DIAGNOSIS — G8929 Other chronic pain: Secondary | ICD-10-CM | POA: Diagnosis not present

## 2014-05-03 DIAGNOSIS — B372 Candidiasis of skin and nail: Secondary | ICD-10-CM | POA: Diagnosis not present

## 2014-05-03 DIAGNOSIS — E785 Hyperlipidemia, unspecified: Secondary | ICD-10-CM | POA: Diagnosis not present

## 2014-05-03 DIAGNOSIS — R112 Nausea with vomiting, unspecified: Secondary | ICD-10-CM | POA: Insufficient documentation

## 2014-05-03 DIAGNOSIS — Z794 Long term (current) use of insulin: Secondary | ICD-10-CM | POA: Insufficient documentation

## 2014-05-03 DIAGNOSIS — R110 Nausea: Secondary | ICD-10-CM

## 2014-05-03 LAB — URINALYSIS, ROUTINE W REFLEX MICROSCOPIC
Bilirubin Urine: NEGATIVE
Glucose, UA: 500 mg/dL — AB
Hgb urine dipstick: NEGATIVE
Ketones, ur: NEGATIVE mg/dL
Leukocytes, UA: NEGATIVE
Nitrite: NEGATIVE
Protein, ur: NEGATIVE mg/dL
Specific Gravity, Urine: 1.015 (ref 1.005–1.030)
Urobilinogen, UA: 0.2 mg/dL (ref 0.0–1.0)
pH: 5 (ref 5.0–8.0)

## 2014-05-03 LAB — BASIC METABOLIC PANEL
Anion gap: 14 (ref 5–15)
BUN: 26 mg/dL — ABNORMAL HIGH (ref 6–23)
CO2: 22 mEq/L (ref 19–32)
Calcium: 9.2 mg/dL (ref 8.4–10.5)
Chloride: 99 mEq/L (ref 96–112)
Creatinine, Ser: 0.89 mg/dL (ref 0.50–1.10)
GFR calc Af Amer: 70 mL/min — ABNORMAL LOW (ref >90)
GFR calc non Af Amer: 60 mL/min — ABNORMAL LOW (ref >90)
Glucose, Bld: 219 mg/dL — ABNORMAL HIGH (ref 70–99)
Potassium: 4.5 mEq/L (ref 3.7–5.3)
Sodium: 135 mEq/L — ABNORMAL LOW (ref 137–147)

## 2014-05-03 LAB — CBC WITH DIFFERENTIAL/PLATELET
Basophils Absolute: 0 10*3/uL (ref 0.0–0.1)
Basophils Relative: 0 % (ref 0–1)
Eosinophils Absolute: 0 10*3/uL (ref 0.0–0.7)
Eosinophils Relative: 1 % (ref 0–5)
HCT: 34 % — ABNORMAL LOW (ref 36.0–46.0)
Hemoglobin: 11.2 g/dL — ABNORMAL LOW (ref 12.0–15.0)
Lymphocytes Relative: 13 % (ref 12–46)
Lymphs Abs: 1.1 10*3/uL (ref 0.7–4.0)
MCH: 27.3 pg (ref 26.0–34.0)
MCHC: 32.9 g/dL (ref 30.0–36.0)
MCV: 82.9 fL (ref 78.0–100.0)
Monocytes Absolute: 0.3 10*3/uL (ref 0.1–1.0)
Monocytes Relative: 4 % (ref 3–12)
Neutro Abs: 7.2 10*3/uL (ref 1.7–7.7)
Neutrophils Relative %: 82 % — ABNORMAL HIGH (ref 43–77)
Platelets: 216 10*3/uL (ref 150–400)
RBC: 4.1 MIL/uL (ref 3.87–5.11)
RDW: 13.4 % (ref 11.5–15.5)
WBC: 8.7 10*3/uL (ref 4.0–10.5)

## 2014-05-03 MED ORDER — ONDANSETRON 4 MG PO TBDP
4.0000 mg | ORAL_TABLET | Freq: Once | ORAL | Status: AC
  Administered 2014-05-03: 4 mg via ORAL
  Filled 2014-05-03: qty 1

## 2014-05-03 MED ORDER — NYSTATIN 100000 UNIT/GM EX CREA: TOPICAL_CREAM | CUTANEOUS | Status: DC

## 2014-05-03 MED ORDER — ONDANSETRON 4 MG PO TBDP: 4.0000 mg | ORAL_TABLET | ORAL | Status: DC | PRN

## 2014-05-03 MED ORDER — PROMETHAZINE HCL 25 MG PO TABS: 25.0000 mg | ORAL_TABLET | Freq: Four times a day (QID) | ORAL | Status: DC | PRN

## 2014-05-03 NOTE — ED Notes (Signed)
Pt reports nausea, vomited 1 last night. Reports she has seen primary care doctor for skin break down on skin fold of abdomen, but that breakdown has gotten worse. Bloody spots on stomach, pt reports she has been putting neosporin on wound. Reports pain 5/10. Pt requesting to get creatinine level checked.

## 2014-05-03 NOTE — ED Notes (Signed)
Pt c/o nausea starting last night.  Sts she attempted to eat crackers and drink ginger, but provided no relief.  Emesis x 1 last night.  Denies diarrhea.

## 2014-05-03 NOTE — ED Notes (Signed)
Pt escorted to discharge window. Verbalized understanding discharge instructions. In no acute distress.   

## 2014-05-03 NOTE — ED Provider Notes (Signed)
CSN: [id number]     Arrival date & time 05/03/14  0820 History   First MD Initiated Contact with Patient 05/03/14 0848     Chief Complaint  Patient presents with  . abd skin breakdown   . Emesis     (Consider location/radiation/quality/duration/timing/severity/associated sxs/prior Treatment) HPI This 80 year old female presents with 2 chief complaints. First chief complaint is regarding rash and breakdown on her abdomen the patient had a similar episode approximately 2 months ago and was treated with a prescription medications that relieved the symptoms. She was seen at her family practice office last week and inform them that she was putting Neosporin on the rash. She reports she was told to continue applying Neosporin the rash has worsened and become increasingly red as well as getting a little bit of bloody drainage. It's painful and burning in quality The patient's second complaint is for nausea. The patient reports that she felt nauseated all night. She does not have associated abdominal pain or diarrhea or fever or chills. The patient reports that she is concerned because she had a urinary tract infection last months ago at which point in time her creatinine elevated and she went into renal failure failure.  Past Medical History  Diagnosis Date  . Hypertension   . Hyperlipemia   . Arthritis   . Cancer     skin  . Staph infection     1970's  . Essential and other specified forms of tremor 05/03/2013  . Cholelithiasis   . Chronic insomnia   . Chronic low back pain   . Obesity   . Peripheral edema   . Wrist fracture     left October 2014  . Complication of anesthesia   . PONV (postoperative nausea and vomiting)   . Hx of acute renal failure 02/2014    admitted to [facility]  . DDD (degenerative disc disease), lumbar   . Hx: UTI (urinary tract infection) 02/2014  . Diabetes mellitus     Iddm x 12 years  . Diabetic peripheral neuropathy    Past Surgical History  Procedure  Laterality Date  . Abdominal hysterectomy    . Hernia repair    . Total knee arthroplasty    . Cataract extraction    . Oophorectomy    . Neck surgery    . Cardiac catheterization  2000    Dr Einar Gip  . Back surgery  2005  . Eye surgery    . Colonoscopy  >10 years  . Lumbar laminectomy/decompression microdiscectomy Left 03/16/2014    Procedure: LUMBAR LAMINECTOMY/DECOMPRESSION MICRODISCECTOMY 1 LEVEL  lumbar level three/four;  Surgeon: [name], MD;  Location: [place];  Service: Neurosurgery;  Laterality: Left;   Family History  Problem Relation Age of Onset  . Cancer Brother     prostate  . Heart disease Brother   . Alzheimer's disease Brother   . Stroke Mother   . Hypertension Mother   . COPD Brother   . Cancer Brother     Prostate cancer  . Hypertension Sister     Congestive heart failure   History  Substance Use Topics  . Smoking status: Never Smoker   . Smokeless tobacco: Never Used  . Alcohol Use: No   OB History   Grav Para Term Preterm Abortions TAB SAB Ect Mult Living                 Review of Systems  Constitutional: Negative for fever and chills.  HENT: Negative  for congestion and sore throat.   Respiratory: Negative for cough and shortness of breath.   Cardiovascular: Negative for chest pain and leg swelling.  Gastrointestinal: Positive for nausea. Negative for vomiting, abdominal pain, diarrhea and abdominal distention.  Genitourinary: Negative for dysuria and difficulty urinating.  Musculoskeletal: Negative for arthralgias and myalgias.  Skin: Positive for rash. Negative for color change.  Neurological: Negative for dizziness, weakness and headaches.  Psychiatric/Behavioral: Negative for confusion and agitation.      Allergies  Codeine; Dilaudid; Ciprofloxacin; and Mysoline  Home Medications   Prior to Admission medications   Medication Sig Start Date End Date Taking? Authorizing Provider  acetaminophen (TYLENOL) 500 MG tablet Take 500 mg  by mouth 3 (three) times daily. Takes with gabapentin.    Historical Provider, MD  chlorthalidone (HYGROTON) 25 MG tablet Take 25 mg by mouth daily.  01/23/14   Historical Provider, MD  cloNIDine (CATAPRES) 0.1 MG tablet Take 0.1 mg by mouth 2 (two) times daily.      Historical Provider, MD  gabapentin (NEURONTIN) 300 MG capsule Take 300 mg by mouth 3 (three) times daily.    Historical Provider, MD  insulin glargine (LANTUS) 100 UNIT/ML injection Inject 65 Units into the skin at bedtime.    Historical Provider, MD  losartan (COZAAR) 100 MG tablet Take 100 mg by mouth daily. 03/01/14   Historical Provider, MD  metFORMIN (GLUCOPHAGE) 1000 MG tablet Take 1,000 mg by mouth daily with breakfast.     Historical Provider, MD  methocarbamol (ROBAXIN) 500 MG tablet Take 500 mg by mouth every 6 (six) hours as needed for muscle spasms.    Historical Provider, MD  nitrofurantoin, macrocrystal-monohydrate, (MACROBID) 100 MG capsule Take 100 mg by mouth 2 (two) times daily.    Historical Provider, MD  oxyCODONE-acetaminophen (PERCOCET/ROXICET) 5-325 MG per tablet Take 2 tablets by mouth every 6 (six) hours as needed for severe pain.    Historical Provider, MD  pravastatin (PRAVACHOL) 40 MG tablet Take 40 mg by mouth at bedtime.  01/15/14   Historical Provider, MD  propranolol (INDERAL) 10 MG tablet Take 20 mg by mouth 2 (two) times daily.  01/16/14   Dennie Bible, NP  TRADJENTA 5 MG TABS tablet Take 5 mg by mouth daily. 07/24/13   Historical Provider, MD   BP 159/67  Pulse 72  Temp(Src) 97.9 F (36.6 C) (Oral)  Resp 20  SpO2 100% Physical Exam  Constitutional: She is oriented to person, place, and time. She appears well-developed and well-nourished.  HENT:  Head: Normocephalic and atraumatic.  Eyes: EOM are normal. Pupils are equal, round, and reactive to light.  Neck: Neck supple.  Cardiovascular: Normal rate, regular rhythm, normal heart sounds and intact distal pulses.   Pulmonary/Chest: Effort  normal and breath sounds normal.  Abdominal: Soft. Bowel sounds are normal. She exhibits no distension. There is no tenderness.  Musculoskeletal: Normal range of motion. She exhibits no edema.  Neurological: She is alert and oriented to person, place, and time. She has normal strength. Coordination normal. GCS eye subscore is 4. GCS verbal subscore is 5. GCS motor subscore is 6.  Skin: Skin is warm, dry and intact.  The patient has a rash within the skin fold of her lower abdomen as well as in the intertriginous folds of the groin. It is erythematous and macerated there is no purulent discharge associated findings are classic for candidal skin fold infection remainder of the skin of the abdomen is in excellent condition this is  limited to the intertriginous folds of the lower abdominal pannus.  Psychiatric: She has a normal mood and affect.    ED Course  Procedures (including critical care time) Labs Review Labs Reviewed  URINALYSIS, ROUTINE W REFLEX MICROSCOPIC - Abnormal; Notable for the following:    Glucose, UA 500 (*)    All other components within normal limits  BASIC METABOLIC PANEL - Abnormal; Notable for the following:    Sodium 135 (*)    Glucose, Bld 219 (*)    BUN 26 (*)    GFR calc non Af Amer 60 (*)    GFR calc Af Amer 70 (*)    All other components within normal limits  CBC WITH DIFFERENTIAL - Abnormal; Notable for the following:    Hemoglobin 11.2 (*)    HCT 34.0 (*)    Neutrophils Relative % 82 (*)    All other components within normal limits    Imaging Review No results found.   EKG Interpretation None      MDM   Final diagnoses:  Candidal intertrigo  Nausea   This 80 year old female has a classic candidal intertriginous rash is uncomplicated by panniculitis or reproducible abdominal pain. She also has nausea however does not have any fever chills nor any abdominal pain associated with this. Does not have chest pain shortness of breath or other  constitutional symptoms. Her general appearance as well as labs are within normal limits today. I feel she is safe for treatment of her candidal rash as well as PRN Zofran. The patient specifically requests Phenergan she was at work better for her I did advise that I had concerns with sedation and cessation of Phenergan but she has used in the past and reports it is her preferred medication for nausea.    Charlesetta Shanks, MD 05/03/14 1041

## 2014-05-04 NOTE — Telephone Encounter (Signed)
noted 

## 2014-05-16 NOTE — ED Notes (Signed)
Contacted by [facility], [place] that insurance will not cover Zofran and patient unable to purchase.  Per Dr. Johnney Killian, give Phenergan 25 mg 1 tab PO Q4-6 hours for nausea, #20.  Called to [facility]

## 2014-06-21 ENCOUNTER — Other Ambulatory Visit: Payer: Self-pay

## 2014-06-21 MED ORDER — PROPRANOLOL HCL 10 MG PO TABS: 20.0000 mg | ORAL_TABLET | Freq: Two times a day (BID) | ORAL | Status: DC

## 2014-06-21 NOTE — Telephone Encounter (Signed)
Pharmacy requests 90 day Rx  

## 2014-07-19 ENCOUNTER — Ambulatory Visit: Payer: Self-pay | Admitting: Nurse Practitioner

## 2014-07-25 ENCOUNTER — Encounter: Payer: Self-pay | Admitting: Neurology

## 2014-07-31 ENCOUNTER — Encounter: Payer: Self-pay | Admitting: Neurology

## 2014-09-10 ENCOUNTER — Encounter: Payer: Self-pay | Admitting: Adult Health

## 2014-09-10 ENCOUNTER — Ambulatory Visit (INDEPENDENT_AMBULATORY_CARE_PROVIDER_SITE_OTHER): Payer: Medicare Other | Admitting: Adult Health

## 2014-09-10 VITALS — BP 162/72 | HR 78 | Ht 69.5 in | Wt 246.6 lb

## 2014-09-10 DIAGNOSIS — G250 Essential tremor: Secondary | ICD-10-CM

## 2014-09-10 DIAGNOSIS — R269 Unspecified abnormalities of gait and mobility: Secondary | ICD-10-CM

## 2014-09-10 MED ORDER — PROPRANOLOL HCL 10 MG PO TABS: 20.0000 mg | ORAL_TABLET | Freq: Two times a day (BID) | ORAL | Status: DC

## 2014-09-10 NOTE — Progress Notes (Signed)
PATIENT: [name] DOB: 01-12-1933  REASON FOR VISIT: follow up HISTORY FROM: patient  HISTORY OF PRESENT ILLNESS: [name] is a 81 year old female with a history of essential tremor. She returns today for follow-up. She is currently taking propranolol 20 mg BID. She reports that her tremor has improved in the hands with the tremor. She states that at night if she lays on her left side her jaw will tremor. She states that this only happens occasionally. She reports that her balance has gotten a little worse. She states that if she makes a quick turn she will lose her balance. She states that sometimes when she walks she will veer off to the right. She started using her walker more often. She states that she fell back in June. She states that she was on CIPRO for a UTI and fell while ambulating with the walker in the bathroom. She was taken to the hospital and was admitted for acute Renal Failure. She states that she had back surgery July 10th.   HISTORY 01/16/14 (CM): [name], 81 year old female returns for followup. She was last seen by Dr. Jannifer Franklin 08/23/2013. She has a history of an essential tremor. She also has a vocal tremor. She was placed on low-dose Xanax at that time and had CT of the head which was normal. She called Dr. Jannifer Franklin in February due to the tremor worsening and she was placed on propanolol . She called back in April saying that she thought she had gotten a rash from the propanolol and so the medication was stopped however she still has the rash. She wants to be started back on the propanolol as it was very beneficial for her tremor. She has failed Mysoline in the past. She returns for reevaluation  HISTORY:of an essential tremor. The patient has a vocal tremor, and tremor in both upper extremities, possibly worse on the right side. The patient has noted onset of a jaw tremor that generally occurs at night, and for some reason tends to occur only when she is laying on her  left side. The patient indicates that the tremor may wake her up at night. The tremor she claims it is violent, and it will go away when she sits up. The patient does not have a jaw tremor during the day. The patient does have some mild gait instability, and she fell in October 2014 with a left wrist fracture that did not require surgery. The patient has recovered from this. The patient was given a trial on Mysoline when she was seen last through this office, but she did not tolerate this medication. The patient is on no medications for her tremor at this time.   REVIEW OF SYSTEMS: Out of a complete 14 system review of symptoms, the patient complains only of the following symptoms, and all other reviewed systems are negative.  Runny nose, walking difficulty  ALLERGIES: Allergies  Allergen Reactions  . Codeine Nausea And Vomiting  . Dilaudid [Hydromorphone Hcl] Nausea And Vomiting and Other (See Comments)    Severe sweating  . Ciprofloxacin Other (See Comments)    Acute kidney failure  . Mysoline [Primidone] Nausea Only    HOME MEDICATIONS: Outpatient Prescriptions Prior to Visit  Medication Sig Dispense Refill  . chlorthalidone (HYGROTON) 25 MG tablet Take 25 mg by mouth daily.     . cloNIDine (CATAPRES) 0.1 MG tablet Take 0.1 mg by mouth 2 (two) times daily.      [name] gabapentin (  NEURONTIN) 300 MG capsule Take 300 mg by mouth 2 (two) times daily as needed (for nerve pain).     . insulin glargine (LANTUS) 100 UNIT/ML injection Inject 65 Units into the skin at bedtime.    [name] losartan (COZAAR) 100 MG tablet Take 100 mg by mouth daily.    . metFORMIN (GLUCOPHAGE) 1000 MG tablet Take 1,000 mg by mouth 2 (two) times daily with a meal.     . methocarbamol (ROBAXIN) 500 MG tablet Take 500 mg by mouth every 6 (six) hours as needed for muscle spasms.    [name] nystatin cream (MYCOSTATIN) Apply to affected area 2 times daily 30 g 2  . ondansetron (ZOFRAN ODT) 4 MG disintegrating tablet Take 1 tablet (4 mg  total) by mouth every 4 (four) hours as needed for nausea or vomiting. 20 tablet 0  . oxyCODONE-acetaminophen (PERCOCET/ROXICET) 5-325 MG per tablet Take 2 tablets by mouth every 6 (six) hours as needed for severe pain.    . pneumococcal 13-valent conjugate vaccine (PREVNAR 13) SUSP injection Inject 0.5 mLs into the muscle once.    . pravastatin (PRAVACHOL) 40 MG tablet Take 40 mg by mouth at bedtime.     . promethazine (PHENERGAN) 25 MG tablet Take 1 tablet (25 mg total) by mouth every 6 (six) hours as needed for nausea or vomiting. 20 tablet 0  . propranolol (INDERAL) 10 MG tablet Take 2 tablets (20 mg total) by mouth 2 (two) times daily. 360 tablet 0  . TRADJENTA 5 MG TABS tablet Take 5 mg by mouth daily.     No facility-administered medications prior to visit.    PAST MEDICAL HISTORY: Past Medical History  Diagnosis Date  . Hypertension   . Hyperlipemia   . Arthritis   . Cancer     skin  . Staph infection     1970's  . Essential and other specified forms of tremor 05/03/2013  . Cholelithiasis   . Chronic insomnia   . Chronic low back pain   . Obesity   . Peripheral edema   . Wrist fracture     left October 2014  . Complication of anesthesia   . PONV (postoperative nausea and vomiting)   . Hx of acute renal failure 02/2014    admitted to [facility]  . DDD (degenerative disc disease), lumbar   . Hx: UTI (urinary tract infection) 02/2014  . Diabetes mellitus     Iddm x 12 years  . Diabetic peripheral neuropathy     PAST SURGICAL HISTORY: Past Surgical History  Procedure Laterality Date  . Abdominal hysterectomy    . Hernia repair    . Total knee arthroplasty    . Cataract extraction    . Oophorectomy    . Neck surgery    . Cardiac catheterization  2000    Dr Einar Gip  . Back surgery  2005  . Eye surgery    . Colonoscopy  >10 years  . Lumbar laminectomy/decompression microdiscectomy Left 03/16/2014    Procedure: LUMBAR LAMINECTOMY/DECOMPRESSION MICRODISCECTOMY 1 LEVEL   lumbar level three/four;  Surgeon: [name], MD;  Location: [place];  Service: Neurosurgery;  Laterality: Left;    FAMILY HISTORY: Family History  Problem Relation Age of Onset  . Cancer Brother     prostate  . Heart disease Brother   . Alzheimer's disease Brother   . Stroke Mother   . Hypertension Mother   . COPD Brother   . Cancer Brother  Prostate cancer  . Hypertension Sister     Congestive heart failure    SOCIAL HISTORY: History   Social History  . Marital Status: Widowed    Spouse Name: N/A    Number of Children: 3  . Years of Education: 16   Occupational History  . Retired     Social History Main Topics  . Smoking status: Never Smoker   . Smokeless tobacco: Never Used  . Alcohol Use: No  . Drug Use: No  . Sexual Activity: No   Other Topics Concern  . Not on file   Social History Narrative   Patient lives at home alone.    Patient is widowed.    Patient has 3 children and 2 step children.    Patient is right handed.    Patient has a high school education and CNA certificate.     Patient is retired.       PHYSICAL EXAM  Filed Vitals:   09/10/14 1004  BP: 162/72  Pulse: 78  Height: 5' 9.5" (1.765 m)  Weight: 246 lb 9.6 oz (111.857 kg)   Body mass index is 35.91 kg/(m^2).  Generalized: Well developed, in no acute distress   Neurological examination  Mentation: Alert oriented to time, place, history taking. Follows all commands speech and language fluent Cranial nerve II-XII: Pupils were equal round reactive to light. Extraocular movements were full, visual field were full on confrontational test. Facial sensation and strength were normal. Uvula tongue midline. Head turning and shoulder shrug  were normal and symmetric. Motor: The motor testing reveals 5 over 5 strength of all 4 extremities. Good symmetric motor tone is noted throughout.  Sensory: Sensory testing is intact to soft touch on all 4 extremities. No evidence of extinction  is noted.  Coordination: Cerebellar testing reveals good finger-nose-finger and heel-to-shin bilaterally.  Gait and station: Gait is normal but slow and cautious with decreased arm swing. She is unable to stand from a sitting position without assistance. Tandem gait not attempted. Romberg is negative. No drift is seen.  Reflexes: Deep tendon reflexes are symmetric but decreased throughout    DIAGNOSTIC DATA (LABS, IMAGING, TESTING) - I reviewed patient records, labs, notes, testing and imaging myself where available.  Lab Results  Component Value Date   WBC 8.7 05/03/2014   HGB 11.2* 05/03/2014   HCT 34.0* 05/03/2014   MCV 82.9 05/03/2014   PLT 216 05/03/2014      Component Value Date/Time   NA 135* 05/03/2014 0928   K 4.5 05/03/2014 0928   CL 99 05/03/2014 0928   CO2 22 05/03/2014 0928   GLUCOSE 219* 05/03/2014 0928   BUN 26* 05/03/2014 0928   CREATININE 0.89 05/03/2014 0928   CALCIUM 9.2 05/03/2014 0928   PROT 6.9 03/17/2014 2341   ALBUMIN 3.4* 03/17/2014 2341   AST 21 03/17/2014 2341   ALT 11 03/17/2014 2341   ALKPHOS 62 03/17/2014 2341   BILITOT 0.4 03/17/2014 2341   GFRNONAA 60* 05/03/2014 0928   GFRAA 70* 05/03/2014 0928   No results found for: CHOL, HDL, LDLCALC, LDLDIRECT, TRIG, CHOLHDL Lab Results  Component Value Date   HGBA1C 7.1* 03/18/2014   Lab Results  Component Value Date   VITAMINB12 304 02/12/2014   Lab Results  Component Value Date   TSH 3.399 05/01/2011      ASSESSMENT AND PLAN 81 y.o. year old female  has a past medical history of Hypertension; Hyperlipemia; Arthritis; Cancer; Staph infection; Essential and other specified forms  of tremor (05/03/2013); Cholelithiasis; Chronic insomnia; Chronic low back pain; Obesity; Peripheral edema; Wrist fracture; Complication of anesthesia; PONV (postoperative nausea and vomiting); acute renal failure (02/2014); DDD (degenerative disc disease), lumbar; UTI (urinary tract infection) (02/2014); Diabetes  mellitus; and Diabetic peripheral neuropathy. here with:  1. Tremor 2. Abnormality of gait  Overall the patient has remained the same. She states that the propranolol has been beneficial for her tremor. I will refill the propranolol today. The patient states that her gait  and balance may have gotten slightly worse. I have encouraged the patient to use a walker at all times. Patient verbalized understanding. If her symptoms worsen or she develops new symptoms she will let us know. Otherwise she will follow up in 6 months.  Ward Givens, MSN, NP-C 09/10/2014, 10:01 AM [facility] [address] [place], [place] [address] [phone]  Note: This document was prepared with digital dictation and possible smart phrase technology. Any transcriptional errors that result from this process are unintentional.

## 2014-09-10 NOTE — Patient Instructions (Signed)
Continue taking Propranolol 20 mg daily.  Use your walker when ambulating.  If your symptoms worsen or you develop new symptoms please let us know.

## 2014-09-10 NOTE — Progress Notes (Signed)
I have read the note, and I agree with the clinical assessment and plan.  [name]   

## 2014-12-23 ENCOUNTER — Other Ambulatory Visit: Payer: Self-pay | Admitting: Neurology

## 2015-01-15 ENCOUNTER — Other Ambulatory Visit: Payer: Self-pay | Admitting: Neurosurgery

## 2015-01-15 DIAGNOSIS — M5136 Other intervertebral disc degeneration, lumbar region: Secondary | ICD-10-CM

## 2015-01-23 ENCOUNTER — Ambulatory Visit
Admission: RE | Admit: 2015-01-23 | Discharge: 2015-01-23 | Disposition: A | Discharge status: Home / Self Care | Payer: Medicare Other | Source: Ambulatory Visit | Attending: Neurosurgery | Admitting: Neurosurgery

## 2015-01-23 DIAGNOSIS — M5136 Other intervertebral disc degeneration, lumbar region: Secondary | ICD-10-CM

## 2015-01-23 MED ORDER — GADOBENATE DIMEGLUMINE 529 MG/ML IV SOLN
10.0000 mL | Freq: Once | INTRAVENOUS | Status: AC | PRN
  Administered 2015-01-23: 10 mL via INTRAVENOUS

## 2015-01-28 ENCOUNTER — Other Ambulatory Visit: Payer: Self-pay | Admitting: Neurosurgery

## 2015-01-31 ENCOUNTER — Other Ambulatory Visit (HOSPITAL_COMMUNITY): Payer: Self-pay | Admitting: *Deleted

## 2015-01-31 ENCOUNTER — Encounter (HOSPITAL_COMMUNITY): Payer: Self-pay

## 2015-01-31 ENCOUNTER — Encounter (HOSPITAL_COMMUNITY)
Admission: RE | Admit: 2015-01-31 | Discharge: 2015-01-31 | Disposition: A | Discharge status: Home / Self Care | Payer: Medicare Other | Source: Ambulatory Visit | Attending: Neurosurgery | Admitting: Neurosurgery

## 2015-01-31 DIAGNOSIS — Z794 Long term (current) use of insulin: Secondary | ICD-10-CM | POA: Diagnosis not present

## 2015-01-31 DIAGNOSIS — G4700 Insomnia, unspecified: Secondary | ICD-10-CM | POA: Diagnosis not present

## 2015-01-31 DIAGNOSIS — Z01812 Encounter for preprocedural laboratory examination: Secondary | ICD-10-CM | POA: Diagnosis not present

## 2015-01-31 DIAGNOSIS — Z79899 Other long term (current) drug therapy: Secondary | ICD-10-CM | POA: Diagnosis not present

## 2015-01-31 DIAGNOSIS — G250 Essential tremor: Secondary | ICD-10-CM | POA: Diagnosis not present

## 2015-01-31 DIAGNOSIS — Z01818 Encounter for other preprocedural examination: Secondary | ICD-10-CM | POA: Diagnosis present

## 2015-01-31 DIAGNOSIS — Z981 Arthrodesis status: Secondary | ICD-10-CM | POA: Diagnosis not present

## 2015-01-31 DIAGNOSIS — K219 Gastro-esophageal reflux disease without esophagitis: Secondary | ICD-10-CM | POA: Diagnosis not present

## 2015-01-31 DIAGNOSIS — I10 Essential (primary) hypertension: Secondary | ICD-10-CM | POA: Diagnosis not present

## 2015-01-31 DIAGNOSIS — E785 Hyperlipidemia, unspecified: Secondary | ICD-10-CM | POA: Diagnosis not present

## 2015-01-31 DIAGNOSIS — E1142 Type 2 diabetes mellitus with diabetic polyneuropathy: Secondary | ICD-10-CM | POA: Insufficient documentation

## 2015-01-31 DIAGNOSIS — Z96651 Presence of right artificial knee joint: Secondary | ICD-10-CM | POA: Insufficient documentation

## 2015-01-31 HISTORY — DX: Anemia, unspecified: D64.9

## 2015-01-31 HISTORY — DX: Umbilical hernia without obstruction or gangrene: K42.9

## 2015-01-31 HISTORY — DX: Gastro-esophageal reflux disease without esophagitis: K21.9

## 2015-01-31 HISTORY — DX: Pneumonia, unspecified organism: J18.9

## 2015-01-31 LAB — CBC
HCT: 35.3 % — ABNORMAL LOW (ref 36.0–46.0)
Hemoglobin: 12 g/dL (ref 12.0–15.0)
MCH: 28.3 pg (ref 26.0–34.0)
MCHC: 34 g/dL (ref 30.0–36.0)
MCV: 83.3 fL (ref 78.0–100.0)
Platelets: 170 10*3/uL (ref 150–400)
RBC: 4.24 MIL/uL (ref 3.87–5.11)
RDW: 14 % (ref 11.5–15.5)
WBC: 5.9 10*3/uL (ref 4.0–10.5)

## 2015-01-31 LAB — SURGICAL PCR SCREEN
MRSA, PCR: NEGATIVE
Staphylococcus aureus: NEGATIVE

## 2015-01-31 LAB — BASIC METABOLIC PANEL
Anion gap: 12 (ref 5–15)
BUN: 31 mg/dL — ABNORMAL HIGH (ref 6–20)
CO2: 22 mmol/L (ref 22–32)
Calcium: 9.1 mg/dL (ref 8.9–10.3)
Chloride: 105 mmol/L (ref 101–111)
Creatinine, Ser: 1.16 mg/dL — ABNORMAL HIGH (ref 0.44–1.00)
GFR calc Af Amer: 50 mL/min — ABNORMAL LOW (ref >60)
GFR calc non Af Amer: 43 mL/min — ABNORMAL LOW (ref >60)
Glucose, Bld: 262 mg/dL — ABNORMAL HIGH (ref 65–99)
Potassium: 4.3 mmol/L (ref 3.5–5.1)
Sodium: 139 mmol/L (ref 135–145)

## 2015-01-31 LAB — GLUCOSE, CAPILLARY: Glucose-Capillary: 258 mg/dL — ABNORMAL HIGH (ref 65–99)

## 2015-01-31 NOTE — Pre-Procedure Instructions (Signed)
[name]  01/31/2015      Your procedure is scheduled on Wednesday, February 06, 2015 at 11:15 AM.   Report to [facility] Entrance "A" Admitting Office at 8:15 AM.   Call this number if you have problems the morning of surgery: [phone]     Remember:  Do not eat food or drink liquids after midnight Tuesday, 02/05/15.  Take these medicines the morning of surgery with A SIP OF WATER: Clonidine (Catapres), Propanolol (Inderal), Gabapentin (Neurontin) - if needed               Do NOT take diabetic medications the morning of surgery (Metformin and Tradjenta)   Do not wear jewelry, make-up or nail polish.  Do not wear lotions, powders, or perfumes.  You may wear deodorant.  Do not shave 48 hours prior to surgery.    Do not bring valuables to the hospital.  [facility] is not responsible for any belongings or valuables.  Contacts, dentures or bridgework may not be worn into surgery.  Leave your suitcase in the car.  After surgery it may be brought to your room.  For patients admitted to the hospital, discharge time will be determined by your treatment team.  Special instructions:  [place] - Preparing for Surgery  Before surgery, you can play an important role.  Because skin is not sterile, your skin needs to be as free of germs as possible.  You can reduce the number of germs on you skin by washing with CHG (chlorahexidine gluconate) soap before surgery.  CHG is an antiseptic cleaner which kills germs and bonds with the skin to continue killing germs even after washing.  Please DO NOT use if you have an allergy to CHG or antibacterial soaps.  If your skin becomes reddened/irritated stop using the CHG and inform your nurse when you arrive at Short Stay.  Do not shave (including legs and underarms) for at least 48 hours prior to the first CHG shower.  You may shave your face.  Please follow these instructions carefully:   1.  Shower with CHG Soap the night before  surgery and the                                morning of Surgery.  2.  If you choose to wash your hair, wash your hair first as usual with your       normal shampoo.  3.  After you shampoo, rinse your hair and body thoroughly to remove the                      Shampoo.  4.  Use CHG as you would any other liquid soap.  You can apply chg directly       to the skin and wash gently with scrungie or a clean washcloth.  5.  Apply the CHG Soap to your body ONLY FROM THE NECK DOWN.        Do not use on open wounds or open sores.  Avoid contact with your eyes, ears, mouth and genitals (private parts).  Wash genitals (private parts) with your normal soap.  6.  Wash thoroughly, paying special attention to the area where your surgery        will be performed.  7.  Thoroughly rinse your body with warm water from the neck down.  8.  DO NOT  shower/wash with your normal soap after using and rinsing off       the CHG Soap.  9.  Pat yourself dry with a clean towel.            10.  Wear clean pajamas.            11.  Place clean sheets on your bed the night of your first shower and do not        sleep with pets.  Day of Surgery  Do not apply any lotions the morning of surgery.  Please wear clean clothes to the hospital.    Please read over the following fact sheets that you were given. Pain Booklet, Coughing and Deep Breathing, MRSA Information and Surgical Site Infection Prevention

## 2015-01-31 NOTE — Progress Notes (Signed)
Pt's CBG today was 258. She states she had forgotten to take her meds this am. Pt states she saw Dr. Jeanann Lewandowsky (endocrinologist) in April and thought she had an A1C done then. I called Dr. Ainsley Spinner office and spoke with the receptionist and she states the last A1C that they have on pt is from November, 2015 and it was 8.8 .   Pt denies any chest pain. States she does have some sob with walking sometimes, states she has a lot of back pain when she walks. She states she had a heart cath done several years ago by Dr. Einar Gip, (?2000) and was told she had "the heart of a young woman". Has not seen Dr. Einar Gip for many years.

## 2015-01-31 NOTE — Progress Notes (Addendum)
Anesthesia Chart Review: Patient is a 81 year old female scheduled for right lumbar 4-5 microdiskectomy on 02/06/15 by Dr. Joya Salm.    History includes non-smoker, HTN, HLD, essential tremor, insomnia, chronic back pain, peripheral edema, acute renal failure 02/2014, DM2 with peripheral neuropathy, GERD, anemia, hysterectomy, right TKA '08, C4-7 fusion '12, post-operative N/V, skin cancer, L3-4 decompression '15, right L4-5 diskectomy '04. BMI is consistent with obesity. PCP is listed as Dr. Milagros Evener. Endocrinologist is Dr. Jeanann Lewandowsky. She is not followed by cardiology, but reported a history of an unremarkable cath ~ 15 years ago by Dr. Einar Gip.  Meds include chlorthalidone, clonidine, Lantus @ HS, losartan, metformin, KCl, pravastatin, propranolol, Tradjenta.   02/11/14 EKG: SR, first degree AVB, low voltage precordial leads, baseline wanderer in lead I.  04/15/06 Echo: - Overall left ventricular systolic function was normal. There were no left ventricular regional wall motion abnormalities. - The aortic valve was mildly calcified. - There was mild mitral annular calcification. - The left atrium was mildly dilated. - The right atrium was mildly dilated.  03/18/14 CXR: No active cardiopulmonary disease.  Preoperative labs noted. Cr 1.16. H/H 12.0/35.3. Non-fasting glucose 262 with CBG was 258, but reported that she had forgotten to take her medications this morning because she had an early MD appointment followed by her PAT visit.  She reported home glucose levels typically not this high, but admits fasting levels can run right up to 200 at times. A1C is still pending. She will get a fasting CBG on arrival. I did advise her that anesthesiologists would like her fasting glucose level < 200 on the day of surgery, and that a significantly elevated glucose could cancel her surgery.  She is typically compliant with her DM regimen.  She knows not to take her oral agents on the morning of  surgery. I encouraged her to check her fasting glucose levels daily and notify Dr.  Ainsley Spinner office if they are consistently > 200 so he could make adjustments to her regimen if needed.  I will also follow-up on her A1C results once available.   If her CBG is acceptable on the day of surgery and otherwise no acute changes then I would anticipate that she could proceed as planned.  George Hugh [facility] Short Stay Center/Anesthesiology Phone [phone] 01/31/2015 5:29 PM  Addendum: A1C came back at 10.0. I notified Jessica at Dr. Harley Hallmark office and asked that she ensure he was aware that patient's DM is currently poorly controlled.  I also notified her of the instructions I gave to patient yesterday that surgery could potentially be cancelled if patient arrived with significant hyperglycemia on the day of surgery.  I routed A1C results to Dr. Carlis Abbott as well.    George Hugh [facility] Short Stay Center/Anesthesiology Phone [phone] 02/01/2015 9:41 AM

## 2015-02-01 LAB — HEMOGLOBIN A1C
Hgb A1c MFr Bld: 10 % — ABNORMAL HIGH (ref 4.8–5.6)
Mean Plasma Glucose: 240 mg/dL

## 2015-02-03 ENCOUNTER — Other Ambulatory Visit: Payer: Self-pay

## 2015-02-05 MED ORDER — CEFAZOLIN SODIUM-DEXTROSE 2-3 GM-% IV SOLR
2.0000 g | INTRAVENOUS | Status: AC
  Administered 2015-02-06: 2 g via INTRAVENOUS

## 2015-02-05 NOTE — H&P (Signed)
[name] is an 81 y.o. female.   Chief Complaint: right leg pain HPI: patient who in the past had surgery at l34, l5-s1 who came to my office with relatives complaining of lumbar pain with radiation to the right foot no better with conservative treatment. Mri showed a large hnp at right l45 space  Past Medical History  Diagnosis Date  . Hypertension   . Hyperlipemia   . Arthritis   . Cancer     skin  . Staph infection     1970's  . Essential and other specified forms of tremor 05/03/2013  . Cholelithiasis   . Chronic insomnia   . Chronic low back pain   . Obesity   . Peripheral edema   . Wrist fracture     left October 2014  . Hx of acute renal failure 02/2014    admitted to [facility]  . DDD (degenerative disc disease), lumbar   . Hx: UTI (urinary tract infection) 02/2014  . Diabetes mellitus     Iddm x 12 years  . Diabetic peripheral neuropathy   . Pneumonia     as a child  . GERD (gastroesophageal reflux disease)   . Umbilical hernia   . Anemia     as a teenager  . Complication of anesthesia   . PONV (postoperative nausea and vomiting)     pt states Zofran not that helpful in the past, Phenergan works best for her    Past Surgical History  Procedure Laterality Date  . Abdominal hysterectomy    . Hernia repair    . Total knee arthroplasty Right   . Cataract extraction Bilateral     w/ lens implants  . Oophorectomy    . Neck surgery    . Cardiac catheterization  2000    Dr Einar Gip  . Back surgery  2005  . Eye surgery    . Colonoscopy  >10 years  . Lumbar laminectomy/decompression microdiscectomy Left 03/16/2014    Procedure: LUMBAR LAMINECTOMY/DECOMPRESSION MICRODISCECTOMY 1 LEVEL  lumbar level three/four;  Surgeon: [name], MD;  Location: [facility];  Service: Neurosurgery;  Laterality: Left;    Family History  Problem Relation Age of Onset  . Cancer Brother     prostate  . Heart disease Brother   . Alzheimer's disease Brother   . Stroke Mother    . Hypertension Mother   . COPD Brother   . Cancer Brother     Prostate cancer  . Hypertension Sister     Congestive heart failure  . Heart attack Son    Social History:  reports that she has never smoked. She has never used smokeless tobacco. She reports that she does not drink alcohol or use illicit drugs.  Allergies:  Allergies  Allergen Reactions  . Codeine Nausea And Vomiting  . Dilaudid [Hydromorphone Hcl] Nausea And Vomiting and Other (See Comments)    Severe sweating  . Ciprofloxacin Other (See Comments)    Acute kidney failure  . Mysoline [Primidone] Nausea Only  . Oxycodone Nausea And Vomiting    No prescriptions prior to admission    No results found for this or any previous visit (from the past 48 hour(s)). No results found.  Review of Systems  Constitutional: Negative.   Eyes: Negative.   Respiratory: Negative.   Cardiovascular: Negative.   Gastrointestinal: Negative.   Musculoskeletal: Positive for back pain.  Skin: Negative.   Neurological: Positive for sensory change and focal weakness.  Endo/Heme/Allergies: Negative.  Psychiatric/Behavioral: Negative.     There were no vitals taken for this visit. Physical Exam hent, nl. Neck, nl. Cv, nl. Lungs, clear. Abdomen, soft. Extremities scar in knee. Neuro weakness of DF of right foot. Femoral stretch positive. MRI examination of the internal auditory canals positive for a right l4-5 hnp  Assessment/Plan Patient agrees with right l45 discectomy. She is aware of risks and benefits  Amerie Beaumont M 02/05/2015, 2:14 PM

## 2015-02-06 ENCOUNTER — Ambulatory Visit (HOSPITAL_COMMUNITY): Payer: Medicare Other

## 2015-02-06 ENCOUNTER — Ambulatory Visit (HOSPITAL_COMMUNITY): Payer: Medicare Other | Admitting: Certified Registered Nurse Anesthetist

## 2015-02-06 ENCOUNTER — Ambulatory Visit (HOSPITAL_COMMUNITY): Payer: Medicare Other | Admitting: Vascular Surgery

## 2015-02-06 ENCOUNTER — Encounter (HOSPITAL_COMMUNITY)
Admission: RE | Disposition: A | Discharge status: Home / Self Care | Payer: Self-pay | Source: Ambulatory Visit | Attending: Neurosurgery

## 2015-02-06 ENCOUNTER — Inpatient Hospital Stay (HOSPITAL_COMMUNITY)
Admission: RE | Admit: 2015-02-06 | Discharge: 2015-02-08 | DRG: 520 | Disposition: A | Discharge status: Home / Self Care | Payer: Medicare Other | Source: Ambulatory Visit | Attending: Neurosurgery | Admitting: Neurosurgery

## 2015-02-06 DIAGNOSIS — Z881 Allergy status to other antibiotic agents status: Secondary | ICD-10-CM | POA: Diagnosis not present

## 2015-02-06 DIAGNOSIS — G250 Essential tremor: Secondary | ICD-10-CM | POA: Diagnosis present

## 2015-02-06 DIAGNOSIS — M5136 Other intervertebral disc degeneration, lumbar region: Secondary | ICD-10-CM | POA: Diagnosis present

## 2015-02-06 DIAGNOSIS — Z794 Long term (current) use of insulin: Secondary | ICD-10-CM | POA: Diagnosis not present

## 2015-02-06 DIAGNOSIS — Z885 Allergy status to narcotic agent status: Secondary | ICD-10-CM | POA: Diagnosis not present

## 2015-02-06 DIAGNOSIS — Z90721 Acquired absence of ovaries, unilateral: Secondary | ICD-10-CM | POA: Diagnosis present

## 2015-02-06 DIAGNOSIS — M5126 Other intervertebral disc displacement, lumbar region: Principal | ICD-10-CM | POA: Diagnosis present

## 2015-02-06 DIAGNOSIS — E785 Hyperlipidemia, unspecified: Secondary | ICD-10-CM | POA: Diagnosis present

## 2015-02-06 DIAGNOSIS — G8929 Other chronic pain: Secondary | ICD-10-CM | POA: Diagnosis present

## 2015-02-06 DIAGNOSIS — Z96651 Presence of right artificial knee joint: Secondary | ICD-10-CM | POA: Diagnosis present

## 2015-02-06 DIAGNOSIS — Z85828 Personal history of other malignant neoplasm of skin: Secondary | ICD-10-CM | POA: Diagnosis not present

## 2015-02-06 DIAGNOSIS — M1990 Unspecified osteoarthritis, unspecified site: Secondary | ICD-10-CM | POA: Diagnosis present

## 2015-02-06 DIAGNOSIS — Z8249 Family history of ischemic heart disease and other diseases of the circulatory system: Secondary | ICD-10-CM

## 2015-02-06 DIAGNOSIS — Z90710 Acquired absence of both cervix and uterus: Secondary | ICD-10-CM

## 2015-02-06 DIAGNOSIS — Z888 Allergy status to other drugs, medicaments and biological substances status: Secondary | ICD-10-CM | POA: Diagnosis not present

## 2015-02-06 DIAGNOSIS — E119 Type 2 diabetes mellitus without complications: Secondary | ICD-10-CM | POA: Diagnosis present

## 2015-02-06 DIAGNOSIS — IMO0002 Reserved for concepts with insufficient information to code with codable children: Secondary | ICD-10-CM

## 2015-02-06 DIAGNOSIS — I10 Essential (primary) hypertension: Secondary | ICD-10-CM | POA: Diagnosis present

## 2015-02-06 DIAGNOSIS — Z6836 Body mass index (BMI) 36.0-36.9, adult: Secondary | ICD-10-CM

## 2015-02-06 DIAGNOSIS — E669 Obesity, unspecified: Secondary | ICD-10-CM | POA: Diagnosis present

## 2015-02-06 DIAGNOSIS — K219 Gastro-esophageal reflux disease without esophagitis: Secondary | ICD-10-CM | POA: Diagnosis present

## 2015-02-06 DIAGNOSIS — F5104 Psychophysiologic insomnia: Secondary | ICD-10-CM | POA: Diagnosis present

## 2015-02-06 HISTORY — PX: LUMBAR LAMINECTOMY/DECOMPRESSION MICRODISCECTOMY: SHX5026

## 2015-02-06 LAB — GLUCOSE, CAPILLARY
Glucose-Capillary: 151 mg/dL — ABNORMAL HIGH (ref 65–99)
Glucose-Capillary: 172 mg/dL — ABNORMAL HIGH (ref 65–99)
Glucose-Capillary: 173 mg/dL — ABNORMAL HIGH (ref 65–99)
Glucose-Capillary: 258 mg/dL — ABNORMAL HIGH (ref 65–99)
Glucose-Capillary: 308 mg/dL — ABNORMAL HIGH (ref 65–99)

## 2015-02-06 SURGERY — LUMBAR LAMINECTOMY/DECOMPRESSION MICRODISCECTOMY 1 LEVEL
Anesthesia: General | Site: Back | Laterality: Right

## 2015-02-06 MED ORDER — VANCOMYCIN HCL 1000 MG IV SOLR
INTRAVENOUS | Status: DC | PRN
  Administered 2015-02-06: 1000 mg

## 2015-02-06 MED ORDER — KETOROLAC TROMETHAMINE 30 MG/ML IJ SOLN
INTRAMUSCULAR | Status: AC
  Filled 2015-02-06: qty 1

## 2015-02-06 MED ORDER — CEFAZOLIN SODIUM 1-5 GM-% IV SOLN
1.0000 g | Freq: Three times a day (TID) | INTRAVENOUS | Status: AC
Start: 2015-02-06 — End: 2015-02-07
  Administered 2015-02-06 – 2015-02-07 (×2): 1 g via INTRAVENOUS
  Filled 2015-02-06 (×2): qty 50

## 2015-02-06 MED ORDER — SODIUM CHLORIDE 0.9 % IV SOLN
INTRAVENOUS | Status: DC
  Administered 2015-02-06: 17:00:00 via INTRAVENOUS

## 2015-02-06 MED ORDER — BUPIVACAINE LIPOSOME 1.3 % IJ SUSP
INTRAMUSCULAR | Status: DC | PRN
  Administered 2015-02-06: 20 mL

## 2015-02-06 MED ORDER — INSULIN ASPART 100 UNIT/ML ~~LOC~~ SOLN
0.0000 [IU] | Freq: Three times a day (TID) | SUBCUTANEOUS | Status: DC
  Administered 2015-02-06: 4 [IU] via SUBCUTANEOUS
  Administered 2015-02-06 – 2015-02-07 (×3): 11 [IU] via SUBCUTANEOUS
  Administered 2015-02-07 – 2015-02-08 (×3): 7 [IU] via SUBCUTANEOUS

## 2015-02-06 MED ORDER — MEPERIDINE HCL 25 MG/ML IJ SOLN: 6.2500 mg | INTRAMUSCULAR | Status: DC | PRN

## 2015-02-06 MED ORDER — PHENOL 1.4 % MT LIQD
1.0000 | OROMUCOSAL | Status: DC | PRN
Start: 2015-02-06 — End: 2015-02-08

## 2015-02-06 MED ORDER — PHENYLEPHRINE 40 MCG/ML (10ML) SYRINGE FOR IV PUSH (FOR BLOOD PRESSURE SUPPORT)
PREFILLED_SYRINGE | INTRAVENOUS | Status: AC
  Filled 2015-02-06: qty 10

## 2015-02-06 MED ORDER — BUPIVACAINE LIPOSOME 1.3 % IJ SUSP
20.0000 mL | Freq: Once | INTRAMUSCULAR | Status: DC
Start: 2015-02-06 — End: 2015-02-08
  Filled 2015-02-06: qty 20

## 2015-02-06 MED ORDER — ONDANSETRON HCL 4 MG/2ML IJ SOLN: 4.0000 mg | INTRAMUSCULAR | Status: DC | PRN

## 2015-02-06 MED ORDER — SODIUM CHLORIDE 0.9 % IJ SOLN
3.0000 mL | Freq: Two times a day (BID) | INTRAMUSCULAR | Status: DC
  Administered 2015-02-06 (×2): 3 mL via INTRAVENOUS
  Administered 2015-02-07: 21:00:00 via INTRAVENOUS
  Administered 2015-02-07 – 2015-02-08 (×2): 3 mL via INTRAVENOUS

## 2015-02-06 MED ORDER — PHENYLEPHRINE HCL 10 MG/ML IJ SOLN
INTRAMUSCULAR | Status: DC | PRN
  Administered 2015-02-06: 40 ug via INTRAVENOUS
  Administered 2015-02-06 (×3): 80 ug via INTRAVENOUS

## 2015-02-06 MED ORDER — THROMBIN 5000 UNITS EX SOLR
CUTANEOUS | Status: DC | PRN
  Administered 2015-02-06 (×2): 5000 [IU] via TOPICAL

## 2015-02-06 MED ORDER — SODIUM CHLORIDE 0.9 % IJ SOLN
INTRAMUSCULAR | Status: AC
  Filled 2015-02-06: qty 10

## 2015-02-06 MED ORDER — SCOPOLAMINE 1 MG/3DAYS TD PT72
MEDICATED_PATCH | TRANSDERMAL | Status: AC
  Administered 2015-02-06: 1 via TRANSDERMAL
  Filled 2015-02-06: qty 1

## 2015-02-06 MED ORDER — ONDANSETRON HCL 4 MG/2ML IJ SOLN
INTRAMUSCULAR | Status: AC
  Filled 2015-02-06: qty 2

## 2015-02-06 MED ORDER — VANCOMYCIN HCL 1000 MG IV SOLR
INTRAVENOUS | Status: AC
  Filled 2015-02-06: qty 1000

## 2015-02-06 MED ORDER — 0.9 % SODIUM CHLORIDE (POUR BTL) OPTIME
TOPICAL | Status: DC | PRN
  Administered 2015-02-06: 1000 mL

## 2015-02-06 MED ORDER — DEXAMETHASONE SODIUM PHOSPHATE 4 MG/ML IJ SOLN
INTRAMUSCULAR | Status: DC | PRN
  Administered 2015-02-06: 4 mg via INTRAVENOUS

## 2015-02-06 MED ORDER — DEXAMETHASONE SODIUM PHOSPHATE 4 MG/ML IJ SOLN
INTRAMUSCULAR | Status: AC
  Filled 2015-02-06: qty 1

## 2015-02-06 MED ORDER — ACETAMINOPHEN 325 MG PO TABS
650.0000 mg | ORAL_TABLET | ORAL | Status: DC | PRN
  Administered 2015-02-07: 650 mg via ORAL
  Filled 2015-02-06: qty 2

## 2015-02-06 MED ORDER — METFORMIN HCL 500 MG PO TABS
1000.0000 mg | ORAL_TABLET | Freq: Two times a day (BID) | ORAL | Status: DC
  Administered 2015-02-06 – 2015-02-08 (×4): 1000 mg via ORAL
  Filled 2015-02-06 (×5): qty 2

## 2015-02-06 MED ORDER — ACETAMINOPHEN 650 MG RE SUPP: 650.0000 mg | RECTAL | Status: DC | PRN

## 2015-02-06 MED ORDER — METHYLPREDNISOLONE ACETATE 80 MG/ML IJ SUSP
INTRAMUSCULAR | Status: DC | PRN
  Administered 2015-02-06: 80 mg

## 2015-02-06 MED ORDER — LINAGLIPTIN 5 MG PO TABS
5.0000 mg | ORAL_TABLET | Freq: Every day | ORAL | Status: DC
Start: 2015-02-06 — End: 2015-02-08
  Administered 2015-02-06 – 2015-02-08 (×3): 5 mg via ORAL
  Filled 2015-02-06 (×3): qty 1

## 2015-02-06 MED ORDER — FENTANYL CITRATE (PF) 250 MCG/5ML IJ SOLN
INTRAMUSCULAR | Status: AC
  Filled 2015-02-06: qty 5

## 2015-02-06 MED ORDER — LOSARTAN POTASSIUM 50 MG PO TABS
100.0000 mg | ORAL_TABLET | Freq: Every day | ORAL | Status: DC
  Administered 2015-02-06 – 2015-02-08 (×3): 100 mg via ORAL
  Filled 2015-02-06 (×3): qty 2

## 2015-02-06 MED ORDER — SENNOSIDES-DOCUSATE SODIUM 8.6-50 MG PO TABS: 1.0000 | ORAL_TABLET | Freq: Every evening | ORAL | Status: DC | PRN

## 2015-02-06 MED ORDER — INSULIN ASPART 100 UNIT/ML ~~LOC~~ SOLN
0.0000 [IU] | Freq: Every day | SUBCUTANEOUS | Status: DC
  Administered 2015-02-06: 4 [IU] via SUBCUTANEOUS
  Administered 2015-02-07: 2 [IU] via SUBCUTANEOUS

## 2015-02-06 MED ORDER — GABAPENTIN 400 MG PO CAPS
400.0000 mg | ORAL_CAPSULE | Freq: Two times a day (BID) | ORAL | Status: DC
  Administered 2015-02-06 – 2015-02-08 (×4): 400 mg via ORAL
  Filled 2015-02-06 (×4): qty 1

## 2015-02-06 MED ORDER — CHLORTHALIDONE 25 MG PO TABS
25.0000 mg | ORAL_TABLET | Freq: Every day | ORAL | Status: DC
  Administered 2015-02-06 – 2015-02-08 (×3): 25 mg via ORAL
  Filled 2015-02-06 (×3): qty 1

## 2015-02-06 MED ORDER — PHENYLEPHRINE HCL 10 MG/ML IJ SOLN
10.0000 mg | INTRAVENOUS | Status: DC | PRN
  Administered 2015-02-06: 20 ug/min via INTRAVENOUS

## 2015-02-06 MED ORDER — PROPOFOL 10 MG/ML IV BOLUS
INTRAVENOUS | Status: DC | PRN
  Administered 2015-02-06: 130 mg via INTRAVENOUS
  Administered 2015-02-06: 20 mg via INTRAVENOUS

## 2015-02-06 MED ORDER — NEOSTIGMINE METHYLSULFATE 10 MG/10ML IV SOLN
INTRAVENOUS | Status: DC | PRN
  Administered 2015-02-06: 2 mg via INTRAVENOUS
  Administered 2015-02-06: 1 mg via INTRAVENOUS

## 2015-02-06 MED ORDER — FENTANYL CITRATE (PF) 100 MCG/2ML IJ SOLN
INTRAMUSCULAR | Status: DC | PRN
  Administered 2015-02-06 (×3): 50 ug via INTRAVENOUS

## 2015-02-06 MED ORDER — PROPOFOL 10 MG/ML IV BOLUS
INTRAVENOUS | Status: AC
  Filled 2015-02-06: qty 20

## 2015-02-06 MED ORDER — ONDANSETRON HCL 4 MG/2ML IJ SOLN
INTRAMUSCULAR | Status: DC | PRN
  Administered 2015-02-06 (×2): 4 mg via INTRAVENOUS

## 2015-02-06 MED ORDER — FENTANYL CITRATE (PF) 100 MCG/2ML IJ SOLN
25.0000 ug | INTRAMUSCULAR | Status: DC | PRN
  Administered 2015-02-06 (×2): 50 ug via INTRAVENOUS

## 2015-02-06 MED ORDER — CEFAZOLIN SODIUM-DEXTROSE 2-3 GM-% IV SOLR
INTRAVENOUS | Status: AC
  Filled 2015-02-06: qty 50

## 2015-02-06 MED ORDER — ONDANSETRON HCL 4 MG/2ML IJ SOLN
INTRAMUSCULAR | Status: AC
  Filled 2015-02-06: qty 4

## 2015-02-06 MED ORDER — CLONIDINE HCL 0.1 MG PO TABS
0.1000 mg | ORAL_TABLET | Freq: Two times a day (BID) | ORAL | Status: DC
Start: 2015-02-06 — End: 2015-02-08
  Administered 2015-02-06 – 2015-02-08 (×4): 0.1 mg via ORAL
  Filled 2015-02-06 (×4): qty 1

## 2015-02-06 MED ORDER — DIAZEPAM 5 MG PO TABS
5.0000 mg | ORAL_TABLET | Freq: Four times a day (QID) | ORAL | Status: DC | PRN
  Administered 2015-02-06: 5 mg via ORAL
  Filled 2015-02-06: qty 1

## 2015-02-06 MED ORDER — MENTHOL 3 MG MT LOZG: 1.0000 | LOZENGE | OROMUCOSAL | Status: DC | PRN

## 2015-02-06 MED ORDER — LIDOCAINE HCL (CARDIAC) 20 MG/ML IV SOLN
INTRAVENOUS | Status: AC
  Filled 2015-02-06: qty 5

## 2015-02-06 MED ORDER — PRAVASTATIN SODIUM 40 MG PO TABS
40.0000 mg | ORAL_TABLET | Freq: Every day | ORAL | Status: DC
Start: 2015-02-06 — End: 2015-02-08
  Administered 2015-02-06 – 2015-02-07 (×2): 40 mg via ORAL
  Filled 2015-02-06 (×2): qty 1

## 2015-02-06 MED ORDER — GLYCOPYRROLATE 0.2 MG/ML IJ SOLN
INTRAMUSCULAR | Status: DC | PRN
  Administered 2015-02-06 (×2): .2 mg via INTRAVENOUS

## 2015-02-06 MED ORDER — LACTATED RINGERS IV SOLN
INTRAVENOUS | Status: DC | PRN
  Administered 2015-02-06 (×2): via INTRAVENOUS

## 2015-02-06 MED ORDER — ROCURONIUM BROMIDE 50 MG/5ML IV SOLN
INTRAVENOUS | Status: AC
  Filled 2015-02-06: qty 1

## 2015-02-06 MED ORDER — SODIUM CHLORIDE 0.9 % IV SOLN: 250.0000 mL | INTRAVENOUS | Status: DC

## 2015-02-06 MED ORDER — EPHEDRINE SULFATE 50 MG/ML IJ SOLN
INTRAMUSCULAR | Status: AC
  Filled 2015-02-06: qty 1

## 2015-02-06 MED ORDER — HEMOSTATIC AGENTS (NO CHARGE) OPTIME
TOPICAL | Status: DC | PRN
  Administered 2015-02-06: 1 via TOPICAL

## 2015-02-06 MED ORDER — MORPHINE SULFATE 2 MG/ML IJ SOLN: 1.0000 mg | INTRAMUSCULAR | Status: DC | PRN

## 2015-02-06 MED ORDER — PROPRANOLOL HCL 20 MG PO TABS
20.0000 mg | ORAL_TABLET | Freq: Two times a day (BID) | ORAL | Status: DC
  Administered 2015-02-06 – 2015-02-08 (×4): 20 mg via ORAL
  Filled 2015-02-06 (×5): qty 1

## 2015-02-06 MED ORDER — FENTANYL CITRATE (PF) 100 MCG/2ML IJ SOLN
INTRAMUSCULAR | Status: AC
  Administered 2015-02-06: 50 ug via INTRAVENOUS
  Filled 2015-02-06: qty 2

## 2015-02-06 MED ORDER — PROMETHAZINE HCL 25 MG/ML IJ SOLN: 6.2500 mg | INTRAMUSCULAR | Status: DC | PRN

## 2015-02-06 MED ORDER — GLYCOPYRROLATE 0.2 MG/ML IJ SOLN
INTRAMUSCULAR | Status: AC
  Filled 2015-02-06: qty 2

## 2015-02-06 MED ORDER — KETOROLAC TROMETHAMINE 30 MG/ML IJ SOLN
15.0000 mg | Freq: Once | INTRAMUSCULAR | Status: AC | PRN
  Administered 2015-02-06: 15 mg via INTRAVENOUS

## 2015-02-06 MED ORDER — ROCURONIUM BROMIDE 100 MG/10ML IV SOLN
INTRAVENOUS | Status: DC | PRN
  Administered 2015-02-06: 10 mg via INTRAVENOUS
  Administered 2015-02-06: 40 mg via INTRAVENOUS

## 2015-02-06 MED ORDER — NEOSTIGMINE METHYLSULFATE 10 MG/10ML IV SOLN
INTRAVENOUS | Status: AC
  Filled 2015-02-06: qty 1

## 2015-02-06 MED ORDER — FENTANYL CITRATE (PF) 100 MCG/2ML IJ SOLN
INTRAMUSCULAR | Status: DC | PRN
  Administered 2015-02-06: 100 ug via INTRAVENOUS

## 2015-02-06 MED ORDER — SODIUM CHLORIDE 0.9 % IJ SOLN: 3.0000 mL | INTRAMUSCULAR | Status: DC | PRN

## 2015-02-06 SURGICAL SUPPLY — 52 items
BENZOIN TINCTURE PRP APPL 2/3 (GAUZE/BANDAGES/DRESSINGS) ×2 IMPLANT
BLADE CLIPPER SURG (BLADE) IMPLANT
BUR ACORN 6.0 (BURR) ×2 IMPLANT
BUR MATCHSTICK NEURO 3.0 LAGG (BURR) IMPLANT
CANISTER SUCT 3000ML PPV (MISCELLANEOUS) ×2 IMPLANT
CONT SPEC 4OZ CLIKSEAL STRL BL (MISCELLANEOUS) ×2 IMPLANT
DRAPE LAPAROTOMY 100X72X124 (DRAPES) ×2 IMPLANT
DRAPE MICROSCOPE LEICA (MISCELLANEOUS) ×2 IMPLANT
DRAPE POUCH INSTRU U-SHP 10X18 (DRAPES) ×2 IMPLANT
DRSG OPSITE POSTOP 4X6 (GAUZE/BANDAGES/DRESSINGS) ×2 IMPLANT
DRSG PAD ABDOMINAL 8X10 ST (GAUZE/BANDAGES/DRESSINGS) IMPLANT
DURAPREP 26ML APPLICATOR (WOUND CARE) ×2 IMPLANT
ELECT BLADE 4.0 EZ CLEAN MEGAD (MISCELLANEOUS) ×2
ELECT REM PT RETURN 9FT ADLT (ELECTROSURGICAL) ×2
ELECTRODE BLDE 4.0 EZ CLN MEGD (MISCELLANEOUS) ×1 IMPLANT
ELECTRODE REM PT RTRN 9FT ADLT (ELECTROSURGICAL) ×1 IMPLANT
GAUZE SPONGE 4X4 12PLY STRL (GAUZE/BANDAGES/DRESSINGS) ×2 IMPLANT
GAUZE SPONGE 4X4 16PLY XRAY LF (GAUZE/BANDAGES/DRESSINGS) IMPLANT
GLOVE BIOGEL M 8.0 STRL (GLOVE) ×2 IMPLANT
GLOVE EXAM NITRILE LRG STRL (GLOVE) IMPLANT
GLOVE EXAM NITRILE MD LF STRL (GLOVE) IMPLANT
GLOVE EXAM NITRILE XL STR (GLOVE) IMPLANT
GLOVE EXAM NITRILE XS STR PU (GLOVE) IMPLANT
GOWN STRL REUS W/ TWL LRG LVL3 (GOWN DISPOSABLE) ×1 IMPLANT
GOWN STRL REUS W/ TWL XL LVL3 (GOWN DISPOSABLE) IMPLANT
GOWN STRL REUS W/TWL 2XL LVL3 (GOWN DISPOSABLE) IMPLANT
GOWN STRL REUS W/TWL LRG LVL3 (GOWN DISPOSABLE) ×1
GOWN STRL REUS W/TWL XL LVL3 (GOWN DISPOSABLE)
KIT BASIN OR (CUSTOM PROCEDURE TRAY) ×2 IMPLANT
KIT ROOM TURNOVER OR (KITS) ×2 IMPLANT
NEEDLE HYPO 18GX1.5 BLUNT FILL (NEEDLE) IMPLANT
NEEDLE HYPO 21X1.5 SAFETY (NEEDLE) IMPLANT
NEEDLE HYPO 25X1 1.5 SAFETY (NEEDLE) IMPLANT
NEEDLE SPNL 20GX3.5 QUINCKE YW (NEEDLE) IMPLANT
NS IRRIG 1000ML POUR BTL (IV SOLUTION) ×2 IMPLANT
PACK LAMINECTOMY NEURO (CUSTOM PROCEDURE TRAY) ×2 IMPLANT
PAD ARMBOARD 7.5X6 YLW CONV (MISCELLANEOUS) ×6 IMPLANT
PATTIES SURGICAL .5 X1 (DISPOSABLE) ×2 IMPLANT
RUBBERBAND STERILE (MISCELLANEOUS) ×4 IMPLANT
SPONGE LAP 4X18 X RAY DECT (DISPOSABLE) IMPLANT
SPONGE SURGIFOAM ABS GEL SZ50 (HEMOSTASIS) ×2 IMPLANT
STRIP CLOSURE SKIN 1/2X4 (GAUZE/BANDAGES/DRESSINGS) ×2 IMPLANT
SUT VIC AB 0 CT1 18XCR BRD8 (SUTURE) ×1 IMPLANT
SUT VIC AB 0 CT1 8-18 (SUTURE) ×1
SUT VIC AB 2-0 CP2 18 (SUTURE) ×2 IMPLANT
SUT VIC AB 3-0 SH 8-18 (SUTURE) ×2 IMPLANT
SYR 20CC LL (SYRINGE) IMPLANT
SYR 20ML ECCENTRIC (SYRINGE) ×2 IMPLANT
SYR 5ML LL (SYRINGE) IMPLANT
TOWEL OR 17X24 6PK STRL BLUE (TOWEL DISPOSABLE) ×2 IMPLANT
TOWEL OR 17X26 10 PK STRL BLUE (TOWEL DISPOSABLE) ×2 IMPLANT
WATER STERILE IRR 1000ML POUR (IV SOLUTION) ×2 IMPLANT

## 2015-02-06 NOTE — Anesthesia Preprocedure Evaluation (Addendum)
Anesthesia Evaluation  Patient identified by MRN, date of birth, ID band Patient awake    Reviewed: Allergy & Precautions, H&P , NPO status , Patient's Chart, lab work & pertinent test results, reviewed documented beta blocker date and time   History of Anesthesia Complications (+) PONV and history of anesthetic complications  Airway Mallampati: II       Dental   Pulmonary pneumonia -, resolved,  breath sounds clear to auscultation        Cardiovascular hypertension, Pt. on medications and Pt. on home beta blockers Rhythm:Regular Rate:Normal     Neuro/Psych negative neurological ROS  negative psych ROS   GI/Hepatic Neg liver ROS, GERD-  ,  Endo/Other  diabetes, Type 2, Insulin Dependent  Renal/GU Renal disease     Musculoskeletal  (+) Arthritis -,   Abdominal (+) + obese,   Peds  Hematology  (+) anemia ,   Anesthesia Other Findings   Reproductive/Obstetrics                            Anesthesia Physical  Anesthesia Plan  ASA: III  Anesthesia Plan: General   Post-op Pain Management:    Induction: Intravenous  Airway Management Planned: Oral ETT  Additional Equipment: None  Intra-op Plan:   Post-operative Plan: Extubation in OR  Informed Consent: I have reviewed the patients History and Physical, chart, labs and discussed the procedure including the risks, benefits and alternatives for the proposed anesthesia with the patient or authorized representative who has indicated his/her understanding and acceptance.   Dental advisory given  Plan Discussed with: CRNA  Anesthesia Plan Comments:        Anesthesia Quick Evaluation

## 2015-02-06 NOTE — Anesthesia Postprocedure Evaluation (Signed)
Anesthesia Post Note  Patient: [name]  Procedure(s) Performed: Procedure(s) (LRB): Right L4-5 Microdiskectomy (Right)  Anesthesia type: General  Patient location: PACU  Post pain: Pain level controlled  Post assessment: Post-op Vital signs reviewed  Last Vitals: BP 131/80 mmHg  Pulse 74  Temp(Src) 36.4 C (Oral)  Resp 20  Ht 5\' 9"  (1.753 m)  Wt 247 lb (112.038 kg)  BMI 36.46 kg/m2  SpO2 96%  Post vital signs: Reviewed  Level of consciousness: sedated  Complications: No apparent anesthesia complications

## 2015-02-06 NOTE — Progress Notes (Signed)
Pt has consistently refused pain medication although offered numerous times.  She consistently rates her pain a 5/10.  She has been up to the bathroom and walked in the hall with a walker.  Will continue to monitor.

## 2015-02-06 NOTE — Progress Notes (Signed)
Pt admitted to 4N01; Alert and oriented.  Honeycomb dressing has small stained area that is marked. She rates her pain a 5/10 and was medicated in PACU. Pt denies numbness and tingling, or nausea.  She has been oriented to room and understands to use call bell to get out of bed and understands bed alarm.  Pt has glasses on.  Will continue to monitor and assess.

## 2015-02-06 NOTE — Transfer of Care (Signed)
Immediate Anesthesia Transfer of Care Note  Patient: [name]  Procedure(s) Performed: Procedure(s) with comments: Right L4-5 Microdiskectomy (Right) - Right L4-5 Microdiskectomy  Patient Location: PACU  Anesthesia Type:General  Level of Consciousness: awake, alert  and oriented  Airway & Oxygen Therapy: Patient Spontanous Breathing and Patient connected to nasal cannula oxygen  Post-op Assessment: Report given to RN and Post -op Vital signs reviewed and stable  Post vital signs: Reviewed and stable  Last Vitals:  Filed Vitals:   02/06/15 0716  BP: 128/58  Pulse: 74  Temp: 36.7 C  Resp: 20    Complications: No apparent anesthesia complications

## 2015-02-06 NOTE — Anesthesia Procedure Notes (Signed)
Procedure Name: Intubation Date/Time: 02/06/2015 9:14 AM Performed by: Merdis Delay Pre-anesthesia Checklist: Patient identified, Timeout performed, Emergency Drugs available, Suction available and Patient being monitored Patient Re-evaluated:Patient Re-evaluated prior to inductionOxygen Delivery Method: Circle system utilized Preoxygenation: Pre-oxygenation with 100% oxygen Intubation Type: IV induction Ventilation: Mask ventilation without difficulty Laryngoscope Size: Mac and 3 Grade View: Grade II Tube type: Oral Tube size: 7.5 mm Number of attempts: 1 Airway Equipment and Method: Stylet Placement Confirmation: ETT inserted through vocal cords under direct vision,  breath sounds checked- equal and bilateral,  positive ETCO2 and CO2 detector Secured at: 22 cm Tube secured with: Tape Dental Injury: Teeth and Oropharynx as per pre-operative assessment

## 2015-02-07 ENCOUNTER — Encounter (HOSPITAL_COMMUNITY): Payer: Self-pay | Admitting: Neurosurgery

## 2015-02-07 ENCOUNTER — Other Ambulatory Visit: Payer: Medicare Other

## 2015-02-07 LAB — GLUCOSE, CAPILLARY
Glucose-Capillary: 222 mg/dL — ABNORMAL HIGH (ref 65–99)
Glucose-Capillary: 259 mg/dL — ABNORMAL HIGH (ref 65–99)
Glucose-Capillary: 259 mg/dL — ABNORMAL HIGH (ref 65–99)
Glucose-Capillary: 247 mg/dL — ABNORMAL HIGH (ref 65–99)

## 2015-02-07 NOTE — Progress Notes (Signed)
Patient ID: [name], female   DOB: 01-Oct-1932, 81 y.o.   MRN: [id number] Doing well, no leg pain. Seen by pt . Lives alone. Dc in am

## 2015-02-07 NOTE — Evaluation (Signed)
Physical Therapy Evaluation Patient Details Name: [name] MRN: [id number] DOB: 05-27-1933 Today's Date: 02/07/2015   History of Present Illness  81 y.o female, s/p L4-L5 diskectomy, PMH: L3-4 laminectomy, R total knee arthroplasty, arthritis  Clinical Impression  Pt very motivated for mobility and doing quite well.  Pt has all needed DME for D/C, but would benefit from HHPT for home safety.  Will continue to follow while on acute.      Follow Up Recommendations Home health PT;Supervision - Intermittent    Equipment Recommendations  None recommended by PT    Recommendations for Other Services       Precautions / Restrictions Precautions Precautions: Back Precaution Booklet Issued: Yes (comment) Precaution Comments: Handout reviewed with Pt Restrictions Weight Bearing Restrictions: No      Mobility  Bed Mobility               General bed mobility comments: in chair upon arrival  Transfers Overall transfer level: Needs assistance Equipment used: Rolling walker (2 wheeled) Transfers: Sit to/from Stand Sit to Stand: Supervision         General transfer comment: pt demonstrates safe technique.    Ambulation/Gait Ambulation/Gait assistance: Supervision Ambulation Distance (Feet): 300 Feet Assistive device: Rolling walker (2 wheeled) Gait Pattern/deviations: Step-through pattern;Decreased stride length     General Gait Details: pt moves slowly, but demonstrates good use of RW.    Stairs            Wheelchair Mobility    Modified Rankin (Stroke Patients Only)       Balance Overall balance assessment: Needs assistance Sitting-balance support: No upper extremity supported;Feet supported Sitting balance-Leahy Scale: Good     Standing balance support: No upper extremity supported;During functional activity Standing balance-Leahy Scale: Fair Standing balance comment: pt needs UE support for dynamic balance.                              Pertinent Vitals/Pain Pain Assessment: 0-10 Pain Score: 4  Pain Location: Back during mobility.   Pain Descriptors / Indicators: Aching Pain Intervention(s): Monitored during session;Repositioned    Home Living Family/patient expects to be discharged to:: Private residence Living Arrangements: Alone Available Help at Discharge: Family;Available PRN/intermittently (daughter ) Type of Home: Apartment Home Access: Stairs to enter   [employer] of Steps: 1 (curb) Home Layout: One level Home Equipment: Grab bars - tub/shower;Tub bench;Walker - 4 wheels;Cane - quad;Walker - 2 wheels;Adaptive equipment Additional Comments: pt lives in independent living apartment     Prior Function Level of Independence: Independent with assistive device(s) (Rollator at home)         Comments: Pt was independent with RW for ambulation. Pt reports not driving, daughter provides transportation     Hand Dominance   Dominant Hand: Right    Extremity/Trunk Assessment   Upper Extremity Assessment: Defer to OT evaluation           Lower Extremity Assessment: Overall WFL for tasks assessed         Communication   Communication: No difficulties  Cognition Arousal/Alertness: Awake/alert Behavior During Therapy: WFL for tasks assessed/performed Overall Cognitive Status: Within Functional Limits for tasks assessed                      General Comments      Exercises        Assessment/Plan    PT Assessment Patient needs continued PT  services  PT Diagnosis Difficulty walking   PT Problem List Decreased activity tolerance;Decreased balance;Decreased mobility;Decreased knowledge of use of DME;Decreased knowledge of precautions;Pain  PT Treatment Interventions DME instruction;Stair training;Gait training;Functional mobility training;Therapeutic activities;Therapeutic exercise;Balance training;Neuromuscular re-education;Patient/family education   PT Goals (Current  goals can be found in the Care Plan section) Acute Rehab PT Goals Patient Stated Goal: to go home PT Goal Formulation: With patient Time For Goal Achievement: 02/14/15 Potential to Achieve Goals: Good    Frequency Min 5X/week   Barriers to discharge        Co-evaluation               End of Session Equipment Utilized During Treatment: Gait belt Activity Tolerance: Patient tolerated treatment well Patient left: in chair;with call bell/phone within reach Nurse Communication: Mobility status         Time: 0900-0919 PT Time Calculation (min) (ACUTE ONLY): 19 min   Charges:   PT Evaluation $Initial PT Evaluation Tier I: 1 Procedure     PT G CodesCatarina Hartshorn, [place] 02/07/2015, 3:47 PM

## 2015-02-07 NOTE — Progress Notes (Signed)
Inpatient Diabetes Program Recommendations  AACE/ADA: New Consensus Statement on Inpatient Glycemic Control (2013)  Target Ranges:  Prepandial:   less than 140 mg/dL      Peak postprandial:   less than 180 mg/dL (1-2 hours)      Critically ill patients:  140 - 180 mg/dL   Results for CORREEN, BUBOLZ (MRN [id number]) as of 02/07/2015 10:02  Ref. Range 02/06/2015 10:54 02/06/2015 13:18 02/06/2015 17:59 02/06/2015 20:54 02/07/2015 06:54  Glucose-Capillary Latest Ref Range: 65-99 mg/dL 172 (H) 173 (H) 258 (H) 308 (H) 222 (H)   Reason for Visit: Diskectomy  Diabetes history: DM 2 Outpatient Diabetes medications: Tradjenta 5 mg Daily, Metformin 1,000 mg BID, Lantus 75 units QHS Current orders for Inpatient glycemic control: Novolog 0-20 units TID, Novolog 0-5 units QHS, Tradjenta 5 mg Daily, Metformin 1,000 mg BID  Inpatient Diabetes Program Recommendations  Insulin - Basal: Patient takes 75 units of basal insulin at home. Glucose level still elevated in the 200's. Please consider starting part of patient's basal insulin Lantus 10 units Q 24hrs (less than 0.1 units/kg).  Thanks,  Tama Headings RN, MSN, [facility] Inpatient Diabetes Coordinator Team Pager [phone]

## 2015-02-07 NOTE — Op Note (Signed)
NAMETEAGEN, BUCIO                ACCOUNT NO.:  [id number]  MEDICAL RECORD NO.:  [id number]  LOCATION:  4N01C                        FACILITY:  [facility]  PHYSICIAN:  Leeroy Cha, M.D.   DATE OF BIRTH:  [birth year]  DATE OF PROCEDURE:  02/06/2015 DATE OF DISCHARGE:                              OPERATIVE REPORT   PREOPERATIVE DIAGNOSIS:  Right L4-L5 herniated disk.  Status post L3-4, 5-1 diskectomy.  POSTOPERATIVE DIAGNOSIS:  Right L4-L5 herniated disk.  Status post L3-4, 5-1 diskectomy.  PROCEDURE:  Right L4-L5 laminotomy, lysis of adhesion, decompression of the L4-5 nerve root, partial facetectomy, diskectomy medially and laterally, decompression of the L4-L5 nerve root, microscope.  SURGEON:  Leeroy Cha, M.D.  ASSISTANT:  Hosie Spangle, M.D.  CLINICAL HISTORY:  The patient had been complaining of pain down to the right leg associated with weakness of the dorsiflexion of the right foot.  Previously, this lady had surgery at the level of 3-4 and 5-1. She did not improve with conservative treatment, so surgery was advised. She and her granddaughter knew the risks and benefits.  DESCRIPTION OF PROCEDURE:  The patient was taken to the OR, and after intubation, she was positioned in a prone manner.  The back was cleaned with DuraPrep.  Secondary to obesity, it was difficult to feel any bony structure.  A midline incision following the previous one was made through the skin and subcutaneous tissue through a thick adipose tissue straight down to the cervical spine with retraction laterally.  First x- ray showed the upper clip was at L3 and the lower at L4.  From then on, with working at 4-5 where she had quite a bit of adhesion, lysis was accomplished and then with the drill, we removed the upper lamina of L5 and the lower of L4.  We went laterally and we had to drill half of the facet to be able to get into the disk space.  The disk was calcified, we entered, and  decompression of the medial and lateral was achieved with plenty of room for the L4 and L5 nerve root.  The area was irrigated. Valsalva maneuver was negative.  Fentanyl and Depo-Medrol as well as vancomycin powder was left in the operative site, and the wound was closed with Vicryl and staples.          ______________________________ Leeroy Cha, M.D.     EB/MEDQ  D:  02/06/2015  T:  02/07/2015  Job:  [id number]

## 2015-02-07 NOTE — Evaluation (Signed)
Occupational Therapy Evaluation Patient Details Name: [name] MRN: [id number] DOB: Mar 02, 1933 Today's Date: 02/07/2015    History of Present Illness 81 y.o female, s/p L4-L5 diskectomy, PMH: L3-4 laminectomy, R total knee arthroplasty, arthritis   Clinical Impression   Patient is s/p L4-5 diskectomy surgery resulting in functional limitations due to the deficits listed below (see OT problem list). Pt sitting in chair upon arrival, reports pain 3/10. Pt reports using AE during ADLs at home due to past back procedure. Education provided on back precautions, Pt able to verbalize precautions from previous back surgery. Pt demonstrated ability to complete toilet transfer using RW with supervision and tub transfer with min A. Pt reports nervousness when exiting tub, stating tub at home is not as deep. Pt reports daughter will be staying with her upon d/c to assist with ADLs.    Follow Up Recommendations  No OT follow up    Equipment Recommendations  None recommended by OT    Recommendations for Other Services       Precautions / Restrictions Precautions Precautions: Back Precaution Booklet Issued: Yes (comment) Precaution Comments: Handout reviewed with Pt Restrictions Weight Bearing Restrictions: No      Mobility Bed Mobility               General bed mobility comments: in chair upon arrival  Transfers Overall transfer level: Needs assistance Equipment used: Rolling walker (2 wheeled) Transfers: Sit to/from Stand Sit to Stand: Supervision         General transfer comment: Pt demonstrated appropriate hand placement for sit<>stand    Balance Overall balance assessment: History of Falls (Pt report previous falls due to loss of balance at home)                                          ADL Overall ADL's : Needs assistance/impaired Eating/Feeding: Independent   Grooming: Wash/dry hands;Supervision/safety;Standing   Upper Body Bathing:  Modified independent;Standing   Lower Body Bathing: Modified independent;Sit to/from stand (pt reports use of AE at home)   Upper Body Dressing : Independent;Sitting   Lower Body Dressing: Modified independent;Sit to/from stand (pt reports use of AE at home)   Toilet Transfer: Min guard;Regular Toilet;RW;Grab bars   Toileting- Water quality scientist and Hygiene: Independent;Sit to/from stand   Tub/ Shower Transfer: Tub transfer;Minimal assistance;Ambulation;Grab bars;Rolling walker Tub/Shower Transfer Details (indicate cue type and reason): Pt nervous upon exiting tub, states tub at home is lower and easier to step out. Education provided on using shower chair at home. Functional mobility during ADLs: Min guard;Rolling walker General ADL Comments: Pt reports using AE at home to complete ADLs     Vision Vision Assessment?: No apparent visual deficits   Perception     Praxis      Pertinent Vitals/Pain Pain Assessment: 0-10 Pain Score: 3  Pain Location: back Pain Descriptors / Indicators: Dull;Aching Pain Intervention(s): Monitored during session;Repositioned     Hand Dominance Right   Extremity/Trunk Assessment Upper Extremity Assessment Upper Extremity Assessment: Overall WFL for tasks assessed   Lower Extremity Assessment Lower Extremity Assessment: Defer to PT evaluation       Communication Communication Communication: No difficulties   Cognition Arousal/Alertness: Awake/alert Behavior During Therapy: WFL for tasks assessed/performed Overall Cognitive Status: Within Functional Limits for tasks assessed  General Comments       Exercises       Shoulder Instructions      Home Living Family/patient expects to be discharged to:: Private residence Living Arrangements: Alone Available Help at Discharge: Family;Available PRN/intermittently (daughter ) Type of Home: Apartment Home Access: Stairs to enter [employer] of  Steps: 1 (curb)   Home Layout: One level     Bathroom Shower/Tub: Tub/shower unit;Curtain Shower/tub characteristics: Architectural technologist: Handicapped height     Home Equipment: Grab bars - tub/shower;Tub bench;Walker - 4 wheels;Cane - quad;Walker - 2 wheels;Adaptive equipment Adaptive Equipment: Reacher;Sock aid Additional Comments: pt lives in independent living apartment       Prior Functioning/Environment Level of Independence: Independent with assistive device(s) (Rollator at home)        Comments: Pt was independent with RW for ambulation. Pt reports not driving, daughter provides transportation    OT Diagnosis: Generalized weakness   OT Problem List: Decreased strength;Impaired balance (sitting and/or standing)   OT Treatment/Interventions:      OT Goals(Current goals can be found in the care plan section) Acute Rehab OT Goals Patient Stated Goal: to go home OT Goal Formulation: With patient Time For Goal Achievement: 02/21/15 Potential to Achieve Goals: Good  OT Frequency:     Barriers to D/C:            Co-evaluation              End of Session Equipment Utilized During Treatment: Gait belt;Rolling walker Nurse Communication: Mobility status;Precautions  Activity Tolerance: Patient tolerated treatment well;No increased pain Patient left: in chair;with call bell/phone within reach;with nursing/sitter in room   Time: 0715-0752 OT Time Calculation (min): 37 min Charges:  OT General Charges $OT Visit: 1 Procedure OT Evaluation $Initial OT Evaluation Tier I: 1 Procedure OT Treatments $Self Care/Home Management : 8-22 mins G-Codes:    Forest Gleason 02/07/2015, 8:40 AM

## 2015-02-08 LAB — GLUCOSE, CAPILLARY
Glucose-Capillary: 245 mg/dL — ABNORMAL HIGH (ref 65–99)
Glucose-Capillary: 212 mg/dL — ABNORMAL HIGH (ref 65–99)

## 2015-02-08 NOTE — Discharge Summary (Signed)
Physician Discharge Summary  Patient ID: [name] MRN: [id number] DOB/AGE: 12-Feb-1933 82 y.o.  Admit date: 02/06/2015 Discharge date: 02/08/2015  Admission Diagnoses:right l45 HNP Discharge Diagnoses:  Active Problems:   Lumbar herniated disc   Discharged Condition: NO PAIN  Hospital Course: SURGERY  Consults: NONE  Significant Diagnostic Studies: MYELOGRAM  Treatments: LUMBAR DISCECTOMY  Discharge Exam: Blood pressure 128/76, pulse 74, temperature 97.6 F (36.4 C), temperature source Oral, resp. rate 18, height 5\' 9"  (1.753 m), weight 112.038 kg (247 lb), SpO2 99 %. NO WEAKNESS.  Disposition: 01-Home or Self Care     Medication List    ASK your doctor about these medications        chlorthalidone 25 MG tablet  Commonly known as:  HYGROTON  Take 25 mg by mouth daily.     cloNIDine 0.1 MG tablet  Commonly known as:  CATAPRES  Take 0.1 mg by mouth 2 (two) times daily.     gabapentin 400 MG capsule  Commonly known as:  NEURONTIN  Take 400 mg by mouth 2 (two) times daily.     insulin glargine 100 UNIT/ML injection  Commonly known as:  LANTUS  Inject 75 Units into the skin at bedtime.     losartan 100 MG tablet  Commonly known as:  COZAAR  Take 100 mg by mouth daily.     metFORMIN 1000 MG tablet  Commonly known as:  GLUCOPHAGE  Take 1,000 mg by mouth 2 (two) times daily with a meal.     potassium chloride 10 MEQ CR capsule  Commonly known as:  MICRO-K  Take 10 mEq by mouth daily as needed.     pravastatin 40 MG tablet  Commonly known as:  PRAVACHOL  Take 40 mg by mouth at bedtime.     promethazine 25 MG tablet  Commonly known as:  PHENERGAN  Take 1 tablet (25 mg total) by mouth every 6 (six) hours as needed for nausea or vomiting.     propranolol 10 MG tablet  Commonly known as:  INDERAL  Take 2 tablets (20 mg total) by mouth 2 (two) times daily.     TRADJENTA 5 MG Tabs tablet  Generic drug:  linagliptin  Take 5 mg by mouth daily.          Signed: [name] 02/08/2015, 10:58 AM

## 2015-02-08 NOTE — Progress Notes (Signed)
Patient discharged home transported by daughter and son in law, no complaints of pain, IV removed discharge summary explained patient awaiting volunteer for transport.

## 2015-02-08 NOTE — Progress Notes (Signed)
Physical Therapy Treatment Patient Details Name: [name] MRN: [id number] DOB: [birth year] Today's Date: 02/08/2015    History of Present Illness 81 y.o female, s/p L4-L5 diskectomy, PMH: L3-4 laminectomy, R total knee arthroplasty, arthritis    PT Comments    Pt doing well today and demonstrates good safety with mobility.  Pt is ready for D/C from PT stand point.    Follow Up Recommendations  Home health PT;Supervision - Intermittent     Equipment Recommendations  None recommended by PT    Recommendations for Other Services       Precautions / Restrictions Precautions Precautions: Back Precaution Comments: pt able to verbalize back precautions. Restrictions Weight Bearing Restrictions: No    Mobility  Bed Mobility               General bed mobility comments: Sitting EOB with Nsg.  Transfers Overall transfer level: Modified independent Equipment used: Rolling walker (2 wheeled) Transfers: Sit to/from Stand              Ambulation/Gait Ambulation/Gait assistance: Modified independent (Device/Increase time) Ambulation Distance (Feet): 300 Feet Assistive device: Rolling walker (2 wheeled) Gait Pattern/deviations: Step-through pattern;Decreased stride length     General Gait Details: pt continues to have guarded slow gait, but demonstrates good safety.     Stairs Stairs: Yes Stairs assistance: Supervision Stair Management: Two rails;Forwards;Step to pattern Number of Stairs: 2    Wheelchair Mobility    Modified Rankin (Stroke Patients Only)       Balance Overall balance assessment: Needs assistance         Standing balance support: No upper extremity supported;During functional activity Standing balance-Leahy Scale: Fair                      Cognition Arousal/Alertness: Awake/alert Behavior During Therapy: WFL for tasks assessed/performed Overall Cognitive Status: Within Functional Limits for tasks assessed                       Exercises      General Comments        Pertinent Vitals/Pain Pain Assessment: 0-10 Pain Score: 1  Pain Location: Back Pain Descriptors / Indicators: Dull Pain Intervention(s): Monitored during session;Repositioned    Home Living                      Prior Function            PT Goals (current goals can now be found in the care plan section) Acute Rehab PT Goals Patient Stated Goal: to go home PT Goal Formulation: With patient Time For Goal Achievement: 02/14/15 Potential to Achieve Goals: Good Progress towards PT goals: Progressing toward goals    Frequency  Min 5X/week    PT Plan Current plan remains appropriate    Co-evaluation             End of Session Equipment Utilized During Treatment: Gait belt Activity Tolerance: Patient tolerated treatment well Patient left: in chair;with call bell/phone within reach     Time: 0826-0849 PT Time Calculation (min) (ACUTE ONLY): 23 min  Charges:  $Gait Training: 23-37 mins                    G CodesCatarina [name], [place] 02/08/2015, 9:53 AM

## 2015-03-12 ENCOUNTER — Ambulatory Visit: Payer: Medicare Other | Admitting: Neurology

## 2015-03-13 ENCOUNTER — Ambulatory Visit: Payer: Self-pay | Admitting: Neurology

## 2015-04-01 ENCOUNTER — Ambulatory Visit (INDEPENDENT_AMBULATORY_CARE_PROVIDER_SITE_OTHER): Payer: Medicare Other | Admitting: Neurology

## 2015-04-01 ENCOUNTER — Encounter: Payer: Self-pay | Admitting: Neurology

## 2015-04-01 VITALS — BP 153/77 | HR 77 | Ht 69.0 in | Wt 242.0 lb

## 2015-04-01 DIAGNOSIS — M545 Low back pain, unspecified: Secondary | ICD-10-CM

## 2015-04-01 DIAGNOSIS — G250 Essential tremor: Secondary | ICD-10-CM | POA: Diagnosis not present

## 2015-04-01 DIAGNOSIS — G8929 Other chronic pain: Secondary | ICD-10-CM | POA: Diagnosis not present

## 2015-04-01 HISTORY — DX: Essential tremor: G25.0

## 2015-04-01 MED ORDER — PROPRANOLOL HCL 20 MG PO TABS: 20.0000 mg | ORAL_TABLET | Freq: Two times a day (BID) | ORAL | Status: DC

## 2015-04-01 NOTE — Progress Notes (Signed)
Reason for visit: Essential tremor  [name] is an 81 y.o. female  History of present illness:  [name] is an 81 year old right-handed white female with a history of an essential tremor that has been present for about 4 or 5 years. The patient has been on low-dose propranolol, and this seems to help significantly. She is on 20 mg twice daily, tolerating the medication well. She denies that the tremor impairs her ability to perform any activities of daily living. Occasionally, if she rolls on her left side at night, she will develop a tremor that affects the jaw which keeps her awake. The patient can eliminate the tremor by rolling on her back or on her right side. She has had 2 lumbar spine surgeries, one in the summer of 2015, and one in June 2016. She continues to have discomfort with weightbearing, she was told by Dr. Joya Salm that she had arthritis involving the back. She will develop back pain shortly after she stands up, the back pain gets better with sitting or lying down. The patient was told to engage in water aerobics, but she is concerned about her balance issues around the pool. The patient uses a walker for ambulation outside the house. She oftentimes will have to sit on the walker to rest because of the back pain. She returns for an evaluation.  Past Medical History  Diagnosis Date  . Hypertension   . Hyperlipemia   . Arthritis   . Cancer     skin  . Staph infection     1970's  . Essential and other specified forms of tremor 05/03/2013  . Cholelithiasis   . Chronic insomnia   . Chronic low back pain   . Obesity   . Peripheral edema   . Wrist fracture     left October 2014  . Hx of acute renal failure 02/2014    admitted to [facility]  . DDD (degenerative disc disease), lumbar   . Hx: UTI (urinary tract infection) 02/2014  . Diabetes mellitus     Iddm x 12 years  . Diabetic peripheral neuropathy   . Pneumonia     as a child  . GERD (gastroesophageal reflux  disease)   . Umbilical hernia   . Anemia     as a teenager  . Complication of anesthesia   . PONV (postoperative nausea and vomiting)     pt states Zofran not that helpful in the past, Phenergan works best for her  . Essential tremor 04/01/2015    Past Surgical History  Procedure Laterality Date  . Abdominal hysterectomy    . Hernia repair    . Total knee arthroplasty Right   . Cataract extraction Bilateral     w/ lens implants  . Oophorectomy    . Neck surgery    . Cardiac catheterization  2000    Dr Einar Gip  . Back surgery  2005  . Eye surgery    . Colonoscopy  >10 years  . Lumbar laminectomy/decompression microdiscectomy Left 03/16/2014    Procedure: LUMBAR LAMINECTOMY/DECOMPRESSION MICRODISCECTOMY 1 LEVEL  lumbar level three/four;  Surgeon: [name], MD;  Location: [place];  Service: Neurosurgery;  Laterality: Left;  . Lumbar laminectomy/decompression microdiscectomy Right 02/06/2015    Procedure: Right L4-5 Microdiskectomy;  Surgeon: Leeroy Cha, MD;  Location: [place];  Service: Neurosurgery;  Laterality: Right;  Right L4-5 Microdiskectomy    Family History  Problem Relation Age of Onset  . Cancer Brother  prostate  . Heart disease Brother   . Alzheimer's disease Brother   . Stroke Mother   . Hypertension Mother   . COPD Brother   . Cancer Brother     Prostate cancer  . Hypertension Sister     Congestive heart failure  . Heart attack Son     Social history:  reports that she has never smoked. She has never used smokeless tobacco. She reports that she does not drink alcohol or use illicit drugs.    Allergies  Allergen Reactions  . Codeine Nausea And Vomiting  . Dilaudid [Hydromorphone Hcl] Nausea And Vomiting and Other (See Comments)    Severe sweating  . Ciprofloxacin Other (See Comments)    Acute kidney failure  . Mysoline [Primidone] Nausea Only  . Oxycodone Nausea And Vomiting    Medications:  Prior to Admission medications     Medication Sig Start Date End Date Taking? Authorizing Provider  chlorthalidone (HYGROTON) 25 MG tablet Take 25 mg by mouth daily.  01/23/14  Yes Historical Provider, MD  cloNIDine (CATAPRES) 0.1 MG tablet Take 0.1 mg by mouth 2 (two) times daily.     Yes Historical Provider, MD  gabapentin (NEURONTIN) 400 MG capsule Take 400 mg by mouth 2 (two) times daily.   Yes Historical Provider, MD  insulin glargine (LANTUS) 100 UNIT/ML injection Inject 75 Units into the skin at bedtime.    Yes Historical Provider, MD  losartan (COZAAR) 100 MG tablet Take 100 mg by mouth daily. 03/01/14  Yes Historical Provider, MD  metFORMIN (GLUCOPHAGE) 1000 MG tablet Take 1,000 mg by mouth 2 (two) times daily with a meal.    Yes Historical Provider, MD  potassium chloride (MICRO-K) 10 MEQ CR capsule Take 10 mEq by mouth daily as needed.   Yes Historical Provider, MD  pravastatin (PRAVACHOL) 40 MG tablet Take 40 mg by mouth at bedtime.  01/15/14  Yes Historical Provider, MD  promethazine (PHENERGAN) 25 MG tablet Take 1 tablet (25 mg total) by mouth every 6 (six) hours as needed for nausea or vomiting. 05/03/14  Yes Charlesetta Shanks, MD  propranolol (INDERAL) 10 MG tablet Take 2 tablets (20 mg total) by mouth 2 (two) times daily. 09/10/14  Yes Ward Givens, NP  TRADJENTA 5 MG TABS tablet Take 5 mg by mouth daily. 07/24/13  Yes Historical Provider, MD    ROS:  Out of a complete 14 system review of symptoms, the patient complains only of the following symptoms, and all other reviewed systems are negative.  Runny nose Incontinence of the bladder Chronic back pain Tremors  Blood pressure 153/77, pulse 77, height 5\' 9"  (1.753 m), weight 242 lb (109.77 kg).  Physical Exam  General: The patient is alert and cooperative at the time of the examination. The patient is moderately obese.  Skin: 2+ edema below the knees is noted bilaterally.   Neurologic Exam  Mental status: The patient is alert and oriented x 3 at the time  of the examination. The patient has apparent normal recent and remote memory, with an apparently normal attention span and concentration ability.   Cranial nerves: Facial symmetry is present. Speech is associated with a vocal tremor. Extraocular movements are full. Visual fields are full.  Motor: The patient has good strength in all 4 extremities.  Sensory examination: Soft touch sensation is symmetric on the face, arms, and legs.  Coordination: The patient has good finger-nose-finger and heel-to-shin bilaterally. No significant intention tremor seen with finger-nose-finger.  Gait and station:  The patient has a slightly wide-based, unsteady gait. The patient is able to ambulate independently. Tandem gait was not attempted. Romberg is negative. No drift is seen.  Reflexes: Deep tendon reflexes are symmetric, but are depressed.   Assessment/Plan:  1. Essential tremor  2. Gait disorder  3. Chronic low back pain  The patient is having issues primarily with her low back, not with the tremor. The tremor appears to be well controlled with the propranolol at the current dose. The patient will continue 20 mg twice daily of propranolol. I have given her a prescription for a lumbar support device to see if this will help her low back pain. She will follow-up in 6 months for an evaluation.  Jill Alexanders MD 04/01/2015 7:53 PM  [facility] [address] [place], [place] [address]  Phone [phone] Fax [phone]

## 2015-04-01 NOTE — Patient Instructions (Addendum)
We will continue the Propranolol 20 mg twice daily for the tremor. Please contact our office if you think that you need more medication. For the low back pain, we will try a lumbar support device to see if this helps the pain when you're up on your feet. If this is helpful, you may continue to use this. We will see you back in 6 months.   Back Pain, Adult Low back pain is very common. About 1 in 5 people have back pain.The cause of low back pain is rarely dangerous. The pain often gets better over time.About half of people with a sudden onset of back pain feel better in just 2 weeks. About 8 in 10 people feel better by 6 weeks.  CAUSES Some common causes of back pain include:  Strain of the muscles or ligaments supporting the spine.  Wear and tear (degeneration) of the spinal discs.  Arthritis.  Direct injury to the back. DIAGNOSIS Most of the time, the direct cause of low back pain is not known.However, back pain can be treated effectively even when the exact cause of the pain is unknown.Answering your caregiver's questions about your overall health and symptoms is one of the most accurate ways to make sure the cause of your pain is not dangerous. If your caregiver needs more information, he or she may order lab work or imaging tests (X-rays or MRIs).However, even if imaging tests show changes in your back, this usually does not require surgery. HOME CARE INSTRUCTIONS For many people, back pain returns.Since low back pain is rarely dangerous, it is often a condition that people can learn to [facility] their own.   Remain active. It is stressful on the back to sit or stand in one place. Do not sit, drive, or stand in one place for more than 30 minutes at a time. Take short walks on level surfaces as soon as pain allows.Try to increase the length of time you walk each day.  Do not stay in bed.Resting more than 1 or 2 days can delay your recovery.  Do not avoid exercise or  work.Your body is made to move.It is not dangerous to be active, even though your back may hurt.Your back will likely heal faster if you return to being active before your pain is gone.  Pay attention to your body when you bend and lift. Many people have less discomfortwhen lifting if they bend their knees, keep the load close to their bodies,and avoid twisting. Often, the most comfortable positions are those that put less stress on your recovering back.  Find a comfortable position to sleep. Use a firm mattress and lie on your side with your knees slightly bent. If you lie on your back, put a pillow under your knees.  Only take over-the-counter or prescription medicines as directed by your caregiver. Over-the-counter medicines to reduce pain and inflammation are often the most helpful.Your caregiver may prescribe muscle relaxant drugs.These medicines help dull your pain so you can more quickly return to your normal activities and healthy exercise.  Put ice on the injured area.  Put ice in a plastic bag.  Place a towel between your skin and the bag.  Leave the ice on for 15-20 minutes, 03-04 times a day for the first 2 to 3 days. After that, ice and heat may be alternated to reduce pain and spasms.  Ask your caregiver about trying back exercises and gentle massage. This may be of some benefit.  Avoid feeling anxious  or stressed.Stress increases muscle tension and can worsen back pain.It is important to recognize when you are anxious or stressed and learn ways to manage it.Exercise is a great option. SEEK MEDICAL CARE IF:  You have pain that is not relieved with rest or medicine.  You have pain that does not improve in 1 week.  You have new symptoms.  You are generally not feeling well. SEEK IMMEDIATE MEDICAL CARE IF:   You have pain that radiates from your back into your legs.  You develop new bowel or bladder control problems.  You have unusual weakness or numbness in  your arms or legs.  You develop nausea or vomiting.  You develop abdominal pain.  You feel faint. Document Released: 08/24/2005 Document Revised: 02/23/2012 Document Reviewed: 12/26/2013 [facility] Patient Information 2015 [place], [place]. This information is not intended to replace advice given to you by your health care provider. Make sure you discuss any questions you have with your health care provider.

## 2015-06-06 ENCOUNTER — Encounter (HOSPITAL_COMMUNITY): Payer: Self-pay | Admitting: Nurse Practitioner

## 2015-06-06 ENCOUNTER — Emergency Department (HOSPITAL_COMMUNITY)
Admission: EM | Admit: 2015-06-06 | Discharge: 2015-06-06 | Disposition: A | Discharge status: Home / Self Care | Payer: Medicare Other | Attending: Emergency Medicine | Admitting: Emergency Medicine

## 2015-06-06 ENCOUNTER — Emergency Department (HOSPITAL_COMMUNITY): Payer: Medicare Other

## 2015-06-06 DIAGNOSIS — M1990 Unspecified osteoarthritis, unspecified site: Secondary | ICD-10-CM | POA: Diagnosis not present

## 2015-06-06 DIAGNOSIS — Z8701 Personal history of pneumonia (recurrent): Secondary | ICD-10-CM | POA: Diagnosis not present

## 2015-06-06 DIAGNOSIS — Z8619 Personal history of other infectious and parasitic diseases: Secondary | ICD-10-CM | POA: Diagnosis not present

## 2015-06-06 DIAGNOSIS — E785 Hyperlipidemia, unspecified: Secondary | ICD-10-CM | POA: Diagnosis not present

## 2015-06-06 DIAGNOSIS — Y92009 Unspecified place in unspecified non-institutional (private) residence as the place of occurrence of the external cause: Secondary | ICD-10-CM | POA: Diagnosis not present

## 2015-06-06 DIAGNOSIS — G8929 Other chronic pain: Secondary | ICD-10-CM | POA: Insufficient documentation

## 2015-06-06 DIAGNOSIS — E119 Type 2 diabetes mellitus without complications: Secondary | ICD-10-CM | POA: Insufficient documentation

## 2015-06-06 DIAGNOSIS — Z79899 Other long term (current) drug therapy: Secondary | ICD-10-CM | POA: Insufficient documentation

## 2015-06-06 DIAGNOSIS — G629 Polyneuropathy, unspecified: Secondary | ICD-10-CM | POA: Diagnosis not present

## 2015-06-06 DIAGNOSIS — Z87440 Personal history of urinary (tract) infections: Secondary | ICD-10-CM | POA: Insufficient documentation

## 2015-06-06 DIAGNOSIS — S3992XA Unspecified injury of lower back, initial encounter: Secondary | ICD-10-CM | POA: Diagnosis present

## 2015-06-06 DIAGNOSIS — Z8719 Personal history of other diseases of the digestive system: Secondary | ICD-10-CM | POA: Diagnosis not present

## 2015-06-06 DIAGNOSIS — M545 Low back pain: Secondary | ICD-10-CM

## 2015-06-06 DIAGNOSIS — Y998 Other external cause status: Secondary | ICD-10-CM | POA: Diagnosis not present

## 2015-06-06 DIAGNOSIS — W1839XA Other fall on same level, initial encounter: Secondary | ICD-10-CM | POA: Insufficient documentation

## 2015-06-06 DIAGNOSIS — Y9389 Activity, other specified: Secondary | ICD-10-CM | POA: Insufficient documentation

## 2015-06-06 DIAGNOSIS — E669 Obesity, unspecified: Secondary | ICD-10-CM | POA: Insufficient documentation

## 2015-06-06 DIAGNOSIS — Z87442 Personal history of urinary calculi: Secondary | ICD-10-CM | POA: Diagnosis not present

## 2015-06-06 DIAGNOSIS — Z85828 Personal history of other malignant neoplasm of skin: Secondary | ICD-10-CM | POA: Insufficient documentation

## 2015-06-06 DIAGNOSIS — I10 Essential (primary) hypertension: Secondary | ICD-10-CM | POA: Diagnosis not present

## 2015-06-06 MED ORDER — FENTANYL CITRATE (PF) 100 MCG/2ML IJ SOLN
50.0000 ug | Freq: Once | INTRAMUSCULAR | Status: AC
  Administered 2015-06-06: 50 ug via INTRAMUSCULAR
  Filled 2015-06-06: qty 2

## 2015-06-06 MED ORDER — ONDANSETRON 4 MG PO TBDP
4.0000 mg | ORAL_TABLET | Freq: Once | ORAL | Status: AC
  Administered 2015-06-06: 4 mg via ORAL
  Filled 2015-06-06: qty 1

## 2015-06-06 MED ORDER — TRAMADOL HCL 50 MG PO TABS: 50.0000 mg | ORAL_TABLET | Freq: Four times a day (QID) | ORAL | Status: DC | PRN

## 2015-06-06 MED ORDER — IBUPROFEN 200 MG PO TABS
400.0000 mg | ORAL_TABLET | Freq: Once | ORAL | Status: AC
  Administered 2015-06-06: 400 mg via ORAL
  Filled 2015-06-06: qty 2

## 2015-06-06 NOTE — Discharge Instructions (Signed)
Back Injury Prevention Back injuries can be extremely painful and difficult to heal. After having one back injury, you are much more likely to experience another later on. It is important to learn how to avoid injuring or re-injuring your back. The following tips can help you to prevent a back injury. PHYSICAL FITNESS  Exercise regularly and try to develop good tone in your abdominal muscles. Your abdominal muscles provide a lot of the support needed by your back.  Do aerobic exercises (walking, jogging, biking, swimming) regularly.  Do exercises that increase balance and strength (tai chi, yoga) regularly. This can decrease your risk of falling and injuring your back.  Stretch before and after exercising.  Maintain a healthy weight. The more you weigh, the more stress is placed on your back. For every pound of weight, 10 times that amount of pressure is placed on the back. DIET  Talk to your caregiver about how much calcium and vitamin D you need per day. These nutrients help to prevent weakening of the bones (osteoporosis). Osteoporosis can cause broken (fractured) bones that lead to back pain.  Include good sources of calcium in your diet, such as dairy products, green, leafy vegetables, and products with calcium added (fortified).  Include good sources of vitamin D in your diet, such as milk and foods that are fortified with vitamin D.  Consider taking a nutritional supplement or a multivitamin if needed.  Stop smoking if you smoke. POSTURE  Sit and stand up straight. Avoid leaning forward when you sit or hunching over when you stand.  Choose chairs with good low back (lumbar) support.  If you work at a desk, sit close to your work so you do not need to lean over. Keep your chin tucked in. Keep your neck drawn back and elbows bent at a right angle. Your arms should look like the letter "L."  Sit high and close to the steering wheel when you drive. Add a lumbar support to your car  seat if needed.  Avoid sitting or standing in one position for too long. Take breaks to get up, stretch, and walk around at least once every hour. Take breaks if you are driving for long periods of time.  Sleep on your side with your knees slightly bent, or sleep on your back with a pillow under your knees. Do not sleep on your stomach. LIFTING, TWISTING, AND REACHING  Avoid heavy lifting, especially repetitive lifting. If you must do heavy lifting:  Stretch before lifting.  Work slowly.  Rest between lifts.  Use carts and dollies to move objects when possible.  Make several small trips instead of carrying 1 heavy load.  Ask for help when you need it.  Ask for help when moving big, awkward objects.  Follow these steps when lifting:  Stand with your feet shoulder-width apart.  Get as close to the object as you can. Do not try to pick up heavy objects that are far from your body.  Use handles or lifting straps if they are available.  Bend at your knees. Squat down, but keep your heels off the floor.  Keep your shoulders pulled back, your chin tucked in, and your back straight.  Lift the object slowly, tightening the muscles in your legs, abdomen, and buttocks. Keep the object as close to the center of your body as possible.  When you put a load down, use these same guidelines in reverse.  Do not:  Lift the object above your waist.  Twist at the waist while lifting or carrying a load. Move your feet if you need to turn, not your waist.  Bend over without bending at your knees.  Avoid reaching over your head, across a table, or for an object on a high surface. OTHER TIPS  Avoid wet floors and keep sidewalks clear of ice to prevent falls.  Do not sleep on a mattress that is too soft or too hard.  Keep items that are used frequently within easy reach.  Put heavier objects on shelves at waist level and lighter objects on lower or higher shelves.  Find ways to  decrease your stress, such as exercise, massage, or relaxation techniques. Stress can build up in your muscles. Tense muscles are more vulnerable to injury.  Seek treatment for depression or anxiety if needed. These conditions can increase your risk of developing back pain. SEEK MEDICAL CARE IF:  You injure your back.  You have questions about diet, exercise, or other ways to prevent back injuries. MAKE SURE YOU:  Understand these instructions.  Will watch your condition.  Will get help right away if you are not doing well or get worse. Document Released: 10/01/2004 Document Revised: 11/16/2011 Document Reviewed: 10/05/2011 ExitCare Patient Information 2015 [employer]. This information is not intended to replace advice given to you by your health care provider. Make sure you discuss any questions you have with your health care provider.  

## 2015-06-06 NOTE — ED Notes (Signed)
Pt is presented from home, report of a fall, c/o of back pain.

## 2015-06-06 NOTE — ED Provider Notes (Signed)
CSN: [id number]     Arrival date & time 06/06/15  1958 History   First MD Initiated Contact with Patient 06/06/15 2012     Chief Complaint  Patient presents with  . Fall  . Back Pain     (Consider location/radiation/quality/duration/timing/severity/associated sxs/prior Treatment) HPI  82yF with L lower back pain. Fall shortly before arrival. Lost balance and fell backwards. Doesn't think hit head. Denies HA or neck pain. No numbness or tingling. No retention/incontinence. No intervention prior to arrival.   Past Medical History  Diagnosis Date  . Hypertension   . Hyperlipemia   . Arthritis   . Cancer     skin  . Staph infection     1970's  . Essential and other specified forms of tremor 05/03/2013  . Cholelithiasis   . Chronic insomnia   . Chronic low back pain   . Obesity   . Peripheral edema   . Wrist fracture     left October 2014  . Hx of acute renal failure 02/2014    admitted to [facility]  . DDD (degenerative disc disease), lumbar   . Hx: UTI (urinary tract infection) 02/2014  . Diabetes mellitus     Iddm x 12 years  . Diabetic peripheral neuropathy   . Pneumonia     as a child  . GERD (gastroesophageal reflux disease)   . Umbilical hernia   . Anemia     as a teenager  . Complication of anesthesia   . PONV (postoperative nausea and vomiting)     pt states Zofran not that helpful in the past, Phenergan works best for her  . Essential tremor 04/01/2015   Past Surgical History  Procedure Laterality Date  . Abdominal hysterectomy    . Hernia repair    . Total knee arthroplasty Right   . Cataract extraction Bilateral     w/ lens implants  . Oophorectomy    . Neck surgery    . Cardiac catheterization  2000    Dr Einar Gip  . Back surgery  2005  . Eye surgery    . Colonoscopy  >10 years  . Lumbar laminectomy/decompression microdiscectomy Left 03/16/2014    Procedure: LUMBAR LAMINECTOMY/DECOMPRESSION MICRODISCECTOMY 1 LEVEL  lumbar level three/four;  Surgeon:  [name], MD;  Location: [place];  Service: Neurosurgery;  Laterality: Left;  . Lumbar laminectomy/decompression microdiscectomy Right 02/06/2015    Procedure: Right L4-5 Microdiskectomy;  Surgeon: Leeroy Cha, MD;  Location: [place] [facility];  Service: Neurosurgery;  Laterality: Right;  Right L4-5 Microdiskectomy   Family History  Problem Relation Age of Onset  . Cancer Brother     prostate  . Heart disease Brother   . Alzheimer's disease Brother   . Stroke Mother   . Hypertension Mother   . COPD Brother   . Cancer Brother     Prostate cancer  . Hypertension Sister     Congestive heart failure  . Heart attack Son    Social History  Substance Use Topics  . Smoking status: Never Smoker   . Smokeless tobacco: Never Used  . Alcohol Use: No   OB History    No data available     Review of Systems  All systems reviewed and negative, other than as noted in HPI.   Allergies  Codeine; Dilaudid; Ciprofloxacin; Mysoline; and Oxycodone  Home Medications   Prior to Admission medications   Medication Sig Start Date End Date Taking? Authorizing Provider  chlorthalidone (HYGROTON) 25 MG  tablet Take 25 mg by mouth daily.  01/23/14  Yes Historical Provider, MD  cloNIDine (CATAPRES) 0.1 MG tablet Take 0.1 mg by mouth 2 (two) times daily.     Yes Historical Provider, MD  gabapentin (NEURONTIN) 400 MG capsule Take 400 mg by mouth 2 (two) times daily.   Yes Historical Provider, MD  HUMALOG MIX 75/25 KWIKPEN (75-25) 100 UNIT/ML Kwikpen Inject 25-50 Units into the skin 2 (two) times daily. Inject 50 units in the am and Inject 25 in the pm. 06/04/15  Yes Historical Provider, MD  losartan (COZAAR) 100 MG tablet Take 100 mg by mouth daily.   Yes Historical Provider, MD  metFORMIN (GLUCOPHAGE) 1000 MG tablet Take 1,000 mg by mouth 2 (two) times daily with a meal.    Yes Historical Provider, MD  pravastatin (PRAVACHOL) 40 MG tablet Take 40 mg by mouth at bedtime.  01/15/14  Yes Historical Provider,  MD  propranolol (INDERAL) 10 MG tablet TAKE 2 TABLETS (20 MG TOTAL) BY MOUTH 2 (TWO) TIMES DAILY. 03/26/15  Yes Historical Provider, MD  TRADJENTA 5 MG TABS tablet Take 5 mg by mouth daily. 07/24/13  Yes Historical Provider, MD  promethazine (PHENERGAN) 25 MG tablet Take 1 tablet (25 mg total) by mouth every 6 (six) hours as needed for nausea or vomiting. Patient not taking: Reported on 06/06/2015 05/03/14   Charlesetta Shanks, MD  propranolol (INDERAL) 20 MG tablet Take 1 tablet (20 mg total) by mouth 2 (two) times daily. Patient not taking: Reported on 06/06/2015 04/01/15   Kathrynn Ducking, MD   BP 158/51 mmHg  Pulse 88  Temp(Src) 99.4 F (37.4 C) (Oral)  Resp 20  Ht 5\' 9"  (1.753 m)  Wt 240 lb (108.863 kg)  BMI 35.43 kg/m2  SpO2 98% Physical Exam  Constitutional: She appears well-developed and well-nourished. No distress.  HENT:  Head: Normocephalic and atraumatic.  Eyes: Conjunctivae are normal. Right eye exhibits no discharge. Left eye exhibits no discharge.  Neck: Neck supple.  Cardiovascular: Normal rate, regular rhythm and normal heart sounds.  Exam reveals no gallop and no friction rub.   No murmur heard. Pulmonary/Chest: Effort normal and breath sounds normal. No respiratory distress.  Abdominal: Soft. She exhibits no distension. There is no tenderness.  Musculoskeletal: She exhibits no edema.  TTP L lower back and near L SI joint. Increased back pain with ROm of L hip. Strength normal b/l LE. Sensation intact to light touch. Palpable DP pulse. No midline cervical or thoracic spine tenderness.   Neurological: She is alert.  Skin: Skin is warm and dry.  Psychiatric: She has a normal mood and affect. Her behavior is normal. Thought content normal.  Nursing note and vitals reviewed.   ED Course  Procedures (including critical care time) Labs Review Labs Reviewed - No data to display  Imaging Review No results found.   Dg Lumbar Spine Complete  06/06/2015   CLINICAL DATA:   Low back pain for approximately 1 week, recent fall today with increasing back pain and left leg radiculopathy  EXAM: LUMBAR SPINE - COMPLETE 4+ VIEW  COMPARISON:  02/06/2015  FINDINGS: Five lumbar type vertebral bodies are well visualized. Vertebral body height is well maintained. Multilevel osteophytes are seen. Postsurgical changes are noted at L4. Rounded calcification is noted in the right upper quadrant most consistent with a gallstone. No acute bony abnormality is seen.  IMPRESSION: Degenerative change without acute abnormality.   Electronically Signed   By: Linus Mako.D.  On: 06/06/2015 21:51   Dg Hip Unilat With Pelvis 2-3 Views Left  06/06/2015   CLINICAL DATA:  Left hip pain following falls, initial encounter  EXAM: DG HIP (WITH OR WITHOUT PELVIS) 2-3V LEFT  COMPARISON:  None.  FINDINGS: No acute fracture or dislocation is noted. No soft tissue changes are seen. Degenerative changes are noted in the lumbar spine.  IMPRESSION: No acute abnormality noted.   Electronically Signed   By: Inez Catalina M.D.   On: 06/06/2015 21:51   I have personally reviewed and evaluated these images and lab results as part of my medical decision-making.   EKG Interpretation None      MDM   Final diagnoses:  Left low back pain, with sciatica presence unspecified    82yF with L lower back pain after fall. NVI. Negative imaging. Ambulating in ED. It has been determined that no acute conditions requiring further emergency intervention are present at this time. The patient has been advised of the diagnosis and plan. I reviewed any labs and imaging including any potential incidental findings. We have discussed signs and symptoms that warrant return to the ED and they are listed in the discharge instructions.      Virgel Manifold, MD 06/15/15 2212

## 2015-06-06 NOTE — ED Notes (Signed)
Bed: [facility] Expected date:  Expected time:  Means of arrival:  Comments: EMS elderly fall

## 2015-06-06 NOTE — ED Notes (Signed)
Observed pt ambulate from her treatment rm 5 to restroom 2 down the hall with minimal assistance. Stable gait noted.

## 2015-06-13 ENCOUNTER — Other Ambulatory Visit: Payer: Self-pay | Admitting: Neurosurgery

## 2015-06-13 DIAGNOSIS — M5136 Other intervertebral disc degeneration, lumbar region: Secondary | ICD-10-CM

## 2015-06-20 ENCOUNTER — Other Ambulatory Visit: Payer: Medicare Other

## 2015-06-20 ENCOUNTER — Ambulatory Visit
Admission: RE | Admit: 2015-06-20 | Discharge: 2015-06-20 | Disposition: A | Discharge status: Home / Self Care | Payer: Medicare Other | Source: Ambulatory Visit | Attending: Neurosurgery | Admitting: Neurosurgery

## 2015-06-20 DIAGNOSIS — M5136 Other intervertebral disc degeneration, lumbar region: Secondary | ICD-10-CM

## 2015-06-20 MED ORDER — METHYLPREDNISOLONE ACETATE 40 MG/ML INJ SUSP (RADIOLOG
120.0000 mg | Freq: Once | INTRAMUSCULAR | Status: AC
Start: 2015-06-20 — End: 2015-06-20
  Administered 2015-06-20: 120 mg via EPIDURAL

## 2015-06-20 MED ORDER — IOHEXOL 180 MG/ML  SOLN
1.0000 mL | Freq: Once | INTRAMUSCULAR | Status: DC | PRN
  Administered 2015-06-20: 1 mL via EPIDURAL

## 2015-06-20 NOTE — Discharge Instructions (Signed)

## 2015-07-08 ENCOUNTER — Telehealth: Payer: Self-pay | Admitting: Neurology

## 2015-07-08 ENCOUNTER — Other Ambulatory Visit: Payer: Self-pay

## 2015-07-08 MED ORDER — PROPRANOLOL HCL 20 MG PO TABS
20.0000 mg | ORAL_TABLET | Freq: Two times a day (BID) | ORAL | Status: DC
Start: 2015-07-08 — End: 2015-11-07

## 2015-07-08 NOTE — Telephone Encounter (Signed)
Patient called to advise that she lost prescription for propranolol (INDERAL) 20 MG tablet. Patient states she has looked all over her apartment for it and can't find it. Would like for Dr. Jannifer Franklin to call in to [facility].

## 2015-07-08 NOTE — Telephone Encounter (Signed)
I called the patient. She stated that she has been taking two of the 10 mg tablets of propranolol that she had twice daily, but is out of them and would like to fill the Rx Dr. Jannifer Franklin gave her for 20 mg tablets. She states she had a paper Rx, but she cannot find it. She would like another Rx for Propranolol 20 mg twice daily.

## 2015-07-08 NOTE — Telephone Encounter (Signed)
I called in a RF for the propranolol

## 2015-07-25 ENCOUNTER — Other Ambulatory Visit: Payer: Self-pay | Admitting: Neurosurgery

## 2015-07-25 DIAGNOSIS — M5136 Other intervertebral disc degeneration, lumbar region: Secondary | ICD-10-CM

## 2015-08-07 ENCOUNTER — Other Ambulatory Visit: Payer: Medicare Other

## 2015-08-09 ENCOUNTER — Other Ambulatory Visit: Payer: Self-pay | Admitting: Radiology

## 2015-08-09 ENCOUNTER — Ambulatory Visit
Admission: RE | Admit: 2015-08-09 | Discharge: 2015-08-09 | Disposition: A | Discharge status: Home / Self Care | Payer: Medicare Other | Source: Ambulatory Visit | Attending: Neurosurgery | Admitting: Neurosurgery

## 2015-08-09 DIAGNOSIS — M5136 Other intervertebral disc degeneration, lumbar region: Secondary | ICD-10-CM

## 2015-08-09 MED ORDER — METHYLPREDNISOLONE ACETATE 40 MG/ML INJ SUSP (RADIOLOG
120.0000 mg | Freq: Once | INTRAMUSCULAR | Status: AC
  Administered 2015-08-09: 120 mg via EPIDURAL

## 2015-08-09 MED ORDER — IOHEXOL 180 MG/ML  SOLN
1.0000 mL | Freq: Once | INTRAMUSCULAR | Status: AC | PRN
Start: 2015-08-09 — End: 2015-08-09
  Administered 2015-08-09: 1 mL via EPIDURAL

## 2015-10-02 ENCOUNTER — Ambulatory Visit: Payer: Medicare Other | Admitting: Adult Health

## 2015-10-14 ENCOUNTER — Encounter: Payer: Self-pay | Admitting: Adult Health

## 2015-10-14 ENCOUNTER — Ambulatory Visit (INDEPENDENT_AMBULATORY_CARE_PROVIDER_SITE_OTHER): Payer: Medicare Other | Admitting: Adult Health

## 2015-10-14 ENCOUNTER — Encounter (INDEPENDENT_AMBULATORY_CARE_PROVIDER_SITE_OTHER): Payer: Self-pay

## 2015-10-14 VITALS — BP 140/70 | HR 66 | Resp 20 | Ht 69.5 in | Wt 241.0 lb

## 2015-10-14 DIAGNOSIS — R269 Unspecified abnormalities of gait and mobility: Secondary | ICD-10-CM

## 2015-10-14 DIAGNOSIS — G250 Essential tremor: Secondary | ICD-10-CM | POA: Diagnosis not present

## 2015-10-14 NOTE — Progress Notes (Signed)
I have read the note, and I agree with the clinical assessment and plan.  Sho Salguero KEITH   

## 2015-10-14 NOTE — Progress Notes (Signed)
PATIENT: [name] DOB: 1933/01/23  REASON FOR VISIT: follow up- tremor HISTORY FROM: patient  HISTORY OF PRESENT ILLNESS:  [name] is an 82 year old female with a history of essential tremor. She returns today for an evaluation. The patient continues to take propranolol 20 mg twice a day. She states that this has been very beneficial for her tremor. She states that on occasion her blood pressure will run low and they have considered reducing some of her blood pressure medication. She states that she is able to feed herself and dress herself.  She does not feel that the essential tremor impedes her ADLs. She continues to operate a motor vehicle without difficulty. She states that she does feel that the right leg gets weaker at times due to her knee replacement. She reports that she uses a rolling walker when ambulate. She did have a fall back in September. She states that she fell backwards. She's not had any additional falls. She returns today for an evaluation.  HISTORY 04/01/15: [name] is an 82 year old right-handed white female with a history of an essential tremor that has been present for about 4 or 5 years. The patient has been on low-dose propranolol, and this seems to help significantly. She is on 20 mg twice daily, tolerating the medication well. She denies that the tremor impairs her ability to perform any activities of daily living. Occasionally, if she rolls on her left side at night, she will develop a tremor that affects the jaw which keeps her awake. The patient can eliminate the tremor by rolling on her back or on her right side. She has had 2 lumbar spine surgeries, one in the summer of 2015, and one in June 2016. She continues to have discomfort with weightbearing, she was told by Dr. Joya Salm that she had arthritis involving the back. She will develop back pain shortly after she stands up, the back pain gets better with sitting or lying down. The patient was told to engage  in water aerobics, but she is concerned about her balance issues around the pool. The patient uses a walker for ambulation outside the house. She oftentimes will have to sit on the walker to rest because of the back pain. She returns for an evaluation.  REVIEW OF SYSTEMS: Out of a complete 14 system review of symptoms, the patient complains only of the following symptoms, and all other reviewed systems are negative.   Incontinence of bladder, back pain, aching muscles, walking difficulty, tremors, daytime sleepiness  ALLERGIES: Allergies  Allergen Reactions  . Ciprofloxacin Other (See Comments)    Acute kidney failure  . Codeine Nausea And Vomiting  . Dilaudid [Hydromorphone Hcl] Nausea And Vomiting and Other (See Comments)    Severe sweating  . Mysoline [Primidone] Nausea Only  . Oxycodone Nausea And Vomiting    HOME MEDICATIONS: Outpatient Prescriptions Prior to Visit  Medication Sig Dispense Refill  . B-D ULTRAFINE III SHORT PEN 31G X 8 MM MISC USE 1 PEN NEEDLE SUBCUTANEOUSLY EVERY DAY  99  . chlorthalidone (HYGROTON) 25 MG tablet Take 25 mg by mouth daily.     . cloNIDine (CATAPRES) 0.1 MG tablet Take 0.1 mg by mouth 2 (two) times daily.      [name] gabapentin (NEURONTIN) 400 MG capsule Take 400 mg by mouth 2 (two) times daily.    [name] HUMALOG MIX 75/25 KWIKPEN (75-25) 100 UNIT/ML Kwikpen Inject 25-50 Units into the skin 2 (two) times daily. Inject 50 units in the  am and Inject 25 in the pm.    . losartan (COZAAR) 100 MG tablet Take 100 mg by mouth daily.    . metFORMIN (GLUCOPHAGE) 1000 MG tablet Take 1,000 mg by mouth 2 (two) times daily with a meal.     . pravastatin (PRAVACHOL) 40 MG tablet Take 40 mg by mouth at bedtime.     . promethazine (PHENERGAN) 25 MG tablet Take 1 tablet (25 mg total) by mouth every 6 (six) hours as needed for nausea or vomiting. 20 tablet 0  . propranolol (INDERAL) 20 MG tablet Take 1 tablet (20 mg total) by mouth 2 (two) times daily. 60 tablet 3  . TRADJENTA  5 MG TABS tablet Take 5 mg by mouth daily.    . traMADol (ULTRAM) 50 MG tablet Take 1 tablet (50 mg total) by mouth every 6 (six) hours as needed. 10 tablet 0  . LANTUS SOLOSTAR 100 UNIT/ML Solostar Pen INJECT 75 UNITS EVERY MORNING  5   No facility-administered medications prior to visit.    PAST MEDICAL HISTORY: Past Medical History  Diagnosis Date  . Hypertension   . Hyperlipemia   . Arthritis   . Cancer ([place])     skin  . Staph infection     1970's  . Essential and other specified forms of tremor 05/03/2013  . Cholelithiasis   . Chronic insomnia   . Chronic low back pain   . Obesity   . Peripheral edema   . Wrist fracture     left October 2014  . Hx of acute renal failure 02/2014    admitted to [facility]  . DDD (degenerative disc disease), lumbar   . Hx: UTI (urinary tract infection) 02/2014  . Diabetes mellitus     Iddm x 12 years  . Diabetic peripheral neuropathy ([place])   . Pneumonia     as a child  . GERD (gastroesophageal reflux disease)   . Umbilical hernia   . Anemia     as a teenager  . Complication of anesthesia   . PONV (postoperative nausea and vomiting)     pt states Zofran not that helpful in the past, Phenergan works best for her  . Essential tremor 04/01/2015    PAST SURGICAL HISTORY: Past Surgical History  Procedure Laterality Date  . Abdominal hysterectomy    . Hernia repair    . Total knee arthroplasty Right   . Cataract extraction Bilateral     w/ lens implants  . Oophorectomy    . Neck surgery    . Cardiac catheterization  2000    Dr Einar Gip  . Back surgery  2005  . Eye surgery    . Colonoscopy  >10 years  . Lumbar laminectomy/decompression microdiscectomy Left 03/16/2014    Procedure: LUMBAR LAMINECTOMY/DECOMPRESSION MICRODISCECTOMY 1 LEVEL  lumbar level three/four;  Surgeon: [name], MD;  Location: [place];  Service: Neurosurgery;  Laterality: Left;  . Lumbar laminectomy/decompression microdiscectomy Right 02/06/2015    Procedure:  Right L4-5 Microdiskectomy;  Surgeon: Leeroy Cha, MD;  Location: [place] [facility];  Service: Neurosurgery;  Laterality: Right;  Right L4-5 Microdiskectomy    FAMILY HISTORY: Family History  Problem Relation Age of Onset  . Cancer Brother     prostate  . Heart disease Brother   . Alzheimer's disease Brother   . Stroke Mother   . Hypertension Mother   . COPD Brother   . Cancer Brother     Prostate cancer  . Hypertension Sister  Congestive heart failure  . Heart attack Son     SOCIAL HISTORY: Social History   Social History  . Marital Status: Widowed    Spouse Name: N/A  . Number of Children: 3  . Years of Education: 16   Occupational History  . Retired     Social History Main Topics  . Smoking status: Never Smoker   . Smokeless tobacco: Never Used  . Alcohol Use: No  . Drug Use: No  . Sexual Activity: No   Other Topics Concern  . Not on file   Social History Narrative   Patient lives at home alone.    Patient is widowed.    Patient has 3 children and 2 step children.    Patient is right handed.    Patient has a high school education and CNA certificate.     Patient is retired.    Patient does not drink caffeine.      PHYSICAL EXAM  Filed Vitals:   10/14/15 0950  BP: 140/70  Pulse: 66  Resp: 20  Height: 5' 9.5" (1.765 m)  Weight: 241 lb (109.317 kg)   Body mass index is 35.09 kg/(m^2).  Generalized: Well developed, in no acute distress   Neurological examination  Mentation: Alert oriented to time, place, history taking. Follows all commands speech and language fluent.  Very mild tremor in the voice Cranial nerve II-XII: Pupils were equal round reactive to light. Extraocular movements were full, visual field were full on confrontational test. Facial sensation and strength were normal. Uvula tongue midline. Head turning and shoulder shrug  were normal and symmetric. Motor: The motor testing reveals 5 over 5 strength of all 4 extremities. Good  symmetric motor tone is noted throughout.  Very mild intention tremor in hands. Sensory: Sensory testing is intact to soft touch on all 4 extremities. No evidence of extinction is noted.  Coordination: Cerebellar testing reveals good finger-nose-finger and heel-to-shin bilaterally.  Gait and station:  Gait is very cautious. She uses a rolling walker when ambulating. Tandem gait not attempted. Romberg is negative. Reflexes: Deep tendon reflexes are symmetric  But depressed throughout   DIAGNOSTIC DATA (LABS, IMAGING, TESTING) - I reviewed patient records, labs, notes, testing and imaging myself where available.  Lab Results  Component Value Date   WBC 5.9 01/31/2015   HGB 12.0 01/31/2015   HCT 35.3* 01/31/2015   MCV 83.3 01/31/2015   PLT 170 01/31/2015      Component Value Date/Time   NA 139 01/31/2015 1050   K 4.3 01/31/2015 1050   CL 105 01/31/2015 1050   CO2 22 01/31/2015 1050   GLUCOSE 262* 01/31/2015 1050   BUN 31* 01/31/2015 1050   CREATININE 1.16* 01/31/2015 1050   CALCIUM 9.1 01/31/2015 1050   PROT 6.9 03/17/2014 2341   ALBUMIN 3.4* 03/17/2014 2341   AST 21 03/17/2014 2341   ALT 11 03/17/2014 2341   ALKPHOS 62 03/17/2014 2341   BILITOT 0.4 03/17/2014 2341   GFRNONAA 35* 01/31/2015 1050   GFRAA 50* 01/31/2015 1050        ASSESSMENT AND PLAN 82 y.o. year old female  has a past medical history of Hypertension; Hyperlipemia; Arthritis; Cancer ([place]); Staph infection; Essential and other specified forms of tremor (05/03/2013); Cholelithiasis; Chronic insomnia; Chronic low back pain; Obesity; Peripheral edema; Wrist fracture; acute renal failure (02/2014); DDD (degenerative disc disease), lumbar; UTI (urinary tract infection) (02/2014); Diabetes mellitus; Diabetic peripheral neuropathy ([place]); Pneumonia; GERD (gastroesophageal reflux disease); Umbilical hernia; Anemia; Complication  of anesthesia; PONV (postoperative nausea and vomiting); and Essential tremor (04/01/2015). here  with:  1.  Essential tremor  2. Abnormality of gait and balance   The patient will continue on propranolol 20 mg twice a day for the tremor. This has remained stable.  I have recommended physical therapy for balance training as well as strength training for the right leg. The patient deferred on this for now. She plans to look into water aerobics at the [facility]. She is advised that if her symptoms worsen or she develops new symptoms she should let us know. She will follow-up in 6 months or sooner if needed.    Ward Givens, MSN, NP-C 10/14/2015, 9:58 AM [facility] [address] [place], [place] [address] [phone]

## 2015-10-14 NOTE — Patient Instructions (Signed)
Continue propranolol 20 mg twice a day If your symptoms worsen or you develop new symptoms please let us know.

## 2015-11-07 ENCOUNTER — Other Ambulatory Visit: Payer: Self-pay | Admitting: *Deleted

## 2015-11-07 MED ORDER — PROPRANOLOL HCL 20 MG PO TABS: 20.0000 mg | ORAL_TABLET | Freq: Two times a day (BID) | ORAL | Status: DC

## 2015-12-21 ENCOUNTER — Emergency Department (HOSPITAL_COMMUNITY): Payer: Medicare Other

## 2015-12-21 ENCOUNTER — Encounter (HOSPITAL_COMMUNITY): Payer: Self-pay | Admitting: Nurse Practitioner

## 2015-12-21 ENCOUNTER — Emergency Department (HOSPITAL_COMMUNITY)
Admission: EM | Admit: 2015-12-21 | Discharge: 2015-12-21 | Disposition: A | Discharge status: Home / Self Care | Payer: Medicare Other | Attending: Emergency Medicine | Admitting: Emergency Medicine

## 2015-12-21 DIAGNOSIS — Z87440 Personal history of urinary (tract) infections: Secondary | ICD-10-CM | POA: Diagnosis not present

## 2015-12-21 DIAGNOSIS — W01198A Fall on same level from slipping, tripping and stumbling with subsequent striking against other object, initial encounter: Secondary | ICD-10-CM | POA: Diagnosis not present

## 2015-12-21 DIAGNOSIS — Z8701 Personal history of pneumonia (recurrent): Secondary | ICD-10-CM | POA: Insufficient documentation

## 2015-12-21 DIAGNOSIS — Y9389 Activity, other specified: Secondary | ICD-10-CM | POA: Diagnosis not present

## 2015-12-21 DIAGNOSIS — S0990XA Unspecified injury of head, initial encounter: Secondary | ICD-10-CM | POA: Diagnosis not present

## 2015-12-21 DIAGNOSIS — Z8719 Personal history of other diseases of the digestive system: Secondary | ICD-10-CM | POA: Diagnosis not present

## 2015-12-21 DIAGNOSIS — E669 Obesity, unspecified: Secondary | ICD-10-CM | POA: Diagnosis not present

## 2015-12-21 DIAGNOSIS — S52515A Nondisplaced fracture of left radial styloid process, initial encounter for closed fracture: Secondary | ICD-10-CM

## 2015-12-21 DIAGNOSIS — Z87448 Personal history of other diseases of urinary system: Secondary | ICD-10-CM | POA: Diagnosis not present

## 2015-12-21 DIAGNOSIS — G8929 Other chronic pain: Secondary | ICD-10-CM | POA: Insufficient documentation

## 2015-12-21 DIAGNOSIS — M25561 Pain in right knee: Secondary | ICD-10-CM

## 2015-12-21 DIAGNOSIS — Y9289 Other specified places as the place of occurrence of the external cause: Secondary | ICD-10-CM | POA: Insufficient documentation

## 2015-12-21 DIAGNOSIS — Z8614 Personal history of Methicillin resistant Staphylococcus aureus infection: Secondary | ICD-10-CM | POA: Diagnosis not present

## 2015-12-21 DIAGNOSIS — Z7984 Long term (current) use of oral hypoglycemic drugs: Secondary | ICD-10-CM | POA: Insufficient documentation

## 2015-12-21 DIAGNOSIS — E1140 Type 2 diabetes mellitus with diabetic neuropathy, unspecified: Secondary | ICD-10-CM | POA: Diagnosis not present

## 2015-12-21 DIAGNOSIS — I10 Essential (primary) hypertension: Secondary | ICD-10-CM | POA: Diagnosis not present

## 2015-12-21 DIAGNOSIS — Z862 Personal history of diseases of the blood and blood-forming organs and certain disorders involving the immune mechanism: Secondary | ICD-10-CM | POA: Insufficient documentation

## 2015-12-21 DIAGNOSIS — S6992XA Unspecified injury of left wrist, hand and finger(s), initial encounter: Secondary | ICD-10-CM | POA: Diagnosis present

## 2015-12-21 DIAGNOSIS — Z8739 Personal history of other diseases of the musculoskeletal system and connective tissue: Secondary | ICD-10-CM | POA: Diagnosis not present

## 2015-12-21 DIAGNOSIS — Y998 Other external cause status: Secondary | ICD-10-CM | POA: Diagnosis not present

## 2015-12-21 DIAGNOSIS — S0081XA Abrasion of other part of head, initial encounter: Secondary | ICD-10-CM | POA: Insufficient documentation

## 2015-12-21 DIAGNOSIS — E785 Hyperlipidemia, unspecified: Secondary | ICD-10-CM | POA: Insufficient documentation

## 2015-12-21 DIAGNOSIS — Z85828 Personal history of other malignant neoplasm of skin: Secondary | ICD-10-CM | POA: Insufficient documentation

## 2015-12-21 DIAGNOSIS — Z79899 Other long term (current) drug therapy: Secondary | ICD-10-CM | POA: Insufficient documentation

## 2015-12-21 DIAGNOSIS — S80211A Abrasion, right knee, initial encounter: Secondary | ICD-10-CM | POA: Insufficient documentation

## 2015-12-21 DIAGNOSIS — S0083XA Contusion of other part of head, initial encounter: Secondary | ICD-10-CM | POA: Diagnosis not present

## 2015-12-21 NOTE — ED Notes (Signed)
Bed: [id number] Expected date:  Expected time:  Means of arrival:  Comments: EMS 82yo F Fall/ wrist pain / knee pain

## 2015-12-21 NOTE — ED Provider Notes (Signed)
By signing my name below, I, [name], attest that this documentation has been prepared under the direction and in the presence of [employer], DO. Electronically Signed: Randa [name], ED Scribe. 12/21/2015. 2:37 AM.  TIME SEEN: 2:34 AM   CHIEF COMPLAINT: Fall  HPI: HPI Comments: [name] is a 82 y.o. female with history of hypertension, hyperlipidemia who presents to the Emergency Department complaining of fall onset tonight PTA. Pt states she tripped over a basket in the floor. Pt states that she fell forward hitting her head on the bathroom door and landing on her outstretched left hand. Pt is complaining of left wrist pain, HA, and right knee pain with associated abrasions. Pt states that she does ambulate with a walker. She states that during her fall she was not ambulating with her walker. She states that she is right hand dominant. Pt denies LOC. Pt does report taking tylenol prior to falling. Pt denies anticoagulant use. She reports hx of left wrist fracture and that this pain feels similar. Has seen Dr. Caralyn Guile in the past for this but did not have surgery. She denies neck pain, back pain, numbness or focal weakness, chest pain or shortness of breath, abdominal pain.  ROS: See HPI Constitutional: no fever  Eyes: no drainage  ENT: no runny nose   Cardiovascular:  no chest pain  Resp: no SOB  GI: no vomiting GU: no dysuria Integumentary: no rash  Allergy: no hives  Musculoskeletal: no leg swelling  Neurological: no slurred speech ROS otherwise negative  PAST MEDICAL HISTORY/PAST SURGICAL HISTORY:  Past Medical History  Diagnosis Date  . Hypertension   . Hyperlipemia   . Arthritis   . Cancer ([place])     skin  . Staph infection     1970's  . Essential and other specified forms of tremor 05/03/2013  . Cholelithiasis   . Chronic insomnia   . Chronic low back pain   . Obesity   . Peripheral edema   . Wrist fracture     left October 2014  . Hx of acute  renal failure 02/2014    admitted to [facility]  . DDD (degenerative disc disease), lumbar   . Hx: UTI (urinary tract infection) 02/2014  . Diabetes mellitus     Iddm x 12 years  . Diabetic peripheral neuropathy ([place])   . Pneumonia     as a child  . GERD (gastroesophageal reflux disease)   . Umbilical hernia   . Anemia     as a teenager  . Complication of anesthesia   . PONV (postoperative nausea and vomiting)     pt states Zofran not that helpful in the past, Phenergan works best for her  . Essential tremor 04/01/2015    MEDICATIONS:  Prior to Admission medications   Medication Sig Start Date End Date Taking? Authorizing Provider  B-D ULTRAFINE III SHORT PEN 31G X 8 MM MISC USE 1 PEN NEEDLE SUBCUTANEOUSLY EVERY DAY 05/07/15   Historical Provider, MD  chlorthalidone (HYGROTON) 25 MG tablet Take 25 mg by mouth daily.  01/23/14   Historical Provider, MD  cloNIDine (CATAPRES) 0.1 MG tablet Take 0.1 mg by mouth 2 (two) times daily.      Historical Provider, MD  gabapentin (NEURONTIN) 400 MG capsule Take 400 mg by mouth 2 (two) times daily.    Historical Provider, MD  HUMALOG MIX 75/25 KWIKPEN (75-25) 100 UNIT/ML Kwikpen Inject 25-50 Units into the skin 2 (two) times daily. Inject 50  units in the am and Inject 25 in the pm. 06/04/15   Historical Provider, MD  losartan (COZAAR) 100 MG tablet Take 100 mg by mouth daily.    Historical Provider, MD  metFORMIN (GLUCOPHAGE) 1000 MG tablet Take 1,000 mg by mouth 2 (two) times daily with a meal.     Historical Provider, MD  pravastatin (PRAVACHOL) 40 MG tablet Take 40 mg by mouth at bedtime.  01/15/14   Historical Provider, MD  promethazine (PHENERGAN) 25 MG tablet Take 1 tablet (25 mg total) by mouth every 6 (six) hours as needed for nausea or vomiting. 05/03/14   Charlesetta Shanks, MD  propranolol (INDERAL) 20 MG tablet Take 1 tablet (20 mg total) by mouth 2 (two) times daily. 11/07/15   Kathrynn Ducking, MD  TRADJENTA 5 MG TABS tablet Take 5 mg by mouth  daily. 07/24/13   Historical Provider, MD  traMADol (ULTRAM) 50 MG tablet Take 1 tablet (50 mg total) by mouth every 6 (six) hours as needed. 06/06/15   Virgel Manifold, MD    ALLERGIES:  Allergies  Allergen Reactions  . Ciprofloxacin Other (See Comments)    Acute kidney failure  . Codeine Nausea And Vomiting  . Dilaudid [Hydromorphone Hcl] Nausea And Vomiting and Other (See Comments)    Severe sweating  . Mysoline [Primidone] Nausea Only  . Oxycodone Nausea And Vomiting    SOCIAL HISTORY:  Social History  Substance Use Topics  . Smoking status: Never Smoker   . Smokeless tobacco: Never Used  . Alcohol Use: No    FAMILY HISTORY: Family History  Problem Relation Age of Onset  . Cancer Brother     prostate  . Heart disease Brother   . Alzheimer's disease Brother   . Stroke Mother   . Hypertension Mother   . COPD Brother   . Cancer Brother     Prostate cancer  . Hypertension Sister     Congestive heart failure  . Heart attack Son     EXAM: BP 161/66 mmHg  Pulse 87  Temp(Src) 98 F (36.7 C) (Oral)  Resp 20  SpO2 97%   CONSTITUTIONAL: Alert and oriented and responds appropriately to questions. Well-appearing; well-nourished; GCS 15, Elderly  HEAD: Normocephalic; Abrasion and small hematoma to the right forehead EYES: Conjunctivae clear, PERRL, EOMI ENT: normal nose; no rhinorrhea; moist mucous membranes; pharynx without lesions noted; no dental injury; no septal hematoma NECK: Supple, no meningismus, no LAD; no midline spinal tenderness, step-off or deformity CARD: RRR; S1 and S2 appreciated; no murmurs, no clicks, no rubs, no gallops RESP: Normal chest excursion without splinting or tachypnea; breath sounds clear and equal bilaterally; no wheezes, no rhonchi, no rales; no hypoxia or respiratory distress CHEST:  chest wall stable, no crepitus or ecchymosis or deformity, nontender to palpation ABD/GI: Normal bowel sounds; non-distended; soft, non-tender, no rebound, no  guarding PELVIS:  stable, nontender to palpation BACK:  The back appears normal and is non-tender to palpation, there is no CVA tenderness; no midline spinal tenderness, step-off or deformity EXT: Normal ROM in all joints; non-tender to palpation; no edema; normal capillary refill; no cyanosis, no joint effusion, no ecchymosis or lacerations; abrasion that's TTP over right knee without deformity, ecchymosis or swelling. TTP over the left wrist diffusely with swelling and 2+ left radial pulse, normal grip strength on left, otherwise extremities are non tender without bony tenderness. Normal range of motion in all joints except for the left wrist secondary to pain. SKIN: Normal color for  age and race; warm NEURO: Moves all extremities equally, sensation to light touch intact diffusely, cranial nerves II through XII intact PSYCH: The patient's mood and manner are appropriate. Grooming and personal hygiene are appropriate.  MEDICAL DECISION MAKING: Patient here with mechanical fall. Will obtain CT imaging of her head, cervical spine, left wrist and right knee. She declines pain medication at this time.  ED PROGRESS: Patient's CT of her head, cervical spine show no acute injury other than small hematoma to the right forehead. She is neurologically intact. X-ray of the left wrist shows a nondisplaced radial styloid fracture. Will place her in a sugar tong splint. She is not having any specific tenderness over the scaphoid but is slightly tender to palpation throughout the entire left wrist. We'll have her follow-up with Dr. Caralyn Guile.  X-ray of the right knee shows no fracture or dislocation. Hardware from her total knee arthroplasty appears intact. Have offered her pain medication to go home with that she declines. Have advised her to use Tylenol as needed for pain. Recommended placing ice to areas that hurt. Discussed head injury return precautions with patient and her granddaughter.   At this time, I do not  feel there is any life-threatening condition present. I have reviewed and discussed all results (EKG, imaging, lab, urine as appropriate), exam findings with patient. I have reviewed nursing notes and appropriate previous records.  I feel the patient is safe to be discharged home without further emergent workup. Discussed usual and customary return precautions. Patient and family (if present) verbalize understanding and are comfortable with this plan.  Patient will follow-up with their primary care provider. If they do not have a primary care provider, information for follow-up has been provided to them. All questions have been answered.      SPLINT APPLICATION Date/Time: 123XX123 AM Authorized by: Nyra Jabs Consent: Verbal consent obtained. Risks and benefits: risks, benefits and alternatives were discussed Consent given by: patient Splint applied by: orthopedic technician Location details: Left wrist  Splint type: Sugar tong  Supplies used: Fiberglass Post-procedure: The splinted body part was neurovascularly unchanged following the procedure. Patient tolerance: Patient tolerated the procedure well with no immediate complications.     I personally performed the services described in this documentation, which was scribed in my presence. The recorded information has been reviewed and is accurate.    [place], DO 12/21/15 [phone]

## 2015-12-21 NOTE — Progress Notes (Signed)
Orthopedic Tech Progress Note Patient Details:  [name] [birth year] [id number]  Ortho Devices Type of Ortho Device: Arm sling, Sugartong splint Ortho Device/Splint Location: lue Ortho Device/Splint Interventions: Ordered, Application   Karolee Stamps 12/21/2015, 5:24 AM

## 2015-12-21 NOTE — Discharge Instructions (Signed)
You may take Tylenol 1000 mg every 6 hours as needed for pain.   Head Injury, Adult You have a head injury. Headaches and throwing up (vomiting) are common after a head injury. It should be easy to wake up from sleeping. Sometimes you must stay in the hospital. Most problems happen within the first 24 hours. Side effects may occur up to 7-10 days after the injury.  WHAT ARE THE TYPES OF HEAD INJURIES? Head injuries can be as minor as a bump. Some head injuries can be more severe. More severe head injuries include:  A jarring injury to the brain (concussion).  A bruise of the brain (contusion). This mean there is bleeding in the brain that can cause swelling.  A cracked skull (skull fracture).  Bleeding in the brain that collects, clots, and forms a bump (hematoma). WHEN SHOULD I GET HELP RIGHT AWAY?   You are confused or sleepy.  You cannot be woken up.  You feel sick to your stomach (nauseous) or keep throwing up (vomiting).  Your dizziness or unsteadiness is getting worse.  You have very bad, lasting headaches that are not helped by medicine. Take medicines only as told by your doctor.  You cannot use your arms or legs like normal.  You cannot walk.  You notice changes in the black spots in the center of the colored part of your eye (pupil).  You have clear or bloody fluid coming from your nose or ears.  You have trouble seeing. During the next 24 hours after the injury, you must stay with someone who can watch you. This person should get help right away (call 911 in the [place] if you start to shake and are not able to control it (have seizures), you pass out, or you are unable to wake up. HOW CAN I PREVENT A HEAD INJURY IN THE FUTURE?  Wear seat belts.  Wear a helmet while bike riding and playing sports like football.  Stay away from dangerous activities around the house. WHEN CAN I RETURN TO NORMAL ACTIVITIES AND ATHLETICS? See your doctor before doing these  activities. You should not do normal activities or play contact sports until 1 week after the following symptoms have stopped:  Headache that does not go away.  Dizziness.  Poor attention.  Confusion.  Memory problems.  Sickness to your stomach or throwing up.  Tiredness.  Fussiness.  Bothered by bright lights or loud noises.  Anxiousness or depression.  Restless sleep. MAKE SURE YOU:   Understand these instructions.  Will watch your condition.  Will get help right away if you are not doing well or get worse.   This information is not intended to replace advice given to you by your health care provider. Make sure you discuss any questions you have with your health care provider.   Document Released: 08/06/2008 Document Revised: 09/14/2014 Document Reviewed: 05/01/2013 Elsevier Interactive Patient Education 2016 Else[employer]  Radial Fracture A radial fracture is a break in the radius bone, which is the long bone of the forearm that is on the same side as your thumb. Your forearm is the part of your arm that is between your elbow and your wrist. It is made up of two bones: the radius and the ulna. Most radial fractures occur near the wrist (distal radialfracture) or near the elbow (radial head fracture). A distal radial fracture is the most common type of broken arm. This fracture usually occurs about an inch above the wrist. Fractures of  the middle part of the bone are less common. CAUSES  Falling with your arm outstretched is the most common cause of a radial fracture. Other causes include:  Car accidents.  Bike accidents.  A direct blow to the middle part of the radius. RISK FACTORS  You may be at greater risk for a distal radial fracture if you are 18 years of age or older.  You may be at greater risk for a radial head fracture if you are:  Female.  1-96 years old.  You may be at a greater risk for all types of radial fractures if you have a condition  that causes your bones to be weak or thin (osteoporosis). SIGNS AND SYMPTOMS A radial fracture causes pain immediately after the injury. Other signs and symptoms include:  An abnormal bend or bump in your arm (deformity).  Swelling.  Bruising.  Numbness or tingling.  Tenderness.  Limited movement. DIAGNOSIS  Your health care provider may diagnose a radial fracture based on:  Your symptoms.  Your medical history, including any recent injury.  A physical exam. Your health care provider will look for any deformity and feel for tenderness over the break. Your health care provider will also check whether the bone is out of place.  An X-ray exam to confirm the diagnosis and learn more about the type of fracture. TREATMENT The goals of treatment are to get the bone in proper position for healing and to keep it from moving so it will heal over time. Your treatment will depend on many factors, especially the type of fracture that you have.  If the fractured bone:  Is in the correct position (nondisplaced), you may only need to wear a cast or a splint.  Has a slightly displaced fracture, you may need to have the bones moved back into place manually (closed reduction) before the splint or cast is put on.  You may have a temporary splint before you have a plaster cast. The splint allows room for some swelling. After a few days, a cast can replace the splint.  You may have to wear the cast for about 6 weeks or as directed by your health care provider.  The cast may be changed after about 3 weeks or as directed by your health care provider.  After your cast is taken off, you may need physical therapy to regain full movement in your wrist or elbow.  You may need emergency surgery if you have:  A fractured bone that is out of position (displaced).  A fracture with multiple fragments (comminuted fracture).  A fracture that breaks the skin (open fracture). This type of fracture may  require surgical wires, plates, or screws to hold the bone in place.  You may have X-rays every couple of weeks to check on your healing. HOME CARE INSTRUCTIONS  Keep the injured arm above the level of your heart while you are sitting or lying down. This helps to reduce swelling and pain.  Apply ice to the injured area:  Put ice in a plastic bag.  Place a towel between your skin and the bag.  Leave the ice on for 20 minutes, 2-3 times per day.  Move your fingers often to avoid stiffness and to minimize swelling.  If you have a plaster or fiberglass cast:  Do not try to scratch the skin under the cast using sharp or pointed objects.  Check the skin around the cast every day. You may put lotion on any red  or sore areas.  Keep your cast dry and clean.  If you have a plaster splint:  Wear the splint as directed.  Loosen the elastic around the splint if your fingers become numb and tingle, or if they turn cold and blue.  Do not put pressure on any part of your cast until it is fully hardened. Rest your cast only on a pillow for the first 24 hours.  Protect your cast or splint while bathing or showering, as directed by your health care provider. Do not put your cast or splint into water.  Take medicines only as directed by your health care provider.  Return to activities, such as sports, as directed by your health care provider. Ask your health care provider what activities are safe for you.  Keep all follow-up visits as directed by your health care provider. This is important. SEEK MEDICAL CARE IF:  Your pain medicine is not helping.  Your cast gets damaged or it breaks.  Your cast becomes loose.  Your cast gets wet.  You have more severe pain or swelling than you did before the cast.  You have severe pain when stretching your fingers.  You continue to have pain or stiffness in your elbow or your wrist after your cast is taken off. SEEK IMMEDIATE MEDICAL CARE  IF:  You cannot move your fingers.  You lose feeling in your fingers or your hand.  Your hand or your fingers turn cold and pale or blue.  You notice a bad smell coming from your cast.  You have drainage from underneath your cast.  You have new stains from blood or drainage seeping through your cast.   This information is not intended to replace advice given to you by your health care provider. Make sure you discuss any questions you have with your health care provider.   Document Released: 02/04/2006 Document Revised: 09/14/2014 Document Reviewed: 02/16/2014 Elsevier Interactive Patient Education 2016 [employer] or Splint Care Casts and splints support injured limbs and keep bones from moving while they heal. It is important to care for your cast or splint at home.  HOME CARE INSTRUCTIONS  Keep the cast or splint uncovered during the drying period. It can take 24 to 48 hours to dry if it is made of plaster. A fiberglass cast will dry in less than 1 hour.  Do not rest the cast on anything harder than a pillow for the first 24 hours.  Do not put weight on your injured limb or apply pressure to the cast until your health care provider gives you permission.  Keep the cast or splint dry. Wet casts or splints can lose their shape and may not support the limb as well. A wet cast that has lost its shape can also create harmful pressure on your skin when it dries. Also, wet skin can become infected.  Cover the cast or splint with a plastic bag when bathing or when out in the rain or snow. If the cast is on the trunk of the body, take sponge baths until the cast is removed.  If your cast does become wet, dry it with a towel or a blow dryer on the cool setting only.  Keep your cast or splint clean. Soiled casts may be wiped with a moistened cloth.  Do not place any hard or soft foreign objects under your cast or splint, such as cotton, toilet paper, lotion, or powder.  Do not  try to scratch the  skin under the cast with any object. The object could get stuck inside the cast. Also, scratching could lead to an infection. If itching is a problem, use a blow dryer on a cool setting to relieve discomfort.  Do not trim or cut your cast or remove padding from inside of it.  Exercise all joints next to the injury that are not immobilized by the cast or splint. For example, if you have a long leg cast, exercise the hip joint and toes. If you have an arm cast or splint, exercise the shoulder, elbow, thumb, and fingers.  Elevate your injured arm or leg on 1 or 2 pillows for the first 1 to 3 days to decrease swelling and pain.It is best if you can comfortably elevate your cast so it is higher than your heart. SEEK MEDICAL CARE IF:   Your cast or splint cracks.  Your cast or splint is too tight or too loose.  You have unbearable itching inside the cast.  Your cast becomes wet or develops a soft spot or area.  You have a bad smell coming from inside your cast.  You get an object stuck under your cast.  Your skin around the cast becomes red or raw.  You have new pain or worsening pain after the cast has been applied. SEEK IMMEDIATE MEDICAL CARE IF:   You have fluid leaking through the cast.  You are unable to move your fingers or toes.  You have discolored (blue or white), cool, painful, or very swollen fingers or toes beyond the cast.  You have tingling or numbness around the injured area.  You have severe pain or pressure under the cast.  You have any difficulty with your breathing or have shortness of breath.  You have chest pain.   This information is not intended to replace advice given to you by your health care provider. Make sure you discuss any questions you have with your health care provider.   Document Released: 08/21/2000 Document Revised: 06/14/2013 Document Reviewed: 03/02/2013 Elsevier Interactive Patient Education [employer].

## 2015-12-21 NOTE — ED Notes (Signed)
Secretary notifying Ortho tech from [facility] to apply this pts splint.

## 2015-12-21 NOTE — ED Notes (Signed)
Pt had a fall after her shoes got caught on the rug. Reports left wrist pain, right knee pain and headache. Denies LOC, not on anticoagulants.

## 2015-12-24 ENCOUNTER — Other Ambulatory Visit: Payer: Self-pay | Admitting: Neurosurgery

## 2015-12-24 DIAGNOSIS — M5136 Other intervertebral disc degeneration, lumbar region: Secondary | ICD-10-CM

## 2016-01-06 ENCOUNTER — Ambulatory Visit
Admission: RE | Admit: 2016-01-06 | Discharge: 2016-01-06 | Disposition: A | Discharge status: Home / Self Care | Payer: Medicare Other | Source: Ambulatory Visit | Attending: Neurosurgery | Admitting: Neurosurgery

## 2016-01-06 DIAGNOSIS — M5136 Other intervertebral disc degeneration, lumbar region: Secondary | ICD-10-CM

## 2016-01-06 MED ORDER — METHYLPREDNISOLONE ACETATE 40 MG/ML INJ SUSP (RADIOLOG
120.0000 mg | Freq: Once | INTRAMUSCULAR | Status: AC
  Administered 2016-01-06: 120 mg via EPIDURAL

## 2016-01-06 MED ORDER — IOPAMIDOL (ISOVUE-M 200) INJECTION 41%
1.0000 mL | Freq: Once | INTRAMUSCULAR | Status: AC
  Administered 2016-01-06: 1 mL via EPIDURAL

## 2016-01-06 NOTE — Discharge Instructions (Signed)

## 2016-04-13 ENCOUNTER — Encounter: Payer: Self-pay | Admitting: Adult Health

## 2016-04-13 ENCOUNTER — Ambulatory Visit (INDEPENDENT_AMBULATORY_CARE_PROVIDER_SITE_OTHER): Payer: Medicare Other | Admitting: Adult Health

## 2016-04-13 VITALS — BP 165/80 | HR 78 | Ht 69.5 in | Wt 240.6 lb

## 2016-04-13 DIAGNOSIS — G250 Essential tremor: Secondary | ICD-10-CM | POA: Diagnosis not present

## 2016-04-13 DIAGNOSIS — R269 Unspecified abnormalities of gait and mobility: Secondary | ICD-10-CM | POA: Diagnosis not present

## 2016-04-13 MED ORDER — PROPRANOLOL HCL 20 MG PO TABS: 20.0000 mg | ORAL_TABLET | Freq: Two times a day (BID) | ORAL | 3 refills | Status: DC

## 2016-04-13 NOTE — Patient Instructions (Signed)
Continue Propranolol Use walker at all times Consider physical therapy If your symptoms worsen or you develop new symptoms please let us know.

## 2016-04-13 NOTE — Progress Notes (Signed)
PATIENT: [name] DOB: 1933-02-05  REASON FOR VISIT: follow up- essential tremor HISTORY FROM: patient  HISTORY OF PRESENT ILLNESS: [name] is an 82 year old female with a history of essential tremor. She returns today for follow-up. She continues on propranolol 20 mg twice a day. She states her blood pressure is also been under control. She states that she did have a fall back in April. She states that she fell forward hitting her head and fracturing her wrist. She states they did do CT scans of the head and neck that was unremarkable. She reports that she has a walker but does not use it at all times. She lives at home alone. She is able to complete all ADLs independently. She does not operate a motor vehicle. She returns today for an evaluation.  HISTORY 10/14/15 (MM):[name] is an 82 year old female with a history of essential tremor. She returns today for an evaluation. The patient continues to take propranolol 20 mg twice a day. She states that this has been very beneficial for her tremor. She states that on occasion her blood pressure will run low and they have considered reducing some of her blood pressure medication. She states that she is able to feed herself and dress herself.  She does not feel that the essential tremor impedes her ADLs. She continues to operate a motor vehicle without difficulty. She states that she does feel that the right leg gets weaker at times due to her knee replacement. She reports that she uses a rolling walker when ambulate. She did have a fall back in September. She states that she fell backwards. She's not had any additional falls. She returns today for an evaluation.  HISTORY 04/01/15: [name] is an 82 year old right-handed white female with a history of an essential tremor that has been present for about 4 or 5 years. The patient has been on low-dose propranolol, and this seems to help significantly. She is on 20 mg twice daily, tolerating the  medication well. She denies that the tremor impairs her ability to perform any activities of daily living. Occasionally, if she rolls on her left side at night, she will develop a tremor that affects the jaw which keeps her awake. The patient can eliminate the tremor by rolling on her back or on her right side. She has had 2 lumbar spine surgeries, one in the summer of 2015, and one in June 2016. She continues to have discomfort with weightbearing, she was told by Dr. Joya Salm that she had arthritis involving the back. She will develop back pain shortly after she stands up, the back pain gets better with sitting or lying down. The patient was told to engage in water aerobics, but she is concerned about her balance issues around the pool. The patient uses a walker for ambulation outside the house. She oftentimes will have to sit on the walker to rest because of the back pain. She returns for an evaluation.    REVIEW OF SYSTEMS: Out of a complete 14 system review of symptoms, the patient complains only of the following symptoms, and all other reviewed systems are negative.  See history of present illness  ALLERGIES: Allergies  Allergen Reactions  . Ciprofloxacin Other (See Comments)    Acute kidney failure  . Codeine Nausea And Vomiting  . Dilaudid [Hydromorphone Hcl] Nausea And Vomiting and Other (See Comments)    Severe sweating  . Mysoline [Primidone] Nausea Only  . Oxycodone Nausea And Vomiting  HOME MEDICATIONS: Outpatient Medications Prior to Visit  Medication Sig Dispense Refill  . acetaminophen (TYLENOL) 500 MG tablet Take 1,000 mg by mouth every 6 (six) hours as needed for moderate pain.    . B-D ULTRAFINE III SHORT PEN 31G X 8 MM MISC USE 1 PEN NEEDLE SUBCUTANEOUSLY EVERY DAY  99  . chlorthalidone (HYGROTON) 25 MG tablet Take 25 mg by mouth daily.     . cloNIDine (CATAPRES) 0.1 MG tablet Take 0.1 mg by mouth 2 (two) times daily.      [name] gabapentin (NEURONTIN) 400 MG capsule Take 400  mg by mouth 2 (two) times daily.    [name] HUMALOG MIX 75/25 KWIKPEN (75-25) 100 UNIT/ML Kwikpen Inject 25-50 Units into the skin 2 (two) times daily. Inject 50 units in the am and Inject 25 in the pm.    . losartan (COZAAR) 100 MG tablet Take 100 mg by mouth daily.    . metFORMIN (GLUCOPHAGE) 1000 MG tablet Take 1,000 mg by mouth 2 (two) times daily with a meal.     . pravastatin (PRAVACHOL) 40 MG tablet Take 40 mg by mouth at bedtime.     . promethazine (PHENERGAN) 25 MG tablet Take 1 tablet (25 mg total) by mouth every 6 (six) hours as needed for nausea or vomiting. 20 tablet 0  . propranolol (INDERAL) 20 MG tablet Take 1 tablet (20 mg total) by mouth 2 (two) times daily. 180 tablet 1  . TRADJENTA 5 MG TABS tablet Take 5 mg by mouth daily.    . traMADol (ULTRAM) 50 MG tablet Take 1 tablet (50 mg total) by mouth every 6 (six) hours as needed. 10 tablet 0   No facility-administered medications prior to visit.     PAST MEDICAL HISTORY: Past Medical History:  Diagnosis Date  . Anemia    as a teenager  . Arthritis   . Cancer ([place])    skin  . Cholelithiasis   . Chronic insomnia   . Chronic low back pain   . Complication of anesthesia   . DDD (degenerative disc disease), lumbar   . Diabetes mellitus    Iddm x 12 years  . Diabetic peripheral neuropathy ([place])   . Essential and other specified forms of tremor 05/03/2013  . Essential tremor 04/01/2015  . GERD (gastroesophageal reflux disease)   . Hx of acute renal failure 02/2014   admitted to [facility]  . Hx: UTI (urinary tract infection) 02/2014  . Hyperlipemia   . Hypertension   . Obesity   . Peripheral edema   . Pneumonia    as a child  . PONV (postoperative nausea and vomiting)    pt states Zofran not that helpful in the past, Phenergan works best for her  . Staph infection    1970's  . Umbilical hernia   . Wrist fracture    left October 2014    PAST SURGICAL HISTORY: Past Surgical History:  Procedure Laterality Date  .  ABDOMINAL HYSTERECTOMY    . BACK SURGERY  2005  . CARDIAC CATHETERIZATION  2000   Dr Einar Gip  . CATARACT EXTRACTION Bilateral    w/ lens implants  . COLONOSCOPY  >10 years  . EYE SURGERY    . HERNIA REPAIR    . LUMBAR LAMINECTOMY/DECOMPRESSION MICRODISCECTOMY Left 03/16/2014   Procedure: LUMBAR LAMINECTOMY/DECOMPRESSION MICRODISCECTOMY 1 LEVEL  lumbar level three/four;  Surgeon: [name], MD;  Location: [facility];  Service: Neurosurgery;  Laterality: Left;  . LUMBAR LAMINECTOMY/DECOMPRESSION MICRODISCECTOMY  Right 02/06/2015   Procedure: Right L4-5 Microdiskectomy;  Surgeon: Leeroy Cha, MD;  Location: [facility];  Service: Neurosurgery;  Laterality: Right;  Right L4-5 Microdiskectomy  . NECK SURGERY    . OOPHORECTOMY    . TOTAL KNEE ARTHROPLASTY Right     FAMILY HISTORY: Family History  Problem Relation Age of Onset  . Cancer Brother     prostate  . Heart disease Brother   . Alzheimer's disease Brother   . Stroke Mother   . Hypertension Mother   . COPD Brother   . Cancer Brother     Prostate cancer  . Hypertension Sister     Congestive heart failure  . Heart attack Son     SOCIAL HISTORY: Social History   Social History  . Marital status: Widowed    Spouse name: N/A  . Number of children: 3  . Years of education: 44   Occupational History  . Retired     Social History Main Topics  . Smoking status: Never Smoker  . Smokeless tobacco: Never Used  . Alcohol use No  . Drug use: No  . Sexual activity: No   Other Topics Concern  . Not on file   Social History Narrative   Patient lives at home alone.    Patient is widowed.    Patient has 3 children and 2 step children.    Patient is right handed.    Patient has a high school education and CNA certificate.     Patient is retired.    Patient does not drink caffeine.      PHYSICAL EXAM  Vitals:   04/13/16 1053  BP: (!) 165/80  Pulse: 78  Weight: 240 lb 9.6 oz (109.1 kg)  Height: 5' 9.5" (1.765 m)     Body mass index is 35.02 kg/m.  Generalized: Well developed, in no acute distress   Neurological examination  Mentation: Alert oriented to time, place, history taking. Follows all commands speech and language fluent Cranial nerve II-XII: Pupils were equal round reactive to light. Extraocular movements were full, visual field were full on confrontational test. Facial sensation and strength were normal. Uvula tongue midline. Head turning and shoulder shrug  were normal and symmetric. Motor: The motor testing reveals 5 over 5 strength of all 4 extremities. Good symmetric motor tone is noted throughout.  Sensory: Sensory testing is intact to soft touch on all 4 extremities. No evidence of extinction is noted.  Coordination: Cerebellar testing reveals good finger-nose-finger and heel-to-shin bilaterally.  Gait and station: Gait is Normal but cautious. Tandem gait not attempted. She did not bring her walker today..  Reflexes: Deep tendon reflexes are symmetric and normal bilaterally.   DIAGNOSTIC DATA (LABS, IMAGING, TESTING) - I reviewed patient records, labs, notes, testing and imaging myself where available.  Lab Results  Component Value Date   WBC 5.9 01/31/2015   HGB 12.0 01/31/2015   HCT 35.3 (L) 01/31/2015   MCV 83.3 01/31/2015   PLT 170 01/31/2015      Component Value Date/Time   NA 139 01/31/2015 1050   K 4.3 01/31/2015 1050   CL 105 01/31/2015 1050   CO2 22 01/31/2015 1050   GLUCOSE 262 (H) 01/31/2015 1050   BUN 31 (H) 01/31/2015 1050   CREATININE 1.16 (H) 01/31/2015 1050   CALCIUM 9.1 01/31/2015 1050   PROT 6.9 03/17/2014 2341   ALBUMIN 3.4 (L) 03/17/2014 2341   AST 21 03/17/2014 2341   ALT 11 03/17/2014 2341  ALKPHOS 62 03/17/2014 2341   BILITOT 0.4 03/17/2014 2341   GFRNONAA 43 (L) 01/31/2015 1050   GFRAA 50 (L) 01/31/2015 1050      ASSESSMENT AND PLAN 82 y.o. year old female  has a past medical history of Anemia; Arthritis; Cancer ([place]); Cholelithiasis;  Chronic insomnia; Chronic low back pain; Complication of anesthesia; DDD (degenerative disc disease), lumbar; Diabetes mellitus; Diabetic peripheral neuropathy ([place]); Essential and other specified forms of tremor (05/03/2013); Essential tremor (04/01/2015); GERD (gastroesophageal reflux disease); acute renal failure (02/2014); UTI (urinary tract infection) (02/2014); Hyperlipemia; Hypertension; Obesity; Peripheral edema; Pneumonia; PONV (postoperative nausea and vomiting); Staph infection; Umbilical hernia; and Wrist fracture. here with:  1. Essential tremor 2. Abnormality of gait  The patient will continue on propranolol 20 mg twice a day. Blood pressure today is a little elevated. I recommended physical therapy for gait and balance training however the patient deferred. She states that she does not have anyone to drive her to therapy. I recommended in-home therapy and she  deferred. Advised that she should use a walker at all times. Patient voiced understanding. She will follow-up in 6 months with Dr. Jannifer Franklin.     Ward Givens, MSN, NP-C 04/13/2016, 11:19 AM [facility] [address], [place], [place] [address] [phone]

## 2016-04-13 NOTE — Progress Notes (Signed)
I have read the note, and I agree with the clinical assessment and plan.  Micaela Stith KEITH   

## 2016-05-15 ENCOUNTER — Emergency Department (HOSPITAL_COMMUNITY): Payer: Medicare Other

## 2016-05-15 ENCOUNTER — Observation Stay (HOSPITAL_COMMUNITY)
Admission: EM | Admit: 2016-05-15 | Discharge: 2016-05-16 | Disposition: A | Discharge status: Home / Self Care | Payer: Medicare Other | Attending: Internal Medicine | Admitting: Internal Medicine

## 2016-05-15 ENCOUNTER — Encounter (HOSPITAL_COMMUNITY): Payer: Self-pay | Admitting: Emergency Medicine

## 2016-05-15 DIAGNOSIS — I10 Essential (primary) hypertension: Secondary | ICD-10-CM | POA: Diagnosis not present

## 2016-05-15 DIAGNOSIS — E669 Obesity, unspecified: Secondary | ICD-10-CM | POA: Diagnosis not present

## 2016-05-15 DIAGNOSIS — G250 Essential tremor: Secondary | ICD-10-CM | POA: Diagnosis present

## 2016-05-15 DIAGNOSIS — Z794 Long term (current) use of insulin: Secondary | ICD-10-CM | POA: Insufficient documentation

## 2016-05-15 DIAGNOSIS — R42 Dizziness and giddiness: Secondary | ICD-10-CM | POA: Diagnosis present

## 2016-05-15 DIAGNOSIS — Z85828 Personal history of other malignant neoplasm of skin: Secondary | ICD-10-CM | POA: Insufficient documentation

## 2016-05-15 DIAGNOSIS — R079 Chest pain, unspecified: Secondary | ICD-10-CM | POA: Diagnosis not present

## 2016-05-15 DIAGNOSIS — E1142 Type 2 diabetes mellitus with diabetic polyneuropathy: Secondary | ICD-10-CM | POA: Diagnosis not present

## 2016-05-15 DIAGNOSIS — R7989 Other specified abnormal findings of blood chemistry: Secondary | ICD-10-CM | POA: Insufficient documentation

## 2016-05-15 DIAGNOSIS — R072 Precordial pain: Secondary | ICD-10-CM | POA: Insufficient documentation

## 2016-05-15 DIAGNOSIS — E785 Hyperlipidemia, unspecified: Secondary | ICD-10-CM | POA: Diagnosis not present

## 2016-05-15 DIAGNOSIS — E119 Type 2 diabetes mellitus without complications: Secondary | ICD-10-CM

## 2016-05-15 DIAGNOSIS — Z96651 Presence of right artificial knee joint: Secondary | ICD-10-CM | POA: Diagnosis not present

## 2016-05-15 DIAGNOSIS — Z6835 Body mass index (BMI) 35.0-35.9, adult: Secondary | ICD-10-CM | POA: Diagnosis not present

## 2016-05-15 DIAGNOSIS — Z79899 Other long term (current) drug therapy: Secondary | ICD-10-CM | POA: Insufficient documentation

## 2016-05-15 DIAGNOSIS — Z66 Do not resuscitate: Secondary | ICD-10-CM | POA: Diagnosis not present

## 2016-05-15 DIAGNOSIS — R2681 Unsteadiness on feet: Secondary | ICD-10-CM

## 2016-05-15 DIAGNOSIS — E1151 Type 2 diabetes mellitus with diabetic peripheral angiopathy without gangrene: Secondary | ICD-10-CM | POA: Diagnosis not present

## 2016-05-15 LAB — CBC
HCT: 34.4 % — ABNORMAL LOW (ref 36.0–46.0)
Hemoglobin: 11.4 g/dL — ABNORMAL LOW (ref 12.0–15.0)
MCH: 27.7 pg (ref 26.0–34.0)
MCHC: 33.1 g/dL (ref 30.0–36.0)
MCV: 83.5 fL (ref 78.0–100.0)
Platelets: 202 10*3/uL (ref 150–400)
RBC: 4.12 MIL/uL (ref 3.87–5.11)
RDW: 13.7 % (ref 11.5–15.5)
WBC: 6.6 10*3/uL (ref 4.0–10.5)

## 2016-05-15 LAB — BASIC METABOLIC PANEL
Anion gap: 9 (ref 5–15)
BUN: 24 mg/dL — ABNORMAL HIGH (ref 6–20)
CO2: 24 mmol/L (ref 22–32)
Calcium: 9.1 mg/dL (ref 8.9–10.3)
Chloride: 107 mmol/L (ref 101–111)
Creatinine, Ser: 0.94 mg/dL (ref 0.44–1.00)
GFR calc Af Amer: >60 mL/min (ref >60)
GFR calc non Af Amer: 55 mL/min — ABNORMAL LOW (ref >60)
Glucose, Bld: 193 mg/dL — ABNORMAL HIGH (ref 65–99)
Potassium: 3.7 mmol/L (ref 3.5–5.1)
Sodium: 140 mmol/L (ref 135–145)

## 2016-05-15 LAB — URINALYSIS, ROUTINE W REFLEX MICROSCOPIC
Bilirubin Urine: NEGATIVE
Glucose, UA: 100 mg/dL — AB
Hgb urine dipstick: NEGATIVE
Ketones, ur: NEGATIVE mg/dL
Leukocytes, UA: NEGATIVE
Nitrite: NEGATIVE
Protein, ur: NEGATIVE mg/dL
Specific Gravity, Urine: 1.016 (ref 1.005–1.030)
pH: 5.5 (ref 5.0–8.0)

## 2016-05-15 LAB — I-STAT TROPONIN, ED: Troponin i, poc: 0.01 ng/mL (ref 0.00–0.08)

## 2016-05-15 LAB — GLUCOSE, CAPILLARY: Glucose-Capillary: 163 mg/dL — ABNORMAL HIGH (ref 65–99)

## 2016-05-15 MED ORDER — MORPHINE SULFATE (PF) 2 MG/ML IV SOLN: 2.0000 mg | INTRAVENOUS | Status: DC | PRN

## 2016-05-15 MED ORDER — GABAPENTIN 400 MG PO CAPS
400.0000 mg | ORAL_CAPSULE | Freq: Two times a day (BID) | ORAL | Status: DC
  Administered 2016-05-15 – 2016-05-16 (×2): 400 mg via ORAL
  Filled 2016-05-15 (×2): qty 1

## 2016-05-15 MED ORDER — INSULIN LISPRO PROT & LISPRO (75-25 MIX) 100 UNIT/ML KWIKPEN: 25.0000 [IU] | PEN_INJECTOR | Freq: Two times a day (BID) | SUBCUTANEOUS | Status: DC

## 2016-05-15 MED ORDER — INSULIN ASPART PROT & ASPART (70-30 MIX) 100 UNIT/ML ~~LOC~~ SUSP
50.0000 [IU] | Freq: Every day | SUBCUTANEOUS | Status: DC
  Filled 2016-05-15: qty 10

## 2016-05-15 MED ORDER — ENOXAPARIN SODIUM 40 MG/0.4ML ~~LOC~~ SOLN
40.0000 mg | Freq: Every day | SUBCUTANEOUS | Status: DC
  Administered 2016-05-15: 40 mg via SUBCUTANEOUS
  Filled 2016-05-15: qty 0.4

## 2016-05-15 MED ORDER — LACTATED RINGERS IV SOLN
INTRAVENOUS | Status: DC
  Administered 2016-05-15: 23:00:00 via INTRAVENOUS

## 2016-05-15 MED ORDER — GI COCKTAIL ~~LOC~~: 30.0000 mL | Freq: Four times a day (QID) | ORAL | Status: DC | PRN

## 2016-05-15 MED ORDER — PROPRANOLOL HCL 10 MG PO TABS
20.0000 mg | ORAL_TABLET | Freq: Two times a day (BID) | ORAL | Status: DC
  Administered 2016-05-15 – 2016-05-16 (×2): 20 mg via ORAL
  Filled 2016-05-15 (×3): qty 2

## 2016-05-15 MED ORDER — ONDANSETRON HCL 4 MG/2ML IJ SOLN: 4.0000 mg | Freq: Four times a day (QID) | INTRAMUSCULAR | Status: DC | PRN

## 2016-05-15 MED ORDER — ASPIRIN EC 325 MG PO TBEC
325.0000 mg | DELAYED_RELEASE_TABLET | Freq: Every day | ORAL | Status: DC
  Administered 2016-05-15 – 2016-05-16 (×2): 325 mg via ORAL
  Filled 2016-05-15 (×2): qty 1

## 2016-05-15 MED ORDER — LOSARTAN POTASSIUM 50 MG PO TABS
100.0000 mg | ORAL_TABLET | Freq: Every day | ORAL | Status: DC
  Administered 2016-05-16: 100 mg via ORAL
  Filled 2016-05-15: qty 2

## 2016-05-15 MED ORDER — ACETAMINOPHEN 325 MG PO TABS
650.0000 mg | ORAL_TABLET | ORAL | Status: DC | PRN
Start: 2016-05-15 — End: 2016-05-16
  Administered 2016-05-16: 650 mg via ORAL
  Filled 2016-05-15: qty 2

## 2016-05-15 MED ORDER — TRAMADOL HCL 50 MG PO TABS: 50.0000 mg | ORAL_TABLET | Freq: Four times a day (QID) | ORAL | Status: DC | PRN

## 2016-05-15 MED ORDER — CHLORTHALIDONE 25 MG PO TABS
25.0000 mg | ORAL_TABLET | Freq: Every day | ORAL | Status: DC
  Administered 2016-05-16: 25 mg via ORAL
  Filled 2016-05-15: qty 1

## 2016-05-15 MED ORDER — CLONIDINE HCL 0.1 MG PO TABS
0.1000 mg | ORAL_TABLET | Freq: Two times a day (BID) | ORAL | Status: DC
  Administered 2016-05-15 – 2016-05-16 (×2): 0.1 mg via ORAL
  Filled 2016-05-15 (×2): qty 1

## 2016-05-15 MED ORDER — INSULIN ASPART 100 UNIT/ML ~~LOC~~ SOLN
0.0000 [IU] | Freq: Three times a day (TID) | SUBCUTANEOUS | Status: DC
Start: 2016-05-16 — End: 2016-05-16
  Administered 2016-05-16 (×2): 2 [IU] via SUBCUTANEOUS

## 2016-05-15 MED ORDER — INSULIN ASPART PROT & ASPART (70-30 MIX) 100 UNIT/ML ~~LOC~~ SUSP
25.0000 [IU] | Freq: Every day | SUBCUTANEOUS | Status: DC
  Filled 2016-05-15: qty 10

## 2016-05-15 MED ORDER — PRAVASTATIN SODIUM 40 MG PO TABS: 40.0000 mg | ORAL_TABLET | Freq: Every day | ORAL | Status: DC

## 2016-05-15 MED ORDER — MECLIZINE HCL 25 MG PO TABS
25.0000 mg | ORAL_TABLET | Freq: Once | ORAL | Status: AC
  Administered 2016-05-15: 25 mg via ORAL
  Filled 2016-05-15: qty 1

## 2016-05-15 NOTE — H&P (Signed)
History and Physical    [name] [id number] DOB: 1932-11-14 DOA: 05/15/2016  PCP: Aretta Nip, MD Consultants:  Carlis Abbott - endocrinology; Jannifer Franklin - neurology Patient coming from: lives at home - lives alone in an apartment building for seniors; NOK: [name], [phone]  Chief Complaint: vertigo, chest pain  HPI: [name] is a 82 y.o. female with medical history significant of DM, HTN, HLD, and multiple other medical problems.  Patient awoke Wednesday AM and her balance was off.  Has fallen a few times in the past requiring ER visits (last in 4/17, hit bathroom door with her head and fractured left wrist).  No spinning, just "fuzzy like."  Also nauseated that AM.  Comes and goes, better in afternoons.  Also with a couple of episodes of substernal chest pain, lasted less than a minute.  Intermittent, can't tell when it will happen.  Also sometimes in left axillary region.  Was given Meclizine in ER after negative MRI.  Has essential tremor, which leads to balance issues.  Remote history of stress test/cath - 15+ years ago (Dr. Einar Gip).  Left eye was also blurry on Tuesday prior to onset of the other symptoms.  Has right leg nerve pain (peripheral neuropathy).   ED Course:  Per Dr. Oleta Mouse: 82 year old female who presents with elevated blood pressures, gait instability, and intermittent chest pain over the past 2 days. Chest pain-free on arrival, is hemodynamically stable and well appearing. Mildly hypertensive with systolic blood pressures in the 150s over 50s to 60s. Neurologically intact, but does have gait instability with ambulation, and she feels that even with a walker this is somewhat different than her baseline. Part of her symptoms are consistent with that of vertigo, worse with movement. She does have a significant amount of her stroke risk factors and I did perform a MRI showing no evidence of an acute stroke. I treated with meclizine for likely peripheral vertigo that  symptoms may also be symptomatic from her elevated blood pressures at home. In terms of her chest pain rule out, her EKG is nonischemic, and her troponin is negative. Chest x-ray shows no acute cardiopulmonary processes. No concern for PE or dissection symptoms are atypical for that at this time. She is high risk for ACS and has a elevated heart score above 3. Discussed with Dr. Lorin Mercy who will admit for cardiac rule out.    Review of Systems: As per HPI; otherwise 10 point review of systems reviewed and negative.   Ambulatory Status:  Walks with walker, more recently due to frequent falls  Past Medical History:  Diagnosis Date  . Anemia    as a teenager  . Arthritis   . Cancer ([place])    skin  . Cholelithiasis   . Chronic insomnia   . Chronic low back pain   . Complication of anesthesia   . DDD (degenerative disc disease), lumbar   . Diabetes mellitus    Iddm x 12 years  . Diabetic peripheral neuropathy ([place])   . Essential tremor 04/01/2015  . GERD (gastroesophageal reflux disease)   . Hx of acute renal failure 02/2014   admitted to [facility]  . Hx: UTI (urinary tract infection) 02/2014  . Hyperlipemia   . Hypertension   . Obesity   . Peripheral edema   . Pneumonia    as a child  . PONV (postoperative nausea and vomiting)    pt states Zofran not that helpful in the past, Phenergan works best for  her  . Staph infection    1970's  . Umbilical hernia   . Wrist fracture    left October 2014    Past Surgical History:  Procedure Laterality Date  . ABDOMINAL HYSTERECTOMY    . BACK SURGERY  2005  . CARDIAC CATHETERIZATION  2000   Dr Einar Gip  . CATARACT EXTRACTION Bilateral    w/ lens implants  . COLONOSCOPY  >10 years  . EYE SURGERY    . HERNIA REPAIR    . LUMBAR LAMINECTOMY/DECOMPRESSION MICRODISCECTOMY Left 03/16/2014   Procedure: LUMBAR LAMINECTOMY/DECOMPRESSION MICRODISCECTOMY 1 LEVEL  lumbar level three/four;  Surgeon: [name], MD;  Location: [place];  Service:  Neurosurgery;  Laterality: Left;  . LUMBAR LAMINECTOMY/DECOMPRESSION MICRODISCECTOMY Right 02/06/2015   Procedure: Right L4-5 Microdiskectomy;  Surgeon: Leeroy Cha, MD;  Location: [place] [facility];  Service: Neurosurgery;  Laterality: Right;  Right L4-5 Microdiskectomy  . NECK SURGERY    . OOPHORECTOMY    . TOTAL KNEE ARTHROPLASTY Right     Social History   Social History  . Marital status: Widowed    Spouse name: N/A  . Number of children: 3  . Years of education: 58   Occupational History  . Retired     Social History Main Topics  . Smoking status: Never Smoker  . Smokeless tobacco: Never Used  . Alcohol use No  . Drug use: No  . Sexual activity: No   Other Topics Concern  . Not on file   Social History Narrative   Patient lives at home alone.    Patient is widowed.    Patient has 3 children and 2 step children.    Patient is right handed.    Patient has a high school education and CNA certificate.     Patient is retired.    Patient does not drink caffeine.    Allergies  Allergen Reactions  . Ciprofloxacin Other (See Comments)    Acute kidney failure  . Codeine Nausea And Vomiting  . Dilaudid [Hydromorphone Hcl] Nausea And Vomiting and Other (See Comments)    Severe sweating  . Mysoline [Primidone] Nausea Only  . Oxycodone Nausea And Vomiting    Family History  Problem Relation Age of Onset  . Cancer Brother     prostate  . Heart disease Brother   . Alzheimer's disease Brother   . Stroke Mother   . Hypertension Mother   . COPD Brother   . Cancer Brother     Prostate cancer  . Hypertension Sister     Congestive heart failure  . Heart attack Son     Prior to Admission medications   Medication Sig Start Date End Date Taking? Authorizing Provider  acetaminophen (TYLENOL) 500 MG tablet Take 1,000 mg by mouth every 6 (six) hours as needed for moderate pain.   Yes Historical Provider, MD  chlorthalidone (HYGROTON) 25 MG tablet Take 25 mg by mouth daily.   01/23/14  Yes Historical Provider, MD  cloNIDine (CATAPRES) 0.1 MG tablet Take 0.1 mg by mouth 2 (two) times daily.     Yes Historical Provider, MD  gabapentin (NEURONTIN) 400 MG capsule Take 400 mg by mouth 2 (two) times daily.   Yes Historical Provider, MD  HUMALOG MIX 75/25 KWIKPEN (75-25) 100 UNIT/ML Kwikpen Inject 25-50 Units into the skin 2 (two) times daily. Inject 50 units in the am and Inject 25 in the pm. 06/04/15  Yes Historical Provider, MD  losartan (COZAAR) 100 MG tablet Take 100  mg by mouth daily.   Yes Historical Provider, MD  metFORMIN (GLUCOPHAGE) 1000 MG tablet Take 1,000 mg by mouth 2 (two) times daily with a meal.    Yes Historical Provider, MD  pravastatin (PRAVACHOL) 40 MG tablet Take 40 mg by mouth at bedtime.  01/15/14  Yes Historical Provider, MD  promethazine (PHENERGAN) 25 MG tablet Take 1 tablet (25 mg total) by mouth every 6 (six) hours as needed for nausea or vomiting. 05/03/14  Yes Charlesetta Shanks, MD  propranolol (INDERAL) 20 MG tablet Take 1 tablet (20 mg total) by mouth 2 (two) times daily. 04/13/16  Yes Ward Givens, NP  TRADJENTA 5 MG TABS tablet Take 5 mg by mouth daily. 07/24/13  Yes Historical Provider, MD  traMADol (ULTRAM) 50 MG tablet Take 1 tablet (50 mg total) by mouth every 6 (six) hours as needed. 06/06/15  Yes Virgel Manifold, MD  B-D ULTRAFINE III SHORT PEN 31G X 8 MM MISC USE 1 PEN NEEDLE SUBCUTANEOUSLY EVERY DAY 05/07/15   Historical Provider, MD    Physical Exam: Vitals:   05/15/16 1937 05/15/16 2200 05/15/16 2227 05/15/16 2342  BP: 154/59 138/58 (!) 169/75 (!) 144/65  Pulse: 71 77 77   Resp: 17 16 18    Temp:   98.1 F (36.7 C)   TempSrc:   Oral   SpO2: 95% 98% 97%   Weight:   109 kg (240 lb 4.8 oz)   Height:   5' 9.5" (1.765 m)      General: Appears calm and comfortable and is NAD; very conversant Eyes:  PERRL, EOMI, normal lids, iris ENT:  grossly normal hearing, lips & tongue, mmm Neck:  no LAD, masses or thyromegaly Cardiovascular:  RRR,  no m/r/g. No LE edema.  Respiratory:  CTA bilaterally, no w/r/r. Normal respiratory effort. Abdomen:  soft, ntnd, NABS Skin:  no rash or induration seen on limited exam Musculoskeletal:  grossly normal tone BUE/BLE, good ROM, no bony abnormality Psychiatric:  grossly normal mood and affect, speech fluent and appropriate, AOx3; somewhat tangential Neurologic:  CN 2-12 grossly intact, moves all extremities in coordinated fashion, sensation intact; resting tremor noted  Labs on Admission: I have personally reviewed following labs and imaging studies  CBC:  Recent Labs Lab 05/15/16 1516  WBC 6.6  HGB 11.4*  HCT 34.4*  MCV 83.5  PLT 123XX123   Basic Metabolic Panel:  Recent Labs Lab 05/15/16 1516  NA 140  K 3.7  CL 107  CO2 24  GLUCOSE 193*  BUN 24*  CREATININE 0.94  CALCIUM 9.1   GFR: Estimated Creatinine Clearance: 62.2 mL/min (by C-G formula based on SCr of 0.94 mg/dL). Liver Function Tests: No results for input(s): AST, ALT, ALKPHOS, BILITOT, PROT, ALBUMIN in the last 168 hours. No results for input(s): LIPASE, AMYLASE in the last 168 hours. No results for input(s): AMMONIA in the last 168 hours. Coagulation Profile: No results for input(s): INR, PROTIME in the last 168 hours. Cardiac Enzymes:  Recent Labs Lab 05/15/16 2325  TROPONINI <0.03   BNP (last 3 results) No results for input(s): PROBNP in the last 8760 hours. HbA1C: No results for input(s): HGBA1C in the last 72 hours. CBG:  Recent Labs Lab 05/15/16 2239  GLUCAP 163*   Lipid Profile: No results for input(s): CHOL, HDL, LDLCALC, TRIG, CHOLHDL, LDLDIRECT in the last 72 hours. Thyroid Function Tests: No results for input(s): TSH, T4TOTAL, FREET4, T3FREE, THYROIDAB in the last 72 hours. Anemia Panel: No results for input(s): VITAMINB12, FOLATE, FERRITIN,  TIBC, IRON, RETICCTPCT in the last 72 hours. Urine analysis:    Component Value Date/Time   COLORURINE YELLOW 05/15/2016 1702   APPEARANCEUR  CLEAR 05/15/2016 1702   LABSPEC 1.016 05/15/2016 1702   PHURINE 5.5 05/15/2016 1702   GLUCOSEU 100 (A) 05/15/2016 1702   HGBUR NEGATIVE 05/15/2016 1702   BILIRUBINUR NEGATIVE 05/15/2016 1702   KETONESUR NEGATIVE 05/15/2016 1702   PROTEINUR NEGATIVE 05/15/2016 1702   UROBILINOGEN 0.2 05/03/2014 0942   NITRITE NEGATIVE 05/15/2016 1702   LEUKOCYTESUR NEGATIVE 05/15/2016 1702    Creatinine Clearance: Estimated Creatinine Clearance: 62.2 mL/min (by C-G formula based on SCr of 0.94 mg/dL).  Sepsis Labs: @LABRCNTIP (procalcitonin:4,lacticidven:4) )No results found for this or any previous visit (from the past 240 hour(s)).   Radiological Exams on Admission: Dg Chest 2 View  Result Date: 05/15/2016 CLINICAL DATA:  Worsening shooting central chest pain. Altered mental status without headache. Intermittent nausea and unsteady gait. History of hypertension. EXAM: CHEST  2 VIEW COMPARISON:  Chest radiograph March 18, 2014 FINDINGS: Cardiomediastinal silhouette is normal. No pleural effusions or focal consolidations. Trachea projects midline and there is no pneumothorax. Soft tissue planes and included osseous structures are non-suspicious. ACDF. Mild degenerative change of thoracic spine. IMPRESSION: No acute cardiopulmonary process. Electronically Signed   By: Elon Alas M.D.   On: 05/15/2016 15:44   Mr Brain Wo Contrast  Result Date: 05/15/2016 CLINICAL DATA:  Gait instability and hypertension for 2 days. History of essential tremor, hypertension, diabetes, dyslipidemia. Fall in April with head injury. EXAM: MRI HEAD WITHOUT CONTRAST TECHNIQUE: Multiplanar, multiecho pulse sequences of the brain and surrounding structures were obtained without intravenous contrast. COMPARISON:  CT HEAD December 21, 2015 and MRI of the head April 18, 2006 FINDINGS: BRAIN: No reduced diffusion to suggest acute ischemia. No susceptibility artifact to suggest hemorrhage. The ventricles and sulci are normal for  patient's age. A few scattered subcentimeter supratentorial white matter FLAIR T2 hyperintensities are less than expected for age. No suspicious parenchymal signal, masses or mass effect. No abnormal extra-axial fluid collections. No extra-axial masses though, contrast enhanced sequences would be more sensitive. VASCULAR: Normal major intracranial vascular flow voids present at skull base. SKULL AND UPPER CERVICAL SPINE: No abnormal sellar expansion. No suspicious calvarial bone marrow signal. Craniocervical junction maintained. Partially imaged ACDF. SINUSES/ORBITS: The included ocular globes and orbital contents are non-suspicious. Status post bilateral ocular lens implants. The mastoid air-cells and included paranasal sinuses are well-aerated. OTHER: None. IMPRESSION: Negative MRI head for age. Electronically Signed   By: Elon Alas M.D.   On: 05/15/2016 20:10    EKG: Independently reviewed.  NSR with rate 76; NSCSLT  Assessment/Plan Principal Problem:   Chest pain Active Problems:   HTN (hypertension)   Diabetes mellitus (H[facility])   Dyslipidemia   Essential tremor   Vertigo   Chest pain -This was a minor problem that seemed to be mentioned as a side note -Non exertional, no associated symptoms -She does have CVD RF and only remote h/o stress test/cath -Will observe overnight and r/o with serial CE and repeat EKG in AM -If negative, would suggest further evaluation as outpatient -CXR unremarkable.   -Initial cardiac enzymes negative.   -EKG not indicative of acute ischemia.   -TIMI risk score is 2; which predicts a 14 days risk of death, recurrent MI, or urgent revascularization of 8.3%.  -ASA 325 mg PO daily -morphine given -Continue statin -Risk factor stratification with FLP and HgbA1c; will also check TSH  Vertigo -This was  primary presenting complaint -MRI was negative -Given Meclizine in ER -Can continue Meclizine if it is effective  HTN -Continue Chlorthalidone,  Catapres, Cozaar  DM -Hold oral agents -Continue Humalog and cover with SSI  HLD -Continue Pravachol  Essential tremor -Continue Inderal   DVT prophylaxis: Lovenox  Code Status: DNR - confirmed with patient/family Family Communication: family present throughout evaluation Disposition Plan:  Home once clinically improved Consults called: None  Admission status: Observation, telemetry    Karmen Bongo MD [facility]  If 7PM-7AM, please contact night-coverage [email or web address] Password [email or web address]  05/16/2016, 12:47 AM

## 2016-05-15 NOTE — ED Notes (Signed)
Pt denies pain at this time. A & O NAD, pt currently eating sandwich with no difficulty. Pt states she did not some dizziness with standing. Pt medicated per MD order

## 2016-05-15 NOTE — ED Triage Notes (Signed)
Pt complaint of worsening shooting central chest pain, hypertension, unsteady gait more than usual, swimming head without headache, and intermittent nausea. Pt denies dizziness.

## 2016-05-15 NOTE — ED Provider Notes (Signed)
[facility] DEPT Provider Note   CSN: [id number] Arrival date & time: 05/15/16  1438     History   Chief Complaint Chief Complaint  Patient presents with  . Chest Pain  . Gait Problem  . Hypertension    HPI [name] is a 82 y.o. female.  HPI 82 year old female who presents with gait instability and elevated BP x 2 days with intermittent chest pain. History of HTN, HLD, DM, and obesity. States 2 days ago, tried to get out of bed to walk to bathroom and was unable to do so because she was very unsteady, even walking with walker. Had many falls throughout the day, w/o head injury. Walks w/ walker, but states gait is much different from baseline. BP elevated around 170s/110-120s at home during this time. Denies dizziness, but states "funny feeling, swimmy feeling" in head. No headaches, double vision, difficulty swallowing, changes in speech, focal numbness or weakness.    Did have a few episodes of chest pain yesterday and today, with fluttering sensation in chest. Pain sharp, lasting a few seconds, disappearing, not associated with activity or exertion. No dyspnea, syncope, near syncope, or feeling lightheaded. No fever or chills. Feeling nauseous but no vomiting. No abd pain and with baseline low back pain due to lumbar disc herniation in the past.  Past Medical History:  Diagnosis Date  . Anemia    as a teenager  . Arthritis   . Cancer ([place])    skin  . Cholelithiasis   . Chronic insomnia   . Chronic low back pain   . Complication of anesthesia   . DDD (degenerative disc disease), lumbar   . Diabetes mellitus    Iddm x 12 years  . Diabetic peripheral neuropathy ([place])   . Essential and other specified forms of tremor 05/03/2013  . Essential tremor 04/01/2015  . GERD (gastroesophageal reflux disease)   . Hx of acute renal failure 02/2014   admitted to [facility]  . Hx: UTI (urinary tract infection) 02/2014  . Hyperlipemia   . Hypertension   . Obesity   . Peripheral  edema   . Pneumonia    as a child  . PONV (postoperative nausea and vomiting)    pt states Zofran not that helpful in the past, Phenergan works best for her  . Staph infection    1970's  . Umbilical hernia   . Wrist fracture    left October 2014    Patient Active Problem List   Diagnosis Date Noted  . Chest pain 05/15/2016  . Essential tremor 04/01/2015  . Chronic low back pain 04/01/2015  . Lumbar herniated disc 02/06/2015  . Lumbar radiculopathy 03/20/2014  . UTI (lower urinary tract infection) 03/18/2014  . Hyperglycemia 03/18/2014  . Dyslipidemia 03/18/2014  . Lumbar stenosis 03/16/2014  . HTN (hypertension) 02/12/2014  . Diabetes mellitus ([place]) 02/12/2014  . Acute renal failure ([place]) 02/11/2014  . H/O total knee replacement 08/11/2013  . Gallstones 10/01/2011    Past Surgical History:  Procedure Laterality Date  . ABDOMINAL HYSTERECTOMY    . BACK SURGERY  2005  . CARDIAC CATHETERIZATION  2000   Dr Einar Gip  . CATARACT EXTRACTION Bilateral    w/ lens implants  . COLONOSCOPY  >10 years  . EYE SURGERY    . HERNIA REPAIR    . LUMBAR LAMINECTOMY/DECOMPRESSION MICRODISCECTOMY Left 03/16/2014   Procedure: LUMBAR LAMINECTOMY/DECOMPRESSION MICRODISCECTOMY 1 LEVEL  lumbar level three/four;  Surgeon: [name], MD;  Location:  [facility] OR;  Service: Neurosurgery;  Laterality: Left;  . LUMBAR LAMINECTOMY/DECOMPRESSION MICRODISCECTOMY Right 02/06/2015   Procedure: Right L4-5 Microdiskectomy;  Surgeon: Leeroy Cha, MD;  Location: [facility];  Service: Neurosurgery;  Laterality: Right;  Right L4-5 Microdiskectomy  . NECK SURGERY    . OOPHORECTOMY    . TOTAL KNEE ARTHROPLASTY Right     OB History    No data available       Home Medications    Prior to Admission medications   Medication Sig Start Date End Date Taking? Authorizing Provider  acetaminophen (TYLENOL) 500 MG tablet Take 1,000 mg by mouth every 6 (six) hours as needed for moderate pain.   Yes Historical  Provider, MD  chlorthalidone (HYGROTON) 25 MG tablet Take 25 mg by mouth daily.  01/23/14  Yes Historical Provider, MD  cloNIDine (CATAPRES) 0.1 MG tablet Take 0.1 mg by mouth 2 (two) times daily.     Yes Historical Provider, MD  gabapentin (NEURONTIN) 400 MG capsule Take 400 mg by mouth 2 (two) times daily.   Yes Historical Provider, MD  HUMALOG MIX 75/25 KWIKPEN (75-25) 100 UNIT/ML Kwikpen Inject 25-50 Units into the skin 2 (two) times daily. Inject 50 units in the am and Inject 25 in the pm. 06/04/15  Yes Historical Provider, MD  losartan (COZAAR) 100 MG tablet Take 100 mg by mouth daily.   Yes Historical Provider, MD  metFORMIN (GLUCOPHAGE) 1000 MG tablet Take 1,000 mg by mouth 2 (two) times daily with a meal.    Yes Historical Provider, MD  pravastatin (PRAVACHOL) 40 MG tablet Take 40 mg by mouth at bedtime.  01/15/14  Yes Historical Provider, MD  promethazine (PHENERGAN) 25 MG tablet Take 1 tablet (25 mg total) by mouth every 6 (six) hours as needed for nausea or vomiting. 05/03/14  Yes Charlesetta Shanks, MD  propranolol (INDERAL) 20 MG tablet Take 1 tablet (20 mg total) by mouth 2 (two) times daily. 04/13/16  Yes Ward Givens, NP  TRADJENTA 5 MG TABS tablet Take 5 mg by mouth daily. 07/24/13  Yes Historical Provider, MD  traMADol (ULTRAM) 50 MG tablet Take 1 tablet (50 mg total) by mouth every 6 (six) hours as needed. 06/06/15  Yes Virgel Manifold, MD  B-D ULTRAFINE III SHORT PEN 31G X 8 MM MISC USE 1 PEN NEEDLE SUBCUTANEOUSLY EVERY DAY 05/07/15   Historical Provider, MD    Family History Family History  Problem Relation Age of Onset  . Cancer Brother     prostate  . Heart disease Brother   . Alzheimer's disease Brother   . Stroke Mother   . Hypertension Mother   . COPD Brother   . Cancer Brother     Prostate cancer  . Hypertension Sister     Congestive heart failure  . Heart attack Son     Social History Social History  Substance Use Topics  . Smoking status: Never Smoker  .  Smokeless tobacco: Never Used  . Alcohol use No     Allergies   Ciprofloxacin; Codeine; Dilaudid [hydromorphone hcl]; Mysoline [primidone]; and Oxycodone   Review of Systems Review of Systems Physical Exam  Nursing note and vitals reviewed. Constitutional: Well developed, well nourished, non-toxic, and in no acute distress Head: Normocephalic and atraumatic.  Mouth/Throat: Oropharynx is clear and moist.  Neck: Normal range of motion. Neck supple.  Cardiovascular: Normal rate and regular rhythm.   Pulmonary/Chest: Effort normal and breath sounds normal.  Abdominal: Soft. There is no tenderness. There is  no rebound and no guarding.  Musculoskeletal: Normal range of motion.  Skin: Skin is warm and dry.  Psychiatric: Cooperative Neurological:  Alert, oriented to person, place, time, and situation. Memory grossly in tact. Fluent speech. No dysarthria or aphasia.  Cranial nerves: VF are full.  Pupils are symmetric, and reactive to light. EOMI without nystagmus. No gaze deviation. Facial muscles symmetric with activation. Sensation to light touch over face in tact bilaterally. Hearing grossly in tact. Palate elevates symmetrically. Head turn and shoulder shrug are intact. Tongue midline.  Reflexes defered.  Muscle bulk and tone normal. No pronator drift. Moves all extremities symmetrically. Sensation to light touch is in tact throughout in bilateral upper and lower extremities. Coordination reveals no dysmetria with finger to nose. Gait deferred   Physical Exam Updated Vital Signs BP 154/59 (BP Location: Left Arm)   Pulse 71   Temp 98.3 F (36.8 C) (Oral)   Resp 17   Ht 5\' 9"  (1.753 m)   Wt 230 lb (104.3 kg)   SpO2 95%   BMI 33.97 kg/m   Physical Exam   ED Treatments / Results  Labs (all labs ordered are listed, but only abnormal results are displayed) Labs Reviewed  BASIC METABOLIC PANEL - Abnormal; Notable for the following:       Result Value   Glucose, Bld 193 (*)     BUN 24 (*)    GFR calc non Af Amer 55 (*)    All other components within normal limits  CBC - Abnormal; Notable for the following:    Hemoglobin 11.4 (*)    HCT 34.4 (*)    All other components within normal limits  URINALYSIS, ROUTINE W REFLEX MICROSCOPIC (NOT AT [facility]) - Abnormal; Notable for the following:    Glucose, UA 100 (*)    All other components within normal limits  I-STAT TROPOININ, ED    EKG  EKG Interpretation  Date/Time:  Friday May 15 2016 14:54:19 EDT Ventricular Rate:  76 PR Interval:    QRS Duration: 84 QT Interval:  374 QTC Calculation: 421 R Axis:   3 Text Interpretation:  Sinus rhythm Probable left atrial enlargement Low voltage, precordial leads Baseline wander in lead(s) II III aVF No significant change since last tracing Confirmed by KNAPP  MD-J, JON [id number]) on 05/15/2016 3:24:57 PM       Radiology Dg Chest 2 View  Result Date: 05/15/2016 CLINICAL DATA:  Worsening shooting central chest pain. Altered mental status without headache. Intermittent nausea and unsteady gait. History of hypertension. EXAM: CHEST  2 VIEW COMPARISON:  Chest radiograph March 18, 2014 FINDINGS: Cardiomediastinal silhouette is normal. No pleural effusions or focal consolidations. Trachea projects midline and there is no pneumothorax. Soft tissue planes and included osseous structures are non-suspicious. ACDF. Mild degenerative change of thoracic spine. IMPRESSION: No acute cardiopulmonary process. Electronically Signed   By: Elon Alas M.D.   On: 05/15/2016 15:44   Mr Brain Wo Contrast  Result Date: 05/15/2016 CLINICAL DATA:  Gait instability and hypertension for 2 days. History of essential tremor, hypertension, diabetes, dyslipidemia. Fall in April with head injury. EXAM: MRI HEAD WITHOUT CONTRAST TECHNIQUE: Multiplanar, multiecho pulse sequences of the brain and surrounding structures were obtained without intravenous contrast. COMPARISON:  CT HEAD December 21, 2015 and MRI  of the head April 18, 2006 FINDINGS: BRAIN: No reduced diffusion to suggest acute ischemia. No susceptibility artifact to suggest hemorrhage. The ventricles and sulci are normal for patient's age. A few scattered subcentimeter supratentorial  white matter FLAIR T2 hyperintensities are less than expected for age. No suspicious parenchymal signal, masses or mass effect. No abnormal extra-axial fluid collections. No extra-axial masses though, contrast enhanced sequences would be more sensitive. VASCULAR: Normal major intracranial vascular flow voids present at skull base. SKULL AND UPPER CERVICAL SPINE: No abnormal sellar expansion. No suspicious calvarial bone marrow signal. Craniocervical junction maintained. Partially imaged ACDF. SINUSES/ORBITS: The included ocular globes and orbital contents are non-suspicious. Status post bilateral ocular lens implants. The mastoid air-cells and included paranasal sinuses are well-aerated. OTHER: None. IMPRESSION: Negative MRI head for age. Electronically Signed   By: Elon Alas M.D.   On: 05/15/2016 20:10    Procedures Procedures (including critical care time)  Medications Ordered in ED Medications  meclizine (ANTIVERT) tablet 25 mg (25 mg Oral Given 05/15/16 2055)     Initial Impression / Assessment and Plan / ED Course  I have reviewed the triage vital signs and the nursing notes.  Pertinent labs & imaging results that were available during my care of the patient were reviewed by me and considered in my medical decision making (see chart for details).  Clinical Course    82 year old female who presents with elevated blood pressures, gait instability, and intermittent chest pain over the past 2 days. Chest pain-free on arrival, is hemodynamically stable and well appearing. Mildly hypertensive with systolic blood pressures in the 150s over 50s to 60s. Neurologically intact, but does have gait instability with ambulation, and she feels that even with a  walker this is somewhat different than her baseline. Part of her symptoms are consistent with that of vertigo, worse with movement. She does have a significant amount of her stroke risk factors and I did perform a MRI showing no evidence of an acute stroke. I treated with meclizine for likely peripheral vertigo that symptoms may also be symptomatic from her elevated blood pressures at home. In terms of her chest pain rule out, her EKG is nonischemic, and her troponin is negative. Chest x-ray shows no acute cardiopulmonary processes. No concern for PE or dissection symptoms are atypical for that at this time. She is high risk for ACS and has a elevated heart score above 3. Discussed with Dr. Lorin Mercy who will admit for cardiac rule out.   Final Clinical Impressions(s) / ED Diagnoses   Final diagnoses:  Gait instability  Chest pain, unspecified chest pain type    New Prescriptions New Prescriptions   No medications on file     Forde Dandy, MD 05/15/16 2117

## 2016-05-16 DIAGNOSIS — R072 Precordial pain: Secondary | ICD-10-CM | POA: Diagnosis not present

## 2016-05-16 DIAGNOSIS — E1151 Type 2 diabetes mellitus with diabetic peripheral angiopathy without gangrene: Secondary | ICD-10-CM | POA: Diagnosis not present

## 2016-05-16 DIAGNOSIS — R079 Chest pain, unspecified: Secondary | ICD-10-CM | POA: Diagnosis not present

## 2016-05-16 DIAGNOSIS — R42 Dizziness and giddiness: Secondary | ICD-10-CM | POA: Diagnosis not present

## 2016-05-16 DIAGNOSIS — G250 Essential tremor: Secondary | ICD-10-CM

## 2016-05-16 DIAGNOSIS — I10 Essential (primary) hypertension: Secondary | ICD-10-CM | POA: Diagnosis not present

## 2016-05-16 DIAGNOSIS — Z794 Long term (current) use of insulin: Secondary | ICD-10-CM

## 2016-05-16 DIAGNOSIS — E785 Hyperlipidemia, unspecified: Secondary | ICD-10-CM | POA: Diagnosis not present

## 2016-05-16 LAB — GLUCOSE, CAPILLARY
Glucose-Capillary: 135 mg/dL — ABNORMAL HIGH (ref 65–99)
Glucose-Capillary: 127 mg/dL — ABNORMAL HIGH (ref 65–99)

## 2016-05-16 LAB — LIPID PANEL
Cholesterol: 152 mg/dL (ref 0–200)
HDL: 38 mg/dL — ABNORMAL LOW (ref >40)
LDL Cholesterol: 57 mg/dL (ref 0–99)
Total CHOL/HDL Ratio: 4 RATIO
Triglycerides: 283 mg/dL — ABNORMAL HIGH (ref <150)
VLDL: 57 mg/dL — ABNORMAL HIGH (ref 0–40)

## 2016-05-16 LAB — TSH: TSH: 5.394 u[IU]/mL — ABNORMAL HIGH (ref 0.350–4.500)

## 2016-05-16 LAB — TROPONIN I
Troponin I: <0.03 ng/mL (ref <0.03)
Troponin I: <0.03 ng/mL (ref <0.03)
Troponin I: <0.03 ng/mL (ref <0.03)

## 2016-05-16 MED ORDER — PROMETHAZINE HCL 25 MG/ML IJ SOLN
12.5000 mg | Freq: Four times a day (QID) | INTRAMUSCULAR | Status: DC | PRN
  Administered 2016-05-16: 12.5 mg via INTRAVENOUS
  Filled 2016-05-16 (×2): qty 1

## 2016-05-16 MED ORDER — MECLIZINE HCL 12.5 MG PO TABS: 12.5000 mg | ORAL_TABLET | Freq: Three times a day (TID) | ORAL | 0 refills | Status: DC | PRN

## 2016-05-16 NOTE — Discharge Summary (Signed)
Physician Discharge Summary  [name] [id number] DOB: October 04, 1932 DOA: 05/15/2016  PCP: Aretta Nip, MD  Admit date: 05/15/2016 Discharge date: 05/16/2016  Admitted From: home  Disposition:  home   Recommendations for Outpatient Follow-up:  1. Repeat TSH   Discharge Condition:  stable   CODE STATUS:  DNR   Diet recommendation:  Heart healthy, diabetic Consultations:  none    Discharge Diagnoses:  Principal Problem:   Chest pain Active Problems:   HTN (hypertension)   Diabetes mellitus (HCC)   Dyslipidemia   Essential tremor   Vertigo    Subjective: Mild nausea this AM. Vertigo not severe. No chest pain. Wants to go home today.   Brief Summary: [name] is a 82 y.o. female with medical history significant of DM, HTN, HLD, and multiple other medical problems.  Patient awoke Wednesday AM and her balance was off.  Has fallen a few times in the past requiring ER visits (last in 4/17, hit bathroom door with her head and fractured left wrist).  No spinning, just "fuzzy like."  Also nauseated that AM.  Comes and goes, better in afternoons.  Also with a couple of episodes of substernal chest pain, lasted less than a minute.  Intermittent, can't tell when it will happen.  Also sometimes in left axillary region.  Was given Meclizine in ER after negative MRI.  Has essential tremor, which leads to balance issues.  Remote history of stress test/cath - 15+ years ago (Dr. Einar Gip).  Left eye was also blurry on Tuesday prior to onset of the other symptoms.  Has right leg nerve pain (peripheral neuropathy).   Hospital Course:  Chest pain -This was a minor problem that seemed to be mentioned as a side note -Non exertional, no associated symptoms -She does have CVD RF and only remote h/o stress test/cath - monitored overnight -  Serial cardiac enzymes negative-  and repeat EKG today unremarkable -Continue statin which she is already on at home -Risk factor stratification with  FLP and HgbA1c; will also check TSH  Vertigo -This was primary presenting complaint -MRI was negative -Given Meclizine in ER -Can continue Meclizine    Elevated TSH - would ask PCP repeat this as outpt in next 1-2 wks  HTN -Continue Chlorthalidone, Catapres, Cozaar  DM -Hold oral agents -Continue Humalog and cover with SSI  HLD -Continue Pravachol  Essential tremor -Continue Inderal   Discharge Instructions     Medication List    TAKE these medications   acetaminophen 500 MG tablet Commonly known as:  TYLENOL Take 1,000 mg by mouth every 6 (six) hours as needed for moderate pain.   B-D ULTRAFINE III SHORT PEN 31G X 8 MM Misc Generic drug:  Insulin Pen Needle USE 1 PEN NEEDLE SUBCUTANEOUSLY EVERY DAY   chlorthalidone 25 MG tablet Commonly known as:  HYGROTON Take 25 mg by mouth daily.   cloNIDine 0.1 MG tablet Commonly known as:  CATAPRES Take 0.1 mg by mouth 2 (two) times daily.   gabapentin 400 MG capsule Commonly known as:  NEURONTIN Take 400 mg by mouth 2 (two) times daily.   HUMALOG MIX 75/25 KWIKPEN (75-25) 100 UNIT/ML Kwikpen Generic drug:  Insulin Lispro Prot & Lispro Inject 25-50 Units into the skin 2 (two) times daily. Inject 50 units in the am and Inject 25 in the pm.   losartan 100 MG tablet Commonly known as:  COZAAR Take 100 mg by mouth daily.   meclizine 12.5 MG tablet Commonly  known as:  ANTIVERT Take 1 tablet (12.5 mg total) by mouth 3 (three) times daily as needed for dizziness.   metFORMIN 1000 MG tablet Commonly known as:  GLUCOPHAGE Take 1,000 mg by mouth 2 (two) times daily with a meal.   pravastatin 40 MG tablet Commonly known as:  PRAVACHOL Take 40 mg by mouth at bedtime.   promethazine 25 MG tablet Commonly known as:  PHENERGAN Take 1 tablet (25 mg total) by mouth every 6 (six) hours as needed for nausea or vomiting.   propranolol 20 MG tablet Commonly known as:  INDERAL Take 1 tablet (20 mg total) by mouth 2  (two) times daily.   TRADJENTA 5 MG Tabs tablet Generic drug:  linagliptin Take 5 mg by mouth daily.   traMADol 50 MG tablet Commonly known as:  ULTRAM Take 1 tablet (50 mg total) by mouth every 6 (six) hours as needed.       Allergies  Allergen Reactions  . Ciprofloxacin Other (See Comments)    Acute kidney failure  . Codeine Nausea And Vomiting  . Dilaudid [Hydromorphone Hcl] Nausea And Vomiting and Other (See Comments)    Severe sweating  . Mysoline [Primidone] Nausea Only  . Oxycodone Nausea And Vomiting     Procedures/Studies:   Dg Chest 2 View  Result Date: 05/15/2016 CLINICAL DATA:  Worsening shooting central chest pain. Altered mental status without headache. Intermittent nausea and unsteady gait. History of hypertension. EXAM: CHEST  2 VIEW COMPARISON:  Chest radiograph March 18, 2014 FINDINGS: Cardiomediastinal silhouette is normal. No pleural effusions or focal consolidations. Trachea projects midline and there is no pneumothorax. Soft tissue planes and included osseous structures are non-suspicious. ACDF. Mild degenerative change of thoracic spine. IMPRESSION: No acute cardiopulmonary process. Electronically Signed   By: Elon Alas M.D.   On: 05/15/2016 15:44   Mr Brain Wo Contrast  Result Date: 05/15/2016 CLINICAL DATA:  Gait instability and hypertension for 2 days. History of essential tremor, hypertension, diabetes, dyslipidemia. Fall in April with head injury. EXAM: MRI HEAD WITHOUT CONTRAST TECHNIQUE: Multiplanar, multiecho pulse sequences of the brain and surrounding structures were obtained without intravenous contrast. COMPARISON:  CT HEAD December 21, 2015 and MRI of the head April 18, 2006 FINDINGS: BRAIN: No reduced diffusion to suggest acute ischemia. No susceptibility artifact to suggest hemorrhage. The ventricles and sulci are normal for patient's age. A few scattered subcentimeter supratentorial white matter FLAIR T2 hyperintensities are less than  expected for age. No suspicious parenchymal signal, masses or mass effect. No abnormal extra-axial fluid collections. No extra-axial masses though, contrast enhanced sequences would be more sensitive. VASCULAR: Normal major intracranial vascular flow voids present at skull base. SKULL AND UPPER CERVICAL SPINE: No abnormal sellar expansion. No suspicious calvarial bone marrow signal. Craniocervical junction maintained. Partially imaged ACDF. SINUSES/ORBITS: The included ocular globes and orbital contents are non-suspicious. Status post bilateral ocular lens implants. The mastoid air-cells and included paranasal sinuses are well-aerated. OTHER: None. IMPRESSION: Negative MRI head for age. Electronically Signed   By: Elon Alas M.D.   On: 05/15/2016 20:10       Discharge Exam: Vitals:   05/16/16 0548 05/16/16 1015  BP: 119/60 (!) 153/62  Pulse: 67 65  Resp: 18 20  Temp: 99.1 F (37.3 C) 98.3 F (36.8 C)   Vitals:   05/15/16 2342 05/16/16 0246 05/16/16 0548 05/16/16 1015  BP: (!) 144/65 129/66 119/60 (!) 153/62  Pulse:  68 67 65  Resp:  18 18 20  Temp:  98.1 F (36.7 C) 99.1 F (37.3 C) 98.3 F (36.8 C)  TempSrc:  Oral Oral Oral  SpO2:  97% 95% 98%  Weight:      Height:        General: Pt is alert, awake, not in acute distress Cardiovascular: RRR, S1/S2 +, no rubs, no gallops Respiratory: CTA bilaterally, no wheezing, no rhonchi Abdominal: Soft, NT, ND, bowel sounds + Extremities: no edema, no cyanosis    The results of significant diagnostics from this hospitalization (including imaging, microbiology, ancillary and laboratory) are listed below for reference.     Microbiology: No results found for this or any previous visit (from the past 240 hour(s)).   Labs: BNP (last 3 results) No results for input(s): BNP in the last 8760 hours. Basic Metabolic Panel:  Recent Labs Lab 05/15/16 1516  NA 140  K 3.7  CL 107  CO2 24  GLUCOSE 193*  BUN 24*  CREATININE 0.94   CALCIUM 9.1   Liver Function Tests: No results for input(s): AST, ALT, ALKPHOS, BILITOT, PROT, ALBUMIN in the last 168 hours. No results for input(s): LIPASE, AMYLASE in the last 168 hours. No results for input(s): AMMONIA in the last 168 hours. CBC:  Recent Labs Lab 05/15/16 1516  WBC 6.6  HGB 11.4*  HCT 34.4*  MCV 83.5  PLT 202   Cardiac Enzymes:  Recent Labs Lab 05/15/16 2325 05/16/16 0508 05/16/16 0937  TROPONINI <0.03 <0.03 <0.03   BNP: Invalid input(s): POCBNP CBG:  Recent Labs Lab 05/15/16 2239 05/16/16 0752 05/16/16 1124  GLUCAP 163* 135* 127*   D-Dimer No results for input(s): DDIMER in the last 72 hours. Hgb A1c No results for input(s): HGBA1C in the last 72 hours. Lipid Profile  Recent Labs  05/16/16 0508  CHOL 152  HDL 38*  LDLCALC 57  TRIG 283*  CHOLHDL 4.0   Thyroid function studies  Recent Labs  05/16/16 0508  TSH 5.394*   Anemia work up No results for input(s): VITAMINB12, FOLATE, FERRITIN, TIBC, IRON, RETICCTPCT in the last 72 hours. Urinalysis    Component Value Date/Time   COLORURINE YELLOW 05/15/2016 1702   APPEARANCEUR CLEAR 05/15/2016 1702   LABSPEC 1.016 05/15/2016 1702   PHURINE 5.5 05/15/2016 1702   GLUCOSEU 100 (A) 05/15/2016 1702   HGBUR NEGATIVE 05/15/2016 1702   BILIRUBINUR NEGATIVE 05/15/2016 1702   KETONESUR NEGATIVE 05/15/2016 1702   PROTEINUR NEGATIVE 05/15/2016 1702   UROBILINOGEN 0.2 05/03/2014 0942   NITRITE NEGATIVE 05/15/2016 1702   LEUKOCYTESUR NEGATIVE 05/15/2016 1702   Sepsis Labs Invalid input(s): PROCALCITONIN,  WBC,  LACTICIDVEN Microbiology No results found for this or any previous visit (from the past 240 hour(s)).   Time coordinating discharge: Over 30 minutes  SIGNED:   Debbe Odea, MD  [facility] 05/16/2016, 1:16 PM Pager   If 7PM-7AM, please contact night-coverage [email or web address] Password [email or web address]

## 2016-05-17 LAB — HEMOGLOBIN A1C
Hgb A1c MFr Bld: 7.8 % — ABNORMAL HIGH (ref 4.8–5.6)
Mean Plasma Glucose: 177 mg/dL

## 2016-07-07 ENCOUNTER — Telehealth: Payer: Self-pay | Admitting: Neurology

## 2016-07-07 NOTE — Telephone Encounter (Signed)
[facility]ak[facility]/ [facility]  Called to check on a form that ws faxed in . It is a request  For clarification of pts chart.  Phone: [phone] ext [phone]

## 2016-07-08 NOTE — Telephone Encounter (Signed)
Returned TC to [employer] @ [facility]. Asked that she re-fax requested form, not received previously.

## 2016-09-30 ENCOUNTER — Emergency Department (HOSPITAL_COMMUNITY)
Admission: EM | Admit: 2016-09-30 | Discharge: 2016-10-01 | Disposition: A | Discharge status: Home / Self Care | Payer: Medicare Other | Attending: Emergency Medicine | Admitting: Emergency Medicine

## 2016-09-30 ENCOUNTER — Emergency Department (HOSPITAL_COMMUNITY): Payer: Medicare Other

## 2016-09-30 ENCOUNTER — Encounter (HOSPITAL_COMMUNITY): Payer: Self-pay | Admitting: Emergency Medicine

## 2016-09-30 DIAGNOSIS — S4992XA Unspecified injury of left shoulder and upper arm, initial encounter: Secondary | ICD-10-CM | POA: Diagnosis present

## 2016-09-30 DIAGNOSIS — Y999 Unspecified external cause status: Secondary | ICD-10-CM | POA: Insufficient documentation

## 2016-09-30 DIAGNOSIS — Z96651 Presence of right artificial knee joint: Secondary | ICD-10-CM | POA: Diagnosis not present

## 2016-09-30 DIAGNOSIS — Z7984 Long term (current) use of oral hypoglycemic drugs: Secondary | ICD-10-CM | POA: Diagnosis not present

## 2016-09-30 DIAGNOSIS — Y939 Activity, unspecified: Secondary | ICD-10-CM | POA: Insufficient documentation

## 2016-09-30 DIAGNOSIS — Z85828 Personal history of other malignant neoplasm of skin: Secondary | ICD-10-CM | POA: Insufficient documentation

## 2016-09-30 DIAGNOSIS — E1140 Type 2 diabetes mellitus with diabetic neuropathy, unspecified: Secondary | ICD-10-CM | POA: Insufficient documentation

## 2016-09-30 DIAGNOSIS — Y929 Unspecified place or not applicable: Secondary | ICD-10-CM | POA: Diagnosis not present

## 2016-09-30 DIAGNOSIS — X58XXXA Exposure to other specified factors, initial encounter: Secondary | ICD-10-CM | POA: Insufficient documentation

## 2016-09-30 DIAGNOSIS — I10 Essential (primary) hypertension: Secondary | ICD-10-CM | POA: Diagnosis not present

## 2016-09-30 DIAGNOSIS — S46912A Strain of unspecified muscle, fascia and tendon at shoulder and upper arm level, left arm, initial encounter: Secondary | ICD-10-CM

## 2016-09-30 LAB — BASIC METABOLIC PANEL
Anion gap: 9 (ref 5–15)
BUN: 39 mg/dL — ABNORMAL HIGH (ref 6–20)
CO2: 24 mmol/L (ref 22–32)
Calcium: 8.8 mg/dL — ABNORMAL LOW (ref 8.9–10.3)
Chloride: 105 mmol/L (ref 101–111)
Creatinine, Ser: 1.5 mg/dL — ABNORMAL HIGH (ref 0.44–1.00)
GFR calc Af Amer: 36 mL/min — ABNORMAL LOW (ref >60)
GFR calc non Af Amer: 31 mL/min — ABNORMAL LOW (ref >60)
Glucose, Bld: 312 mg/dL — ABNORMAL HIGH (ref 65–99)
Potassium: 4.3 mmol/L (ref 3.5–5.1)
Sodium: 138 mmol/L (ref 135–145)

## 2016-09-30 LAB — CBC
HCT: 35.3 % — ABNORMAL LOW (ref 36.0–46.0)
Hemoglobin: 11.9 g/dL — ABNORMAL LOW (ref 12.0–15.0)
MCH: 27.5 pg (ref 26.0–34.0)
MCHC: 33.7 g/dL (ref 30.0–36.0)
MCV: 81.5 fL (ref 78.0–100.0)
Platelets: 176 10*3/uL (ref 150–400)
RBC: 4.33 MIL/uL (ref 3.87–5.11)
RDW: 13.7 % (ref 11.5–15.5)
WBC: 5.8 10*3/uL (ref 4.0–10.5)

## 2016-09-30 LAB — I-STAT TROPONIN, ED
Troponin i, poc: 0 ng/mL (ref 0.00–0.08)
Troponin i, poc: 0.01 ng/mL (ref 0.00–0.08)

## 2016-09-30 MED ORDER — CLONIDINE HCL 0.1 MG PO TABS
0.1000 mg | ORAL_TABLET | Freq: Once | ORAL | Status: DC
  Filled 2016-09-30: qty 1

## 2016-09-30 MED ORDER — PROPRANOLOL HCL 20 MG PO TABS
20.0000 mg | ORAL_TABLET | Freq: Once | ORAL | Status: DC
  Filled 2016-09-30: qty 1

## 2016-09-30 MED ORDER — LOSARTAN POTASSIUM 50 MG PO TABS
100.0000 mg | ORAL_TABLET | Freq: Once | ORAL | Status: DC
  Filled 2016-09-30: qty 2

## 2016-09-30 MED ORDER — CHLORTHALIDONE 25 MG PO TABS: 25.0000 mg | ORAL_TABLET | Freq: Every day | ORAL | Status: DC

## 2016-09-30 NOTE — ED Triage Notes (Signed)
Patient is complaining of intermittent lower/mid back pain x1-2 weeks  and left shoulder/ left chest pain pain starting today. Reports nausea x2 days ago.

## 2016-10-01 MED ORDER — DICLOFENAC SODIUM 1 % TD GEL: 4.0000 g | Freq: Four times a day (QID) | TRANSDERMAL | 0 refills | Status: DC

## 2016-10-01 MED ORDER — BUPIVACAINE HCL (PF) 0.5 % IJ SOLN
10.0000 mL | Freq: Once | INTRAMUSCULAR | Status: AC
  Administered 2016-10-01: 10 mL
  Filled 2016-10-01: qty 30

## 2016-10-01 NOTE — ED Notes (Signed)
Pt does not want a iv

## 2016-10-01 NOTE — ED Provider Notes (Signed)
[facility] DEPT Provider Note   CSN: [id number] Arrival date & time: 09/30/16  1605     History   Chief Complaint Chief Complaint  Patient presents with  . Chest Pain  . Arm Pain    Left    HPI [name] is a 83 y.o. female.  The history is provided by the patient.  Shoulder Pain   This is a new problem. The current episode started more than 1 week ago. The problem occurs constantly. The problem has been gradually worsening. The pain is present in the left shoulder and left arm. The pain is moderate. Associated symptoms include stiffness. Pertinent negatives include no numbness and full range of motion. She has tried nothing for the symptoms. There has been no history of extremity trauma.    Past Medical History:  Diagnosis Date  . Anemia    as a teenager  . Arthritis   . Cancer ([place])    skin  . Cholelithiasis   . Chronic insomnia   . Chronic low back pain   . Complication of anesthesia   . DDD (degenerative disc disease), lumbar   . Diabetes mellitus    Iddm x 12 years  . Diabetic peripheral neuropathy ([place])   . Essential tremor 04/01/2015  . GERD (gastroesophageal reflux disease)   . Hx of acute renal failure 02/2014   admitted to [facility]  . Hx: UTI (urinary tract infection) 02/2014  . Hyperlipemia   . Hypertension   . Obesity   . Peripheral edema   . Pneumonia    as a child  . PONV (postoperative nausea and vomiting)    pt states Zofran not that helpful in the past, Phenergan works best for her  . Staph infection    1970's  . Umbilical hernia   . Wrist fracture    left October 2014    Patient Active Problem List   Diagnosis Date Noted  . Vertigo 05/16/2016  . Chest pain 05/15/2016  . Essential tremor 04/01/2015  . Chronic low back pain 04/01/2015  . Lumbar herniated disc 02/06/2015  . Lumbar radiculopathy 03/20/2014  . UTI (lower urinary tract infection) 03/18/2014  . Hyperglycemia 03/18/2014  . Dyslipidemia 03/18/2014  . Lumbar  stenosis 03/16/2014  . HTN (hypertension) 02/12/2014  . Diabetes mellitus ([place]) 02/12/2014  . Acute renal failure ([place]) 02/11/2014  . H/O total knee replacement 08/11/2013  . Gallstones 10/01/2011    Past Surgical History:  Procedure Laterality Date  . ABDOMINAL HYSTERECTOMY    . BACK SURGERY  2005  . CARDIAC CATHETERIZATION  2000   Dr Einar Gip  . CATARACT EXTRACTION Bilateral    w/ lens implants  . COLONOSCOPY  >10 years  . EYE SURGERY    . HERNIA REPAIR    . LUMBAR LAMINECTOMY/DECOMPRESSION MICRODISCECTOMY Left 03/16/2014   Procedure: LUMBAR LAMINECTOMY/DECOMPRESSION MICRODISCECTOMY 1 LEVEL  lumbar level three/four;  Surgeon: [name], MD;  Location: [facility];  Service: Neurosurgery;  Laterality: Left;  . LUMBAR LAMINECTOMY/DECOMPRESSION MICRODISCECTOMY Right 02/06/2015   Procedure: Right L4-5 Microdiskectomy;  Surgeon: Leeroy Cha, MD;  Location: [facility];  Service: Neurosurgery;  Laterality: Right;  Right L4-5 Microdiskectomy  . NECK SURGERY    . OOPHORECTOMY    . TOTAL KNEE ARTHROPLASTY Right     OB History    No data available       Home Medications    Prior to Admission medications   Medication Sig Start Date End Date Taking? Authorizing Provider  acetaminophen (TYLENOL) 500 MG tablet Take 1,000 mg by mouth every 6 (six) hours as needed for moderate pain.   Yes Historical Provider, MD  chlorthalidone (HYGROTON) 25 MG tablet Take 25 mg by mouth daily.  01/23/14  Yes Historical Provider, MD  cloNIDine (CATAPRES) 0.1 MG tablet Take 0.1 mg by mouth 2 (two) times daily.     Yes Historical Provider, MD  gabapentin (NEURONTIN) 400 MG capsule Take 400-800 mg by mouth 2 (two) times daily.    Yes Historical Provider, MD  HUMALOG MIX 75/25 KWIKPEN (75-25) 100 UNIT/ML Kwikpen Inject 25-50 Units into the skin 2 (two) times daily. Inject 50 units in the am and Inject 25 in the pm. 06/04/15  Yes Historical Provider, MD  lidocaine (LIDODERM) 5 % Place 1 patch onto the skin daily  as needed (pain). Remove & Discard patch within 12 hours or as directed by MD   Yes Historical Provider, MD  losartan (COZAAR) 100 MG tablet Take 100 mg by mouth daily.   Yes Historical Provider, MD  metFORMIN (GLUCOPHAGE) 1000 MG tablet Take 1,000 mg by mouth 2 (two) times daily with a meal.    Yes Historical Provider, MD  pravastatin (PRAVACHOL) 40 MG tablet Take 40 mg by mouth at bedtime.  01/15/14  Yes Historical Provider, MD  promethazine (PHENERGAN) 25 MG tablet Take 1 tablet (25 mg total) by mouth every 6 (six) hours as needed for nausea or vomiting. 05/03/14  Yes Charlesetta Shanks, MD  propranolol (INDERAL) 20 MG tablet Take 1 tablet (20 mg total) by mouth 2 (two) times daily. 04/13/16  Yes Ward Givens, NP  TRADJENTA 5 MG TABS tablet Take 5 mg by mouth daily. 07/24/13  Yes Historical Provider, MD  traMADol (ULTRAM) 50 MG tablet Take 1 tablet (50 mg total) by mouth every 6 (six) hours as needed. Patient taking differently: Take 50 mg by mouth every 6 (six) hours as needed for moderate pain.  06/06/15  Yes Virgel Manifold, MD  meclizine (ANTIVERT) 12.5 MG tablet Take 1 tablet (12.5 mg total) by mouth 3 (three) times daily as needed for dizziness. Patient not taking: Reported on 09/30/2016 05/16/16   Debbe Odea, MD    Family History Family History  Problem Relation Age of Onset  . Cancer Brother     prostate  . Heart disease Brother   . Alzheimer's disease Brother   . Stroke Mother   . Hypertension Mother   . COPD Brother   . Cancer Brother     Prostate cancer  . Hypertension Sister     Congestive heart failure  . Heart attack Son     Social History Social History  Substance Use Topics  . Smoking status: Never Smoker  . Smokeless tobacco: Never Used  . Alcohol use No     Allergies   Ciprofloxacin; Codeine; Dilaudid [hydromorphone hcl]; Mysoline [primidone]; and Oxycodone   Review of Systems Review of Systems  Musculoskeletal: Positive for stiffness.  Neurological: Negative  for numbness.  All other systems reviewed and are negative.    Physical Exam Updated Vital Signs BP 157/67 (BP Location: Right Arm)   Pulse 69   Temp 98.6 F (37 C) (Oral)   Resp 20   Ht 5\' 9"  (1.753 m)   Wt 240 lb (108.9 kg)   SpO2 97%   BMI 35.44 kg/m   Physical Exam  Constitutional: She is oriented to person, place, and time. She appears well-developed and well-nourished. No distress.  HENT:  Head: Normocephalic.  Nose: Nose normal.  Eyes: Conjunctivae are normal.  Neck: Neck supple. No tracheal deviation present.  Cardiovascular: Normal rate, regular rhythm and normal heart sounds.   Pulmonary/Chest: Effort normal and breath sounds normal. No respiratory distress. She exhibits no tenderness.  Abdominal: Soft. She exhibits no distension.  Musculoskeletal:       Left shoulder: She exhibits tenderness (posterior shoulder) and pain. She exhibits normal range of motion, no swelling, no deformity and normal strength.  Neurological: She is alert and oriented to person, place, and time.  Skin: Skin is warm and dry.  Psychiatric: She has a normal mood and affect.  Vitals reviewed.    ED Treatments / Results  Labs (all labs ordered are listed, but only abnormal results are displayed) Labs Reviewed  BASIC METABOLIC PANEL - Abnormal; Notable for the following:       Result Value   Glucose, Bld 312 (*)    BUN 39 (*)    Creatinine, Ser 1.50 (*)    Calcium 8.8 (*)    GFR calc non Af Amer 31 (*)    GFR calc Af Amer 36 (*)    All other components within normal limits  CBC - Abnormal; Notable for the following:    Hemoglobin 11.9 (*)    HCT 35.3 (*)    All other components within normal limits  I-STAT TROPOININ, ED  I-STAT TROPOININ, ED    EKG  EKG Interpretation None       Radiology Dg Chest 2 View  Result Date: 09/30/2016 CLINICAL DATA:  Pt states she has been having left sided chest pain today. Pt also states she has had mid to lower back pain for 1-2 weeks.  Pt c/o SOB and nausea today. PT Hx: DM and HTN EXAM: CHEST  2 VIEW COMPARISON:  05/15/2016 FINDINGS: The heart size and mediastinal contours are within normal limits. Both lungs are clear. The visualized skeletal structures are unremarkable. IMPRESSION: No active cardiopulmonary disease. Electronically Signed   By: Kathreen Devoid   On: 09/30/2016 16:35    Procedures Procedures (including critical care time)  Procedure Note: Trigger Point Injection for Myofascial pain  Performed by Dr. Laneta Simmers Indication: muscle/myofascial pain Muscle body and tendon sheath of the left posterior shoulder muscle(s) were injected with 0.5% bupivacaine under sterile technique for release of muscle spasm/pain. Patient tolerated well with immediate improvement of symptoms and no immediate complications following procedure.  CPT Code:   1 or 2 muscle bodies: 20552    Medications Ordered in ED Medications  bupivacaine (MARCAINE) 0.5 % injection 10 mL (10 mLs Infiltration Given 10/01/16 0045)     Initial Impression / Assessment and Plan / ED Course  I have reviewed the triage vital signs and the nursing notes.  Pertinent labs & imaging results that were available during my care of the patient were reviewed by me and considered in my medical decision making (see chart for details).     83 y.o. female presents with left shoulder pain starting posteriorly that has since moved anteriorly. No frank chest pain, troponins negatgive x2 without EKG changes, point tender over posterior shoulder and local anaesthesia applied with improvement. Suspect MSK etiology. D/t advanced age and borderline renal function will treat with topical NSAIDs and recommended early mobility. Plan to follow up with PCP as needed and return precautions discussed for worsening or new concerning symptoms.    Final Clinical Impressions(s) / ED Diagnoses   Final diagnoses:  Muscle strain of left shoulder, initial encounter  New  Prescriptions New Prescriptions   No medications on file     Leo Grosser, MD 10/01/16 [phone]

## 2016-10-15 ENCOUNTER — Ambulatory Visit: Payer: Medicare Other | Admitting: Neurology

## 2016-10-29 ENCOUNTER — Other Ambulatory Visit: Payer: Self-pay | Admitting: Neurosurgery

## 2016-10-29 DIAGNOSIS — M5440 Lumbago with sciatica, unspecified side: Secondary | ICD-10-CM

## 2016-11-08 ENCOUNTER — Ambulatory Visit
Admission: RE | Admit: 2016-11-08 | Discharge: 2016-11-08 | Disposition: A | Discharge status: Home / Self Care | Payer: Medicare Other | Source: Ambulatory Visit | Attending: Neurosurgery | Admitting: Neurosurgery

## 2016-11-08 DIAGNOSIS — M5440 Lumbago with sciatica, unspecified side: Secondary | ICD-10-CM

## 2016-11-08 MED ORDER — GADOBENATE DIMEGLUMINE 529 MG/ML IV SOLN
10.0000 mL | Freq: Once | INTRAVENOUS | Status: AC | PRN
  Administered 2016-11-08: 10 mL via INTRAVENOUS

## 2016-12-01 ENCOUNTER — Encounter: Payer: Self-pay | Admitting: Neurology

## 2016-12-01 ENCOUNTER — Ambulatory Visit (INDEPENDENT_AMBULATORY_CARE_PROVIDER_SITE_OTHER): Payer: Medicare Other | Admitting: Neurology

## 2016-12-01 ENCOUNTER — Encounter (INDEPENDENT_AMBULATORY_CARE_PROVIDER_SITE_OTHER): Payer: Self-pay

## 2016-12-01 VITALS — BP 123/67 | HR 74 | Ht 69.0 in | Wt 237.5 lb

## 2016-12-01 DIAGNOSIS — M545 Low back pain: Secondary | ICD-10-CM

## 2016-12-01 DIAGNOSIS — G250 Essential tremor: Secondary | ICD-10-CM | POA: Diagnosis not present

## 2016-12-01 DIAGNOSIS — G8929 Other chronic pain: Secondary | ICD-10-CM | POA: Diagnosis not present

## 2016-12-01 MED ORDER — DULOXETINE HCL 30 MG PO CPEP: 30.0000 mg | ORAL_CAPSULE | Freq: Two times a day (BID) | ORAL | 3 refills | Status: DC

## 2016-12-01 MED ORDER — PROPRANOLOL HCL 20 MG PO TABS: 40.0000 mg | ORAL_TABLET | Freq: Two times a day (BID) | ORAL | 3 refills | Status: DC

## 2016-12-01 NOTE — Patient Instructions (Signed)
   With the propranolol, we will go up to 40 mg (2 of the 20 mg) twice a day.  We will start Cymbalta for the low back pain, 30 mg, start one a day for 2 weeks, then go to one twice a day.  Cymbalta (duloxetine) is an antidepressant medication that is commonly used for peripheral neuropathy pain or for fibromyalgia pain. As with any antidepressant medication, worsening depression can be seen. This medication can potentially cause headache, dizziness, sexual dysfunction, or nausea. If any problems are noted on this medication, please contact our office.

## 2016-12-01 NOTE — Progress Notes (Signed)
Reason for visit: Essential tremor  [name] is an 83 y.o. female  History of present illness:  [name] is an 83 year old right-handed white female with a history of an essential tremor associated with tremor affecting both arms, and a mild vocal tremor. The patient has been on propranolol, she is tolerating this well, she believes that over time her tremor has slightly worsened. She has some difficulty with cooking or holding a glass of water or liquid. The patient does have a mild gait disorder, she feels as if she swerves back and forth when she tries to walk, she denies any recent falls. The last fall was in April 2017. The patient has also had some problems with chronic low back pain, she has seen Dr. Sherwood Gambler for this, she has undergone a recent MRI of the low back that shows evidence of right L4 impingement. The patient reports sciatica down the right leg to the foot. She was told that she was not a good surgical candidate secondary to scar tissue in the back. The patient returns to the office today for an evaluation. The patient takes gabapentin for the low back pain without complete benefit.  Past Medical History:  Diagnosis Date  . Anemia    as a teenager  . Arthritis   . Cancer ([place])    skin  . Cholelithiasis   . Chronic insomnia   . Chronic low back pain   . Complication of anesthesia   . DDD (degenerative disc disease), lumbar   . Diabetes mellitus    Iddm x 12 years  . Diabetic peripheral neuropathy ([place])   . Essential tremor 04/01/2015  . GERD (gastroesophageal reflux disease)   . Hx of acute renal failure 02/2014   admitted to [facility]  . Hx: UTI (urinary tract infection) 02/2014  . Hyperlipemia   . Hypertension   . Obesity   . Peripheral edema   . Pneumonia    as a child  . PONV (postoperative nausea and vomiting)    pt states Zofran not that helpful in the past, Phenergan works best for her  . Staph infection    1970's  . Umbilical hernia   .  Wrist fracture    left October 2014    Past Surgical History:  Procedure Laterality Date  . ABDOMINAL HYSTERECTOMY    . BACK SURGERY  2005  . CARDIAC CATHETERIZATION  2000   Dr Einar Gip  . CATARACT EXTRACTION Bilateral    w/ lens implants  . COLONOSCOPY  >10 years  . EYE SURGERY    . HERNIA REPAIR    . LUMBAR LAMINECTOMY/DECOMPRESSION MICRODISCECTOMY Left 03/16/2014   Procedure: LUMBAR LAMINECTOMY/DECOMPRESSION MICRODISCECTOMY 1 LEVEL  lumbar level three/four;  Surgeon: [name], MD;  Location: [facility];  Service: Neurosurgery;  Laterality: Left;  . LUMBAR LAMINECTOMY/DECOMPRESSION MICRODISCECTOMY Right 02/06/2015   Procedure: Right L4-5 Microdiskectomy;  Surgeon: Leeroy Cha, MD;  Location: [facility];  Service: Neurosurgery;  Laterality: Right;  Right L4-5 Microdiskectomy  . NECK SURGERY    . OOPHORECTOMY    . TOTAL KNEE ARTHROPLASTY Right     Family History  Problem Relation Age of Onset  . Cancer Brother     prostate  . Heart disease Brother   . Alzheimer's disease Brother   . Stroke Mother   . Hypertension Mother   . COPD Brother   . Cancer Brother     Prostate cancer  . Hypertension Sister  Congestive heart failure  . Heart attack Son     Social history:  reports that she has never smoked. She has never used smokeless tobacco. She reports that she does not drink alcohol or use drugs.    Allergies  Allergen Reactions  . Ciprofloxacin Other (See Comments)    Acute kidney failure  . Codeine Nausea And Vomiting  . Dilaudid [Hydromorphone Hcl] Nausea And Vomiting and Other (See Comments)    Severe sweating  . Mysoline [Primidone] Nausea Only  . Oxycodone Nausea And Vomiting    Medications:  Prior to Admission medications   Medication Sig Start Date End Date Taking? Authorizing Provider  acetaminophen (TYLENOL) 500 MG tablet Take 1,000 mg by mouth every 6 (six) hours as needed for moderate pain.   Yes Historical Provider, MD  chlorthalidone (HYGROTON)  25 MG tablet Take 25 mg by mouth daily.  01/23/14  Yes Historical Provider, MD  cloNIDine (CATAPRES) 0.1 MG tablet Take 0.1 mg by mouth 2 (two) times daily.     Yes Historical Provider, MD  diclofenac sodium (VOLTAREN) 1 % GEL Apply 4 g topically 4 (four) times daily. 10/01/16  Yes Leo Grosser, MD  gabapentin (NEURONTIN) 400 MG capsule Take 400-800 mg by mouth 2 (two) times daily.    Yes Historical Provider, MD  HUMALOG MIX 75/25 KWIKPEN (75-25) 100 UNIT/ML Kwikpen Inject 25-50 Units into the skin 2 (two) times daily. Inject 50 units in the am and Inject 25 in the pm. 06/04/15  Yes Historical Provider, MD  lidocaine (LIDODERM) 5 % Place 1 patch onto the skin daily as needed (pain). Remove & Discard patch within 12 hours or as directed by MD   Yes Historical Provider, MD  losartan (COZAAR) 100 MG tablet Take 100 mg by mouth daily.   Yes Historical Provider, MD  metFORMIN (GLUCOPHAGE) 1000 MG tablet Take 1,000 mg by mouth 2 (two) times daily with a meal.    Yes Historical Provider, MD  pravastatin (PRAVACHOL) 40 MG tablet Take 40 mg by mouth at bedtime.  01/15/14  Yes Historical Provider, MD  promethazine (PHENERGAN) 25 MG tablet Take 1 tablet (25 mg total) by mouth every 6 (six) hours as needed for nausea or vomiting. 05/03/14  Yes Charlesetta Shanks, MD  propranolol (INDERAL) 20 MG tablet Take 1 tablet (20 mg total) by mouth 2 (two) times daily. 04/13/16  Yes Ward Givens, NP  TRADJENTA 5 MG TABS tablet Take 5 mg by mouth daily. 07/24/13  Yes Historical Provider, MD  traMADol (ULTRAM) 50 MG tablet Take 1 tablet (50 mg total) by mouth every 6 (six) hours as needed. Patient taking differently: Take 50 mg by mouth every 6 (six) hours as needed for moderate pain.  06/06/15  Yes Virgel Manifold, MD    ROS:  Out of a complete 14 system review of symptoms, the patient complains only of the following symptoms, and all other reviewed systems are negative.  Runny nose Daytime sleepiness Tremors Back pain  Blood  pressure 123/67, pulse 74, height 5\' 9"  (1.753 m), weight 237 lb 8 oz (107.7 kg).  Physical Exam  General: The patient is alert and cooperative at the time of the examination. The patient is moderately obese.  Skin: No significant peripheral edema is noted.   Neurologic Exam  Mental status: The patient is alert and oriented x 3 at the time of the examination. The patient has apparent normal recent and remote memory, with an apparently normal attention span and concentration ability.  Cranial nerves: Facial symmetry is present. Speech is normal, no aphasia or dysarthria is noted, with exception of a mild vocal tremor. Extraocular movements are full. Visual fields are full.  Motor: The patient has good strength in all 4 extremities.  Sensory examination: Soft touch sensation is symmetric on the face, arms, and legs.  Coordination: The patient has good finger-nose-finger and heel-to-shin bilaterally. The patient has a mild intention tremor bilaterally. The patient is able to draw a spiral, minimal tremor seen in the handwriting.  Gait and station: The patient has a minimally wide-based, slightly unsteady gait. The patient is able to walk independently. Tandem gait is unsteady. Romberg is negative. No drift is seen.  Reflexes: Deep tendon reflexes are symmetric.   Assessment/Plan:  1. Essential tremor  2. Chronic low back pain, right sided sciatica  The patient was given a prescription for propranolol today, we will go up on the dosing to 40 mg twice daily. The patient will be placed on Cymbalta for the low back pain starting on a 30 mg tablet daily for 2 weeks then go to 30 mg twice daily. She will contact our office for any dose adjustments of these medications. She will follow-up in 6 months.  Jill Alexanders MD 12/01/2016 12:39 PM  [facility] [address] [place], [place] [address]  Phone [phone] Fax [phone]

## 2016-12-15 ENCOUNTER — Telehealth: Payer: Self-pay | Admitting: Neurology

## 2016-12-15 NOTE — Telephone Encounter (Signed)
I called the daughter. The patient has had some problems with gait instability, clouding of mentation. The patient is on gabapentin, it looks like she is on a 400 mg capsule bearing the dosing from 2-4 tablets daily I would first check and see what the patient is actually taking, may need to try to cut back on the gabapentin dosing as it can cause gait instability and cognitive clouding.

## 2016-12-15 NOTE — Telephone Encounter (Signed)
Pt's daughter Juliann Pulse called said her balance is worse, she is more confused-going to wrong appts. Today she was leaving an appt and she was walking down the hall, stopped and slid down the wall to the floor. Son-in-law was with her, they got her into a wheelchair and let her sit in a room for awhile. She said the pt has been confused for awhile now but she has not told Dr Jannifer Franklin. Pt's daughter said she has accompanied the patient to the last appt but she is not aware of what medications was prescribed for the patient and whether the pt said she was confused or not. There is not a DPR. She is requesting to call her and not the patient.

## 2016-12-22 NOTE — Telephone Encounter (Addendum)
Pt's daughter called back today, said if the pt gets up her head feels like she can't think clearly, legs feel like they are going to buckle. Please call asap

## 2016-12-22 NOTE — Telephone Encounter (Signed)
I called the daughter. The patient is still having some gait instability, she has cut back on the gabapentin taking 400 mg daily, she had just gone up on the propranolol from 20 mg twice daily to 40 mg twice daily. It appears that the patient feels cloudy headed when she is standing up, better with sitting, she may have some orthostasis on the propranolol. She will cut back on the dose taking 20 mg twice daily. If she is feeling no better in 5 or 6 days, I will need to see her in the office.

## 2016-12-23 ENCOUNTER — Emergency Department (HOSPITAL_COMMUNITY): Payer: Medicare Other

## 2016-12-23 ENCOUNTER — Observation Stay (HOSPITAL_COMMUNITY)
Admission: EM | Admit: 2016-12-23 | Discharge: 2016-12-25 | Disposition: A | Discharge status: Home / Self Care | Payer: Medicare Other | Attending: Internal Medicine | Admitting: Internal Medicine

## 2016-12-23 ENCOUNTER — Telehealth: Payer: Self-pay | Admitting: Neurology

## 2016-12-23 ENCOUNTER — Encounter (HOSPITAL_COMMUNITY): Payer: Self-pay

## 2016-12-23 DIAGNOSIS — Z9842 Cataract extraction status, left eye: Secondary | ICD-10-CM | POA: Insufficient documentation

## 2016-12-23 DIAGNOSIS — G250 Essential tremor: Secondary | ICD-10-CM | POA: Diagnosis not present

## 2016-12-23 DIAGNOSIS — G9340 Encephalopathy, unspecified: Secondary | ICD-10-CM | POA: Diagnosis not present

## 2016-12-23 DIAGNOSIS — E785 Hyperlipidemia, unspecified: Secondary | ICD-10-CM | POA: Diagnosis not present

## 2016-12-23 DIAGNOSIS — E119 Type 2 diabetes mellitus without complications: Secondary | ICD-10-CM

## 2016-12-23 DIAGNOSIS — N183 Chronic kidney disease, stage 3 unspecified: Secondary | ICD-10-CM | POA: Diagnosis present

## 2016-12-23 DIAGNOSIS — N179 Acute kidney failure, unspecified: Secondary | ICD-10-CM | POA: Insufficient documentation

## 2016-12-23 DIAGNOSIS — R739 Hyperglycemia, unspecified: Secondary | ICD-10-CM

## 2016-12-23 DIAGNOSIS — G8929 Other chronic pain: Secondary | ICD-10-CM | POA: Diagnosis not present

## 2016-12-23 DIAGNOSIS — Z90710 Acquired absence of both cervix and uterus: Secondary | ICD-10-CM | POA: Insufficient documentation

## 2016-12-23 DIAGNOSIS — Z888 Allergy status to other drugs, medicaments and biological substances status: Secondary | ICD-10-CM | POA: Diagnosis not present

## 2016-12-23 DIAGNOSIS — Z6834 Body mass index (BMI) 34.0-34.9, adult: Secondary | ICD-10-CM | POA: Insufficient documentation

## 2016-12-23 DIAGNOSIS — I951 Orthostatic hypotension: Secondary | ICD-10-CM | POA: Insufficient documentation

## 2016-12-23 DIAGNOSIS — Z85828 Personal history of other malignant neoplasm of skin: Secondary | ICD-10-CM | POA: Insufficient documentation

## 2016-12-23 DIAGNOSIS — I10 Essential (primary) hypertension: Secondary | ICD-10-CM | POA: Diagnosis present

## 2016-12-23 DIAGNOSIS — W19XXXA Unspecified fall, initial encounter: Secondary | ICD-10-CM | POA: Diagnosis not present

## 2016-12-23 DIAGNOSIS — E1143 Type 2 diabetes mellitus with diabetic autonomic (poly)neuropathy: Secondary | ICD-10-CM | POA: Insufficient documentation

## 2016-12-23 DIAGNOSIS — E669 Obesity, unspecified: Secondary | ICD-10-CM | POA: Insufficient documentation

## 2016-12-23 DIAGNOSIS — Z96651 Presence of right artificial knee joint: Secondary | ICD-10-CM | POA: Insufficient documentation

## 2016-12-23 DIAGNOSIS — I129 Hypertensive chronic kidney disease with stage 1 through stage 4 chronic kidney disease, or unspecified chronic kidney disease: Secondary | ICD-10-CM | POA: Diagnosis not present

## 2016-12-23 DIAGNOSIS — Z9841 Cataract extraction status, right eye: Secondary | ICD-10-CM | POA: Insufficient documentation

## 2016-12-23 DIAGNOSIS — Z885 Allergy status to narcotic agent status: Secondary | ICD-10-CM | POA: Diagnosis not present

## 2016-12-23 DIAGNOSIS — Z881 Allergy status to other antibiotic agents status: Secondary | ICD-10-CM | POA: Diagnosis not present

## 2016-12-23 DIAGNOSIS — Z794 Long term (current) use of insulin: Secondary | ICD-10-CM | POA: Diagnosis not present

## 2016-12-23 DIAGNOSIS — E1121 Type 2 diabetes mellitus with diabetic nephropathy: Secondary | ICD-10-CM | POA: Insufficient documentation

## 2016-12-23 DIAGNOSIS — M6281 Muscle weakness (generalized): Secondary | ICD-10-CM | POA: Diagnosis not present

## 2016-12-23 DIAGNOSIS — E1122 Type 2 diabetes mellitus with diabetic chronic kidney disease: Secondary | ICD-10-CM | POA: Diagnosis not present

## 2016-12-23 DIAGNOSIS — K219 Gastro-esophageal reflux disease without esophagitis: Secondary | ICD-10-CM | POA: Diagnosis not present

## 2016-12-23 DIAGNOSIS — Z9889 Other specified postprocedural states: Secondary | ICD-10-CM | POA: Insufficient documentation

## 2016-12-23 DIAGNOSIS — Z79899 Other long term (current) drug therapy: Secondary | ICD-10-CM | POA: Diagnosis not present

## 2016-12-23 DIAGNOSIS — F32A Depression, unspecified: Secondary | ICD-10-CM | POA: Diagnosis present

## 2016-12-23 DIAGNOSIS — F329 Major depressive disorder, single episode, unspecified: Secondary | ICD-10-CM | POA: Insufficient documentation

## 2016-12-23 DIAGNOSIS — M549 Dorsalgia, unspecified: Secondary | ICD-10-CM | POA: Diagnosis not present

## 2016-12-23 LAB — BASIC METABOLIC PANEL
Anion gap: 14 (ref 5–15)
BUN: 36 mg/dL — ABNORMAL HIGH (ref 6–20)
CO2: 24 mmol/L (ref 22–32)
Calcium: 9.3 mg/dL (ref 8.9–10.3)
Chloride: 100 mmol/L — ABNORMAL LOW (ref 101–111)
Creatinine, Ser: 1.74 mg/dL — ABNORMAL HIGH (ref 0.44–1.00)
GFR calc Af Amer: 30 mL/min — ABNORMAL LOW (ref >60)
GFR calc non Af Amer: 26 mL/min — ABNORMAL LOW (ref >60)
Glucose, Bld: 314 mg/dL — ABNORMAL HIGH (ref 65–99)
Potassium: 3.9 mmol/L (ref 3.5–5.1)
Sodium: 138 mmol/L (ref 135–145)

## 2016-12-23 LAB — CBC
HCT: 37.2 % (ref 36.0–46.0)
Hemoglobin: 12.5 g/dL (ref 12.0–15.0)
MCH: 28.5 pg (ref 26.0–34.0)
MCHC: 33.6 g/dL (ref 30.0–36.0)
MCV: 84.7 fL (ref 78.0–100.0)
Platelets: 215 10*3/uL (ref 150–400)
RBC: 4.39 MIL/uL (ref 3.87–5.11)
RDW: 13.4 % (ref 11.5–15.5)
WBC: 8.3 10*3/uL (ref 4.0–10.5)

## 2016-12-23 LAB — CBG MONITORING, ED: Glucose-Capillary: 265 mg/dL — ABNORMAL HIGH (ref 65–99)

## 2016-12-23 MED ORDER — SODIUM CHLORIDE 0.9 % IV SOLN
INTRAVENOUS | Status: DC
  Administered 2016-12-24: 02:00:00 via INTRAVENOUS

## 2016-12-23 MED ORDER — SODIUM CHLORIDE 0.9 % IV BOLUS (SEPSIS)
500.0000 mL | Freq: Once | INTRAVENOUS | Status: AC
  Administered 2016-12-24: 500 mL via INTRAVENOUS

## 2016-12-23 NOTE — ED Provider Notes (Signed)
[facility] DEPT Provider Note   CSN: [id number] Arrival date & time: 12/23/16  2009     History   Chief Complaint Chief Complaint  Patient presents with  . Weakness    HPI [name] is a 83 y.o. female.  She is here for evaluation of weakness, ongoing, which is intermittent, and aggravated by walking.  Last week she was walking to her orthopedic physician's office when she fell against a wall.  She was able to get up with assistance, and completed the visit with the doctor.  Since then she noticed some bruising of her right toes and right knee.  Also, she has had increasing difficulty engaging in her usual activities such as cooking and cleaning, at home.  She uses a walker at home.  In the last couple of days she has had more persistent weakness, but does have some times of feeling comfortable, when she is at rest.  She denies fever, chills, cough, shortness of breath, abdominal or back pain.  There is been no dysuria or hematuria.  There are no other known modifying factors.  HPI  Past Medical History:  Diagnosis Date  . Anemia    as a teenager  . Arthritis   . Cancer ([place])    skin  . Cholelithiasis   . Chronic insomnia   . Chronic low back pain   . Complication of anesthesia   . DDD (degenerative disc disease), lumbar   . Diabetes mellitus    Iddm x 12 years  . Diabetic peripheral neuropathy ([place])   . Essential tremor 04/01/2015  . GERD (gastroesophageal reflux disease)   . Hx of acute renal failure 02/2014   admitted to [facility]  . Hx: UTI (urinary tract infection) 02/2014  . Hyperlipemia   . Hypertension   . Obesity   . Peripheral edema   . Pneumonia    as a child  . PONV (postoperative nausea and vomiting)    pt states Zofran not that helpful in the past, Phenergan works best for her  . Staph infection    1970's  . Umbilical hernia   . Wrist fracture    left October 2014    Patient Active Problem List   Diagnosis Date Noted  . Vertigo  05/16/2016  . Chest pain 05/15/2016  . Essential tremor 04/01/2015  . Chronic low back pain 04/01/2015  . Lumbar herniated disc 02/06/2015  . Lumbar radiculopathy 03/20/2014  . UTI (lower urinary tract infection) 03/18/2014  . Hyperglycemia 03/18/2014  . Dyslipidemia 03/18/2014  . Lumbar stenosis 03/16/2014  . HTN (hypertension) 02/12/2014  . Diabetes mellitus ([place]) 02/12/2014  . Acute renal failure ([place]) 02/11/2014  . H/O total knee replacement 08/11/2013  . Gallstones 10/01/2011    Past Surgical History:  Procedure Laterality Date  . ABDOMINAL HYSTERECTOMY    . BACK SURGERY  2005  . CARDIAC CATHETERIZATION  2000   Dr Einar Gip  . CATARACT EXTRACTION Bilateral    w/ lens implants  . COLONOSCOPY  >10 years  . EYE SURGERY    . HERNIA REPAIR    . LUMBAR LAMINECTOMY/DECOMPRESSION MICRODISCECTOMY Left 03/16/2014   Procedure: LUMBAR LAMINECTOMY/DECOMPRESSION MICRODISCECTOMY 1 LEVEL  lumbar level three/four;  Surgeon: [name], MD;  Location: [facility];  Service: Neurosurgery;  Laterality: Left;  . LUMBAR LAMINECTOMY/DECOMPRESSION MICRODISCECTOMY Right 02/06/2015   Procedure: Right L4-5 Microdiskectomy;  Surgeon: Leeroy Cha, MD;  Location: [facility];  Service: Neurosurgery;  Laterality: Right;  Right L4-5 Microdiskectomy  .  NECK SURGERY    . OOPHORECTOMY    . TOTAL KNEE ARTHROPLASTY Right     OB History    No data available       Home Medications    Prior to Admission medications   Medication Sig Start Date End Date Taking? Authorizing Provider  chlorthalidone (HYGROTON) 25 MG tablet Take 25 mg by mouth daily.  01/23/14  Yes Historical Provider, MD  cloNIDine (CATAPRES) 0.1 MG tablet Take 0.1 mg by mouth 2 (two) times daily.     Yes Historical Provider, MD  DULoxetine (CYMBALTA) 30 MG capsule Take 1 capsule (30 mg total) by mouth 2 (two) times daily. 12/01/16  Yes Kathrynn Ducking, MD  gabapentin (NEURONTIN) 400 MG capsule Take 400 mg by mouth daily.    Yes Historical  Provider, MD  HUMALOG MIX 75/25 KWIKPEN (75-25) 100 UNIT/ML Kwikpen Inject 25-50 Units into the skin 2 (two) times daily. Inject 50 units in the am and Inject 25 in the pm. 06/04/15  Yes Historical Provider, MD  losartan (COZAAR) 100 MG tablet Take 100 mg by mouth daily.   Yes Historical Provider, MD  metFORMIN (GLUCOPHAGE) 1000 MG tablet Take 1,000 mg by mouth 2 (two) times daily with a meal.    Yes Historical Provider, MD  propranolol (INDERAL) 20 MG tablet Take 2 tablets (40 mg total) by mouth 2 (two) times daily. Patient taking differently: Take 20 mg by mouth 2 (two) times daily.  12/01/16  Yes Kathrynn Ducking, MD  TRADJENTA 5 MG TABS tablet Take 5 mg by mouth daily. 07/24/13  Yes Historical Provider, MD  acetaminophen (TYLENOL) 500 MG tablet Take 1,000 mg by mouth every 6 (six) hours as needed for moderate pain.    Historical Provider, MD  diclofenac sodium (VOLTAREN) 1 % GEL Apply 4 g topically 4 (four) times daily. Patient not taking: Reported on 12/23/2016 10/01/16   Leo Grosser, MD  promethazine (PHENERGAN) 25 MG tablet Take 1 tablet (25 mg total) by mouth every 6 (six) hours as needed for nausea or vomiting. Patient not taking: Reported on 12/23/2016 05/03/14   Charlesetta Shanks, MD  traMADol (ULTRAM) 50 MG tablet Take 1 tablet (50 mg total) by mouth every 6 (six) hours as needed. Patient not taking: Reported on 12/23/2016 06/06/15   Virgel Manifold, MD    Family History Family History  Problem Relation Age of Onset  . Cancer Brother     prostate  . Heart disease Brother   . Alzheimer's disease Brother   . Stroke Mother   . Hypertension Mother   . COPD Brother   . Cancer Brother     Prostate cancer  . Hypertension Sister     Congestive heart failure  . Heart attack Son     Social History Social History  Substance Use Topics  . Smoking status: Never Smoker  . Smokeless tobacco: Never Used  . Alcohol use No     Allergies   Ciprofloxacin; Codeine; Dilaudid [hydromorphone hcl];  Mysoline [primidone]; and Oxycodone   Review of Systems Review of Systems  All other systems reviewed and are negative.    Physical Exam Updated Vital Signs BP (!) 177/66 (BP Location: Right Arm)   Pulse 83   Temp 98.5 F (36.9 C) (Oral)   Resp (!) 24   SpO2 96%   Physical Exam  Constitutional: She is oriented to person, place, and time. She appears well-developed.  Elderly, frail  HENT:  Head: Normocephalic and atraumatic.  Eyes: Conjunctivae  and EOM are normal. Pupils are equal, round, and reactive to light.  Neck: Normal range of motion and phonation normal. Neck supple.  Cardiovascular: Normal rate and regular rhythm.   Pulmonary/Chest: Effort normal and breath sounds normal. She exhibits no tenderness.  Abdominal: Soft. She exhibits no distension. There is no tenderness. There is no guarding.  Musculoskeletal: Normal range of motion.  Neurological: She is alert and oriented to person, place, and time. She exhibits normal muscle tone.  No dysarthria and aphasia or nystagmus.  Mild bilateral tremor.  Normal strength and sensation arms and legs bilaterally.  No pronator drift.  No ataxia.  Skin: Skin is warm and dry.  Psychiatric: She has a normal mood and affect. Her behavior is normal. Judgment and thought content normal.  Nursing note and vitals reviewed.    ED Treatments / Results  Labs (all labs ordered are listed, but only abnormal results are displayed) Labs Reviewed  BASIC METABOLIC PANEL - Abnormal; Notable for the following:       Result Value   Chloride 100 (*)    Glucose, Bld 314 (*)    BUN 36 (*)    Creatinine, Ser 1.74 (*)    GFR calc non Af Amer 26 (*)    GFR calc Af Amer 30 (*)    All other components within normal limits  URINALYSIS, ROUTINE W REFLEX MICROSCOPIC - Abnormal; Notable for the following:    APPearance HAZY (*)    Glucose, UA >=500 (*)    Squamous Epithelial / LPF 0-5 (*)    All other components within normal limits  CBG  MONITORING, ED - Abnormal; Notable for the following:    Glucose-Capillary 265 (*)    All other components within normal limits  CBC    EKG  EKG Interpretation  Date/Time:  Wednesday December 23 2016 20:41:11 EDT Ventricular Rate:  82 PR Interval:    QRS Duration: 87 QT Interval:  368 QTC Calculation: 430 R Axis:   6 Text Interpretation:  Sinus rhythm Abnormal R-wave progression, early transition LVH by voltage since last tracing no significant change Confirmed by Eulis Foster  MD, Ethon Wymer [id number]) on 12/23/2016 10:24:31 PM       Radiology Ct Head Wo Contrast  Result Date: 12/23/2016 CLINICAL DATA:  Weakness with ambulation.  History of diabetes. EXAM: CT HEAD WITHOUT CONTRAST TECHNIQUE: Contiguous axial images were obtained from the base of the skull through the vertex without intravenous contrast. COMPARISON:  MRI brain 05/15/2016.  CT head 12/21/2015. FINDINGS: Brain: No evidence of acute infarction, hemorrhage, hydrocephalus, extra-axial collection or mass lesion/mass effect. Mild cerebral atrophy. Patchy low-attenuation changes in the deep white matter consistent small vessel ischemia. Vascular: No hyperdense vessel or unexpected calcification. Skull: Normal. Negative for fracture or focal lesion. Sinuses/Orbits: No acute finding. Other: No significant change since previous study. IMPRESSION: No acute intracranial abnormalities. Mild chronic atrophy and small vessel ischemic changes. Electronically Signed   By: Lucienne Capers M.D.   On: 12/23/2016 23:46    Procedures Procedures (including critical care time)  Medications Ordered in ED Medications  sodium chloride 0.9 % bolus 500 mL (not administered)  0.9 %  sodium chloride infusion (not administered)     Initial Impression / Assessment and Plan / ED Course  I have reviewed the triage vital signs and the nursing notes.  Pertinent labs & imaging results that were available during my care of the patient were reviewed by me and  considered in my medical decision making (see  chart for details).     Medications  sodium chloride 0.9 % bolus 500 mL (not administered)  0.9 %  sodium chloride infusion (not administered)    Patient Vitals for the past 24 hrs:  BP Temp Temp src Pulse Resp SpO2  12/23/16 2350 (!) 177/66 - - 83 (!) 24 96 %  12/23/16 2201 (!) 130/51 - - 77 13 94 %  12/23/16 2022 (!) 128/52 98.5 F (36.9 C) Oral 79 18 97 %    1:13 AM Reevaluation with update and discussion. After initial assessment and treatment, an updated evaluation reveals she was able to walk but requires assistance.  Orthostatic blood pressure and pulses done are consistent with orthostasis.  Patient states she has really been checking her blood pressure at home.  Findings discussed with patient and family members, all questions answered.Daleen Bo L   1:16 AM-Consult complete with Hospitalist. Patient case explained and discussed.  Hospitalist agrees to admit patient for further evaluation and treatment. Call ended at 01: 5 5  Final Clinical Impressions(s) / ED Diagnoses   Final diagnoses:  AKI (acute kidney injury) ([place])  Hyperglycemia    Weakness consistent with prostatic changes, and mild AK is present.  Suspect hyperglycemia as causative factor.  Apparent UTI.  Doubt DKA, or impending vascular collapse.  Nursing Notes Reviewed/ Care Coordinated Applicable Imaging Reviewed Interpretation of Laboratory Data incorporated into ED treatment  Plan: Admit    Plan: Admit  New Prescriptions New Prescriptions   No medications on file     Daleen Bo, MD 12/24/16 0155

## 2016-12-23 NOTE — Telephone Encounter (Signed)
Patients daughter called the office in reference to her mother not doing any better. Patient is still confused, head feels foggy along with worsening in tremors, legs feel they are going to buckle.  Daughter would like to schedule an appointment per Dr. Jannifer Franklin.  Please call

## 2016-12-23 NOTE — ED Notes (Signed)
Pt. documented in error Saline Lock IV,Maintain IV access.

## 2016-12-23 NOTE — Telephone Encounter (Signed)
Called and spoke with pt daughter. Scheduled appt for 12/28/16 at 330pm check in 300pm. Ok to use slot per CW,MD.   She states they may bring her to ER to be evaluated. I advised if her sx are severe and they feel she cannot wait until Monday, to bring her to ER. IF anything changes, advised her to call our office and let us know. She verbalized understanding.

## 2016-12-23 NOTE — ED Triage Notes (Signed)
For about one week weakness in legs and states family confusion states she is unable to walk without assistance. Alert and oriented x 3 moves all extremities.

## 2016-12-23 NOTE — Telephone Encounter (Signed)
The patient is still having problems with feeling dizzy with standing. She feels as if the legs will collapse. She never went up on the Inderal taking 40 mg twice daily, she is still on 20 mg twice daily. The etiology of these arms with walking is not clear, I'm unable to see her this week, the patient may go to the emergency room.  I will have the patient placed on the cancellation list to be worked in sometime in the next week if possible.

## 2016-12-23 NOTE — ED Notes (Signed)
Per pt's daughter, before the patient's legs become weak, she gets a "blank stare on her face." Pt has hx of DM II, no hx of stroke or heart attack. Pt states she just feels like "her legs are going to buckle." Per pt and family, pt has not hit her head, usually grabs onto a wall and drops herself to the floor.

## 2016-12-23 NOTE — ED Notes (Signed)
Patient ambulated in hallway and had a steady guard assist throughout walk. Felt weak throughout walk.

## 2016-12-24 DIAGNOSIS — N179 Acute kidney failure, unspecified: Secondary | ICD-10-CM

## 2016-12-24 DIAGNOSIS — N183 Chronic kidney disease, stage 3 unspecified: Secondary | ICD-10-CM | POA: Diagnosis present

## 2016-12-24 DIAGNOSIS — G9340 Encephalopathy, unspecified: Secondary | ICD-10-CM | POA: Diagnosis not present

## 2016-12-24 DIAGNOSIS — I951 Orthostatic hypotension: Secondary | ICD-10-CM

## 2016-12-24 DIAGNOSIS — F32A Depression, unspecified: Secondary | ICD-10-CM | POA: Diagnosis present

## 2016-12-24 DIAGNOSIS — I10 Essential (primary) hypertension: Secondary | ICD-10-CM

## 2016-12-24 DIAGNOSIS — W19XXXA Unspecified fall, initial encounter: Secondary | ICD-10-CM | POA: Diagnosis present

## 2016-12-24 DIAGNOSIS — F329 Major depressive disorder, single episode, unspecified: Secondary | ICD-10-CM | POA: Diagnosis present

## 2016-12-24 LAB — CBC
HCT: 34.8 % — ABNORMAL LOW (ref 36.0–46.0)
Hemoglobin: 11.6 g/dL — ABNORMAL LOW (ref 12.0–15.0)
MCH: 28 pg (ref 26.0–34.0)
MCHC: 33.3 g/dL (ref 30.0–36.0)
MCV: 84.1 fL (ref 78.0–100.0)
Platelets: 192 10*3/uL (ref 150–400)
RBC: 4.14 MIL/uL (ref 3.87–5.11)
RDW: 13.5 % (ref 11.5–15.5)
WBC: 8.5 10*3/uL (ref 4.0–10.5)

## 2016-12-24 LAB — URINALYSIS, ROUTINE W REFLEX MICROSCOPIC
Bacteria, UA: NONE SEEN
Bilirubin Urine: NEGATIVE
Glucose, UA: >=500 mg/dL — AB
Hgb urine dipstick: NEGATIVE
Ketones, ur: NEGATIVE mg/dL
Leukocytes, UA: NEGATIVE
Nitrite: NEGATIVE
Protein, ur: NEGATIVE mg/dL
Specific Gravity, Urine: 1.017 (ref 1.005–1.030)
pH: 5 (ref 5.0–8.0)

## 2016-12-24 LAB — GLUCOSE, CAPILLARY
Glucose-Capillary: 207 mg/dL — ABNORMAL HIGH (ref 65–99)
Glucose-Capillary: 161 mg/dL — ABNORMAL HIGH (ref 65–99)
Glucose-Capillary: 166 mg/dL — ABNORMAL HIGH (ref 65–99)
Glucose-Capillary: 111 mg/dL — ABNORMAL HIGH (ref 65–99)
Glucose-Capillary: 179 mg/dL — ABNORMAL HIGH (ref 65–99)

## 2016-12-24 LAB — BASIC METABOLIC PANEL
Anion gap: 11 (ref 5–15)
BUN: 31 mg/dL — ABNORMAL HIGH (ref 6–20)
CO2: 23 mmol/L (ref 22–32)
Calcium: 8.6 mg/dL — ABNORMAL LOW (ref 8.9–10.3)
Chloride: 104 mmol/L (ref 101–111)
Creatinine, Ser: 1.2 mg/dL — ABNORMAL HIGH (ref 0.44–1.00)
GFR calc Af Amer: 47 mL/min — ABNORMAL LOW (ref >60)
GFR calc non Af Amer: 41 mL/min — ABNORMAL LOW (ref >60)
Glucose, Bld: 142 mg/dL — ABNORMAL HIGH (ref 65–99)
Potassium: 3.3 mmol/L — ABNORMAL LOW (ref 3.5–5.1)
Sodium: 138 mmol/L (ref 135–145)

## 2016-12-24 LAB — CREATININE, URINE, RANDOM: Creatinine, Urine: 41.75 mg/dL

## 2016-12-24 MED ORDER — DULOXETINE HCL 30 MG PO CPEP
30.0000 mg | ORAL_CAPSULE | Freq: Two times a day (BID) | ORAL | Status: DC
  Administered 2016-12-24 – 2016-12-25 (×2): 30 mg via ORAL
  Filled 2016-12-24 (×3): qty 1

## 2016-12-24 MED ORDER — INSULIN ASPART PROT & ASPART (70-30 MIX) 100 UNIT/ML ~~LOC~~ SUSP
25.0000 [IU] | Freq: Every day | SUBCUTANEOUS | Status: DC
Start: 2016-12-24 — End: 2016-12-26
  Administered 2016-12-24 (×2): 25 [IU] via SUBCUTANEOUS
  Filled 2016-12-24: qty 10

## 2016-12-24 MED ORDER — LOSARTAN POTASSIUM 50 MG PO TABS
100.0000 mg | ORAL_TABLET | Freq: Every day | ORAL | Status: DC
  Administered 2016-12-25: 100 mg via ORAL
  Filled 2016-12-24: qty 2

## 2016-12-24 MED ORDER — INSULIN ASPART PROT & ASPART (70-30 MIX) 100 UNIT/ML ~~LOC~~ SUSP
50.0000 [IU] | Freq: Every day | SUBCUTANEOUS | Status: DC
  Administered 2016-12-24 – 2016-12-25 (×2): 50 [IU] via SUBCUTANEOUS

## 2016-12-24 MED ORDER — PROPRANOLOL HCL 40 MG PO TABS
40.0000 mg | ORAL_TABLET | Freq: Two times a day (BID) | ORAL | Status: DC
  Administered 2016-12-24 – 2016-12-25 (×4): 40 mg via ORAL
  Filled 2016-12-24 (×4): qty 1

## 2016-12-24 MED ORDER — GABAPENTIN 400 MG PO CAPS
400.0000 mg | ORAL_CAPSULE | Freq: Every day | ORAL | Status: DC
  Administered 2016-12-24 – 2016-12-25 (×2): 400 mg via ORAL
  Filled 2016-12-24 (×2): qty 1

## 2016-12-24 MED ORDER — INSULIN ASPART 100 UNIT/ML ~~LOC~~ SOLN
0.0000 [IU] | Freq: Three times a day (TID) | SUBCUTANEOUS | Status: DC
  Administered 2016-12-24 – 2016-12-25 (×3): 2 [IU] via SUBCUTANEOUS
  Administered 2016-12-25: 1 [IU] via SUBCUTANEOUS

## 2016-12-24 MED ORDER — ENSURE ENLIVE PO LIQD: 237.0000 mL | ORAL | Status: DC

## 2016-12-24 MED ORDER — CLONIDINE HCL 0.1 MG PO TABS
0.1000 mg | ORAL_TABLET | Freq: Two times a day (BID) | ORAL | Status: DC
  Administered 2016-12-24 (×2): 0.1 mg via ORAL
  Filled 2016-12-24 (×2): qty 1

## 2016-12-24 MED ORDER — ENOXAPARIN SODIUM 40 MG/0.4ML ~~LOC~~ SOLN
40.0000 mg | SUBCUTANEOUS | Status: DC
  Administered 2016-12-24 – 2016-12-25 (×2): 40 mg via SUBCUTANEOUS
  Filled 2016-12-24 (×2): qty 0.4

## 2016-12-24 MED ORDER — HYDRALAZINE HCL 20 MG/ML IJ SOLN: 5.0000 mg | INTRAMUSCULAR | Status: DC | PRN

## 2016-12-24 MED ORDER — LOSARTAN POTASSIUM 50 MG PO TABS: 50.0000 mg | ORAL_TABLET | Freq: Once | ORAL | 0 refills | Status: DC

## 2016-12-24 MED ORDER — LOSARTAN POTASSIUM 50 MG PO TABS
100.0000 mg | ORAL_TABLET | Freq: Once | ORAL | Status: AC
  Administered 2016-12-24: 100 mg via ORAL
  Filled 2016-12-24: qty 2

## 2016-12-24 MED ORDER — CHLORTHALIDONE 25 MG PO TABS
25.0000 mg | ORAL_TABLET | Freq: Once | ORAL | Status: AC
  Administered 2016-12-24: 25 mg via ORAL
  Filled 2016-12-24: qty 1

## 2016-12-24 MED ORDER — ACETAMINOPHEN 325 MG PO TABS: 650.0000 mg | ORAL_TABLET | Freq: Four times a day (QID) | ORAL | Status: DC | PRN

## 2016-12-24 MED ORDER — ONDANSETRON HCL 4 MG/2ML IJ SOLN: 4.0000 mg | Freq: Four times a day (QID) | INTRAMUSCULAR | Status: DC | PRN

## 2016-12-24 MED ORDER — SODIUM CHLORIDE 0.9% FLUSH
3.0000 mL | Freq: Two times a day (BID) | INTRAVENOUS | Status: DC
  Administered 2016-12-24 – 2016-12-25 (×3): 3 mL via INTRAVENOUS

## 2016-12-24 MED ORDER — ONDANSETRON HCL 4 MG PO TABS: 4.0000 mg | ORAL_TABLET | Freq: Four times a day (QID) | ORAL | Status: DC | PRN

## 2016-12-24 MED ORDER — ZOLPIDEM TARTRATE 5 MG PO TABS: 5.0000 mg | ORAL_TABLET | Freq: Every evening | ORAL | Status: DC | PRN

## 2016-12-24 NOTE — Progress Notes (Signed)
Initial Nutrition Assessment  DOCUMENTATION CODES:   Obesity unspecified  INTERVENTION:   Ensure Enlive po daily, each supplement provides 350 kcal and 20 grams of protein  NUTRITION DIAGNOSIS:   Inadequate oral intake related to acute illness, AMS as evidenced by per patient/family report.  GOAL:   Patient will meet greater than or equal to 90% of their needs  MONITOR:   PO intake, Supplement acceptance, Labs, Weight trends  REASON FOR ASSESSMENT:   Malnutrition Screening Tool    ASSESSMENT:   84 y.o. female with medical history significant of hypertension, hyperlipidemia, diabetes mellitus, GERD, depression, essential tremor, chronic back pain, chronic kidney disease-stage III, who presents with altered mental status, fall and bilateral leg weakness.    Met with pt in room today. Pt reports poor appetite and oral intake for two weeks pta. Pt currently eating 50% meals. Pt ate egg Mcmuffin for breakfast today. Per chart, pt has lost 8lbs(3%) in 3 weeks; this is significant given history. RD will order Ensure daily for pt until she is eating well. Pt with hypokalemia today; monitor and supplement as needed per MD discretion.    Medications reviewed and include: lovenox, insulin  Labs reviewed: K 3.3(L), BUN 31(H), creat 1.2(H), Ca 8.6(L) cbgs- 314, 142 x 24 hrs AIC 7.8(H) 05/2016  Nutrition-Focused physical exam completed. Findings are no fat depletion, no muscle depletion, and mild edema.   Diet Order:  Diet heart healthy/carb modified Room service appropriate? Yes; Fluid consistency: Thin  Skin:  Reviewed, no issues  Last BM:  4/19  Height:   Ht Readings from Last 1 Encounters:  12/24/16 5' 8" (1.727 m)    Weight:   Wt Readings from Last 1 Encounters:  12/24/16 229 lb 11.2 oz (104.2 kg)    Ideal Body Weight:  63.6 kg  BMI:  Body mass index is 34.93 kg/m.  Estimated Nutritional Needs:   Kcal:  1600-1900kcal/day   Protein:  83-104g/day   Fluid:   >1.6L/day   EDUCATION NEEDS:   No education needs identified at this time   , RD, LDN Pager #- [phone]  

## 2016-12-24 NOTE — H&P (Addendum)
History and Physical    [name] WGN:[id number] DOB: 1932/12/11 DOA: 12/23/2016  Referring MD/NP/PA:   PCP: Aretta Nip, MD   Patient coming from:  The patient is coming from home.  At baseline, pt is independent for most of ADL.   Chief Complaint: AMS, fall and leg weakness bilaterally  HPI: [name] is a 83 y.o. female with medical history significant of hypertension, hyperlipidemia, diabetes mellitus, GERD, depression, essential tremor, chronic back pain, chronic kidney disease-stage III, who presents with altered mental status, fall and bilateral leg weakness.  Per patient's daughter, patient has mild confusion today. She also has bilateral leg weakness and "leg buckle", but no unilateral numbness, facial droop, hearing loss, slurred speech. Patient described "feeling clouded in the head". When I saw pt in ED, she is mildly confused, but is oriented x 3. Her daughter states that the patient had minor fall last week without any injury. No loss of consciousness. Patient strongly denies head or neck injury. ED physician reported that patient had dizziness, but the patient strongly denies any dizziness to me. Patient denies chest pain, SOB, cough, symptoms of UTI, nausea, vomiting, diarrhea or abdominal pain.  ED Course: pt was found to have positive orthostatic vital signs, WBC 8.3, negative urinalysis, worsening renal function, temperature normal, no tachycardia, O2 sat 96% on room air, blood sugar 314 with normal AG, negative CT head for acute intracranial abnormalities. Patient is placed on telemetry bed for observation.  Review of Systems:   General: no fevers, chills, no changes in body weight, has fatigue HEENT: no blurry vision, hearing changes or sore throat Respiratory: no dyspnea, coughing, wheezing CV: no chest pain, no palpitations GI: no nausea, vomiting, abdominal pain, diarrhea, constipation GU: no dysuria, burning on urination, increased urinary frequency,  hematuria  Ext: no leg edema.  Neuro: no unilateral weakness, numbness, or tingling, no vision change or hearing loss. Has confusion and bilateral leg weakness Skin: no rash, no skin tear. MSK: No muscle spasm, no deformity, no limitation of range of movement in spin Heme: No easy bruising.  Travel history: No recent long distant travel.  Allergy:  Allergies  Allergen Reactions  . Ciprofloxacin Other (See Comments)    Acute kidney failure  . Codeine Nausea And Vomiting  . Dilaudid [Hydromorphone Hcl] Nausea And Vomiting and Other (See Comments)    Severe sweating  . Mysoline [Primidone] Nausea Only  . Oxycodone Nausea And Vomiting    Past Medical History:  Diagnosis Date  . Anemia    as a teenager  . Arthritis   . Cancer ([place])    skin  . Cholelithiasis   . Chronic insomnia   . Chronic low back pain   . Complication of anesthesia   . DDD (degenerative disc disease), lumbar   . Diabetes mellitus    Iddm x 12 years  . Diabetic peripheral neuropathy ([place])   . Essential tremor 04/01/2015  . GERD (gastroesophageal reflux disease)   . Hx of acute renal failure 02/2014   admitted to [facility]  . Hx: UTI (urinary tract infection) 02/2014  . Hyperlipemia   . Hypertension   . Obesity   . Peripheral edema   . Pneumonia    as a child  . PONV (postoperative nausea and vomiting)    pt states Zofran not that helpful in the past, Phenergan works best for her  . Staph infection    1970's  . Umbilical hernia   . Wrist fracture  left October 2014    Past Surgical History:  Procedure Laterality Date  . ABDOMINAL HYSTERECTOMY    . BACK SURGERY  2005  . CARDIAC CATHETERIZATION  2000   Dr Einar Gip  . CATARACT EXTRACTION Bilateral    w/ lens implants  . COLONOSCOPY  >10 years  . EYE SURGERY    . HERNIA REPAIR    . LUMBAR LAMINECTOMY/DECOMPRESSION MICRODISCECTOMY Left 03/16/2014   Procedure: LUMBAR LAMINECTOMY/DECOMPRESSION MICRODISCECTOMY 1 LEVEL  lumbar level three/four;   Surgeon: [name], MD;  Location: [place];  Service: Neurosurgery;  Laterality: Left;  . LUMBAR LAMINECTOMY/DECOMPRESSION MICRODISCECTOMY Right 02/06/2015   Procedure: Right L4-5 Microdiskectomy;  Surgeon: Leeroy Cha, MD;  Location: [place] [facility];  Service: Neurosurgery;  Laterality: Right;  Right L4-5 Microdiskectomy  . NECK SURGERY    . OOPHORECTOMY    . TOTAL KNEE ARTHROPLASTY Right     Social History:  reports that she has never smoked. She has never used smokeless tobacco. She reports that she does not drink alcohol or use drugs.  Family History:  Family History  Problem Relation Age of Onset  . Cancer Brother     prostate  . Heart disease Brother   . Alzheimer's disease Brother   . Stroke Mother   . Hypertension Mother   . COPD Brother   . Cancer Brother     Prostate cancer  . Hypertension Sister     Congestive heart failure  . Heart attack Son      Prior to Admission medications   Medication Sig Start Date End Date Taking? Authorizing Provider  chlorthalidone (HYGROTON) 25 MG tablet Take 25 mg by mouth daily.  01/23/14  Yes Historical Provider, MD  cloNIDine (CATAPRES) 0.1 MG tablet Take 0.1 mg by mouth 2 (two) times daily.     Yes Historical Provider, MD  DULoxetine (CYMBALTA) 30 MG capsule Take 1 capsule (30 mg total) by mouth 2 (two) times daily. 12/01/16  Yes Kathrynn Ducking, MD  gabapentin (NEURONTIN) 400 MG capsule Take 400 mg by mouth daily.    Yes Historical Provider, MD  HUMALOG MIX 75/25 KWIKPEN (75-25) 100 UNIT/ML Kwikpen Inject 25-50 Units into the skin 2 (two) times daily. Inject 50 units in the am and Inject 25 in the pm. 06/04/15  Yes Historical Provider, MD  losartan (COZAAR) 100 MG tablet Take 100 mg by mouth daily.   Yes Historical Provider, MD  metFORMIN (GLUCOPHAGE) 1000 MG tablet Take 1,000 mg by mouth 2 (two) times daily with a meal.    Yes Historical Provider, MD  propranolol (INDERAL) 20 MG tablet Take 2 tablets (40 mg total) by mouth 2 (two)  times daily. Patient taking differently: Take 20 mg by mouth 2 (two) times daily.  12/01/16  Yes Kathrynn Ducking, MD  TRADJENTA 5 MG TABS tablet Take 5 mg by mouth daily. 07/24/13  Yes Historical Provider, MD  acetaminophen (TYLENOL) 500 MG tablet Take 1,000 mg by mouth every 6 (six) hours as needed for moderate pain.    Historical Provider, MD  diclofenac sodium (VOLTAREN) 1 % GEL Apply 4 g topically 4 (four) times daily. Patient not taking: Reported on 12/23/2016 10/01/16   Leo Grosser, MD  promethazine (PHENERGAN) 25 MG tablet Take 1 tablet (25 mg total) by mouth every 6 (six) hours as needed for nausea or vomiting. Patient not taking: Reported on 12/23/2016 05/03/14   Charlesetta Shanks, MD  traMADol (ULTRAM) 50 MG tablet Take 1 tablet (50 mg total) by mouth  every 6 (six) hours as needed. Patient not taking: Reported on 12/23/2016 06/06/15   Virgel Manifold, MD    Physical Exam: Vitals:   12/24/16 0100 12/24/16 0200 12/24/16 0214 12/24/16 0248  BP: (!) 165/64 (!) 153/56 (!) 153/56 (!) 165/74  Pulse: 82 87 95 89  Resp: 18 16 18 18   Temp:    97.9 F (36.6 C)  TempSrc:    Oral  SpO2: 97% 94% 94% 98%  Weight:    104.2 kg (229 lb 11.2 oz)  Height:    5\' 8"  (1.727 m)   General: Not in acute distress HEENT:       Eyes: PERRL, EOMI, no scleral icterus.       ENT: No discharge from the ears and nose, no pharynx injection, no tonsillar enlargement.        Neck: No JVD, no bruit, no mass felt. Heme: No neck lymph node enlargement. Cardiac: S1/S2, RRR, No murmurs, No gallops or rubs. Respiratory: No rales, wheezing, rhonchi or rubs. GI: Soft, nondistended, nontender, no rebound pain, no organomegaly, BS present. GU: No hematuria Ext: No pitting leg edema bilaterally. 2+DP/PT pulse bilaterally. Musculoskeletal: No joint deformities, No joint redness or warmth, no limitation of ROM in spin. Skin: No rashes.  Neuro: mildly confused, but oriented X3, cranial nerves II-XII grossly intact, moves all  extremities normally. Muscle strength 5/5 in all extremities, sensation to light touch intact. Brachial reflex 2+ bilaterally. Negative Babinski's sign. Psych: Patient is not psychotic, no suicidal or hemocidal ideation.  Labs on Admission: I have personally reviewed following labs and imaging studies  CBC:  Recent Labs Lab 12/23/16 2047  WBC 8.3  HGB 12.5  HCT 37.2  MCV 84.7  PLT 527   Basic Metabolic Panel:  Recent Labs Lab 12/23/16 2047  NA 138  K 3.9  CL 100*  CO2 24  GLUCOSE 314*  BUN 36*  CREATININE 1.74*  CALCIUM 9.3   GFR: Estimated Creatinine Clearance: 31.5 mL/min (A) (by C-G formula based on SCr of 1.74 mg/dL (H)). Liver Function Tests: No results for input(s): AST, ALT, ALKPHOS, BILITOT, PROT, ALBUMIN in the last 168 hours. No results for input(s): LIPASE, AMYLASE in the last 168 hours. No results for input(s): AMMONIA in the last 168 hours. Coagulation Profile: No results for input(s): INR, PROTIME in the last 168 hours. Cardiac Enzymes: No results for input(s): CKTOTAL, CKMB, CKMBINDEX, TROPONINI in the last 168 hours. BNP (last 3 results) No results for input(s): PROBNP in the last 8760 hours. HbA1C: No results for input(s): HGBA1C in the last 72 hours. CBG:  Recent Labs Lab 12/23/16 2212 12/24/16 0316  GLUCAP 265* 207*   Lipid Profile: No results for input(s): CHOL, HDL, LDLCALC, TRIG, CHOLHDL, LDLDIRECT in the last 72 hours. Thyroid Function Tests: No results for input(s): TSH, T4TOTAL, FREET4, T3FREE, THYROIDAB in the last 72 hours. Anemia Panel: No results for input(s): VITAMINB12, FOLATE, FERRITIN, TIBC, IRON, RETICCTPCT in the last 72 hours. Urine analysis:    Component Value Date/Time   COLORURINE YELLOW 12/23/2016 2351   APPEARANCEUR HAZY (A) 12/23/2016 2351   LABSPEC 1.017 12/23/2016 2351   PHURINE 5.0 12/23/2016 2351   GLUCOSEU >=500 (A) 12/23/2016 2351   HGBUR NEGATIVE 12/23/2016 2351   BILIRUBINUR NEGATIVE 12/23/2016 2351     KETONESUR NEGATIVE 12/23/2016 2351   PROTEINUR NEGATIVE 12/23/2016 2351   UROBILINOGEN 0.2 05/03/2014 0942   NITRITE NEGATIVE 12/23/2016 2351   LEUKOCYTESUR NEGATIVE 12/23/2016 2351   Sepsis Labs: @LABRCNTIP (procalcitonin:4,lacticidven:4) )No results found  for this or any previous visit (from the past 240 hour(s)).   Radiological Exams on Admission: Ct Head Wo Contrast  Result Date: 12/23/2016 CLINICAL DATA:  Weakness with ambulation.  History of diabetes. EXAM: CT HEAD WITHOUT CONTRAST TECHNIQUE: Contiguous axial images were obtained from the base of the skull through the vertex without intravenous contrast. COMPARISON:  MRI brain 05/15/2016.  CT head 12/21/2015. FINDINGS: Brain: No evidence of acute infarction, hemorrhage, hydrocephalus, extra-axial collection or mass lesion/mass effect. Mild cerebral atrophy. Patchy low-attenuation changes in the deep white matter consistent small vessel ischemia. Vascular: No hyperdense vessel or unexpected calcification. Skull: Normal. Negative for fracture or focal lesion. Sinuses/Orbits: No acute finding. Other: No significant change since previous study. IMPRESSION: No acute intracranial abnormalities. Mild chronic atrophy and small vessel ischemic changes. Electronically Signed   By: Lucienne Capers M.D.   On: 12/23/2016 23:46     EKG: Independently reviewed. Sinus rhythm, QTC 4:30, nonspecific T-wave change.    Assessment/Plan Principal Problem:   Acute encephalopathy Active Problems:   HTN (hypertension)   Diabetes mellitus ([place])   Fall   Depression   Acute renal failure superimposed on stage 3 chronic kidney disease ([place])   Acute encephalopathy: Patient has mild confusion, but oriented 3 currently. Etiology is not clear. No signs of infection. Urinalysis negative. Patient was found to have positive orthostatic vital signs in ED, which may have partially contributed to altered mental status. Patient strongly denies head or neck injury  from recent fall. Refused CT neck. I discussed with patient about doing an MRI to rule out stroke, she refused it. She would like to be treated with IV fluid first to see if her condition improves.   -will place on tele bed  -IVF: 500 cc NS, then 125 cc/h -Frequent neuro check -pt/ot  Fall: CT head is negative for acute intracranial abnormalities. Patient strongly denies head or neck injury from recent fall. Refused CT neck.  -pt/ot  Bilateral leg weakness: Etiology is not clear. No other alarming symptoms, such as urinary incontinence of loss of control of bowel movement. -PT/OT  HTN: -hold losartan and chlorthalidone due to worsening renal function -Continue propranolol and clonidine -IV hydralazine when necessary  Diabetes mellitus: Last A1c 7.8 on 05/15/16, not well controled. Patient is not taking mixed insulin 75/25, metformin and Trajenta at home -will continue home dose of mix insulin 75/25  -SSI  Depression: Stable, no suicidal or homicidal ideations. -Continue home medications: Cymbalta,  AoCKD-III: Baseline Cre is  1.0-1.1, pt's Cre is 1.74 on admission. Likely due to prerenal secondary to dehydration and continuation of ARB, diruetics - IVF as above - Check FeUrea - Follow up renal function by BMP - Hold Cozaar and the chlorthalidone  DVT ppx:SQ Lovenox Code Status: Partial code (I discussed with the patient in the presence of her daughter and the son-in-law, and explained the meaning of CODE STATUS, patient wants to be partial code, OK for CPR, but no intubation). Family Communication: Yes, patient's daughter and the son-in-law at bed side Disposition Plan:  Anticipate discharge back to previous home environment Consults called: none Admission status: Obs / tele   Date of Service 12/24/2016    Ivor Costa [facility] Pager [phone]  If 7PM-7AM, please contact night-coverage [email or web address] Password [facility] 12/24/2016, 4:36 AM

## 2016-12-24 NOTE — Evaluation (Signed)
Physical Therapy Evaluation Patient Details Name: [name] MRN: [id number] DOB: Nov 06, 1932 Today's Date: 12/24/2016   History of Present Illness  Pt was admitted with acute encephalopathy.  She has a h/o HTN, DM, chronic back pain, essential tremor.    Clinical Impression  Pt admitted with above diagnosis. Pt currently with functional limitations due to the deficits listed below (see PT Problem List).  Pt will benefit from skilled PT to increase their independence and safety with mobility to allow discharge to the venue listed below.  MD in to see pt during session.  Pt with positive orthostatics (see docflowsheets) and limited mobility due to drop in BP upon using bathroom.  Recommend 24/7 supervision upon d/c for pt safety.     Follow Up Recommendations Home health PT;Supervision/Assistance - 24 hour    Equipment Recommendations  None recommended by PT    Recommendations for Other Services       Precautions / Restrictions Precautions Precautions: Fall Precaution Comments: orthostatic      Mobility  Bed Mobility Overal bed mobility: Modified Independent             General bed mobility comments: extra time, HOB raised  Transfers Overall transfer level: Needs assistance Equipment used: Rolling walker (2 wheeled) Transfers: Sit to/from Stand Sit to Stand: Min guard         General transfer comment: for safety, no dizziness upon initial standing although positive orthostatics, pt did become "foggy" "funny-headed" after standing from toilet, utilized grab bar  Ambulation/Gait Ambulation/Gait assistance: Min guard Ambulation Distance (Feet): 16 Feet Assistive device: Rolling walker (2 wheeled) Gait Pattern/deviations: Step-through pattern;Decreased stride length     General Gait Details: min/guard for safety, did well into bathroom however felt worse upon standing from toilet (had pt sit back down and perform UE movement, once she felt better ambulated to  recliner) BP 121/79 mmHg HR 79 bpm upon sitting in recliner  Stairs            Wheelchair Mobility    Modified Rankin (Stroke Patients Only)       Balance Overall balance assessment: History of Falls (reports falls due to legs feeling weak, giving out)                                           Pertinent Vitals/Pain Pain Assessment: No/denies pain    Home Living Family/patient expects to be discharged to:: Private residence Living Arrangements: Alone Available Help at Discharge: Family Type of Home: Apartment Home Access: Level entry     Home Layout: One level Home Equipment: Grab bars - tub/shower;Grab bars - toilet Additional Comments: pt lives in senior apt.  Daughter and granddaughter live nearby. Daughter has a tub seat she can use    Prior Function Level of Independence: Needs assistance         Comments: hasn't felt strong enough to make meal. Has 4 wheel walker and can sit. Granddaughter has come over to help her shower in the past couple of weeks     Hand Dominance   Dominant Hand: Right    Extremity/Trunk Assessment        Lower Extremity Assessment Lower Extremity Assessment: Generalized weakness       Communication   Communication: No difficulties  Cognition Arousal/Alertness: Awake/alert Behavior During Therapy: WFL for tasks assessed/performed  General Comments: pt reports increased confusion lately:  having son in law drive her to wrong MD office and getting ready for dr's appointment on wrong day.        General Comments      Exercises     Assessment/Plan    PT Assessment Patient needs continued PT services  PT Problem List Decreased strength;Decreased mobility;Decreased balance;Decreased activity tolerance       PT Treatment Interventions DME instruction;Therapeutic exercise;Therapeutic activities;Patient/family education;Functional mobility training;Gait  training    PT Goals (Current goals can be found in the Care Plan section)  Acute Rehab PT Goals PT Goal Formulation: With patient Time For Goal Achievement: 12/31/16 Potential to Achieve Goals: Good    Frequency Min 3X/week   Barriers to discharge        Co-evaluation               End of Session   Activity Tolerance: Treatment limited secondary to medical complications (Comment) (drop in BP) Patient left: in chair;with call bell/phone within reach;with family/visitor present;with nursing/sitter in room Nurse Communication: Mobility status PT Visit Diagnosis: Unsteadiness on feet (R26.81)    Time: 8144-8185 PT Time Calculation (min) (ACUTE ONLY): 42 min   Charges:   PT Evaluation $PT Eval Moderate Complexity: 1 Procedure     PT G Codes:   PT G-Codes **NOT FOR INPATIENT CLASS** Functional Assessment Tool Used: AM-PAC 6 Clicks Basic Mobility;Clinical judgement Functional Limitation: Mobility: Walking and moving around Mobility: Walking and Moving Around Current Status (U3149): At least 40 percent but less than 60 percent impaired, limited or restricted Mobility: Walking and Moving Around Goal Status [phone]): At least 1 percent but less than 20 percent impaired, limited or restricted    Carmelia Bake, PT, DPT 12/24/2016 Pager: [phone]  York Ram E 12/24/2016, 12:21 PM

## 2016-12-24 NOTE — Progress Notes (Signed)
At present time pt declined HH. Pt's daughter at bedside states,"pt was very weak and could not go to BR without two people helping her (pt) and that pt had to be placed in a recliner to come from bathroom to bed."

## 2016-12-24 NOTE — Evaluation (Signed)
Occupational Therapy Evaluation Patient Details Name: [name] MRN: [id number] DOB: Apr 12, 1933 Today's Date: 12/24/2016    History of Present Illness pt was admitted with acute encephalopathy.  She has a h/o HTN, DM, chronic back pain, esential tremor.     Clinical Impression   This 83 year old female was admitted for the above.  She will benefit from continued OT to increase safety and independence with adls. Goals in acute are for supervision level. She needs min guard overall for safety at this time    Follow Up Recommendations  Home health OT;Supervision/Assistance - 24 hour    Equipment Recommendations  None recommended by OT    Recommendations for Other Services       Precautions / Restrictions Precautions Precautions: Fall Precaution Comments: check orthostatics Restrictions Weight Bearing Restrictions: No      Mobility Bed Mobility Overal bed mobility: Modified Independent             General bed mobility comments: extra time, HOB raised  Transfers Overall transfer level: Needs assistance Equipment used: Rolling walker (2 wheeled) Transfers: Sit to/from Stand Sit to Stand: Min guard         General transfer comment: for safety    Balance Overall balance assessment: History of Falls                                         ADL either performed or assessed with clinical judgement   ADL Overall ADL's : Needs assistance/impaired Eating/Feeding: Independent   Grooming: Set up;Sitting   Upper Body Bathing: Set up;Sitting   Lower Body Bathing: Set up;Min guard;Sit to/from stand   Upper Body Dressing : Set up;Sitting   Lower Body Dressing: Min guard;Sit to/from stand   Toilet Transfer: Min guard;Ambulation;RW (bathroom and back to bed)             General ADL Comments: pt reports her head hasn't felt right and she feels legs give out.  She does not describe lightheadness nor spinning. Daughter is able to stay with pt.   Recommended HHOT/PT and pt is agreeable     Vision         Perception     Praxis      Pertinent Vitals/Pain Pain Assessment: No/denies pain     Hand Dominance Right   Extremity/Trunk Assessment Upper Extremity Assessment Upper Extremity Assessment: Overall WFL for tasks assessed (has bil essential tremor: doesn't interfere)           Communication Communication Communication: No difficulties   Cognition Arousal/Alertness: Awake/alert Behavior During Therapy: WFL for tasks assessed/performed                                   General Comments: pt reports increased confusion lately:  having son in law drive her to wrong MD office and getting ready for dr's appointment on wrong day.     General Comments   A lot of eval time was spent getting history. Pt fell last week coming out of orthopedic doctor's office. She feels like symptoms started after she began a new drug for nerve pain:  She has been talking to MD about this.  She fell last April before this.      Exercises     Shoulder Instructions      Home Living  Family/patient expects to be discharged to:: Private residence Living Arrangements: Alone Available Help at Discharge: Family Type of Home: Apartment             Bathroom Shower/Tub: Teacher, early years/pre: Handicapped height     Home Equipment: Grab bars - tub/shower;Grab bars - toilet   Additional Comments: pt lives in senior apt.  Daughter and granddaughter live nearby. Daughter has a tub seat she can use      Prior Functioning/Environment Level of Independence: Needs assistance        Comments: hasn't felt strong enough to make meal. Has 4 wheel walker and can sit. Granddaughter has come over to help her shower in the past couple of weeks        OT Problem List: Decreased strength;Decreased activity tolerance;Impaired balance (sitting and/or standing)      OT Treatment/Interventions: Self-care/ADL  training;DME and/or AE instruction;Balance training;Patient/family education    OT Goals(Current goals can be found in the care plan section) Acute Rehab OT Goals Patient Stated Goal: home today OT Goal Formulation: With patient Time For Goal Achievement: 12/31/16 Potential to Achieve Goals: Good ADL Goals Pt Will Transfer to Toilet: with supervision;ambulating;bedside commode Additional ADL Goal #1: pt will gather clothes at supervision level with RW Additional ADL Goal #2: pt will verbalize understanding of tub bench for increased safety for showering (vs having granddaughter assist with chair)  OT Frequency: Min 2X/week   Barriers to D/C:            Co-evaluation              End of Session    Activity Tolerance: Patient tolerated treatment well Patient left: in bed;with call bell/phone within reach;with family/visitor present  OT Visit Diagnosis: Unsteadiness on feet (R26.81)                Time: 2376-2831 OT Time Calculation (min): 30 min Charges:  OT General Charges $OT Visit: 1 Procedure OT Evaluation $OT Eval Low Complexity: 1 Procedure G-Codes: OT G-codes **NOT FOR INPATIENT CLASS** Functional Assessment Tool Used: Clinical judgement Functional Limitation: Self care Self Care Current Status (D1761): At least 1 percent but less than 20 percent impaired, limited or restricted Self Care Goal Status (Y0737): At least 1 percent but less than 20 percent impaired, limited or restricted   [name], OTR/L [phone] 12/24/2016  [name] 12/24/2016, 8:57 AM

## 2016-12-24 NOTE — Discharge Summary (Addendum)
Discharge Summary  Addendum: Patient started complaining of significant dizziness when ambulating. They checked her for orthostasis, she's found to have a systolic blood pressure lying down in the 160s and with standing at 99 with only a 13 beat increase in her heart rate.? From clonidine. We'll check echocardiogram and have stopped her clonidine. We'll cancel her discharge and observe further.   [name] WUX:[id number] DOB: [birth year]  PCP: Aretta Nip, MD  Admit date: 12/23/2016 Discharge date: 12/24/2016  Time spent: 25 minutes  Recommendations for Outpatient Follow-up:  1. Medication change: Cozaar decreased to 50 mg by mouth daily 2. Patient's being sent home with home health PT and OT 3. She'll follow-up with her PCP next one month     Discharge Diagnoses:  Active Hospital Problems   Diagnosis Date Noted  . Acute encephalopathy 12/24/2016  . Fall 12/24/2016  . Depression 12/24/2016  . Acute renal failure superimposed on stage 3 chronic kidney disease ([place]) 12/24/2016  . HTN (hypertension) 02/12/2014  . Diabetes mellitus ([place]) 02/12/2014    Resolved Hospital Problems   Diagnosis Date Noted Date Resolved  No resolved problems to display.    Discharge Condition: Improved, being discharged home   Diet recommendation: Low-sodium, carb modified   Vitals:   12/24/16 0637 12/24/16 1056  BP: (!) 163/63 121/79  Pulse: 79 79  Resp: 18   Temp: 98 F (36.7 C)     History of present illness:  Patient is an 83 year old female with past mental history of hypertension and diabetes mellitus who for the past 1 week has started having increased weakness. She describes as anytime that she would get up and walk using her walker, she just feels so weak and also like her legs were giving out from underneath her. She also felt, "cloudy in her head".  On 4/18, patient's daughter noted that she was confused and so brought her into the emergency room. She did not have any facial  droop or speech issues or one-sided weakness. In the emergency room, patient was found to have acute kidney injury with a creatinine of 1.7 (baseline near normal) and she was orthostatic. Patient's blood pressure medications were put on hold and she was started on IV fluids and brought in for further evaluation.  Hospital Course:  Principal Problem:   Acute encephalopathy: Felt to be secondary to orthostatic hypotension. Following fluid resuscitation and improvement in blood pressure, confusion resolved. Active Problems:   HTN (hypertension) : In discussion with the patient, she's been in good health with no recent illness. She is also not had any increases in her antihypertensive medications. She does report for the past few weeks that she's had not much of an appetite. She also correlates this along the time that she was started on Cymbalta for pain. Review of the literature, Cymbalta sometimes can significantly decrease appetite. I suspect that she became dehydrated and had mild drop in her blood pressure secondary to poor by mouth intake from Cymbalta which was compounded by continuing her other blood pressure medications. Patient was treated with IV fluids and was no longer orthostatic by 4/19. We'll plan discharge patient home on her regular medicines except we will decrease her Cozaar from 100 mg down to 50.    Diabetes mellitus ([place]): Overall well controlled. Mildly elevated sugars in the 200s on admission reflecting her acute illness from dehydration and acute kidney injury   Fall: Patient evaluated by PT and OT. She likely fell because she became dizzy from orthostatic  hypotension. They recommended home health PT and OT which we have started   Depression   Acute renal failure superimposed on stage 3 chronic kidney disease ([place]): Secondary to prerenal azotemia brought on by hypotension brought on by poor by mouth intake and other blood pressure medications. Improved with IV fluids and by this  morning, creatinine down to 1.20 and by time of discharge, had further improved.   Procedures:  None   Consultations:  None   Discharge Exam: BP 121/79 (BP Location: Right Arm)   Pulse 79   Temp 98 F (36.7 C) (Oral)   Resp 18   Ht 5\' 8"  (1.727 m)   Wt 104.2 kg (229 lb 11.2 oz)   SpO2 94%   BMI 34.93 kg/m   General: Alert and oriented 3, no acute distress  Cardiovascular: Regular rate and rhythm, S1-S2  Respiratory: Clear to auscultation bilaterally   Discharge Instructions You were cared for by a hospitalist during your hospital stay. If you have any questions about your discharge medications or the care you received while you were in the hospital after you are discharged, you can call the unit and asked to speak with the hospitalist on call if the hospitalist that took care of you is not available. Once you are discharged, your primary care physician will handle any further medical issues. Please note that NO REFILLS for any discharge medications will be authorized once you are discharged, as it is imperative that you return to your primary care physician (or establish a relationship with a primary care physician if you do not have one) for your aftercare needs so that they can reassess your need for medications and monitor your lab values.   Allergies as of 12/24/2016      Reactions   Ciprofloxacin Other (See Comments)   Acute kidney failure   Codeine Nausea And Vomiting   Dilaudid [hydromorphone Hcl] Nausea And Vomiting, Other (See Comments)   Severe sweating   Mysoline [primidone] Nausea Only   Oxycodone Nausea And Vomiting      Medication List    TAKE these medications   acetaminophen 500 MG tablet Commonly known as:  TYLENOL Take 1,000 mg by mouth every 6 (six) hours as needed for moderate pain.   chlorthalidone 25 MG tablet Commonly known as:  HYGROTON Take 25 mg by mouth daily.   cloNIDine 0.1 MG tablet Commonly known as:  CATAPRES Take 0.1 mg by mouth  2 (two) times daily.   DULoxetine 30 MG capsule Commonly known as:  CYMBALTA Take 1 capsule (30 mg total) by mouth 2 (two) times daily.   gabapentin 400 MG capsule Commonly known as:  NEURONTIN Take 400 mg by mouth daily.   HUMALOG MIX 75/25 KWIKPEN (75-25) 100 UNIT/ML Kwikpen Generic drug:  Insulin Lispro Prot & Lispro Inject 25-50 Units into the skin 2 (two) times daily. Inject 50 units in the am and Inject 25 in the pm.   losartan 50 MG tablet Commonly known as:  COZAAR Take 1 tablet (50 mg total) by mouth once. What changed:  medication strength  how much to take  when to take this   metFORMIN 1000 MG tablet Commonly known as:  GLUCOPHAGE Take 1,000 mg by mouth 2 (two) times daily with a meal.   propranolol 20 MG tablet Commonly known as:  INDERAL Take 2 tablets (40 mg total) by mouth 2 (two) times daily. What changed:  how much to take   TRADJENTA 5 MG Tabs tablet Generic  drug:  linagliptin Take 5 mg by mouth daily.      Allergies  Allergen Reactions  . Ciprofloxacin Other (See Comments)    Acute kidney failure  . Codeine Nausea And Vomiting  . Dilaudid [Hydromorphone Hcl] Nausea And Vomiting and Other (See Comments)    Severe sweating  . Mysoline [Primidone] Nausea Only  . Oxycodone Nausea And Vomiting      The results of significant diagnostics from this hospitalization (including imaging, microbiology, ancillary and laboratory) are listed below for reference.    Significant Diagnostic Studies: Ct Head Wo Contrast  Result Date: 12/23/2016 CLINICAL DATA:  Weakness with ambulation.  History of diabetes. EXAM: CT HEAD WITHOUT CONTRAST TECHNIQUE: Contiguous axial images were obtained from the base of the skull through the vertex without intravenous contrast. COMPARISON:  MRI brain 05/15/2016.  CT head 12/21/2015. FINDINGS: Brain: No evidence of acute infarction, hemorrhage, hydrocephalus, extra-axial collection or mass lesion/mass effect. Mild cerebral  atrophy. Patchy low-attenuation changes in the deep white matter consistent small vessel ischemia. Vascular: No hyperdense vessel or unexpected calcification. Skull: Normal. Negative for fracture or focal lesion. Sinuses/Orbits: No acute finding. Other: No significant change since previous study. IMPRESSION: No acute intracranial abnormalities. Mild chronic atrophy and small vessel ischemic changes. Electronically Signed   By: Lucienne Capers M.D.   On: 12/23/2016 23:46    Microbiology: No results found for this or any previous visit (from the past 240 hour(s)).   Labs: Basic Metabolic Panel:  Recent Labs Lab 12/23/16 2047 12/24/16 0515  NA 138 138  K 3.9 3.3*  CL 100* 104  CO2 24 23  GLUCOSE 314* 142*  BUN 36* 31*  CREATININE 1.74* 1.20*  CALCIUM 9.3 8.6*   Liver Function Tests: No results for input(s): AST, ALT, ALKPHOS, BILITOT, PROT, ALBUMIN in the last 168 hours. No results for input(s): LIPASE, AMYLASE in the last 168 hours. No results for input(s): AMMONIA in the last 168 hours. CBC:  Recent Labs Lab 12/23/16 2047 12/24/16 0515  WBC 8.3 8.5  HGB 12.5 11.6*  HCT 37.2 34.8*  MCV 84.7 84.1  PLT 215 192   Cardiac Enzymes: No results for input(s): CKTOTAL, CKMB, CKMBINDEX, TROPONINI in the last 168 hours. BNP: BNP (last 3 results) No results for input(s): BNP in the last 8760 hours.  ProBNP (last 3 results) No results for input(s): PROBNP in the last 8760 hours.  CBG:  Recent Labs Lab 12/23/16 2212 12/24/16 0316 12/24/16 0742 12/24/16 1132  GLUCAP 265* 207* 161* 166*       Signed:  Annita Brod, MD [facility] 12/24/2016, 1:35 PM

## 2016-12-25 ENCOUNTER — Observation Stay (HOSPITAL_BASED_OUTPATIENT_CLINIC_OR_DEPARTMENT_OTHER): Payer: Medicare Other

## 2016-12-25 DIAGNOSIS — G9340 Encephalopathy, unspecified: Secondary | ICD-10-CM | POA: Diagnosis not present

## 2016-12-25 DIAGNOSIS — R55 Syncope and collapse: Secondary | ICD-10-CM | POA: Diagnosis not present

## 2016-12-25 DIAGNOSIS — I10 Essential (primary) hypertension: Secondary | ICD-10-CM | POA: Diagnosis not present

## 2016-12-25 DIAGNOSIS — N179 Acute kidney failure, unspecified: Secondary | ICD-10-CM | POA: Diagnosis not present

## 2016-12-25 DIAGNOSIS — I951 Orthostatic hypotension: Secondary | ICD-10-CM | POA: Diagnosis not present

## 2016-12-25 LAB — UREA NITROGEN, URINE: Urea Nitrogen, Ur: 382 mg/dL

## 2016-12-25 LAB — BASIC METABOLIC PANEL
Anion gap: 11 (ref 5–15)
BUN: 25 mg/dL — ABNORMAL HIGH (ref 6–20)
CO2: 26 mmol/L (ref 22–32)
Calcium: 9.5 mg/dL (ref 8.9–10.3)
Chloride: 102 mmol/L (ref 101–111)
Creatinine, Ser: 1.16 mg/dL — ABNORMAL HIGH (ref 0.44–1.00)
GFR calc Af Amer: 49 mL/min — ABNORMAL LOW (ref >60)
GFR calc non Af Amer: 43 mL/min — ABNORMAL LOW (ref >60)
Glucose, Bld: 114 mg/dL — ABNORMAL HIGH (ref 65–99)
Potassium: 3.7 mmol/L (ref 3.5–5.1)
Sodium: 139 mmol/L (ref 135–145)

## 2016-12-25 LAB — ECHOCARDIOGRAM COMPLETE
Height: 68 in
Weight: 3675.2 oz

## 2016-12-25 LAB — GLUCOSE, CAPILLARY
Glucose-Capillary: 124 mg/dL — ABNORMAL HIGH (ref 65–99)
Glucose-Capillary: 182 mg/dL — ABNORMAL HIGH (ref 65–99)
Glucose-Capillary: 85 mg/dL (ref 65–99)

## 2016-12-25 MED ORDER — PERFLUTREN LIPID MICROSPHERE
1.0000 mL | INTRAVENOUS | Status: AC | PRN
  Administered 2016-12-25: 2 mL via INTRAVENOUS
  Filled 2016-12-25: qty 10

## 2016-12-25 MED ORDER — DULOXETINE HCL 30 MG PO CPEP: 30.0000 mg | ORAL_CAPSULE | Freq: Every day | ORAL | 3 refills | Status: DC

## 2016-12-25 MED ORDER — ENSURE ENLIVE PO LIQD: 237.0000 mL | ORAL | 12 refills | Status: DC

## 2016-12-25 MED ORDER — MIDODRINE HCL 2.5 MG PO TABS: 2.5000 mg | ORAL_TABLET | Freq: Two times a day (BID) | ORAL | Status: DC

## 2016-12-25 MED ORDER — MIDODRINE HCL 5 MG PO TABS
5.0000 mg | ORAL_TABLET | ORAL | Status: AC
  Administered 2016-12-25: 5 mg via ORAL
  Filled 2016-12-25: qty 1

## 2016-12-25 MED ORDER — MIDODRINE HCL 5 MG PO TABS: 5.0000 mg | ORAL_TABLET | Freq: Two times a day (BID) | ORAL | 1 refills | Status: DC | PRN

## 2016-12-25 MED ORDER — PERFLUTREN LIPID MICROSPHERE
INTRAVENOUS | Status: AC
  Filled 2016-12-25: qty 10

## 2016-12-25 NOTE — Discharge Instructions (Signed)
Wear support stockings daily  Stop clonidine.  Decrease cymbalta to 30mg  ONCE a day for ONE WEEK, then stop Cymbalta altogether.   Orthostatic Hypotension Orthostatic hypotension is a sudden drop in blood pressure that happens when you quickly change positions, such as when you get up from a seated or lying position. Blood pressure is a measurement of how strongly, or weakly, your blood is pressing against the walls of your arteries. Arteries are blood vessels that carry blood from your heart throughout your body. When blood pressure is too low, you may not get enough blood to your brain or to the rest of your organs. This can cause weakness, light-headedness, rapid heartbeat, and fainting. This can last for just a few seconds or for up to a few minutes. Orthostatic hypotension is usually not a serious problem. However, if it happens frequently or gets worse, it may be a sign of something more serious. What are the causes? This condition may be caused by:  Sudden changes in posture, such as standing up quickly after you have been sitting or lying down.  Blood loss.  Loss of body fluids (dehydration).  Heart problems.  Hormone (endocrine) problems.  Pregnancy.  Severe infection.  Lack of certain nutrients.  Severe allergic reactions (anaphylaxis).  Certain medicines, such as blood pressure medicine or medicines that make the body lose excess fluids (diuretics). Sometimes, this condition can be caused by not taking medicine as directed, such as taking too much of a certain medicine. What increases the risk? Certain factors can make you more likely to develop orthostatic hypotension, including:  Age. Risk increases as you get older.  Conditions that affect the heart or the central nervous system.  Taking certain medicines, such as blood pressure medicine or diuretics.  Being pregnant. What are the signs or symptoms? Symptoms of this condition may  include:  Weakness.  Light-headedness.  Dizziness.  Blurred vision.  Fatigue.  Rapid heartbeat.  Fainting, in severe cases. How is this diagnosed? This condition is diagnosed based on:  Your medical history.  Your symptoms.  Your blood pressure measurement. Your health care provider will check your blood pressure when you are:  Lying down.  Sitting.  Standing. A blood pressure reading is recorded as two numbers, such as "120 over 80" (or 120/80). The first ("top") number is called the systolic pressure. It is a measure of the pressure in your arteries as your heart beats. The second ("bottom") number is called the diastolic pressure. It is a measure of the pressure in your arteries when your heart relaxes between beats. Blood pressure is measured in a unit called mm Hg. Healthy blood pressure for adults is 120/80. If your blood pressure is below 90/60, you may be diagnosed with hypotension. Other information or tests that may be used to diagnose orthostatic hypotension include:  Your other vital signs, such as your heart rate and temperature.  Blood tests.  Tilt table test. For this test, you will be safely secured to a table that moves you from a lying position to an upright position. Your heart rhythm and blood pressure will be monitored during the test. How is this treated? Treatment for this condition may include:  Changing your diet. This may involve eating more salt (sodium) or drinking more water.  Taking medicines to raise your blood pressure.  Changing the dosage of certain medicines you are taking that might be lowering your blood pressure.  Wearing compression stockings. These stockings help to prevent blood clots and  reduce swelling in your legs. In some cases, you may need to go to the hospital for:  Fluid replacement. This means you will receive fluids through an IV tube.  Blood replacement. This means you will receive donated blood through an IV tube  (transfusion).  Treating an infection or heart problems, if this applies.  Monitoring. You may need to be monitored while medicines that you are taking wear off. Follow these instructions at home: Eating and drinking    Drink enough fluid to keep your urine clear or pale yellow.  Eat a healthy diet and follow instructions from your health care provider about eating or drinking restrictions. A healthy diet includes:  Fresh fruits and vegetables.  Whole grains.  Lean meats.  Low-fat dairy products.  Eat extra salt only as directed. Do not add extra salt to your diet unless your health care provider told you to do that.  Eat frequent, small meals.  Avoid standing up suddenly after eating. Medicines   Take over-the-counter and prescription medicines only as told by your health care provider.  Follow instructions from your health care provider about changing the dosage of your current medicines, if this applies.  Do not stop or adjust any of your medicines on your own. General instructions   Wear compression stockings as told by your health care provider.  Get up slowly from lying down or sitting positions. This gives your blood pressure a chance to adjust.  Avoid hot showers and excessive heat as directed by your health care provider.  Return to your normal activities as told by your health care provider. Ask your health care provider what activities are safe for you.  Do not use any products that contain nicotine or tobacco, such as cigarettes and e-cigarettes. If you need help quitting, ask your health care provider.  Keep all follow-up visits as told by your health care provider. This is important. Contact a health care provider if:  You vomit.  You have diarrhea.  You have a fever for more than 2-3 days.  You feel more thirsty than usual.  You feel weak and tired. Get help right away if:  You have chest pain.  You have a fast or irregular heartbeat.  You  develop numbness in any part of your body.  You cannot move your arms or your legs.  You have trouble speaking.  You become sweaty or feel lightheaded.  You faint.  You feel short of breath.  You have trouble staying awake.  You feel confused. This information is not intended to replace advice given to you by your health care provider. Make sure you discuss any questions you have with your health care provider. Document Released: 08/14/2002 Document Revised: 05/12/2016 Document Reviewed: 02/14/2016 Elsevier Interactive Patient Education  2017 [employer].

## 2016-12-25 NOTE — Consult Note (Signed)
CARDIOLOGY CONSULT NOTE  Patient ID: [name]  MRN: [id number]   DOB/AGE: October 05, 1932 83 y.o.  Admit date: 12/23/2016 Referring Physician  Dr. Gevena Barre Primary Physician:  Adrian Prows, MD Reason for Consultation  Orthostatic Hypotension  HPI: [name]  is a 83 y.o. female  With past medical history significant of hypertension, hyperlipidemia, diabetes, GERD, depression, essential tremor, chronic back pain, chronic kidney disease-stage III, who presented to the Emergency department with altered mental status, status post fall, and bilateral leg weakness.  Patient states that she was at Orthopedic office on Tuesday for follow up and while walking into the office she suddenly fell to the floor. Denies any loss of consciousness. Denies any previous syncopal episodes. She states that ever since this occurrence she has had bilateral leg weakness. As her symptoms persisted and she was noted to have altered mental status by her daughter, she was brought to the ED for further evaluation.   In the ED, findings for TIA or CVA were excluded by CT of head that was negative. She was found to have orthostatic blood pressure. No evidence of infection by labs or urinalysis. She did have noted worsening renal function. She was admitted to telemetry for observation.   We had started her on Midodrine therapy for her orthostasis and she now states that she feels better and feels ready to go home. Denies any dizziness or leg weakness.   Past Medical History:  Diagnosis Date  . Anemia    as a teenager  . Arthritis   . Cancer ([place])    skin  . Cholelithiasis   . Chronic insomnia   . Chronic low back pain   . Complication of anesthesia   . DDD (degenerative disc disease), lumbar   . Diabetes mellitus    Iddm x 12 years  . Diabetic peripheral neuropathy ([place])   . Essential tremor 04/01/2015  . GERD (gastroesophageal reflux disease)   . Hx of acute renal failure 02/2014   admitted to [facility]   . Hx: UTI (urinary tract infection) 02/2014  . Hyperlipemia   . Hypertension   . Obesity   . Peripheral edema   . Pneumonia    as a child  . PONV (postoperative nausea and vomiting)    pt states Zofran not that helpful in the past, Phenergan works best for her  . Staph infection    1970's  . Umbilical hernia   . Wrist fracture    left October 2014     Past Surgical History:  Procedure Laterality Date  . ABDOMINAL HYSTERECTOMY    . BACK SURGERY  2005  . CARDIAC CATHETERIZATION  2000   Dr Einar Gip  . CATARACT EXTRACTION Bilateral    w/ lens implants  . COLONOSCOPY  >10 years  . EYE SURGERY    . HERNIA REPAIR    . LUMBAR LAMINECTOMY/DECOMPRESSION MICRODISCECTOMY Left 03/16/2014   Procedure: LUMBAR LAMINECTOMY/DECOMPRESSION MICRODISCECTOMY 1 LEVEL  lumbar level three/four;  Surgeon: [name], MD;  Location: [place];  Service: Neurosurgery;  Laterality: Left;  . LUMBAR LAMINECTOMY/DECOMPRESSION MICRODISCECTOMY Right 02/06/2015   Procedure: Right L4-5 Microdiskectomy;  Surgeon: Leeroy Cha, MD;  Location: [place] [facility];  Service: Neurosurgery;  Laterality: Right;  Right L4-5 Microdiskectomy  . NECK SURGERY    . OOPHORECTOMY    . TOTAL KNEE ARTHROPLASTY Right      Family History  Problem Relation Age of Onset  . Cancer Brother     prostate  .  Heart disease Brother   . Alzheimer's disease Brother   . Stroke Mother   . Hypertension Mother   . COPD Brother   . Cancer Brother     Prostate cancer  . Hypertension Sister     Congestive heart failure  . Heart attack Son      Social History: Social History   Social History  . Marital status: Widowed    Spouse name: N/A  . Number of children: 3  . Years of education: 69   Occupational History  . Retired     Social History Main Topics  . Smoking status: Never Smoker  . Smokeless tobacco: Never Used  . Alcohol use No  . Drug use: No  . Sexual activity: No   Other Topics Concern  . Not on file   Social History  Narrative   Patient lives at home alone.    Patient is widowed.    Patient has 3 children and 2 step children.    Patient is right handed.    Patient has a high school education and CNA certificate.     Patient is retired.    Patient does not drink caffeine.     Prescriptions Prior to Admission  Medication Sig Dispense Refill Last Dose  . chlorthalidone (HYGROTON) 25 MG tablet Take 25 mg by mouth daily.    12/23/2016 at Unknown time  . cloNIDine (CATAPRES) 0.1 MG tablet Take 0.1 mg by mouth 2 (two) times daily.     12/23/2016 at 1100  . gabapentin (NEURONTIN) 400 MG capsule Take 400 mg by mouth daily.    12/23/2016 at Unknown time  . HUMALOG MIX 75/25 KWIKPEN (75-25) 100 UNIT/ML Kwikpen Inject 25-50 Units into the skin 2 (two) times daily. Inject 50 units in the am and Inject 25 in the pm.   12/23/2016 at Unknown time  . losartan (COZAAR) 100 MG tablet Take 100 mg by mouth daily.   12/23/2016 at Unknown time  . metFORMIN (GLUCOPHAGE) 1000 MG tablet Take 1,000 mg by mouth 2 (two) times daily with a meal.    12/23/2016 at Unknown time  . propranolol (INDERAL) 20 MG tablet Take 2 tablets (40 mg total) by mouth 2 (two) times daily. (Patient taking differently: Take 20 mg by mouth 2 (two) times daily. ) 360 tablet 3 12/23/2016 at 1100  . TRADJENTA 5 MG TABS tablet Take 5 mg by mouth daily.   12/23/2016 at Unknown time  . [DISCONTINUED] DULoxetine (CYMBALTA) 30 MG capsule Take 1 capsule (30 mg total) by mouth 2 (two) times daily. 60 capsule 3 12/23/2016 at Unknown time  . acetaminophen (TYLENOL) 500 MG tablet Take 1,000 mg by mouth every 6 (six) hours as needed for moderate pain.   unknown  . [DISCONTINUED] diclofenac sodium (VOLTAREN) 1 % GEL Apply 4 g topically 4 (four) times daily. (Patient not taking: Reported on 12/23/2016) 100 g 0 Completed Course at Unknown time  . [DISCONTINUED] promethazine (PHENERGAN) 25 MG tablet Take 1 tablet (25 mg total) by mouth every 6 (six) hours as needed for nausea or  vomiting. (Patient not taking: Reported on 12/23/2016) 20 tablet 0 Completed Course at Unknown time  . [DISCONTINUED] traMADol (ULTRAM) 50 MG tablet Take 1 tablet (50 mg total) by mouth every 6 (six) hours as needed. (Patient not taking: Reported on 12/23/2016) 10 tablet 0 Completed Course at Unknown time     ROS: General: Patient is feeling well does have occasional dizziness. No recent falls. Denies any syncopal  episodes. She does report intentional weight loss, but otherwise weight has been stable. States diabetes is uncontrolled. Chest: Denies any chest pain, shortness of breath, cough. Abdomen: No dark tarry stools. She does report recent constipation ;however, normally has frequent diarrhea.denies any belly pain nausea and vomiting. Extremities: Patient does have arthritis, denies any swelling.    Physical Exam: Blood pressure 126/61, pulse 78, temperature 98.1 F (36.7 C), temperature source Oral, resp. rate 18, height 5\' 8"  (1.727 m), weight 104.2 kg (229 lb 11.2 oz), SpO2 93 %.   General appearance: alert, cooperative, appears stated age, no distress and moderately obese Lungs: clear to auscultation bilaterally Heart: regular rate and rhythm, S1, S2 normal, no murmur, click, rub or gallop Abdomen: soft, non-tender; bowel sounds normal; no masses,  no organomegaly and large pannus present Extremities: extremities normal, atraumatic, no cyanosis or edema and large adipose tissue, no edema Pulses: 2+ and symmetric Neurologic: Grossly normal  Labs:   Lab Results  Component Value Date   WBC 8.5 12/24/2016   HGB 11.6 (L) 12/24/2016   HCT 34.8 (L) 12/24/2016   MCV 84.1 12/24/2016   PLT 192 12/24/2016    Recent Labs Lab 12/25/16 0457  NA 139  K 3.7  CL 102  CO2 26  BUN 25*  CREATININE 1.16*  CALCIUM 9.5  GLUCOSE 114*    Lipid Panel     Component Value Date/Time   CHOL 152 05/16/2016 0508   TRIG 283 (H) 05/16/2016 0508   HDL 38 (L) 05/16/2016 0508   CHOLHDL 4.0  05/16/2016 0508   VLDL 57 (H) 05/16/2016 0508   LDLCALC 57 05/16/2016 0508    BNP (last 3 results) No results for input(s): BNP in the last 8760 hours.  HEMOGLOBIN A1C Lab Results  Component Value Date   HGBA1C 7.8 (H) 05/15/2016   MPG 177 05/15/2016    Cardiac Panel (last 3 results)  Recent Labs  05/15/16 2325 05/16/16 0508 05/16/16 0937  TROPONINI <0.03 <0.03 <0.03    Lab Results  Component Value Date   CKTOTAL 194 (H) 02/13/2014   CKMB 1.6 05/02/2011   TROPONINI <0.03 05/16/2016     TSH  Recent Labs  05/16/16 0508  TSH 5.394*    EKG: normal EKG, normal sinus rhythm, unchanged from previous tracings.  Echo: Left ventricle: The cavity size was normal. Wall thickness was   normal. Systolic function was normal. The estimated ejection   fraction was in the range of 60% to 65%. Wall motion was normal;   there were no regional wall motion abnormalities. Doppler   parameters are consistent with abnormal left ventricular   relaxation (grade 1 diastolic dysfunction).Pulmonary arteries: Systolic pressure was mildly increased. PA   peak pressure: 37 mm Hg (S).   Radiology: Ct Head Wo Contrast  Result Date: 12/23/2016 CLINICAL DATA:  Weakness with ambulation.  History of diabetes. EXAM: CT HEAD WITHOUT CONTRAST TECHNIQUE: Contiguous axial images were obtained from the base of the skull through the vertex without intravenous contrast. COMPARISON:  MRI brain 05/15/2016.  CT head 12/21/2015. FINDINGS: Brain: No evidence of acute infarction, hemorrhage, hydrocephalus, extra-axial collection or mass lesion/mass effect. Mild cerebral atrophy. Patchy low-attenuation changes in the deep white matter consistent small vessel ischemia. Vascular: No hyperdense vessel or unexpected calcification. Skull: Normal. Negative for fracture or focal lesion. Sinuses/Orbits: No acute finding. Other: No significant change since previous study. IMPRESSION: No acute intracranial abnormalities. Mild  chronic atrophy and small vessel ischemic changes. Electronically Signed   By: Gwyndolyn Saxon  Gerilyn Nestle M.D.   On: 12/23/2016 23:46    Scheduled Meds: . enoxaparin (LOVENOX) injection  40 mg Subcutaneous Q24H  . feeding supplement (ENSURE ENLIVE)  237 mL Oral Q24H  . gabapentin  400 mg Oral Daily  . insulin aspart  0-9 Units Subcutaneous TID WC  . insulin aspart protamine- aspart  25 Units Subcutaneous q1800  . insulin aspart protamine- aspart  50 Units Subcutaneous Q breakfast  . losartan  100 mg Oral Daily  . propranolol  40 mg Oral BID  . sodium chloride flush  3 mL Intravenous Q12H   Continuous Infusions: PRN Meds:.acetaminophen, hydrALAZINE, ondansetron **OR** ondansetron (ZOFRAN) IV, zolpidem  ASSESSMENT AND PLAN:   #1 dizziness and near syncope secondary to orthostatic hypotension.  #2 diabetes with autonomic neuropathy  #3 supine hypertension/orthostatic hypotension #4 nephropathy stage III secondary to diabetes and hypertension  Recommendations: Lengthy discussion with patient regarding lifestyle modifications with orthostatic hypotension and finding balance regarding supine hypertension.Discussed support stockings, sleeping upright, and take Midodrine while awake. Advised on fall precautions and discussed with patients daughter at bedside. From Cardiology standpoint, she is table for discharge. We'll see her back in 2-3 weeks in the office for follow-up.   Miquel Dunn, FNP-C 12/25/2016, 5:54 PM [facility]. [place] Pager: [phone] Office: [phone] If no answer Cell [phone]

## 2016-12-25 NOTE — Progress Notes (Signed)
Physical Therapy Treatment Patient Details Name: [name] MRN: [id number] DOB: [birth year] Today's Date: 12/25/2016    History of Present Illness Pt was admitted with acute encephalopathy.  She has a h/o HTN, DM, chronic back pain, essential tremor.      PT Comments    Pt able to ambulate into hallway today however still required seated rest break.  Follow Up Recommendations  Home health PT;Supervision/Assistance - 24 hour     Equipment Recommendations  None recommended by PT    Recommendations for Other Services       Precautions / Restrictions Precautions Precautions: Fall Precaution Comments: orthostatic    Mobility  Bed Mobility Overal bed mobility: Modified Independent             General bed mobility comments: extra time, HOB raised  Transfers Overall transfer level: Needs assistance Equipment used: Rolling walker (2 wheeled) Transfers: Sit to/from Stand Sit to Stand: Min guard         General transfer comment: min/guard for safety  Ambulation/Gait Ambulation/Gait assistance: Min guard Ambulation Distance (Feet): 20 Feet Assistive device: Rolling walker (2 wheeled) Gait Pattern/deviations: Step-through pattern;Decreased stride length     General Gait Details: min/guard for safety and recliner following, pt required seated rest break after 20 feet due to "woozy" feeling, BP 153/80 mmHg HR 76 upon sitting, pt attempted to stand again and needed to sit back down; able to ambulate 44 feet after rest break back to room   Stairs            Wheelchair Mobility    Modified Rankin (Stroke Patients Only)       Balance                                            Cognition Arousal/Alertness: Awake/alert Behavior During Therapy: WFL for tasks assessed/performed                                          Exercises      General Comments        Pertinent Vitals/Pain Pain Assessment: No/denies pain     Home Living                      Prior Function            PT Goals (current goals can now be found in the care plan section) Progress towards PT goals: Progressing toward goals    Frequency    Min 3X/week      PT Plan Current plan remains appropriate    Co-evaluation             End of Session Equipment Utilized During Treatment: Gait belt Activity Tolerance: Patient tolerated treatment well Patient left: in chair;with call bell/phone within reach (NT to place chair alarm) Nurse Communication: Mobility status PT Visit Diagnosis: Unsteadiness on feet (R26.81)     Time: 0865-7846 PT Time Calculation (min) (ACUTE ONLY): 19 min  Charges:  $Gait Training: 8-22 mins                    G Codes:       Carmelia Bake, PT, DPT 12/25/2016 Pager: [phone]    York Ram E 12/25/2016, 1:12 PM

## 2016-12-25 NOTE — Care Management Obs Status (Signed)
[facility] NOTIFICATION   Patient Details  Name: [name] MRN: [id number] Date of Birth: 12/26/1932   Medicare Observation Status Notification Given:  Yes    Purcell Mouton, RN 12/25/2016, 4:46 PM

## 2016-12-25 NOTE — Discharge Summary (Signed)
Discharge Summary     [name]:[id number] DOB: July 15, 1933  PCP: Aretta Nip, MD  Admit date: 12/23/2016 Discharge date: 12/25/2016  Time spent: 25 minutes  Recommendations for Outpatient Follow-up:  1. Medication change: Cymbalta decreased from 30 mg twice a day to 30 mg daily for one week, then discontinue altogether. 2. Medication change: Clonidine discontinued. 3. New medication: Midodrine 5 mg by mouth twice a day when necessary 4. She'll follow-up with her PCP next one month     Discharge Diagnoses:  Active Hospital Problems   Diagnosis Date Noted  . Acute encephalopathy 12/24/2016  . Fall 12/24/2016  . Depression 12/24/2016  . Acute renal failure superimposed on stage 3 chronic kidney disease ([place]) 12/24/2016  . HTN (hypertension) 02/12/2014  . Diabetes mellitus ([place]) 02/12/2014    Resolved Hospital Problems   Diagnosis Date Noted Date Resolved  No resolved problems to display.    Discharge Condition: Improved, being discharged home   Diet recommendation: Low-sodium, carb modified -With ensure feeding supplement 1 daily  Vitals:   12/25/16 1131 12/25/16 1337  BP: (!) 156/63 126/61  Pulse: 68 78  Resp:  18  Temp:  98.1 F (36.7 C)    History of present illness:  Patient is an 83 year old female with past mental history of hypertension and diabetes mellitus who for the past 1 week has started having increased weakness. She describes as anytime that she would get up and walk using her walker, she just feels so weak and also like her legs were giving out from underneath her. She also felt, "cloudy in her head".  On 4/18, patient's daughter noted that she was confused and so brought her into the emergency room. She did not have any facial droop or speech issues or one-sided weakness. In the emergency room, patient was found to have acute kidney injury with a creatinine of 1.7 (baseline near normal) and she was orthostatic. Patient's blood pressure  medications were put on hold and she was started on IV fluids and brought in for further evaluation.  Hospital Course:  Principal Problem:   Acute encephalopathy: Felt to be secondary to orthostatic hypotension. Following fluid resuscitation and improvement in blood pressure, confusion resolved. Active Problems:   HTN (hypertension) with orthostatic hypotension: In discussion with the patient, she's been in good health with no recent illness. She is also not had any increases in her antihypertensive medications. She does report for the past few weeks that she's had not much of an appetite. She also correlates this along the time that she was started on Cymbalta for pain. Review of the literature, Cymbalta sometimes can significantly decrease appetite and some instances can cause orthostatic hypotension. I suspect that she became dehydrated and had mild drop in her blood pressure secondary to poor by mouth intake from Cymbalta which was also further compounded by clonidine which was causing some decreased heart rate along with her propranolol. Patient was treated with IV fluids but still remained orthostatic. Echocardiogram checked noting only grade 1 diastolic dysfunction, but otherwise unrevealing. Spoke with her cardiologist, Dr.Ganji, who recommended starting her on Midodrine when necessary, DC her clonidine and tapering off her Cymbalta.     Diabetes mellitus ([place]): Overall well controlled. Mildly elevated sugars in the 200s on admission reflecting her acute illness from dehydration and acute kidney injury   Fall: Patient evaluated by PT and OT. She likely fell because she became dizzy from orthostatic hypotension. They recommended home health PT and OT which we  have started   Depression   Acute renal failure superimposed on stage 3 chronic kidney disease ([place]): Secondary to prerenal azotemia brought on by hypotension brought on by poor by mouth intake and other blood pressure medications. Improved  with IV fluids andby day of discharge, down to 1.16  Procedures: echocardiogram done 1/01: Grade 1 diastolic dysfunction   Consultations:  Einar Gip- cardiology   Discharge Exam: BP 126/61 (BP Location: Left Arm)   Pulse 78   Temp 98.1 F (36.7 C) (Oral)   Resp 18   Ht 5\' 8"  (1.727 m)   Wt 104.2 kg (229 lb 11.2 oz)   SpO2 93%   BMI 34.93 kg/m   General: Alert and oriented 3, no acute distress  Cardiovascular: Regular rate and rhythm, S1-S2  Respiratory: Clear to auscultation bilaterally   Discharge Instructions You were cared for by a hospitalist during your hospital stay. If you have any questions about your discharge medications or the care you received while you were in the hospital after you are discharged, you can call the unit and asked to speak with the hospitalist on call if the hospitalist that took care of you is not available. Once you are discharged, your primary care physician will handle any further medical issues. Please note that NO REFILLS for any discharge medications will be authorized once you are discharged, as it is imperative that you return to your primary care physician (or establish a relationship with a primary care physician if you do not have one) for your aftercare needs so that they can reassess your need for medications and monitor your lab values.    Allergies  Allergen Reactions  . Ciprofloxacin Other (See Comments)    Acute kidney failure  . Codeine Nausea And Vomiting  . Dilaudid [Hydromorphone Hcl] Nausea And Vomiting and Other (See Comments)    Severe sweating  . Mysoline [Primidone] Nausea Only  . Oxycodone Nausea And Vomiting      The results of significant diagnostics from this hospitalization (including imaging, microbiology, ancillary and laboratory) are listed below for reference.    Significant Diagnostic Studies: Ct Head Wo Contrast  Result Date: 12/23/2016 CLINICAL DATA:  Weakness with ambulation.  History of diabetes. EXAM:  CT HEAD WITHOUT CONTRAST TECHNIQUE: Contiguous axial images were obtained from the base of the skull through the vertex without intravenous contrast. COMPARISON:  MRI brain 05/15/2016.  CT head 12/21/2015. FINDINGS: Brain: No evidence of acute infarction, hemorrhage, hydrocephalus, extra-axial collection or mass lesion/mass effect. Mild cerebral atrophy. Patchy low-attenuation changes in the deep white matter consistent small vessel ischemia. Vascular: No hyperdense vessel or unexpected calcification. Skull: Normal. Negative for fracture or focal lesion. Sinuses/Orbits: No acute finding. Other: No significant change since previous study. IMPRESSION: No acute intracranial abnormalities. Mild chronic atrophy and small vessel ischemic changes. Electronically Signed   By: Lucienne Capers M.D.   On: 12/23/2016 23:46    Microbiology: No results found for this or any previous visit (from the past 240 hour(s)).   Labs: Basic Metabolic Panel:  Recent Labs Lab 12/23/16 2047 12/24/16 0515 12/25/16 0457  NA 138 138 139  K 3.9 3.3* 3.7  CL 100* 104 102  CO2 24 23 26   GLUCOSE 314* 142* 114*  BUN 36* 31* 25*  CREATININE 1.74* 1.20* 1.16*  CALCIUM 9.3 8.6* 9.5   Liver Function Tests: No results for input(s): AST, ALT, ALKPHOS, BILITOT, PROT, ALBUMIN in the last 168 hours. No results for input(s): LIPASE, AMYLASE in the last 168  hours. No results for input(s): AMMONIA in the last 168 hours. CBC:  Recent Labs Lab 12/23/16 2047 12/24/16 0515  WBC 8.3 8.5  HGB 12.5 11.6*  HCT 37.2 34.8*  MCV 84.7 84.1  PLT 215 192   Cardiac Enzymes: No results for input(s): CKTOTAL, CKMB, CKMBINDEX, TROPONINI in the last 168 hours. BNP: BNP (last 3 results) No results for input(s): BNP in the last 8760 hours.  ProBNP (last 3 results) No results for input(s): PROBNP in the last 8760 hours.  CBG:  Recent Labs Lab 12/24/16 1132 12/24/16 1644 12/24/16 2110 12/25/16 0732 12/25/16 1206  GLUCAP 166*  111* 179* 124* 182*       Signed:  Annita Brod, MD [facility] 12/25/2016, 5:18 PM

## 2016-12-25 NOTE — Progress Notes (Signed)
Occupational Therapy Treatment Patient Details Name: [name] MRN: [id number] DOB: [birth year] Today's Date: 12/25/2016    History of present illness Pt was admitted with acute encephalopathy.  She has a h/o HTN, DM, chronic back pain, essential tremor.     OT comments  Limited tx as pt very tired this afternoon.  Tremor more pronounced today.  Pt did not feel up to ADL today  Follow Up Recommendations  Home health OT;Supervision/Assistance - 24 hour    Equipment Recommendations  None recommended by OT    Recommendations for Other Services      Precautions / Restrictions Precautions Precautions: Fall Precaution Comments: orthostatic Restrictions Weight Bearing Restrictions: No       Mobility Bed Mobility Overal bed mobility: Modified Independent             General bed mobility comments: able to scoot up in bed with bed rails  Transfers Overall transfer level: Needs assistance Equipment used: Rolling walker (2 wheeled) Transfers: Sit to/from Stand Sit to Stand: Min guard         General transfer comment: for safety    Balance                                           ADL either performed or assessed with clinical judgement   ADL                           Toilet Transfer: Min guard;Stand-pivot;RW (bed)             General ADL Comments: Pt stated she was very tired and wanted to get back to bed.  She only performed SPT to bed: did not feel like walking around bed nor using BSC prior to getting back in.  brought handout about tub bench as an option. when she saw this, she said that she does in fact have a tub bench instead of a tub seat.       Vision       Perception     Praxis      Cognition Arousal/Alertness: Awake/alert Behavior During Therapy: WFL for tasks assessed/performed Overall Cognitive Status: Within Functional Limits for tasks assessed                                           Exercises     Shoulder Instructions       General Comments      Pertinent Vitals/ Pain       Pain Assessment: No/denies pain  Home Living                                          Prior Functioning/Environment              Frequency  Min 2X/week        Progress Toward Goals  OT Goals(current goals can now be found in the care plan section)  Progress towards OT goals: Progressing toward goals  ADL Goals Additional ADL Goal #2: (P) discontinue  Plan      Co-evaluation  End of Session        Activity Tolerance Patient limited by fatigue   Patient Left in bed;with call bell/phone within reach;with bed alarm set   Nurse Communication          Time: [phone] OT Time Calculation (min): 10 min  Charges: OT General Charges $OT Visit: 1 Procedure OT Treatments $Therapeutic Activity: 8-22 mins  [name], OTR/L [phone] 12/25/2016   [name] 12/25/2016, 2:18 PM

## 2016-12-25 NOTE — Progress Notes (Signed)
  Echocardiogram 2D Echocardiogram with Definity has been performed.  [name] 12/25/2016, 12:45 PM

## 2016-12-28 ENCOUNTER — Encounter: Payer: Self-pay | Admitting: Neurology

## 2016-12-28 ENCOUNTER — Ambulatory Visit (INDEPENDENT_AMBULATORY_CARE_PROVIDER_SITE_OTHER): Payer: Medicare Other | Admitting: Neurology

## 2016-12-28 VITALS — BP 112/64 | HR 85 | Ht 68.0 in

## 2016-12-28 DIAGNOSIS — G8929 Other chronic pain: Secondary | ICD-10-CM | POA: Diagnosis not present

## 2016-12-28 DIAGNOSIS — M545 Low back pain, unspecified: Secondary | ICD-10-CM

## 2016-12-28 DIAGNOSIS — G250 Essential tremor: Secondary | ICD-10-CM

## 2016-12-28 NOTE — Progress Notes (Signed)
Reason for visit: Tremor  [name] is an 83 y.o. female  History of present illness:  [name] is a 83 year old right-handed white female with a history of an essential tremor affecting both arms also associated with a mild vocal tremor. The patient has been on propranolol 20 mg twice daily, she was increased to 40 mg twice daily, but she never actually went up on the higher dose. The patient was given Cymbalta for low back pain which was very effective even at the 30 mg dose. Shortly after starting this medication, the patient began having problems with walking. She has had confusion and weakness of both legs with standing. This seems to improve with sitting down. The patient was admitted to the hospital on 12/23/2016. The patient was found to be orthostatic, she was given IV fluids. The patient was told to taper off of the Cymbalta. The patient is also been on chlorthalidone as a diuretic. The patient was placed on midodrine and she was taken off of Cozaar and clonidine. The patient continues to have difficulty with walking, she gets confused when she stands up. The tremors have worsened somewhat over time. A CT scan of the brain was done in the hospital, 2-D echocardiogram was done, the studies were unremarkable.  Past Medical History:  Diagnosis Date  . Anemia    as a teenager  . Arthritis   . Cancer ([place])    skin  . Cholelithiasis   . Chronic insomnia   . Chronic low back pain   . Complication of anesthesia   . DDD (degenerative disc disease), lumbar   . Diabetes mellitus    Iddm x 12 years  . Diabetic peripheral neuropathy ([place])   . Essential tremor 04/01/2015  . GERD (gastroesophageal reflux disease)   . Hx of acute renal failure 02/2014   admitted to [facility]  . Hx: UTI (urinary tract infection) 02/2014  . Hyperlipemia   . Hypertension   . Obesity   . Peripheral edema   . Pneumonia    as a child  . PONV (postoperative nausea and vomiting)    pt states Zofran  not that helpful in the past, Phenergan works best for her  . Staph infection    1970's  . Umbilical hernia   . Wrist fracture    left October 2014    Past Surgical History:  Procedure Laterality Date  . ABDOMINAL HYSTERECTOMY    . BACK SURGERY  2005  . CARDIAC CATHETERIZATION  2000   Dr Einar Gip  . CATARACT EXTRACTION Bilateral    w/ lens implants  . COLONOSCOPY  >10 years  . EYE SURGERY    . HERNIA REPAIR    . LUMBAR LAMINECTOMY/DECOMPRESSION MICRODISCECTOMY Left 03/16/2014   Procedure: LUMBAR LAMINECTOMY/DECOMPRESSION MICRODISCECTOMY 1 LEVEL  lumbar level three/four;  Surgeon: [name], MD;  Location: [place];  Service: Neurosurgery;  Laterality: Left;  . LUMBAR LAMINECTOMY/DECOMPRESSION MICRODISCECTOMY Right 02/06/2015   Procedure: Right L4-5 Microdiskectomy;  Surgeon: Leeroy Cha, MD;  Location: [place] [facility];  Service: Neurosurgery;  Laterality: Right;  Right L4-5 Microdiskectomy  . NECK SURGERY    . OOPHORECTOMY    . TOTAL KNEE ARTHROPLASTY Right     Family History  Problem Relation Age of Onset  . Cancer Brother     prostate  . Heart disease Brother   . Alzheimer's disease Brother   . Stroke Mother   . Hypertension Mother   . COPD Brother   .  Cancer Brother     Prostate cancer  . Hypertension Sister     Congestive heart failure  . Heart attack Son     Social history:  reports that she has never smoked. She has never used smokeless tobacco. She reports that she does not drink alcohol or use drugs.    Allergies  Allergen Reactions  . Ciprofloxacin Other (See Comments)    Acute kidney failure  . Codeine Nausea And Vomiting  . Dilaudid [Hydromorphone Hcl] Nausea And Vomiting and Other (See Comments)    Severe sweating  . Mysoline [Primidone] Nausea Only  . Oxycodone Nausea And Vomiting    Medications:  Prior to Admission medications   Medication Sig Start Date End Date Taking? Authorizing Provider  acetaminophen (TYLENOL) 500 MG tablet Take 1,000 mg  by mouth every 6 (six) hours as needed for moderate pain.   Yes Historical Provider, MD  chlorthalidone (HYGROTON) 25 MG tablet Take 25 mg by mouth daily.  01/23/14  Yes Historical Provider, MD  DULoxetine (CYMBALTA) 30 MG capsule Take 1 capsule (30 mg total) by mouth daily. 12/25/16  Yes Annita Brod, MD  feeding supplement, ENSURE ENLIVE, (ENSURE ENLIVE) LIQD Take 237 mLs by mouth daily. 12/26/16  Yes Annita Brod, MD  gabapentin (NEURONTIN) 400 MG capsule Take 400 mg by mouth daily.    Yes Historical Provider, MD  HUMALOG MIX 75/25 KWIKPEN (75-25) 100 UNIT/ML Kwikpen Inject 25-50 Units into the skin 2 (two) times daily. Inject 50 units in the am and Inject 25 in the pm. 06/04/15  Yes Historical Provider, MD  losartan (COZAAR) 100 MG tablet Take 100 mg by mouth daily.   Yes Historical Provider, MD  metFORMIN (GLUCOPHAGE) 1000 MG tablet Take 1,000 mg by mouth 2 (two) times daily with a meal.    Yes Historical Provider, MD  midodrine (PROAMATINE) 5 MG tablet Take 1 tablet (5 mg total) by mouth 2 (two) times daily as needed. 12/25/16  Yes Annita Brod, MD  propranolol (INDERAL) 20 MG tablet Take 2 tablets (40 mg total) by mouth 2 (two) times daily. Patient taking differently: Take 20 mg by mouth 2 (two) times daily.  12/01/16  Yes Kathrynn Ducking, MD  TRADJENTA 5 MG TABS tablet Take 5 mg by mouth daily. 07/24/13  Yes Historical Provider, MD    ROS:  Out of a complete 14 system review of symptoms, the patient complains only of the following symptoms, and all other reviewed systems are negative.  Loss of vision Walking difficulty Tremors  Blood pressure 112/64, pulse 85, height 5\' 8"  (1.727 m), SpO2 97 %.   Blood pressure, sitting, right arm is 132/62. Blood pressure, right arm, standing is 72/42.  Physical Exam  General: The patient is alert and cooperative at the time of the examination.  Skin: No significant peripheral edema is noted.   Neurologic Exam  Mental status: The  patient is alert and oriented x 3 at the time of the examination. The patient has apparent normal recent and remote memory, with an apparently normal attention span and concentration ability.   Cranial nerves: Facial symmetry is present. Speech is normal, no aphasia or dysarthria is noted. Extraocular movements are full. Visual fields are full.  Motor: The patient has good strength in all 4 extremities.  Sensory examination: Soft touch sensation is symmetric on the face, arms, and legs.  Coordination: The patient has good finger-nose-finger and heel-to-shin bilaterally. Tremors are noted with both upper extremities.  Gait and  station: The patient has a slightly wide-based gait, the patient can walk a short distance. Tandem gait was not attempted. No drift is seen.  Reflexes: Deep tendon reflexes are symmetric.    CT brain 12/23/16:  IMPRESSION: No acute intracranial abnormalities. Mild chronic atrophy and small vessel ischemic changes.  * CT scan images were reviewed online. I agree with the written report.    Assessment/Plan:  1. Essential tremor  2. Orthostatic hypotension  The patient has had a fairly dramatic change in her blood pressures with orthostasis that developed over the last several weeks. She is dropping 60 mm hg from sitting to standing in the office today. The patient is not able to walk well because of this. The patient was recently placed on Cymbalta, orthostatic hypotension is a potential side effect of this medication. She will stop the Cymbalta, stop the propranolol, and stop the chlorthalidone. She will encourage fluid intake. She is wearing compression stockings, she is also on midodrine. She will follow-up in 6 weeks. She is not to walk without assistance.  Jill Alexanders MD 12/28/2016 3:40 PM  [facility] [address] [place], [place] [address]  Phone [phone] Fax [phone]

## 2016-12-28 NOTE — Patient Instructions (Signed)
  We will stop the propranolol, Cymbalta and the chlorthaladone. Follow the blood pressures at home and drink plenty of fluids.  Continue the midodrine for now.

## 2016-12-29 NOTE — Progress Notes (Signed)
   12/25/16 1400  OT Time Calculation  OT Start Time (ACUTE ONLY) 1349  OT Stop Time (ACUTE ONLY) 1359  OT Time Calculation (min) 10 min  OT G-codes **NOT FOR INPATIENT CLASS**  Self Care Discharge Status (K4469) CI  OT General Charges  $OT Visit 1 Procedure  OT Treatments  $Therapeutic Activity 8-22 mins  [place], OTR/L [phone] 12/29/2016

## 2017-01-04 ENCOUNTER — Encounter (HOSPITAL_COMMUNITY): Payer: Self-pay

## 2017-01-04 ENCOUNTER — Inpatient Hospital Stay (HOSPITAL_COMMUNITY): Payer: Medicare Other

## 2017-01-04 ENCOUNTER — Observation Stay (HOSPITAL_COMMUNITY)
Admission: AD | Admit: 2017-01-04 | Discharge: 2017-01-06 | Disposition: A | Discharge status: Home / Self Care | Payer: Medicare Other | Source: Ambulatory Visit | Attending: Internal Medicine | Admitting: Internal Medicine

## 2017-01-04 DIAGNOSIS — M5136 Other intervertebral disc degeneration, lumbar region: Secondary | ICD-10-CM | POA: Diagnosis not present

## 2017-01-04 DIAGNOSIS — R0602 Shortness of breath: Secondary | ICD-10-CM

## 2017-01-04 DIAGNOSIS — Z6834 Body mass index (BMI) 34.0-34.9, adult: Secondary | ICD-10-CM | POA: Diagnosis not present

## 2017-01-04 DIAGNOSIS — E1122 Type 2 diabetes mellitus with diabetic chronic kidney disease: Secondary | ICD-10-CM | POA: Insufficient documentation

## 2017-01-04 DIAGNOSIS — R000 Tachycardia, unspecified: Secondary | ICD-10-CM | POA: Diagnosis not present

## 2017-01-04 DIAGNOSIS — N179 Acute kidney failure, unspecified: Secondary | ICD-10-CM | POA: Diagnosis not present

## 2017-01-04 DIAGNOSIS — N1831 Chronic kidney disease, stage 3a: Secondary | ICD-10-CM

## 2017-01-04 DIAGNOSIS — Z794 Long term (current) use of insulin: Secondary | ICD-10-CM | POA: Insufficient documentation

## 2017-01-04 DIAGNOSIS — I951 Orthostatic hypotension: Secondary | ICD-10-CM | POA: Diagnosis not present

## 2017-01-04 DIAGNOSIS — K219 Gastro-esophageal reflux disease without esophagitis: Secondary | ICD-10-CM | POA: Insufficient documentation

## 2017-01-04 DIAGNOSIS — G629 Polyneuropathy, unspecified: Secondary | ICD-10-CM

## 2017-01-04 DIAGNOSIS — I130 Hypertensive heart and chronic kidney disease with heart failure and stage 1 through stage 4 chronic kidney disease, or unspecified chronic kidney disease: Secondary | ICD-10-CM | POA: Insufficient documentation

## 2017-01-04 DIAGNOSIS — N183 Chronic kidney disease, stage 3 unspecified: Secondary | ICD-10-CM

## 2017-01-04 DIAGNOSIS — G6289 Other specified polyneuropathies: Secondary | ICD-10-CM

## 2017-01-04 DIAGNOSIS — E782 Mixed hyperlipidemia: Secondary | ICD-10-CM | POA: Insufficient documentation

## 2017-01-04 DIAGNOSIS — Z8249 Family history of ischemic heart disease and other diseases of the circulatory system: Secondary | ICD-10-CM | POA: Insufficient documentation

## 2017-01-04 DIAGNOSIS — I5032 Chronic diastolic (congestive) heart failure: Secondary | ICD-10-CM | POA: Diagnosis not present

## 2017-01-04 DIAGNOSIS — G8929 Other chronic pain: Secondary | ICD-10-CM | POA: Diagnosis not present

## 2017-01-04 DIAGNOSIS — E1143 Type 2 diabetes mellitus with diabetic autonomic (poly)neuropathy: Secondary | ICD-10-CM | POA: Insufficient documentation

## 2017-01-04 DIAGNOSIS — F5104 Psychophysiologic insomnia: Secondary | ICD-10-CM | POA: Diagnosis not present

## 2017-01-04 DIAGNOSIS — R55 Syncope and collapse: Secondary | ICD-10-CM | POA: Diagnosis present

## 2017-01-04 DIAGNOSIS — E669 Obesity, unspecified: Secondary | ICD-10-CM | POA: Diagnosis not present

## 2017-01-04 DIAGNOSIS — E118 Type 2 diabetes mellitus with unspecified complications: Secondary | ICD-10-CM | POA: Diagnosis not present

## 2017-01-04 DIAGNOSIS — G250 Essential tremor: Secondary | ICD-10-CM | POA: Insufficient documentation

## 2017-01-04 DIAGNOSIS — M1990 Unspecified osteoarthritis, unspecified site: Secondary | ICD-10-CM | POA: Diagnosis not present

## 2017-01-04 DIAGNOSIS — F32A Depression, unspecified: Secondary | ICD-10-CM | POA: Diagnosis present

## 2017-01-04 DIAGNOSIS — F329 Major depressive disorder, single episode, unspecified: Secondary | ICD-10-CM | POA: Diagnosis not present

## 2017-01-04 LAB — CBC
HCT: 39.3 % (ref 36.0–46.0)
Hemoglobin: 13.1 g/dL (ref 12.0–15.0)
MCH: 28.6 pg (ref 26.0–34.0)
MCHC: 33.3 g/dL (ref 30.0–36.0)
MCV: 85.8 fL (ref 78.0–100.0)
Platelets: 226 10*3/uL (ref 150–400)
RBC: 4.58 MIL/uL (ref 3.87–5.11)
RDW: 13.7 % (ref 11.5–15.5)
WBC: 9.2 10*3/uL (ref 4.0–10.5)

## 2017-01-04 LAB — COMPREHENSIVE METABOLIC PANEL
ALT: 22 U/L (ref 14–54)
AST: 31 U/L (ref 15–41)
Albumin: 3.8 g/dL (ref 3.5–5.0)
Alkaline Phosphatase: 56 U/L (ref 38–126)
Anion gap: 11 (ref 5–15)
BUN: 27 mg/dL — ABNORMAL HIGH (ref 6–20)
CO2: 24 mmol/L (ref 22–32)
Calcium: 9.5 mg/dL (ref 8.9–10.3)
Chloride: 103 mmol/L (ref 101–111)
Creatinine, Ser: 1.19 mg/dL — ABNORMAL HIGH (ref 0.44–1.00)
GFR calc Af Amer: 48 mL/min — ABNORMAL LOW (ref >60)
GFR calc non Af Amer: 41 mL/min — ABNORMAL LOW (ref >60)
Glucose, Bld: 98 mg/dL (ref 65–99)
Potassium: 3.8 mmol/L (ref 3.5–5.1)
Sodium: 138 mmol/L (ref 135–145)
Total Bilirubin: 0.7 mg/dL (ref 0.3–1.2)
Total Protein: 7 g/dL (ref 6.5–8.1)

## 2017-01-04 LAB — MAGNESIUM: Magnesium: 1.3 mg/dL — ABNORMAL LOW (ref 1.7–2.4)

## 2017-01-04 LAB — TSH: TSH: 3.097 u[IU]/mL (ref 0.350–4.500)

## 2017-01-04 LAB — GLUCOSE, CAPILLARY
Glucose-Capillary: 134 mg/dL — ABNORMAL HIGH (ref 65–99)
Glucose-Capillary: 169 mg/dL — ABNORMAL HIGH (ref 65–99)

## 2017-01-04 LAB — PHOSPHORUS: Phosphorus: 2.4 mg/dL — ABNORMAL LOW (ref 2.5–4.6)

## 2017-01-04 LAB — D-DIMER, QUANTITATIVE: D-Dimer, Quant: 0.93 ug/mL-FEU — ABNORMAL HIGH (ref 0.00–0.50)

## 2017-01-04 LAB — T4, FREE: Free T4: 0.95 ng/dL (ref 0.61–1.12)

## 2017-01-04 LAB — TROPONIN I: Troponin I: 0.04 ng/mL (ref <0.03)

## 2017-01-04 MED ORDER — METOPROLOL TARTRATE 5 MG/5ML IV SOLN
10.0000 mg | Freq: Once | INTRAVENOUS | Status: AC
  Administered 2017-01-04: 5 mg via INTRAVENOUS
  Filled 2017-01-04: qty 10

## 2017-01-04 MED ORDER — SODIUM CHLORIDE 0.9 % IV SOLN
INTRAVENOUS | Status: DC
  Administered 2017-01-04 – 2017-01-06 (×4): via INTRAVENOUS

## 2017-01-04 MED ORDER — INSULIN ASPART PROT & ASPART (70-30 MIX) 100 UNIT/ML ~~LOC~~ SUSP
50.0000 [IU] | Freq: Every day | SUBCUTANEOUS | Status: DC
  Administered 2017-01-05: 50 [IU] via SUBCUTANEOUS
  Filled 2017-01-04: qty 10

## 2017-01-04 MED ORDER — ACETAMINOPHEN 325 MG PO TABS: 650.0000 mg | ORAL_TABLET | Freq: Four times a day (QID) | ORAL | Status: DC | PRN

## 2017-01-04 MED ORDER — INSULIN LISPRO PROT & LISPRO (75-25 MIX) 100 UNIT/ML KWIKPEN: 25.0000 [IU] | PEN_INJECTOR | Freq: Two times a day (BID) | SUBCUTANEOUS | Status: DC

## 2017-01-04 MED ORDER — ENSURE ENLIVE PO LIQD: 237.0000 mL | Freq: Two times a day (BID) | ORAL | Status: DC

## 2017-01-04 MED ORDER — HEPARIN BOLUS VIA INFUSION
4000.0000 [IU] | Freq: Once | INTRAVENOUS | Status: AC
  Administered 2017-01-04: 4000 [IU] via INTRAVENOUS
  Filled 2017-01-04: qty 4000

## 2017-01-04 MED ORDER — ONDANSETRON HCL 4 MG/2ML IJ SOLN: 4.0000 mg | Freq: Four times a day (QID) | INTRAMUSCULAR | Status: DC | PRN

## 2017-01-04 MED ORDER — ACETAMINOPHEN 650 MG RE SUPP: 650.0000 mg | Freq: Four times a day (QID) | RECTAL | Status: DC | PRN

## 2017-01-04 MED ORDER — MIDODRINE HCL 5 MG PO TABS
10.0000 mg | ORAL_TABLET | Freq: Two times a day (BID) | ORAL | Status: DC
  Administered 2017-01-04 – 2017-01-05 (×2): 10 mg via ORAL
  Filled 2017-01-04 (×2): qty 2

## 2017-01-04 MED ORDER — INSULIN ASPART 100 UNIT/ML ~~LOC~~ SOLN
0.0000 [IU] | Freq: Three times a day (TID) | SUBCUTANEOUS | Status: DC
  Administered 2017-01-04: 1 [IU] via SUBCUTANEOUS
  Administered 2017-01-05: 2 [IU] via SUBCUTANEOUS
  Administered 2017-01-06: 3 [IU] via SUBCUTANEOUS

## 2017-01-04 MED ORDER — TECHNETIUM TO 99M ALBUMIN AGGREGATED
4.3300 | Freq: Once | INTRAVENOUS | Status: AC | PRN
  Administered 2017-01-04: 4.33 via INTRAVENOUS

## 2017-01-04 MED ORDER — HEPARIN (PORCINE) IN NACL 100-0.45 UNIT/ML-% IJ SOLN
1300.0000 [IU]/h | INTRAMUSCULAR | Status: DC
  Administered 2017-01-04: 1300 [IU]/h via INTRAVENOUS
  Filled 2017-01-04: qty 250

## 2017-01-04 MED ORDER — INSULIN ASPART 100 UNIT/ML ~~LOC~~ SOLN: 0.0000 [IU] | Freq: Every day | SUBCUTANEOUS | Status: DC

## 2017-01-04 MED ORDER — PROPRANOLOL HCL 40 MG PO TABS
20.0000 mg | ORAL_TABLET | Freq: Two times a day (BID) | ORAL | Status: DC
  Administered 2017-01-04 – 2017-01-06 (×4): 20 mg via ORAL
  Filled 2017-01-04 (×4): qty 1

## 2017-01-04 MED ORDER — TECHNETIUM TC 99M DIETHYLENETRIAME-PENTAACETIC ACID
31.8000 | Freq: Once | INTRAVENOUS | Status: AC | PRN
  Administered 2017-01-04: 31.8 via RESPIRATORY_TRACT

## 2017-01-04 MED ORDER — INSULIN ASPART PROT & ASPART (70-30 MIX) 100 UNIT/ML ~~LOC~~ SUSP
50.0000 [IU] | Freq: Every day | SUBCUTANEOUS | Status: DC
  Administered 2017-01-04 – 2017-01-05 (×2): 50 [IU] via SUBCUTANEOUS
  Filled 2017-01-04: qty 10

## 2017-01-04 MED ORDER — GABAPENTIN 400 MG PO CAPS
400.0000 mg | ORAL_CAPSULE | Freq: Every day | ORAL | Status: DC
  Administered 2017-01-05 – 2017-01-06 (×2): 400 mg via ORAL
  Filled 2017-01-04 (×2): qty 1

## 2017-01-04 MED ORDER — HEPARIN SODIUM (PORCINE) 5000 UNIT/ML IJ SOLN: 5000.0000 [IU] | Freq: Three times a day (TID) | INTRAMUSCULAR | Status: DC

## 2017-01-04 MED ORDER — ONDANSETRON HCL 4 MG PO TABS: 4.0000 mg | ORAL_TABLET | Freq: Four times a day (QID) | ORAL | Status: DC | PRN

## 2017-01-04 NOTE — Progress Notes (Addendum)
Notified MD of critical troponin level 0.04. No new orders at this time. Will continue to monitor. Isac Caddy, RN

## 2017-01-04 NOTE — Progress Notes (Signed)
ANTICOAGULATION CONSULT NOTE - Initial Consult  Pharmacy Consult for Heparin Indication: rule out pulmonary embolus  Allergies  Allergen Reactions  . Ciprofloxacin Other (See Comments)    Acute kidney failure  . Codeine Nausea And Vomiting  . Dilaudid [Hydromorphone Hcl] Nausea And Vomiting and Other (See Comments)    Severe sweating  . Mysoline [Primidone] Nausea Only  . Oxycodone Nausea And Vomiting    Patient Measurements: Height: 5\' 8"  (172.7 cm) Weight: 227 lb 14.4 oz (103.4 kg) IBW/kg (Calculated) : 63.9 Heparin Dosing Weight: 86.9 kg  Vital Signs: Temp: 98.3 F (36.8 C) (04/30 1254) Temp Source: Oral (04/30 1254) BP: 142/71 (04/30 1254) Pulse Rate: 121 (04/30 1254)  Labs:  Recent Labs  01/04/17 1353  HGB 13.1  HCT 39.3  PLT 226  CREATININE 1.19*    Estimated Creatinine Clearance: 45.9 mL/min (A) (by C-G formula based on SCr of 1.19 mg/dL (H)).   Medical History: Past Medical History:  Diagnosis Date  . Anemia    as a teenager  . Arthritis   . Cancer ([place])    skin  . Cholelithiasis   . Chronic insomnia   . Chronic low back pain   . Complication of anesthesia   . DDD (degenerative disc disease), lumbar   . Diabetes mellitus    Iddm x 12 years  . Diabetic peripheral neuropathy ([place])   . Essential tremor 04/01/2015  . GERD (gastroesophageal reflux disease)   . Hx of acute renal failure 02/2014   [facility]mitted to [facility]  . Hx: UTI (urinary tract infection) 02/2014  . Hyperlipemia   . Hypertension   . Obesity   . Peripheral edema   . Pneumonia    as a child  . PONV (postoperative nausea and vomiting)    pt states Zofran not that helpful in the past, Phenergan works best for her  . Staph infection    1970's  . Umbilical hernia   . Wrist fracture    left October 2014    Medications:  Prescriptions Prior to Admission  Medication Sig Dispense Refill Last Dose  . gabapentin (NEURONTIN) 400 MG capsule Take 400 mg by mouth daily.    01/04/2017  at Unknown time  . HUMALOG MIX 75/25 KWIKPEN (75-25) 100 UNIT/ML Kwikpen Inject 25-50 Units into the skin 2 (two) times daily. Inject 50 units in the am and Inject 25 in the pm.   01/04/2017 at Unknown time  . losartan (COZAAR) 100 MG tablet Take 100 mg by mouth daily.   01/04/2017 at Unknown time  . metFORMIN (GLUCOPHAGE) 1000 MG tablet Take 1,000 mg by mouth 2 (two) times daily with a meal.    01/04/2017 at am  . midodrine (PROAMATINE) 5 MG tablet Take 1 tablet (5 mg total) by mouth 2 (two) times daily as needed. 60 tablet 1 01/04/2017 at Unknown time  . TRADJENTA 5 MG TABS tablet Take 5 mg by mouth daily.   01/04/2017 at Unknown time  . DULoxetine (CYMBALTA) 30 MG capsule Take 1 capsule (30 mg total) by mouth daily. (Patient not taking: Reported on 01/04/2017) 60 capsule 3 Not Taking at Unknown time    Assessment: 83 yo F direct admit from Dr. Irven Shelling office with symptomatic tachycardia (HR 120-140).  Elevated D-dimer.  Plan VQ scan to r/o PE.  To start heparin.  Goal of Therapy:  Heparin level 0.3-0.7 units/ml Monitor platelets by anticoagulation protocol: Yes   Plan:  Discontinue SQ heparin order (none given) Heparin 4000 unit IV  bolus x 1 Heparin infusion at 1300 units/hr. Heparin level in 8 hours. Heparin level and CBC daily while on heparin. Follow-up results of VQ scan.  [employer], Pharm.D., BCPS Clinical Pharmacist Pager: [phone] Clinical phone for 01/04/2017 from 3:30-11:00pm is [phone]. After 11pm, please call Main Rx [phone]) for assistance. 01/04/2017 5:19 PM

## 2017-01-04 NOTE — Consult Note (Signed)
CARDIOLOGY CONSULT NOTE  Patient ID: [name] MRN: [id number] DOB/AGE: 1933-04-25 83 y.o.  Admit date: 01/04/2017 Referring Physician  Linna Darner, MD Primary Physician:  Aretta Nip, MD Reason for Consultation  Dyspnea   HPI: [name]  is a 83 y.o. female who presents with orthostatic hypotension. Patient admitted patient was admitted to [facility] on 12/23/2016 and discharged on 12/25/2016 with diagnosis of autonomic insufficiency and orthostatic hypotension.  She now presents to the office, patient is markedly dyspneic and daughter states she is very tachycardic, continues to have dizziness and unable to even stand up for a minute , heart rate in 120-140 bpm. Family very concerned.  She has been unable to do any activities, in fact is now being cared for 24 hours even to go to the bathroom.  Patient also states that she was on propranolol and this was discontinued by her neurologist, and she has noticed worsening tremors.  No hemoptysis, has mild chronic leg edema that is unchanged.  Past Medical History:  Diagnosis Date  . Anemia    as a teenager  . Arthritis   . Cancer ([place])    skin  . Cholelithiasis   . Chronic insomnia   . Chronic low back pain   . Complication of anesthesia   . DDD (degenerative disc disease), lumbar   . Diabetes mellitus    Iddm x 12 years  . Diabetic peripheral neuropathy ([place])   . Essential tremor 04/01/2015  . GERD (gastroesophageal reflux disease)   . Hx of acute renal failure 02/2014   admitted to [facility]  . Hx: UTI (urinary tract infection) 02/2014  . Hyperlipemia   . Hypertension   . Obesity   . Peripheral edema   . Pneumonia    as a child  . PONV (postoperative nausea and vomiting)    pt states Zofran not that helpful in the past, Phenergan works best for her  . Staph infection    1970's  . Umbilical hernia   . Wrist fracture    left October 2014     Past Surgical History:  Procedure Laterality Date  .  ABDOMINAL HYSTERECTOMY    . BACK SURGERY  2005  . CARDIAC CATHETERIZATION  2000   Dr Einar Gip  . CATARACT EXTRACTION Bilateral    w/ lens implants  . COLONOSCOPY  >10 years  . EYE SURGERY    . HERNIA REPAIR    . LUMBAR LAMINECTOMY/DECOMPRESSION MICRODISCECTOMY Left 03/16/2014   Procedure: LUMBAR LAMINECTOMY/DECOMPRESSION MICRODISCECTOMY 1 LEVEL  lumbar level three/four;  Surgeon: [name], MD;  Location: [place];  Service: Neurosurgery;  Laterality: Left;  . LUMBAR LAMINECTOMY/DECOMPRESSION MICRODISCECTOMY Right 02/06/2015   Procedure: Right L4-5 Microdiskectomy;  Surgeon: Leeroy Cha, MD;  Location: [place] NEURO ORS;  Service: Neurosurgery;  Laterality: Right;  Right L4-5 Microdiskectomy  . NECK SURGERY    . OOPHORECTOMY    . TOTAL KNEE ARTHROPLASTY Right      Family History  Problem Relation Age of Onset  . Cancer Brother     prostate  . Heart disease Brother   . Alzheimer's disease Brother   . Stroke Mother   . Hypertension Mother   . COPD Brother   . Cancer Brother     Prostate cancer  . Hypertension Sister     Congestive heart failure  . Heart attack Son      Social History: Social History   Social History  . Marital status: Widowed  Spouse name: N/A  . Number of children: 3  . Years of education: 58   Occupational History  . Retired     Social History Main Topics  . Smoking status: Never Smoker  . Smokeless tobacco: Never Used  . Alcohol use No  . Drug use: No  . Sexual activity: No   Other Topics Concern  . Not on file   Social History Narrative   Patient lives at home alone.    Patient is widowed.    Patient has 3 children and 2 step children.    Patient is right handed.    Patient has a high school education and CNA certificate.     Patient is retired.    Patient does not drink caffeine.     Prescriptions Prior to Admission  Medication Sig Dispense Refill Last Dose  . gabapentin (NEURONTIN) 400 MG capsule Take 400 mg by mouth daily.     01/04/2017 at Unknown time  . HUMALOG MIX 75/25 KWIKPEN (75-25) 100 UNIT/ML Kwikpen Inject 25-50 Units into the skin 2 (two) times daily. Inject 50 units in the am and Inject 25 in the pm.   01/04/2017 at Unknown time  . losartan (COZAAR) 100 MG tablet Take 100 mg by mouth daily.   01/04/2017 at Unknown time  . metFORMIN (GLUCOPHAGE) 1000 MG tablet Take 1,000 mg by mouth 2 (two) times daily with a meal.    01/04/2017 at am  . midodrine (PROAMATINE) 5 MG tablet Take 1 tablet (5 mg total) by mouth 2 (two) times daily as needed. 60 tablet 1 01/04/2017 at Unknown time  . TRADJENTA 5 MG TABS tablet Take 5 mg by mouth daily.   01/04/2017 at Unknown time  . DULoxetine (CYMBALTA) 30 MG capsule Take 1 capsule (30 mg total) by mouth daily. (Patient not taking: Reported on 01/04/2017) 60 capsule 3 Not Taking at Unknown time     Review of Systems - General ROS: positive for  - fatigue Ophthalmic ROS: negative for - blurry vision or decreased vision ENT ROS: negative for - epistaxis or headaches Hematological and Lymphatic ROS: negative for - bleeding problems Endocrine ROS: positive for - diabetes negative for - hair pattern changes, hot flashes or polydipsia/polyuria Respiratory ROS: positive for - shortness of breath Cardiovascular ROS: negative for - chest pain or positive for palpitations Gastrointestinal ROS: no abdominal pain, change in bowel habits, or black or bloody stools Musculoskeletal ROS: positive for - joint pain Neurological ROS: no TIA or stroke symptoms    Physical Exam: Blood pressure (!) 142/71, pulse (!) 121, temperature 98.3 F (36.8 C), temperature source Oral, resp. rate 18, height 5\' 8"  (1.727 m), weight 103.4 kg (227 lb 14.4 oz), SpO2 96 %.  Body mass index is 34.65 kg/m.   General appearance: alert, cooperative, appears stated age and no distress Neck: no adenopathy, no carotid bruit, no JVD, supple, symmetrical, trachea midline and thyroid not enlarged, symmetric, no  tenderness/mass/nodules Lungs: clear to auscultation bilaterally Heart: Tachycardia present.  No murmur or gallop appreciated. Abdomen: soft, non-tender; bowel sounds normal; no masses,  no organomegaly and Mild pannus present. Extremities: extremities normal, atraumatic, no cyanosis or edema and Large adipose tissue present. Pulses: 2+ and symmetric Neurologic: Grossly normal  Labs:  Lab Results  Component Value Date   WBC 9.2 01/04/2017   HGB 13.1 01/04/2017   HCT 39.3 01/04/2017   MCV 85.8 01/04/2017   PLT 226 01/04/2017    Recent Labs Lab 01/04/17 1353  NA  138  K 3.8  CL 103  CO2 24  BUN 27*  CREATININE 1.19*  CALCIUM 9.5  PROT 7.0  BILITOT 0.7  ALKPHOS 56  ALT 22  AST 31  GLUCOSE 98    Lipid Panel     Component Value Date/Time   CHOL 152 05/16/2016 0508   TRIG 283 (H) 05/16/2016 0508   HDL 38 (L) 05/16/2016 0508   CHOLHDL 4.0 05/16/2016 0508   VLDL 57 (H) 05/16/2016 0508   LDLCALC 57 05/16/2016 0508    BNP (last 3 results) No results for input(s): BNP in the last 8760 hours.  HEMOGLOBIN A1C Lab Results  Component Value Date   HGBA1C 7.8 (H) 05/15/2016   MPG 177 05/15/2016    Cardiac Panel (last 3 results)  Recent Labs  05/15/16 2325 05/16/16 0508 05/16/16 0937  TROPONINI <0.03 <0.03 <0.03    Lab Results  Component Value Date   CKTOTAL 194 (H) 02/13/2014   CKMB 1.6 05/02/2011   TROPONINI <0.03 05/16/2016     TSH  Recent Labs  05/16/16 0508 01/04/17 1353  TSH 5.394* 3.097      Radiology: Dg Chest Port 1 View  Result Date: 01/04/2017 CLINICAL DATA:  Tachycardia. EXAM: PORTABLE CHEST 1 VIEW COMPARISON:  09/30/2016 FINDINGS: The heart size and mediastinal contours are within normal limits. Mild asymmetric elevation of right hemidiaphragm. Both lungs are clear. The visualized skeletal structures are unremarkable. IMPRESSION: No active disease. Electronically Signed   By: Kerby Moors M.D.   On: 01/04/2017 15:40    Scheduled  Meds: . [START ON 01/05/2017] gabapentin  400 mg Oral Daily  . heparin  4,000 Units Intravenous Once  . insulin aspart  0-5 Units Subcutaneous QHS  . insulin aspart  0-9 Units Subcutaneous TID WC  . [START ON 01/05/2017] insulin aspart protamine- aspart  50 Units Subcutaneous Q breakfast  . insulin aspart protamine- aspart  50 Units Subcutaneous Q supper  . midodrine  10 mg Oral BID WC  . propranolol  20 mg Oral BID   Continuous Infusions: . sodium chloride 100 mL/hr at 01/04/17 1636  . heparin     PRN Meds:.acetaminophen **OR** acetaminophen, ondansetron **OR** ondansetron (ZOFRAN) IV  CARDIAC STUDIES: EKG 01/04/17: Sinus tachycardia at the rate of 113 bpm, left axis deviation, LVH.  Poor R-wave progression, cannot exclude anterior infarct old.  Normal QT interval.  No evidence of ischemia  Echo: 12/25/2016:Left ventricle: The cavity size was normal. Wall thickness was normal. Systolic function was normal. The estimated ejectionfraction was in the range of 60% to 65%. Wall motion was normal;there were no regional wall motion abnormalities. Dopplerparameters are consistent with abnormal left ventricularrelaxation (grade 1 diastolic dysfunction). Pulmonary arteries: Systolic pressure was mildly increased. PApeak pressure: 37 mm Hg (S).  ASSESSMENT AND PLAN:  1.  Class IV dyspnea on exertion in a patient with resting sinus tachycardia, patient heart rate jumping all the way up to 150 bpm on standing probably related to underlying severe orthostatic hypotension but cannot explain her marked dyspnea. 2.  Orthostatic hypotension secondary to autonomic insufficiency from uncontrolled diabetes mellitus. 3.  Essential hypertension, leading to supine hypertension and orthostatic hypotension from diabetic autonomic neuropathy. 4.  Diabetes mellitus type II uncontrolled with diabetic autonomic neuropathy. 5.  Stage III chronic kidney disease secondary to diabetes mellitus. 6.  Mixed  hyperlipidemia  Recommendation: I'm very concerned about her presentation.  Given resting sinus tachycardia, there is odor proportion to her physical exam and acute presentation, I have recommended  that she be admitted to the hospital for further follow up and management.  Patient has been receiving around-the-clock care by the family members.  We still need to rule out pulmonary embolism.  Some of the symptoms may be related to stopping the beta blocker, propranolol however again this seems out of proportion.  I'll continue to follow patient along with you.  Adrian Prows, MD 01/04/2017, 5:41 PM [facility] [place] Pager: [phone] Office: [phone] If no answer Cell [phone]

## 2017-01-04 NOTE — Progress Notes (Signed)
Patient arrived via direct admission to 2W room 36.  Telemetry monitor applied and CCMD notified.  Patient oriented to unit and room to include call light and phone.  Will continue to monitor.

## 2017-01-04 NOTE — H&P (Signed)
History and Physical    [name] XIP:[id number] DOB: 02-27-1933 DOA: 01/04/2017  PCP: Aretta Nip, MD Patient coming from: cardiology office  Chief Complaint: Dizziness and near syncope  HPI: [name] is a 83 y.o. female with medical history significant of insomnia, diabetes, diabetic neuropathy, GERD, hyperlipidemia, C KD, hypertension, peripheral edema. Patient presenting directly from cardiology office, Dr Einar Gip, due to symptomatic tachycardia. Patient with a sustained heart rate at rest anywhere from the 120s to 140s with significant subjective complaints especially with ambulation. Patient denies any chest pain or recent worsening of her lower extremity edema which she has at baseline bilaterally. Patient does endorse palpitations over the last 4-5 days. Of note patient recently admitted to [facility] etiology mental status felt to be due to orthostatic hypotension which was treated with fluids and decreasing blood pressure medications. Patient was discharged was on hospital on 12/25/2016. Patient was then seen by her neurologist on 12/28/2016 and had her Cymbalta, clonidine and propranolol all discontinued. Patient states that since stopping her propranolol her tremor has gotten significantly worse in her hands. Patient complaints of intermittent dizziness and feelings of near-syncope which come on when going from lying or sitting to standing. Patient has been sleeping in a recliner since previous discharge. Nothing makes her symptoms better. Symptoms are reported to be getting worse every day. Patient endorses compliance with her Midodrine which she is prescribed on 12/25/2016. Denies any dysuria, frequency, flank pain, neck stiffness, headache, LOC, focal neurological deficit such as unilateral extremity weakness or facial asymmetry, fevers, productive cough, abdominal pain.   ED Course: NA  Review of Systems: As per HPI otherwise all other systems reviewed and are  negative  Ambulatory Status: Lives independently in an apartment but has family members checking on her throughout the day daily.  Past Medical History:  Diagnosis Date  . Anemia    as a teenager  . Arthritis   . Cancer ([place])    skin  . Cholelithiasis   . Chronic insomnia   . Chronic low back pain   . Complication of anesthesia   . DDD (degenerative disc disease), lumbar   . Diabetes mellitus    Iddm x 12 years  . Diabetic peripheral neuropathy ([place])   . Essential tremor 04/01/2015  . GERD (gastroesophageal reflux disease)   . Hx of acute renal failure 02/2014   admitted to [facility]  . Hx: UTI (urinary tract infection) 02/2014  . Hyperlipemia   . Hypertension   . Obesity   . Peripheral edema   . Pneumonia    as a child  . PONV (postoperative nausea and vomiting)    pt states Zofran not that helpful in the past, Phenergan works best for her  . Staph infection    1970's  . Umbilical hernia   . Wrist fracture    left October 2014    Past Surgical History:  Procedure Laterality Date  . ABDOMINAL HYSTERECTOMY    . BACK SURGERY  2005  . CARDIAC CATHETERIZATION  2000   Dr Einar Gip  . CATARACT EXTRACTION Bilateral    w/ lens implants  . COLONOSCOPY  >10 years  . EYE SURGERY    . HERNIA REPAIR    . LUMBAR LAMINECTOMY/DECOMPRESSION MICRODISCECTOMY Left 03/16/2014   Procedure: LUMBAR LAMINECTOMY/DECOMPRESSION MICRODISCECTOMY 1 LEVEL  lumbar level three/four;  Surgeon: [name], MD;  Location: [place];  Service: Neurosurgery;  Laterality: Left;  . LUMBAR LAMINECTOMY/DECOMPRESSION MICRODISCECTOMY Right 02/06/2015  Procedure: Right L4-5 Microdiskectomy;  Surgeon: Leeroy Cha, MD;  Location: [place] NEURO ORS;  Service: Neurosurgery;  Laterality: Right;  Right L4-5 Microdiskectomy  . NECK SURGERY    . OOPHORECTOMY    . TOTAL KNEE ARTHROPLASTY Right     Social History   Social History  . Marital status: Widowed    Spouse name: N/A  . Number of children: 3  . Years of  education: 26   Occupational History  . Retired     Social History Main Topics  . Smoking status: Never Smoker  . Smokeless tobacco: Never Used  . Alcohol use No  . Drug use: No  . Sexual activity: No   Other Topics Concern  . Not on file   Social History Narrative   Patient lives at home alone.    Patient is widowed.    Patient has 3 children and 2 step children.    Patient is right handed.    Patient has a high school education and CNA certificate.     Patient is retired.    Patient does not drink caffeine.    Allergies  Allergen Reactions  . Ciprofloxacin Other (See Comments)    Acute kidney failure  . Codeine Nausea And Vomiting  . Dilaudid [Hydromorphone Hcl] Nausea And Vomiting and Other (See Comments)    Severe sweating  . Mysoline [Primidone] Nausea Only  . Oxycodone Nausea And Vomiting    Family History  Problem Relation Age of Onset  . Cancer Brother     prostate  . Heart disease Brother   . Alzheimer's disease Brother   . Stroke Mother   . Hypertension Mother   . COPD Brother   . Cancer Brother     Prostate cancer  . Hypertension Sister     Congestive heart failure  . Heart attack Son     Prior to Admission medications   Medication Sig Start Date End Date Taking? Authorizing Provider  acetaminophen (TYLENOL) 500 MG tablet Take 1,000 mg by mouth every 6 (six) hours as needed for moderate pain.    Historical Provider, MD  chlorthalidone (HYGROTON) 25 MG tablet Take 25 mg by mouth daily.  01/23/14   Historical Provider, MD  DULoxetine (CYMBALTA) 30 MG capsule Take 1 capsule (30 mg total) by mouth daily. 12/25/16   Annita Brod, MD  feeding supplement, ENSURE ENLIVE, (ENSURE ENLIVE) LIQD Take 237 mLs by mouth daily. 12/26/16   Annita Brod, MD  gabapentin (NEURONTIN) 400 MG capsule Take 400 mg by mouth daily.     Historical Provider, MD  HUMALOG MIX 75/25 KWIKPEN (75-25) 100 UNIT/ML Kwikpen Inject 25-50 Units into the skin 2 (two) times daily.  Inject 50 units in the am and Inject 25 in the pm. 06/04/15   Historical Provider, MD  losartan (COZAAR) 100 MG tablet Take 100 mg by mouth daily.    Historical Provider, MD  metFORMIN (GLUCOPHAGE) 1000 MG tablet Take 1,000 mg by mouth 2 (two) times daily with a meal.     Historical Provider, MD  midodrine (PROAMATINE) 5 MG tablet Take 1 tablet (5 mg total) by mouth 2 (two) times daily as needed. 12/25/16   Annita Brod, MD  propranolol (INDERAL) 20 MG tablet Take 2 tablets (40 mg total) by mouth 2 (two) times daily. Patient taking differently: Take 20 mg by mouth 2 (two) times daily.  12/01/16   Kathrynn Ducking, MD  TRADJENTA 5 MG TABS tablet Take 5 mg by mouth  daily. 07/24/13   Historical Provider, MD    Physical Exam: Vitals:   01/04/17 1254  BP: (!) 142/71  Pulse: (!) 121  Resp: 18  Temp: 98.3 F (36.8 C)  TempSrc: Oral  SpO2: 96%  Weight: 103.4 kg (227 lb 14.4 oz)  Height: 5\' 8"  (1.727 m)     General:  Appears calm and comfortable Eyes:  PERRL, EOMI, normal lids, iris ENT:  grossly normal hearing, lips & tongue, mmm Neck:  no LAD, masses or thyromegaly Cardiovascular: III/VI systolic Trace 1+ LE pitting edema above compression stockings. Tachycardia Respiratory:  CTA bilaterally, no w/r/r. Normal respiratory effort. Abdomen:  soft, ntnd, NABS Skin:  no rash or induration seen on limited exam Musculoskeletal:  grossly normal tone BUE/BLE, good ROM, no bony abnormality Psychiatric:  grossly normal mood and affect, speech fluent and appropriate, AOx3 Neurologic:  CN 2-12 grossly intact, moves all extremities in coordinated fashion, sensation intact  Labs on Admission: I have personally reviewed following labs and imaging studies  CBC: No results for input(s): WBC, NEUTROABS, HGB, HCT, MCV, PLT in the last 168 hours. Basic Metabolic Panel: No results for input(s): NA, K, CL, CO2, GLUCOSE, BUN, CREATININE, CALCIUM, MG, PHOS in the last 168 hours. GFR: Estimated  Creatinine Clearance: 47 mL/min (A) (by C-G formula based on SCr of 1.16 mg/dL (H)). Liver Function Tests: No results for input(s): AST, ALT, ALKPHOS, BILITOT, PROT, ALBUMIN in the last 168 hours. No results for input(s): LIPASE, AMYLASE in the last 168 hours. No results for input(s): AMMONIA in the last 168 hours. Coagulation Profile: No results for input(s): INR, PROTIME in the last 168 hours. Cardiac Enzymes: No results for input(s): CKTOTAL, CKMB, CKMBINDEX, TROPONINI in the last 168 hours. BNP (last 3 results) No results for input(s): PROBNP in the last 8760 hours. HbA1C: No results for input(s): HGBA1C in the last 72 hours. CBG: No results for input(s): GLUCAP in the last 168 hours. Lipid Profile: No results for input(s): CHOL, HDL, LDLCALC, TRIG, CHOLHDL, LDLDIRECT in the last 72 hours. Thyroid Function Tests: No results for input(s): TSH, T4TOTAL, FREET4, T3FREE, THYROIDAB in the last 72 hours. Anemia Panel: No results for input(s): VITAMINB12, FOLATE, FERRITIN, TIBC, IRON, RETICCTPCT in the last 72 hours. Urine analysis:    Component Value Date/Time   COLORURINE YELLOW 12/23/2016 2351   APPEARANCEUR HAZY (A) 12/23/2016 2351   LABSPEC 1.017 12/23/2016 2351   PHURINE 5.0 12/23/2016 2351   GLUCOSEU >=500 (A) 12/23/2016 2351   HGBUR NEGATIVE 12/23/2016 2351   BILIRUBINUR NEGATIVE 12/23/2016 2351   KETONESUR NEGATIVE 12/23/2016 2351   PROTEINUR NEGATIVE 12/23/2016 2351   UROBILINOGEN 0.2 05/03/2014 0942   NITRITE NEGATIVE 12/23/2016 2351   LEUKOCYTESUR NEGATIVE 12/23/2016 2351    Creatinine Clearance: Estimated Creatinine Clearance: 47 mL/min (A) (by C-G formula based on SCr of 1.16 mg/dL (H)).  Sepsis Labs: @LABRCNTIP (procalcitonin:4,lacticidven:4) )No results found for this or any previous visit (from the past 240 hour(s)).   Radiological Exams on Admission: No results found.  EKG: Independently reviewed. Sinus tachycardia. No ACS.   Assessment/Plan Active  Problems:   Essential tremor   Depression   Tachycardia   CKD (chronic kidney disease) stage 3, GFR 30-59 ml/min   Diabetes mellitus with complication (HCC)   Peripheral neuropathy   Orthostatic hypotension   Tachycardia: Pt sent from Dr. Irven Shelling office as direct admission. Currently in sinus. Concern for betablocker rebound after stopping 7 days prior (now w/ 4-5 days of sx) vs PE vs severe volume depletion.  Pt extremely orthostatic despite stopping all recent BP meds and starting midodrine (discussion below).  - Resume propranolol though pt on low dose prior to admission (20mg  BID) - Lopressor 10mg  x1 - Troponin, Tele - CTA if not improving after lopressor or if trop significantly elevated pr D-Dimer + - if not responding to bblocker and D-dimer negative will consider adenosine.  - Thyroid studies.   Orthostatic Hypotension: persistent despite stopping home BP meds (lisinopril, chlorthalidone, clonidine, propranolol). Recent admission to [facility] on 4/20 for similar problem. Current BP drops from 142/71 lying to 131/79 sitting to 100/57 standing. Per last admission pt improved w/ fluids (also had AKI) and initiation of tapering off her cymbalta. Pt started on midodrine at time of discharge w/o benefit per pt.  - NS - Increase Midodrine to 10mg  BID  CKD: Cr 1.16. Difficult to identify pt baseline as Cr on 05/2016 was 0.94 w/ initial elevation to 1.5 in January and subsequent downtrend.  - IVF as above - BMET in am  DM: - continue Humalog 75/25 - SSI  DM neuropathy: - continue neurontin  Depression: recently stopped cymbalta. Doing well - monitor   DVT prophylaxis: Heparin  Code Status: full  Family Communication: none  Disposition Plan: pending improvement in BP and HR  Consults called: Dr. Einar Gip  Admission status: inpt.     Ellawyn Wogan J MD [facility]  If 7PM-7AM, please contact night-coverage [email or web address] Password [email or web address]  01/04/2017, 2:13 PM

## 2017-01-05 ENCOUNTER — Observation Stay (HOSPITAL_BASED_OUTPATIENT_CLINIC_OR_DEPARTMENT_OTHER): Payer: Medicare Other

## 2017-01-05 DIAGNOSIS — I951 Orthostatic hypotension: Secondary | ICD-10-CM | POA: Diagnosis not present

## 2017-01-05 DIAGNOSIS — E118 Type 2 diabetes mellitus with unspecified complications: Secondary | ICD-10-CM

## 2017-01-05 DIAGNOSIS — F329 Major depressive disorder, single episode, unspecified: Secondary | ICD-10-CM | POA: Diagnosis not present

## 2017-01-05 DIAGNOSIS — N183 Chronic kidney disease, stage 3 (moderate): Secondary | ICD-10-CM | POA: Diagnosis not present

## 2017-01-05 DIAGNOSIS — R0602 Shortness of breath: Secondary | ICD-10-CM

## 2017-01-05 DIAGNOSIS — R55 Syncope and collapse: Secondary | ICD-10-CM | POA: Diagnosis present

## 2017-01-05 LAB — URINALYSIS, ROUTINE W REFLEX MICROSCOPIC
Bilirubin Urine: NEGATIVE
Glucose, UA: 50 mg/dL — AB
Ketones, ur: NEGATIVE mg/dL
Leukocytes, UA: NEGATIVE
Nitrite: NEGATIVE
Protein, ur: NEGATIVE mg/dL
Specific Gravity, Urine: 1.008 (ref 1.005–1.030)
pH: 7 (ref 5.0–8.0)

## 2017-01-05 LAB — CBC
HCT: 35.6 % — ABNORMAL LOW (ref 36.0–46.0)
Hemoglobin: 11.6 g/dL — ABNORMAL LOW (ref 12.0–15.0)
MCH: 28 pg (ref 26.0–34.0)
MCHC: 32.6 g/dL (ref 30.0–36.0)
MCV: 86 fL (ref 78.0–100.0)
Platelets: 198 10*3/uL (ref 150–400)
RBC: 4.14 MIL/uL (ref 3.87–5.11)
RDW: 13.8 % (ref 11.5–15.5)
WBC: 9.2 10*3/uL (ref 4.0–10.5)

## 2017-01-05 LAB — T3, FREE: T3, Free: 2.8 pg/mL (ref 2.0–4.4)

## 2017-01-05 LAB — GLUCOSE, CAPILLARY
Glucose-Capillary: 109 mg/dL — ABNORMAL HIGH (ref 65–99)
Glucose-Capillary: 92 mg/dL (ref 65–99)
Glucose-Capillary: 161 mg/dL — ABNORMAL HIGH (ref 65–99)
Glucose-Capillary: 84 mg/dL (ref 65–99)

## 2017-01-05 LAB — HEPARIN LEVEL (UNFRACTIONATED)
Heparin Unfractionated: 0.49 IU/mL (ref 0.30–0.70)
Heparin Unfractionated: 0.54 IU/mL (ref 0.30–0.70)

## 2017-01-05 LAB — TROPONIN I
Troponin I: <0.03 ng/mL (ref <0.03)
Troponin I: <0.03 ng/mL (ref <0.03)

## 2017-01-05 MED ORDER — MIDODRINE HCL 5 MG PO TABS
10.0000 mg | ORAL_TABLET | Freq: Three times a day (TID) | ORAL | Status: DC
  Administered 2017-01-05 – 2017-01-06 (×4): 10 mg via ORAL
  Filled 2017-01-05 (×4): qty 2

## 2017-01-05 MED ORDER — ENOXAPARIN SODIUM 40 MG/0.4ML ~~LOC~~ SOLN
40.0000 mg | SUBCUTANEOUS | Status: DC
  Administered 2017-01-05: 40 mg via SUBCUTANEOUS
  Filled 2017-01-05: qty 0.4

## 2017-01-05 NOTE — Care Management CC44 (Signed)
Condition Code 44 Documentation Completed  Patient Details  Name: [name] MRN: [id number] Date of Birth: 01/18/1933   Condition Code 44 given:  Yes Patient signature on Condition Code 44 notice:  Yes Documentation of 2 MD's agreement:  Yes Code 44 added to claim:  Yes    Dawayne Patricia, RN 01/05/2017, 2:50 PM

## 2017-01-05 NOTE — Evaluation (Signed)
Physical Therapy Evaluation Patient Details Name: [name] MRN: [id number] DOB: 06/21/1933 Today's Date: 01/05/2017   History of Present Illness  [name] is a 83 y.o. female with medical history significant of insomnia, diabetes, diabetic neuropathy, GERD, hyperlipidemia, C KD, hypertension, peripheral edema. Patient presenting directly from cardiology office, Dr Einar Gip, due to symptomatic tachycardia.   Clinical Impression  Pt presenting with mild deconditioning, instability, and impaired balance requiring walker for safe mobility. Pt requires 24/7 supervision initially for safe d/c home. Pt reports family can provide that atleast for a week.    Follow Up Recommendations No PT follow up;Supervision/Assistance - 24 hour (pt deferred HHPT)    Equipment Recommendations  None recommended by PT    Recommendations for Other Services       Precautions / Restrictions Precautions Precautions: Fall Restrictions Weight Bearing Restrictions: No      Mobility  Bed Mobility Overal bed mobility: Modified Independent             General bed mobility comments: pt used bed rail to pull self up but reports she's been sleeping in a recliner due to MD orders  Transfers Overall transfer level: Needs assistance Equipment used: Rolling walker (2 wheeled) Transfers: Sit to/from Stand Sit to Stand: Min guard         General transfer comment: v/c's for hand placement  Ambulation/Gait Ambulation/Gait assistance: Min guard Ambulation Distance (Feet): 100 Feet Assistive device: Rolling walker (2 wheeled) Gait Pattern/deviations: Step-through pattern Gait velocity: slow Gait velocity interpretation: Below normal speed for age/gender General Gait Details: pt slow and cautious, mildly unsteady, short steps with decreased step height  Stairs            Wheelchair Mobility    Modified Rankin (Stroke Patients Only)       Balance Overall balance assessment: History of  Falls (requires RW for safe ambulation and standing)                                           Pertinent Vitals/Pain Pain Assessment: No/denies pain    Home Living Family/patient expects to be discharged to:: Private residence Living Arrangements: Alone Available Help at Discharge: Family Type of Home: Apartment Home Access: Level entry     Home Layout: One level Home Equipment: Tub bench;Grab bars - toilet;Grab bars - tub/shower Additional Comments: pt lives in senior apt.  Daughter and granddaughter live nearby.     Prior Function Level of Independence: Needs assistance   Gait / Transfers Assistance Needed: has been using [employer]  ADL's / Homemaking Assistance Needed: recently has required some assist for bathing, family drives her to store and appts        Hand Dominance   Dominant Hand: Right    Extremity/Trunk Assessment   Upper Extremity Assessment Upper Extremity Assessment: Generalized weakness    Lower Extremity Assessment Lower Extremity Assessment: Generalized weakness       Communication   Communication: No difficulties  Cognition Arousal/Alertness: Awake/alert Behavior During Therapy: WFL for tasks assessed/performed Overall Cognitive Status: Within Functional Limits for tasks assessed                                        General Comments General comments (skin integrity, edema, etc.): pt wears bilat compression stockings  Exercises     Assessment/Plan    PT Assessment Patient needs continued PT services  PT Problem List Decreased strength;Decreased mobility;Decreased balance;Decreased activity tolerance       PT Treatment Interventions DME instruction;Therapeutic exercise;Therapeutic activities;Patient/family education;Functional mobility training;Gait training    PT Goals (Current goals can be found in the Care Plan section)  Acute Rehab PT Goals Patient Stated Goal: home soon PT Goal  Formulation: With patient Time For Goal Achievement: 01/19/17 Potential to Achieve Goals: Good    Frequency Min 3X/week   Barriers to discharge        Co-evaluation               AM-PAC PT "6 Clicks" Daily Activity  Outcome Measure Difficulty turning over in bed (including adjusting bedclothes, sheets and blankets)?: None Difficulty moving from lying on back to sitting on the side of the bed? : A Little Difficulty sitting down on and standing up from a chair with arms (e.g., wheelchair, bedside commode, etc,.)?: A Little Help needed moving to and from a bed to chair (including a wheelchair)?: A Little Help needed walking in hospital room?: A Little Help needed climbing 3-5 steps with a railing? : A Lot 6 Click Score: 18    End of Session Equipment Utilized During Treatment: Gait belt Activity Tolerance: Patient tolerated treatment well Patient left: in chair;with call bell/phone within reach Nurse Communication: Mobility status PT Visit Diagnosis: Unsteadiness on feet (R26.81)    Time: 5374-8270 PT Time Calculation (min) (ACUTE ONLY): 20 min   Charges:   PT Evaluation $PT Eval Low Complexity: 1 Procedure     PT G Codes:   PT G-Codes **NOT FOR INPATIENT CLASS** Functional Assessment Tool Used: Clinical judgement Functional Limitation: Mobility: Walking and moving around Mobility: Walking and Moving Around Current Status (B8675): At least 20 percent but less than 40 percent impaired, limited or restricted Mobility: Walking and Moving Around Goal Status [phone]): At least 1 percent but less than 20 percent impaired, limited or restricted    [name], PT, DPT Pager #: [phone] Office #: [phone]   [place] 01/05/2017, 1:16 PM

## 2017-01-05 NOTE — Care Management Obs Status (Signed)
[facility] NOTIFICATION   Patient Details  Name: [name] MRN: [id number] Date of Birth: 1933/06/07   Medicare Observation Status Notification Given:  Yes    Dawayne Patricia, RN 01/05/2017, 2:50 PM

## 2017-01-05 NOTE — Progress Notes (Signed)
ANTICOAGULATION CONSULT NOTE - Follow Up Consult  Pharmacy Consult for Heparin  Indication: Rule out PE  Allergies  Allergen Reactions  . Ciprofloxacin Other (See Comments)    Acute kidney failure  . Codeine Nausea And Vomiting  . Dilaudid [Hydromorphone Hcl] Nausea And Vomiting and Other (See Comments)    Severe sweating  . Mysoline [Primidone] Nausea Only  . Oxycodone Nausea And Vomiting    Patient Measurements: Height: 5\' 8"  (172.7 cm) Weight: 227 lb 14.4 oz (103.4 kg) IBW/kg (Calculated) : 63.9  Vital Signs: Temp: 98.3 F (36.8 C) (04/30 2030) Temp Source: Oral (04/30 2030) BP: 126/88 (04/30 2030) Pulse Rate: 104 (04/30 2030)  Labs:  Recent Labs  01/04/17 1353 01/04/17 1921 01/05/17 0138  HGB 13.1  --  11.6*  HCT 39.3  --  35.6*  PLT 226  --  198  HEPARINUNFRC  --   --  0.49  CREATININE 1.19*  --   --   TROPONINI  --  0.04* <0.03    Estimated Creatinine Clearance: 45.9 mL/min (A) (by C-G formula based on SCr of 1.19 mg/dL (H)).  Assessment: Heparin for r/o PE, initial heparin level is therapeutic, VQ scan is negative for PE.   Goal of Therapy:  Heparin level 0.3-0.7 units/ml Monitor platelets by anticoagulation protocol: Yes   Plan:  -Cont heparin 1300 units/hr -Consider DC heparin with normal VQ, if not, check 1200 HL  Kelin Nixon 01/05/2017,2:39 AM

## 2017-01-05 NOTE — Progress Notes (Signed)
[facility] PROGRESS NOTE  [name] LOV:[id number] DOB: [birth year] DOA: 01/04/2017   PCP: Aretta Nip, MD     Assessment/Plan: Active Problems:   Essential tremor   Depression   Tachycardia   CKD (chronic kidney disease) stage 3, GFR 30-59 ml/min   Diabetes mellitus with complication (HCC)   Peripheral neuropathy   Orthostatic hypotension   83 y.o. female with medical history significant of insomnia, diabetes, diabetic neuropathy, GERD, hyperlipidemia, C KD, hypertension, peripheral edema. Patient presenting directly from cardiology office, Dr Einar Gip, due to symptomatic tachycardia. Patient with a sustained heart rate at rest anywhere from the 120s to 140s with significant subjective complaints especially with ambulation. Patient admitted for orthostatic hypotension and sinus tachycardia workup.  Assessment and plan Sinus Tachycardia:  Pt sent from Dr. Irven Shelling office as direct admission. Currently in sinus. Found to be orthostatic on admission  Concern for betablocker rebound after stopping 7 days prior (now w/ 4-5 days of sx) vs PE vs severe volume depletion.  VQ scan negative Pt extremely orthostatic despite stopping all recent BP meds and starting midodrine (discussion below).  Resumed propranolol  01/05/17  (20mg  BID), tachycardia improving  Troponin negative, telemetry shows sinus tachycardia CTA if not improving after lopressor or if trop significantly elevated,  d-dimer 0.93, will order venous Doppler bilateral lower extremities to rule out DVT Thyroid studies within normal limits Most recent 2-D echo 12/25/16-LV EF: 60% -   65%.Systolic pressure was mildly increased. PA peak pressure: 37 mm Hg (S).  Orthostatic Hypotension: persistent despite stopping home BP meds (lisinopril, chlorthalidone, clonidine, propranolol). Recent admission to [facility] on 4/20 for similar problem. Current BP drops from 142/71 lying to 131/79 sitting to 100/57 standing. Per last  admission pt improved w/ fluids (also had AKI) and initiation of tapering off her cymbalta. Pt started on midodrine at time of discharge , but it is significantly low dose Continue normal saline Increase Midodrine to 10mg  3 times a day   CKD: Cr 1.16. Difficult to identify pt baseline as Cr on 05/2016 was 0.94 w/ initial elevation to 1.5 in January and subsequent downtrend.  Follow kidney function closely  DM: Check hemoglobin A1c - Stable Accu-Cheks, continue  Humalog 75/25 - SSI  DM neuropathy: - continue neurontin  Depression: recently stopped cymbalta. Doing well - monitor     DVT prophylaxsis heparin drip  Code Status:    Full code  Family Communication: Discussed in detail with the patient, all imaging results, lab results explained to the patient   Disposition Plan:  Anticipate discharge tomorrow      Consultants:  Cardiology  Procedures:  None  Antibiotics: Anti-infectives    None         HPI/Subjective: Feels like her tremors have improved , less  Faint and dizzy this am   Objective: Vitals:   01/04/17 1254 01/04/17 2030 01/05/17 0420  BP: (!) 142/71 126/88 (!) 151/72  Pulse: (!) 121 (!) 104 76  Resp: 18 20 19   Temp: 98.3 F (36.8 C) 98.3 F (36.8 C) 98 F (36.7 C)  TempSrc: Oral Oral Oral  SpO2: 96% 98% 97%  Weight: 103.4 kg (227 lb 14.4 oz)  104.1 kg (229 lb 8 oz)  Height: 5\' 8"  (1.727 m)  5\' 8"  (1.727 m)    Intake/Output Summary (Last 24 hours) at 01/05/17 0907 Last data filed at 01/05/17 0300  Gross per 24 hour  Intake  1382.27 ml  Output                0 ml  Net          1382.27 ml    Exam:  Examination:  General exam: Appears calm and comfortable  Respiratory system: Clear to auscultation. Respiratory effort normal. Cardiovascular system: S1 & S2 heard, RRR. No JVD, murmurs, rubs, gallops or clicks. No pedal edema. Gastrointestinal system: Abdomen is nondistended, soft and nontender. No organomegaly or  masses felt. Normal bowel sounds heard. Central nervous system: Alert and oriented. No focal neurological deficits. Extremities: Symmetric 5 x 5 power. Skin: No rashes, lesions or ulcers Psychiatry: Judgement and insight appear normal. Mood & affect appropriate.     Data Reviewed: I have personally reviewed following labs and imaging studies  Micro Results No results found for this or any previous visit (from the past 240 hour(s)).  Radiology Reports Ct Head Wo Contrast  Result Date: 12/23/2016 CLINICAL DATA:  Weakness with ambulation.  History of diabetes. EXAM: CT HEAD WITHOUT CONTRAST TECHNIQUE: Contiguous axial images were obtained from the base of the skull through the vertex without intravenous contrast. COMPARISON:  MRI brain 05/15/2016.  CT head 12/21/2015. FINDINGS: Brain: No evidence of acute infarction, hemorrhage, hydrocephalus, extra-axial collection or mass lesion/mass effect. Mild cerebral atrophy. Patchy low-attenuation changes in the deep white matter consistent small vessel ischemia. Vascular: No hyperdense vessel or unexpected calcification. Skull: Normal. Negative for fracture or focal lesion. Sinuses/Orbits: No acute finding. Other: No significant change since previous study. IMPRESSION: No acute intracranial abnormalities. Mild chronic atrophy and small vessel ischemic changes. Electronically Signed   By: Lucienne Capers M.D.   On: 12/23/2016 23:46   Nm Pulmonary Perf And Vent  Result Date: 01/04/2017 CLINICAL DATA:  Tachycardia.  Inpatient. EXAM: NUCLEAR MEDICINE VENTILATION - PERFUSION LUNG SCAN TECHNIQUE: Ventilation images were obtained in multiple projections using inhaled aerosol Tc-22m DTPA. Perfusion images were obtained in multiple projections after intravenous injection of Tc-26m MAA. RADIOPHARMACEUTICALS:  31.8 mCi Technetium-83m DTPA aerosol inhalation and 4.3 mCi Technetium-20m MAA IV COMPARISON:  Chest radiograph from earlier today. FINDINGS: Ventilation: No  focal ventilation defect. Perfusion: No wedge shaped peripheral perfusion defects to suggest acute pulmonary embolism. IMPRESSION: Normal V/Q scan. Electronically Signed   By: Ilona Sorrel M.D.   On: 01/04/2017 18:17   Dg Chest Port 1 View  Result Date: 01/04/2017 CLINICAL DATA:  Tachycardia. EXAM: PORTABLE CHEST 1 VIEW COMPARISON:  09/30/2016 FINDINGS: The heart size and mediastinal contours are within normal limits. Mild asymmetric elevation of right hemidiaphragm. Both lungs are clear. The visualized skeletal structures are unremarkable. IMPRESSION: No active disease. Electronically Signed   By: Kerby Moors M.D.   On: 01/04/2017 15:40     CBC  Recent Labs Lab 01/04/17 1353 01/05/17 0138  WBC 9.2 9.2  HGB 13.1 11.6*  HCT 39.3 35.6*  PLT 226 198  MCV 85.8 86.0  MCH 28.6 28.0  MCHC 33.3 32.6  RDW 13.7 13.8    Chemistries   Recent Labs Lab 01/04/17 1353  NA 138  K 3.8  CL 103  CO2 24  GLUCOSE 98  BUN 27*  CREATININE 1.19*  CALCIUM 9.5  MG 1.3*  AST 31  ALT 22  ALKPHOS 56  BILITOT 0.7   ------------------------------------------------------------------------------------------------------------------ estimated creatinine clearance is 46 mL/min (A) (by C-G formula based on SCr of 1.19 mg/dL (H)). ------------------------------------------------------------------------------------------------------------------ No results for input(s): HGBA1C in the last 72 hours. ------------------------------------------------------------------------------------------------------------------ No results for input(s): CHOL,  HDL, LDLCALC, TRIG, CHOLHDL, LDLDIRECT in the last 72 hours. ------------------------------------------------------------------------------------------------------------------  Recent Labs  01/04/17 1353  TSH 3.097  T3FREE 2.8   ------------------------------------------------------------------------------------------------------------------ No results for  input(s): VITAMINB12, FOLATE, FERRITIN, TIBC, IRON, RETICCTPCT in the last 72 hours.  Coagulation profile No results for input(s): INR, PROTIME in the last 168 hours.   Recent Labs  01/04/17 1353  DDIMER 0.93*    Cardiac Enzymes  Recent Labs Lab 01/04/17 1921 01/05/17 0138 01/05/17 0726  TROPONINI 0.04* <0.03 <0.03   ------------------------------------------------------------------------------------------------------------------ Invalid input(s): POCBNP   CBG:  Recent Labs Lab 01/04/17 1649 01/04/17 2030 01/05/17 0614  GLUCAP 134* 169* 109*       Studies: Nm Pulmonary Perf And Vent  Result Date: 01/04/2017 CLINICAL DATA:  Tachycardia.  Inpatient. EXAM: NUCLEAR MEDICINE VENTILATION - PERFUSION LUNG SCAN TECHNIQUE: Ventilation images were obtained in multiple projections using inhaled aerosol Tc-29m DTPA. Perfusion images were obtained in multiple projections after intravenous injection of Tc-21m MAA. RADIOPHARMACEUTICALS:  31.8 mCi Technetium-24m DTPA aerosol inhalation and 4.3 mCi Technetium-81m MAA IV COMPARISON:  Chest radiograph from earlier today. FINDINGS: Ventilation: No focal ventilation defect. Perfusion: No wedge shaped peripheral perfusion defects to suggest acute pulmonary embolism. IMPRESSION: Normal V/Q scan. Electronically Signed   By: Ilona Sorrel M.D.   On: 01/04/2017 18:17   Dg Chest Port 1 View  Result Date: 01/04/2017 CLINICAL DATA:  Tachycardia. EXAM: PORTABLE CHEST 1 VIEW COMPARISON:  09/30/2016 FINDINGS: The heart size and mediastinal contours are within normal limits. Mild asymmetric elevation of right hemidiaphragm. Both lungs are clear. The visualized skeletal structures are unremarkable. IMPRESSION: No active disease. Electronically Signed   By: Kerby Moors M.D.   On: 01/04/2017 15:40      Lab Results  Component Value Date   HGBA1C 7.8 (H) 05/15/2016   HGBA1C 10.0 (H) 01/31/2015   HGBA1C 7.1 (H) 03/18/2014   Lab Results  Component  Value Date   LDLCALC 57 05/16/2016   CREATININE 1.19 (H) 01/04/2017       Scheduled Meds: . gabapentin  400 mg Oral Daily  . insulin aspart  0-5 Units Subcutaneous QHS  . insulin aspart  0-9 Units Subcutaneous TID WC  . insulin aspart protamine- aspart  50 Units Subcutaneous Q breakfast  . insulin aspart protamine- aspart  50 Units Subcutaneous Q supper  . midodrine  10 mg Oral TID WC  . propranolol  20 mg Oral BID   Continuous Infusions: . sodium chloride 100 mL/hr at 01/05/17 0427  . heparin 1,300 Units/hr (01/04/17 1908)     LOS: 1 day    Time spent: >30 MINS    Reyne Dumas  [facility] Pager [phone]. If 7PM-7AM, please contact night-coverage at [email or web address], password [facility] 01/05/2017, 9:07 AM  LOS: 1 day

## 2017-01-06 DIAGNOSIS — G250 Essential tremor: Secondary | ICD-10-CM

## 2017-01-06 DIAGNOSIS — E118 Type 2 diabetes mellitus with unspecified complications: Secondary | ICD-10-CM | POA: Diagnosis not present

## 2017-01-06 DIAGNOSIS — I951 Orthostatic hypotension: Secondary | ICD-10-CM | POA: Diagnosis not present

## 2017-01-06 LAB — COMPREHENSIVE METABOLIC PANEL
ALT: 18 U/L (ref 14–54)
AST: 24 U/L (ref 15–41)
Albumin: 3.1 g/dL — ABNORMAL LOW (ref 3.5–5.0)
Alkaline Phosphatase: 51 U/L (ref 38–126)
Anion gap: 7 (ref 5–15)
BUN: 21 mg/dL — ABNORMAL HIGH (ref 6–20)
CO2: 23 mmol/L (ref 22–32)
Calcium: 8.6 mg/dL — ABNORMAL LOW (ref 8.9–10.3)
Chloride: 110 mmol/L (ref 101–111)
Creatinine, Ser: 0.99 mg/dL (ref 0.44–1.00)
GFR calc Af Amer: 60 mL/min — ABNORMAL LOW (ref >60)
GFR calc non Af Amer: 52 mL/min — ABNORMAL LOW (ref >60)
Glucose, Bld: 127 mg/dL — ABNORMAL HIGH (ref 65–99)
Potassium: 4.2 mmol/L (ref 3.5–5.1)
Sodium: 140 mmol/L (ref 135–145)
Total Bilirubin: 0.4 mg/dL (ref 0.3–1.2)
Total Protein: 5.8 g/dL — ABNORMAL LOW (ref 6.5–8.1)

## 2017-01-06 LAB — CBC
HCT: 33.9 % — ABNORMAL LOW (ref 36.0–46.0)
Hemoglobin: 10.8 g/dL — ABNORMAL LOW (ref 12.0–15.0)
MCH: 27.5 pg (ref 26.0–34.0)
MCHC: 31.9 g/dL (ref 30.0–36.0)
MCV: 86.3 fL (ref 78.0–100.0)
Platelets: 185 10*3/uL (ref 150–400)
RBC: 3.93 MIL/uL (ref 3.87–5.11)
RDW: 13.9 % (ref 11.5–15.5)
WBC: 7.6 10*3/uL (ref 4.0–10.5)

## 2017-01-06 LAB — GLUCOSE, CAPILLARY
Glucose-Capillary: 124 mg/dL — ABNORMAL HIGH (ref 65–99)
Glucose-Capillary: 118 mg/dL — ABNORMAL HIGH (ref 65–99)
Glucose-Capillary: 184 mg/dL — ABNORMAL HIGH (ref 65–99)

## 2017-01-06 LAB — HEMOGLOBIN A1C
Hgb A1c MFr Bld: 8.2 % — ABNORMAL HIGH (ref 4.8–5.6)
Mean Plasma Glucose: 189 mg/dL

## 2017-01-06 MED ORDER — MAGNESIUM SULFATE 50 % IJ SOLN
3.0000 g | Freq: Once | INTRAMUSCULAR | Status: AC
  Administered 2017-01-06: 3 g via INTRAVENOUS
  Filled 2017-01-06: qty 6

## 2017-01-06 MED ORDER — MAGNESIUM OXIDE 400 MG PO TABS: 400.0000 mg | ORAL_TABLET | Freq: Two times a day (BID) | ORAL | 2 refills | Status: AC

## 2017-01-06 MED ORDER — MIDODRINE HCL 10 MG PO TABS: 10.0000 mg | ORAL_TABLET | Freq: Three times a day (TID) | ORAL | 1 refills | Status: DC

## 2017-01-06 MED ORDER — PROPRANOLOL HCL 20 MG PO TABS: 20.0000 mg | ORAL_TABLET | Freq: Two times a day (BID) | ORAL | 2 refills | Status: DC

## 2017-01-06 MED ORDER — GABAPENTIN 400 MG PO CAPS: 400.0000 mg | ORAL_CAPSULE | Freq: Every day | ORAL | 0 refills | Status: DC

## 2017-01-06 NOTE — Discharge Summary (Addendum)
Physician Discharge Summary  [name] MRN: [id number] DOB/AGE: Sep 17, 1932 83 y.o.  PCP: Aretta Nip, MD   Admit date: 01/04/2017 Discharge date: 01/06/2017  Discharge Diagnoses:    Active Problems:   Essential tremor   Depression   Tachycardia   CKD (chronic kidney disease) stage 3, GFR 30-59 ml/min   Diabetes mellitus with complication (HCC)   Peripheral neuropathy   Orthostatic hypotension   Syncope    Follow-up recommendations Follow-up with PCP in 3-5 days , including all  additional recommended appointments as below Follow-up CBC, CMP in 3-5 days Patient is being discharged with 24-hour supervision Follow up with Adrian Prows, MD, as scheduled by there office      Current Discharge Medication List    START taking these medications   Details  magnesium oxide (MAG-OX) 400 MG tablet Take 1 tablet (400 mg total) by mouth 2 (two) times daily. Qty: 60 tablet, Refills: 2      CONTINUE these medications which have CHANGED   Details  gabapentin (NEURONTIN) 400 MG capsule Take 1 capsule (400 mg total) by mouth at bedtime. Qty: 10 capsule, Refills: 0    midodrine (PROAMATINE) 10 MG tablet Take 1 tablet (10 mg total) by mouth 3 (three) times daily with meals. Qty: 90 tablet, Refills: 1    propranolol (INDERAL) 20 MG tablet Take 1 tablet (20 mg total) by mouth 2 (two) times daily. Qty: 60 tablet, Refills: 2      CONTINUE these medications which have NOT CHANGED   Details  HUMALOG MIX 75/25 KWIKPEN (75-25) 100 UNIT/ML Kwikpen Inject 25-50 Units into the skin 2 (two) times daily. Inject 50 units in the am and Inject 25 in the pm.    TRADJENTA 5 MG TABS tablet Take 5 mg by mouth daily.      STOP taking these medications     losartan (COZAAR) 100 MG tablet      metFORMIN (GLUCOPHAGE) 1000 MG tablet      DULoxetine (CYMBALTA) 30 MG capsule            Discharge Condition:  Stable   Discharge Instructions Get Medicines reviewed and adjusted: Please  take all your medications with you for your next visit with your Primary MD  Please request your Primary MD to go over all hospital tests and procedure/radiological results at the follow up, please ask your Primary MD to get all Hospital records sent to his/her office.  If you experience worsening of your admission symptoms, develop shortness of breath, life threatening emergency, suicidal or homicidal thoughts you must seek medical attention immediately by calling 911 or calling your MD immediately if symptoms less severe.  You must read complete instructions/literature along with all the possible adverse reactions/side effects for all the Medicines you take and that have been prescribed to you. Take any new Medicines after you have completely understood and accpet all the possible adverse reactions/side effects.   Do not drive when taking Pain medications.   Do not take more than prescribed Pain, Sleep and Anxiety Medications  Special Instructions: If you have smoked or chewed Tobacco in the last 2 yrs please stop smoking, stop any regular Alcohol and or any Recreational drug use.  Wear Seat belts while driving.  Please note  You were cared for by a hospitalist during your hospital stay. Once you are discharged, your primary care physician will handle any further medical issues. Please note that NO REFILLS for any discharge medications will be authorized once you  are discharged, as it is imperative that you return to your primary care physician (or establish a relationship with a primary care physician if you do not have one) for your aftercare needs so that they can reassess your need for medications and monitor your lab values.     Allergies  Allergen Reactions  . Ciprofloxacin Other (See Comments)    Acute kidney failure  . Codeine Nausea And Vomiting  . Dilaudid [Hydromorphone Hcl] Nausea And Vomiting and Other (See Comments)    Severe sweating  . Mysoline [Primidone] Nausea  Only  . Oxycodone Nausea And Vomiting      Disposition: 24-hour supervision   Consults:  None  Significant Diagnostic Studies:  Ct Head Wo Contrast  Result Date: 12/23/2016 CLINICAL DATA:  Weakness with ambulation.  History of diabetes. EXAM: CT HEAD WITHOUT CONTRAST TECHNIQUE: Contiguous axial images were obtained from the base of the skull through the vertex without intravenous contrast. COMPARISON:  MRI brain 05/15/2016.  CT head 12/21/2015. FINDINGS: Brain: No evidence of acute infarction, hemorrhage, hydrocephalus, extra-axial collection or mass lesion/mass effect. Mild cerebral atrophy. Patchy low-attenuation changes in the deep white matter consistent small vessel ischemia. Vascular: No hyperdense vessel or unexpected calcification. Skull: Normal. Negative for fracture or focal lesion. Sinuses/Orbits: No acute finding. Other: No significant change since previous study. IMPRESSION: No acute intracranial abnormalities. Mild chronic atrophy and small vessel ischemic changes. Electronically Signed   By: Lucienne Capers M.D.   On: 12/23/2016 23:46   Nm Pulmonary Perf And Vent  Result Date: 01/04/2017 CLINICAL DATA:  Tachycardia.  Inpatient. EXAM: NUCLEAR MEDICINE VENTILATION - PERFUSION LUNG SCAN TECHNIQUE: Ventilation images were obtained in multiple projections using inhaled aerosol Tc-70mDTPA. Perfusion images were obtained in multiple projections after intravenous injection of Tc-974mAA. RADIOPHARMACEUTICALS:  31.8 mCi Technetium-9955mPA aerosol inhalation and 4.3 mCi Technetium-40m69m IV COMPARISON:  Chest radiograph from earlier today. FINDINGS: Ventilation: No focal ventilation defect. Perfusion: No wedge shaped peripheral perfusion defects to suggest acute pulmonary embolism. IMPRESSION: Normal V/Q scan. Electronically Signed   By: JasoIlona Sorrel.   On: 01/04/2017 18:17   Dg Chest Port 1 View  Result Date: 01/04/2017 CLINICAL DATA:  Tachycardia. EXAM: PORTABLE CHEST 1 VIEW  COMPARISON:  09/30/2016 FINDINGS: The heart size and mediastinal contours are within normal limits. Mild asymmetric elevation of right hemidiaphragm. Both lungs are clear. The visualized skeletal structures are unremarkable. IMPRESSION: No active disease. Electronically Signed   By: TaylKerby Moors.   On: 01/04/2017 15:40    Echo  LV EF: 60% -   65%  ------------------------------------------------------------------- Indications:      Syncope 780.2.  ------------------------------------------------------------------- History:   Risk factors:  Hypertension. Diabetes mellitus. Obese.   ------------------------------------------------------------------- Study Conclusions  - Left ventricle: The cavity size was normal. Wall thickness was   normal. Systolic function was normal. The estimated ejection   fraction was in the range of 60% to 65%. Wall motion was normal;   there were no regional wall motion abnormalities. Doppler   parameters are consistent with abnormal left ventricular   relaxation (grade 1 diastolic dysfunction). - Pulmonary arteries: Systolic pressure was mildly increased. PA   peak pressure: 37 mm Hg (S).   Filed Weights   01/04/17 1254 01/05/17 0420 01/06/17 0345  Weight: 103.4 kg (227 lb 14.4 oz) 104.1 kg (229 lb 8 oz) 104 kg (229 lb 3.2 oz)     Microbiology: No results found for this or any previous visit (from the past 240  hour(s)).     Blood Culture    Component Value Date/Time   SDES WOUND BACK 03/23/2014 1602   SPECREQUEST NONE 03/23/2014 1602   CULT  03/23/2014 1602    FEW YEAST CONSISTENT WITH CANDIDA SPECIES Performed at [facility]   REPTSTATUS 03/26/2014 FINAL 03/23/2014 1602      Labs: Results for orders placed or performed during the hospital encounter of 01/04/17 (from the past 48 hour(s))  Comprehensive metabolic panel     Status: Abnormal   Collection Time: 01/04/17  1:53 PM  Result Value Ref Range   Sodium 138 135 -  145 mmol/L   Potassium 3.8 3.5 - 5.1 mmol/L   Chloride 103 101 - 111 mmol/L   CO2 24 22 - 32 mmol/L   Glucose, Bld 98 65 - 99 mg/dL   BUN 27 (H) 6 - 20 mg/dL   Creatinine, Ser 1.19 (H) 0.44 - 1.00 mg/dL   Calcium 9.5 8.9 - 10.3 mg/dL   Total Protein 7.0 6.5 - 8.1 g/dL   Albumin 3.8 3.5 - 5.0 g/dL   AST 31 15 - 41 U/L   ALT 22 14 - 54 U/L   Alkaline Phosphatase 56 38 - 126 U/L   Total Bilirubin 0.7 0.3 - 1.2 mg/dL   GFR calc non Af Amer 41 (L) >60 mL/min   GFR calc Af Amer 48 (L) >60 mL/min    Comment: (NOTE) The eGFR has been calculated using the CKD EPI equation. This calculation has not been validated in all clinical situations. eGFR's persistently <60 mL/min signify possible Chronic Kidney Disease.    Anion gap 11 5 - 15  CBC     Status: None   Collection Time: 01/04/17  1:53 PM  Result Value Ref Range   WBC 9.2 4.0 - 10.5 K/uL   RBC 4.58 3.87 - 5.11 MIL/uL   Hemoglobin 13.1 12.0 - 15.0 g/dL   HCT 39.3 36.0 - 46.0 %   MCV 85.8 78.0 - 100.0 fL   MCH 28.6 26.0 - 34.0 pg   MCHC 33.3 30.0 - 36.0 g/dL   RDW 13.7 11.5 - 15.5 %   Platelets 226 150 - 400 K/uL  T4, free     Status: None   Collection Time: 01/04/17  1:53 PM  Result Value Ref Range   Free T4 0.95 0.61 - 1.12 ng/dL    Comment: (NOTE) Biotin ingestion may interfere with free T4 tests. If the results are inconsistent with the TSH level, previous test results, or the clinical presentation, then consider biotin interference. If needed, order repeat testing after stopping biotin.   T3, free     Status: None   Collection Time: 01/04/17  1:53 PM  Result Value Ref Range   T3, Free 2.8 2.0 - 4.4 pg/mL    Comment: (NOTE) Performed At: [facility] [address] [place], [place] [id number] Lindon Romp MD [id number]   TSH     Status: None   Collection Time: 01/04/17  1:53 PM  Result Value Ref Range   TSH 3.097 0.350 - 4.500 uIU/mL    Comment: Performed by a 3rd Generation assay with a functional  sensitivity of <=0.01 uIU/mL.  D-dimer, quantitative (not at [facility])     Status: Abnormal   Collection Time: 01/04/17  1:53 PM  Result Value Ref Range   D-Dimer, Quant 0.93 (H) 0.00 - 0.50 ug/mL-FEU    Comment: (NOTE) At the manufacturer cut-off of 0.50 ug/mL FEU, this assay has  been documented to exclude PE with a sensitivity and negative predictive value of 97 to 99%.  At this time, this assay has not been approved by the FDA to exclude DVT/VTE. Results should be correlated with clinical presentation.   Magnesium     Status: Abnormal   Collection Time: 01/04/17  1:53 PM  Result Value Ref Range   Magnesium 1.3 (L) 1.7 - 2.4 mg/dL  Phosphorus     Status: Abnormal   Collection Time: 01/04/17  1:53 PM  Result Value Ref Range   Phosphorus 2.4 (L) 2.5 - 4.6 mg/dL  Glucose, capillary     Status: Abnormal   Collection Time: 01/04/17  4:49 PM  Result Value Ref Range   Glucose-Capillary 134 (H) 65 - 99 mg/dL  Troponin I (q 6hr x 3)     Status: Abnormal   Collection Time: 01/04/17  7:21 PM  Result Value Ref Range   Troponin I 0.04 (HH) <0.03 ng/mL    Comment: CRITICAL RESULT CALLED TO, READ BACK BY AND VERIFIED WITH: A WADE,RN 2023 01/04/17 D BRADLEY   Glucose, capillary     Status: Abnormal   Collection Time: 01/04/17  8:30 PM  Result Value Ref Range   Glucose-Capillary 169 (H) 65 - 99 mg/dL  Troponin I (q 6hr x 3)     Status: None   Collection Time: 01/05/17  1:38 AM  Result Value Ref Range   Troponin I <0.03 <0.03 ng/mL  Heparin level (unfractionated)     Status: None   Collection Time: 01/05/17  1:38 AM  Result Value Ref Range   Heparin Unfractionated 0.49 0.30 - 0.70 IU/mL    Comment:        IF HEPARIN RESULTS ARE BELOW EXPECTED VALUES, AND PATIENT DOSAGE HAS BEEN CONFIRMED, SUGGEST FOLLOW UP TESTING OF ANTITHROMBIN III LEVELS.   CBC     Status: Abnormal   Collection Time: 01/05/17  1:38 AM  Result Value Ref Range   WBC 9.2 4.0 - 10.5 K/uL   RBC 4.14 3.87 - 5.11 MIL/uL    Hemoglobin 11.6 (L) 12.0 - 15.0 g/dL   HCT 35.6 (L) 36.0 - 46.0 %   MCV 86.0 78.0 - 100.0 fL   MCH 28.0 26.0 - 34.0 pg   MCHC 32.6 30.0 - 36.0 g/dL   RDW 13.8 11.5 - 15.5 %   Platelets 198 150 - 400 K/uL  Glucose, capillary     Status: Abnormal   Collection Time: 01/05/17  6:14 AM  Result Value Ref Range   Glucose-Capillary 109 (H) 65 - 99 mg/dL  Troponin I (q 6hr x 3)     Status: None   Collection Time: 01/05/17  7:26 AM  Result Value Ref Range   Troponin I <0.03 <0.03 ng/mL  Hemoglobin A1c     Status: Abnormal   Collection Time: 01/05/17  9:40 AM  Result Value Ref Range   Hgb A1c MFr Bld 8.2 (H) 4.8 - 5.6 %    Comment: (NOTE)         Pre-diabetes: 5.7 - 6.4         Diabetes: >6.4         Glycemic control for adults with diabetes: <7.0    Mean Plasma Glucose 189 mg/dL    Comment: (NOTE) Performed At: [facility] [address] [place], [place] [id number] Lindon Romp MD [id number]   Glucose, capillary     Status: None   Collection Time: 01/05/17 11:06 AM  Result Value  Ref Range   Glucose-Capillary 92 65 - 99 mg/dL   Comment 1 Notify RN    Comment 2 Document in Chart   Heparin level (unfractionated)     Status: None   Collection Time: 01/05/17 11:07 AM  Result Value Ref Range   Heparin Unfractionated 0.54 0.30 - 0.70 IU/mL    Comment:        IF HEPARIN RESULTS ARE BELOW EXPECTED VALUES, AND PATIENT DOSAGE HAS BEEN CONFIRMED, SUGGEST FOLLOW UP TESTING OF ANTITHROMBIN III LEVELS.   Urinalysis, Routine w reflex microscopic     Status: Abnormal   Collection Time: 01/05/17 12:15 PM  Result Value Ref Range   Color, Urine YELLOW YELLOW   APPearance CLOUDY (A) CLEAR   Specific Gravity, Urine 1.008 1.005 - 1.030   pH 7.0 5.0 - 8.0   Glucose, UA 50 (A) NEGATIVE mg/dL   Hgb urine dipstick MODERATE (A) NEGATIVE   Bilirubin Urine NEGATIVE NEGATIVE   Ketones, ur NEGATIVE NEGATIVE mg/dL   Protein, ur NEGATIVE NEGATIVE mg/dL   Nitrite NEGATIVE NEGATIVE    Leukocytes, UA NEGATIVE NEGATIVE   RBC / HPF 6-30 0 - 5 RBC/hpf   WBC, UA 0-5 0 - 5 WBC/hpf   Bacteria, UA FEW (A) NONE SEEN   Squamous Epithelial / LPF 6-30 (A) NONE SEEN  Glucose, capillary     Status: Abnormal   Collection Time: 01/05/17  4:21 PM  Result Value Ref Range   Glucose-Capillary 161 (H) 65 - 99 mg/dL  Glucose, capillary     Status: None   Collection Time: 01/05/17  9:09 PM  Result Value Ref Range   Glucose-Capillary 84 65 - 99 mg/dL  CBC     Status: Abnormal   Collection Time: 01/06/17  4:12 AM  Result Value Ref Range   WBC 7.6 4.0 - 10.5 K/uL   RBC 3.93 3.87 - 5.11 MIL/uL   Hemoglobin 10.8 (L) 12.0 - 15.0 g/dL   HCT 33.9 (L) 36.0 - 46.0 %   MCV 86.3 78.0 - 100.0 fL   MCH 27.5 26.0 - 34.0 pg   MCHC 31.9 30.0 - 36.0 g/dL   RDW 13.9 11.5 - 15.5 %   Platelets 185 150 - 400 K/uL  Comprehensive metabolic panel     Status: Abnormal   Collection Time: 01/06/17  4:12 AM  Result Value Ref Range   Sodium 140 135 - 145 mmol/L   Potassium 4.2 3.5 - 5.1 mmol/L   Chloride 110 101 - 111 mmol/L   CO2 23 22 - 32 mmol/L   Glucose, Bld 127 (H) 65 - 99 mg/dL   BUN 21 (H) 6 - 20 mg/dL   Creatinine, Ser 0.99 0.44 - 1.00 mg/dL   Calcium 8.6 (L) 8.9 - 10.3 mg/dL   Total Protein 5.8 (L) 6.5 - 8.1 g/dL   Albumin 3.1 (L) 3.5 - 5.0 g/dL   AST 24 15 - 41 U/L   ALT 18 14 - 54 U/L   Alkaline Phosphatase 51 38 - 126 U/L   Total Bilirubin 0.4 0.3 - 1.2 mg/dL   GFR calc non Af Amer 52 (L) >60 mL/min   GFR calc Af Amer 60 (L) >60 mL/min    Comment: (NOTE) The eGFR has been calculated using the CKD EPI equation. This calculation has not been validated in all clinical situations. eGFR's persistently <60 mL/min signify possible Chronic Kidney Disease.    Anion gap 7 5 - 15  Glucose, capillary     Status: Abnormal  Collection Time: 01/06/17  5:13 AM  Result Value Ref Range   Glucose-Capillary 124 (H) 65 - 99 mg/dL  Glucose, capillary     Status: Abnormal   Collection Time: 01/06/17   6:05 AM  Result Value Ref Range   Glucose-Capillary 118 (H) 65 - 99 mg/dL     Lipid Panel     Component Value Date/Time   CHOL 152 05/16/2016 0508   TRIG 283 (H) 05/16/2016 0508   HDL 38 (L) 05/16/2016 0508   CHOLHDL 4.0 05/16/2016 0508   VLDL 57 (H) 05/16/2016 0508   LDLCALC 57 05/16/2016 0508     Lab Results  Component Value Date   HGBA1C 8.2 (H) 01/05/2017   HGBA1C 7.8 (H) 05/15/2016   HGBA1C 10.0 (H) 01/31/2015        HPI :   83 y.o.femalewith medical history significant of insomnia, diabetes, diabetic neuropathy, GERD, hyperlipidemia, C KD, hypertension, peripheral edema. Patient presenting directly from cardiology office, Dr Einar Gip, due to symptomatic tachycardia. Patient with a sustained heart rate at rest anywhere from the 120s to 140s with significant subjective complaints especially with ambulation. Patient admitted for orthostatic hypotension and sinus tachycardia workup.  HOSPITAL COURSE:   Sinus Tachycardia:  Pt sent from Dr. Irven Shelling office as direct admission. Currently in sinus. Found to be orthostatic on admission  Concern for betablocker rebound after stopping 7 days prior (now w/ 4-5 days of sx) vs PE vs severe volume depletion.  d-dimer 0.93,  ,VQ scan negative, venous Doppler pending................................ Pt extremely orthostatic despite stopping all recent BP meds and starting midodrine (discussion below).  Resumed propranolol  01/05/17  (81m BID), tachycardia improving  Troponin negative, telemetry shows sinus tachycardia CTA if not improving after lopressor or if trop significantly elevated,   Thyroid studies within normal limits Most recent 2-D echo 12/25/16-LV EF: 60% - 65%.Systolic pressure was mildly increased. PA peak pressure: 37 mm Hg (S). Okay to discharge as per cardiology recommendations  Orthostatic Hypotension: persistent despite stopping home BP meds (lisinopril, chlorthalidone, clonidine, propranolol). [facility]. Admission orthostatics- BP drops from 142/71 lying to 131/79 sitting to 100/57 standing. Per last admission pt improved w/ fluids (also had AKI) and initiation of tapering off her cymbalta. Pt started on midodrine at time of discharge , but at significantly low dose Hydrated with normal saline Increased  Midodrine to 143m3 times a day Repeat orthostatics negative   CKD: Cr 1.16. Difficult to identify pt baseline as Cr on 05/2016 was 0.94 w/ initial elevation to 1.5 in January and subsequent downtrend.  Follow kidney function closely  DM: Check hemoglobin A1c - Stable Accu-Cheks, continue  Humalog 75/25 - SSI  DM neuropathy: - continue neurontin, changed to qhs   Depression: recently stopped cymbalta. Doing well - monitor  Hypomagnesemia-replete. And started on magnesium supplementation    Discharge Exam:   Blood pressure (!) 156/73, pulse 72, temperature 97.7 F (36.5 C), temperature source Oral, resp. rate 17, height _0  (1.727 m), weight 104 kg (229 lb 3.2 oz), SpO2 94 %.        Signed: Reyne Dumas/10/2016, 9:28 AM        Time spent >45 mins

## 2017-01-06 NOTE — Progress Notes (Signed)
Subjective:  Doing well, states that her dizziness has improved significantly.  Dyspnea has also improved.  States that she is ready to go home.  Objective:  Vital Signs in the last 24 hours: Temp:  [97.7 F (36.5 C)-98.1 F (36.7 C)] 97.7 F (36.5 C) (05/02 0345) Pulse Rate:  [72-89] 72 (05/02 0345) Resp:  [17-18] 17 (05/02 0345) BP: (135-156)/(48-73) 156/73 (05/02 0345) SpO2:  [94 %-97 %] 94 % (05/02 0345) Weight:  [104 kg (229 lb 3.2 oz)] 104 kg (229 lb 3.2 oz) (05/02 0345)   Physical Exam:  Orthostatic VS for the past 24 hrs:  BP- Lying Pulse- Lying BP- Sitting Pulse- Sitting BP- Standing at 0 minutes Pulse- Standing at 0 minutes  01/06/17 0750 161/70 77 162/65 76 166/72 84    General appearance: alert, cooperative, appears stated age and no distress Neck: no adenopathy, no carotid bruit, no JVD, supple, symmetrical, trachea midline and thyroid not enlarged, symmetric, no tenderness/mass/nodules Lungs: clear to auscultation bilaterally Heart: S1 and S2 normal. Distant heart sounds.  No murmur or gallop appreciated. Abdomen: soft, non-tender; bowel sounds normal; no masses,  no organomegaly and Mild pannus present. Extremities: extremities normal, atraumatic, no cyanosis or edema and Large adipose tissue present. Pulses: 2+ and symmetric Neurologic: Grossly normal  Lab Results: BMP  Recent Labs  12/25/16 0457 01/04/17 1353 01/06/17 0412  NA 139 138 140  K 3.7 3.8 4.2  CL 102 103 110  CO2 26 24 23   GLUCOSE 114* 98 127*  BUN 25* 27* 21*  CREATININE 1.16* 1.19* 0.99  CALCIUM 9.5 9.5 8.6*  GFRNONAA 43* 41* 52*  GFRAA 49* 48* 60*    CBC  Recent Labs Lab 01/06/17 0412  WBC 7.6  RBC 3.93  HGB 10.8*  HCT 33.9*  PLT 185  MCV 86.3  MCH 27.5  MCHC 31.9  RDW 13.9    HEMOGLOBIN A1C Lab Results  Component Value Date   HGBA1C 8.2 (H) 01/05/2017   MPG 189 01/05/2017    Cardiac Panel (last 3 results)  Recent Labs  01/04/17 1921 01/05/17 0138  01/05/17 0726  TROPONINI 0.04* <0.03 <0.03    BNP (last 3 results) No results for input(s): PROBNP in the last 8760 hours.  TSH  Recent Labs  05/16/16 0508 01/04/17 1353  TSH 5.394* 3.097    Lipid Panel     Component Value Date/Time   CHOL 152 05/16/2016 0508   TRIG 283 (H) 05/16/2016 0508   HDL 38 (L) 05/16/2016 0508   CHOLHDL 4.0 05/16/2016 0508   VLDL 57 (H) 05/16/2016 0508   LDLCALC 57 05/16/2016 0508     Hepatic Function Panel  Recent Labs  01/04/17 1353 01/06/17 0412  PROT 7.0 5.8*  ALBUMIN 3.8 3.1*  AST 31 24  ALT 22 18  ALKPHOS 56 51  BILITOT 0.7 0.4    Imaging: Imaging results have been reviewed  Cardiac Studies:  EKG 01/04/17: Sinus tachycardia at the rate of 113 bpm, left axis deviation, LVH.  Poor R-wave progression, cannot exclude anterior infarct old.  Normal QT interval.  No evidence of ischemia  Echo: 12/25/2016:Left ventricle: The cavity size was normal. Wall thickness was normal. Systolic function was normal. The estimated ejectionfraction was in the range of 60% to 65%. Wall motion was normal;there were no regional wall motion abnormalities. Dopplerparameters are consistent with abnormal left ventricularrelaxation (grade 1 diastolic dysfunction). Pulmonary arteries: Systolic pressure was mildly increased. PApeak pressure: 37 mm Hg (S).  Assessment/Plan:  1. Orthostatic hypotension  due to diabetic autonomic neuropathy 2. Dyspnea on exertion due to  Obesity and chronic diastolic HF 3. Essential hypertension. 4. CKD stage 3  Rec: Suspect her symptoms were related to acute withdrawal of her progress and also Cymbalta recently along with performed orthostatic hypotension which is due to autonomic insufficiency.  She is advised to wear support stockings on a daily basis, agree with increasing the midodrine and to 10 mg 3 times a day while awake.  Also patient recommended head up position to avoid supine hypertension.  She appears to  be stable for discharge.  I will see her back in the office in 10 days to 2 weeks for follow-up.  Agree with continuing low-dose of beta blocker which is helped her tremors and also essential hypertension.   Adrian Prows, M.D. 01/06/2017, 9:52 AM [facility] Pager: [phone] Office: [phone] If no answer: [phone]

## 2017-01-25 ENCOUNTER — Telehealth: Payer: Self-pay | Admitting: Neurology

## 2017-01-25 MED ORDER — DULOXETINE HCL 20 MG PO CPEP
20.0000 mg | ORAL_CAPSULE | Freq: Every day | ORAL | 3 refills | Status: DC
Start: 2017-01-25 — End: 2017-06-02

## 2017-01-25 NOTE — Telephone Encounter (Signed)
I called patient. The patient is having ongoing back pain, the Cymbalta did help her back pain, she was taken off of for concerns of orthostatic hypotension. We will retry a low dose of 20 mg daily to see if this is helpful. She will monitor the blood pressure issue.

## 2017-01-25 NOTE — Addendum Note (Signed)
Addended by: Kathrynn Ducking on: 01/25/2017 12:50 PM   Modules accepted: Orders

## 2017-01-25 NOTE — Telephone Encounter (Signed)
Pt calling re: DULoxetine (CYMBALTA) 30 MG capsule  she is wanting to know if a refill of this can be called in to aid with her pain (since it is the only thing to help with pain) instead of 30 mg she is requesting 20 mg, please call

## 2017-02-15 ENCOUNTER — Ambulatory Visit: Payer: Medicare Other | Admitting: Adult Health

## 2017-05-06 ENCOUNTER — Emergency Department (HOSPITAL_COMMUNITY)
Admission: EM | Admit: 2017-05-06 | Discharge: 2017-05-06 | Disposition: A | Discharge status: Home / Self Care | Payer: Medicare Other | Attending: Emergency Medicine | Admitting: Emergency Medicine

## 2017-05-06 ENCOUNTER — Encounter (HOSPITAL_COMMUNITY): Payer: Self-pay | Admitting: Emergency Medicine

## 2017-05-06 ENCOUNTER — Emergency Department (HOSPITAL_COMMUNITY): Payer: Medicare Other

## 2017-05-06 DIAGNOSIS — Z85828 Personal history of other malignant neoplasm of skin: Secondary | ICD-10-CM | POA: Diagnosis not present

## 2017-05-06 DIAGNOSIS — N183 Chronic kidney disease, stage 3 (moderate): Secondary | ICD-10-CM | POA: Insufficient documentation

## 2017-05-06 DIAGNOSIS — Z96651 Presence of right artificial knee joint: Secondary | ICD-10-CM | POA: Diagnosis not present

## 2017-05-06 DIAGNOSIS — I129 Hypertensive chronic kidney disease with stage 1 through stage 4 chronic kidney disease, or unspecified chronic kidney disease: Secondary | ICD-10-CM | POA: Insufficient documentation

## 2017-05-06 DIAGNOSIS — M545 Low back pain, unspecified: Secondary | ICD-10-CM

## 2017-05-06 DIAGNOSIS — E1122 Type 2 diabetes mellitus with diabetic chronic kidney disease: Secondary | ICD-10-CM | POA: Diagnosis not present

## 2017-05-06 DIAGNOSIS — Z79899 Other long term (current) drug therapy: Secondary | ICD-10-CM | POA: Diagnosis not present

## 2017-05-06 MED ORDER — MORPHINE SULFATE (PF) 4 MG/ML IV SOLN
4.0000 mg | Freq: Once | INTRAVENOUS | Status: AC
  Administered 2017-05-06: 4 mg via INTRAVENOUS
  Filled 2017-05-06: qty 1

## 2017-05-06 MED ORDER — TRAMADOL HCL 50 MG PO TABS: 50.0000 mg | ORAL_TABLET | Freq: Four times a day (QID) | ORAL | 0 refills | Status: DC | PRN

## 2017-05-06 MED ORDER — ONDANSETRON 4 MG PO TBDP: 4.0000 mg | ORAL_TABLET | Freq: Three times a day (TID) | ORAL | 0 refills | Status: DC | PRN

## 2017-05-06 NOTE — ED Provider Notes (Signed)
[facility] DEPT Provider Note   CSN: [id number] Arrival date & time: 05/06/17  1142     History   Chief Complaint Chief Complaint  Patient presents with  . Fall  . Hip Pain    HPI [name] is a 83 y.o. female.  HPI Patient reports slipping and falling today while stepping out of the shower.  She fell and landed on her bottom and low back.  She complains of severe low back pain at this time without weakness or numbness in her lower extremities.  No head injury or headache.  Denies neck pain.  No paresthesias or weakness of her upper extremities.  Denies chest pain shortness of breath.  Symptoms are moderate in severity and worse with movement in regards to the pain in her low back.  She also reports pain in her bilateral hips    Past Medical History:  Diagnosis Date  . Anemia    as a teenager  . Arthritis   . Cancer ([place])    skin  . Cholelithiasis   . Chronic insomnia   . Chronic low back pain   . Complication of anesthesia   . DDD (degenerative disc disease), lumbar   . Diabetes mellitus    Iddm x 12 years  . Diabetic peripheral neuropathy ([place])   . Essential tremor 04/01/2015  . GERD (gastroesophageal reflux disease)   . Hx of acute renal failure 02/2014   admitted to [facility]  . Hx: UTI (urinary tract infection) 02/2014  . Hyperlipemia   . Hypertension   . Obesity   . Peripheral edema   . Pneumonia    as a child  . PONV (postoperative nausea and vomiting)    pt states Zofran not that helpful in the past, Phenergan works best for her  . Staph infection    1970's  . Umbilical hernia   . Wrist fracture    left October 2014    Patient Active Problem List   Diagnosis Date Noted  . Syncope 01/05/2017  . Tachycardia 01/04/2017  . CKD (chronic kidney disease) stage 3, GFR 30-59 ml/min 01/04/2017  . Diabetes mellitus with complication ([place]) 52/84/1324  . Peripheral neuropathy 01/04/2017  . Orthostatic hypotension 01/04/2017  . Fall 12/24/2016  .  Acute encephalopathy 12/24/2016  . Depression 12/24/2016  . Acute renal failure superimposed on stage 3 chronic kidney disease ([place]) 12/24/2016  . Vertigo 05/16/2016  . Chest pain 05/15/2016  . Essential tremor 04/01/2015  . Chronic low back pain 04/01/2015  . Lumbar herniated disc 02/06/2015  . Lumbar radiculopathy 03/20/2014  . UTI (lower urinary tract infection) 03/18/2014  . Hyperglycemia 03/18/2014  . Dyslipidemia 03/18/2014  . Lumbar stenosis 03/16/2014  . HTN (hypertension) 02/12/2014  . Diabetes mellitus ([place]) 02/12/2014  . Acute renal failure ([place]) 02/11/2014  . H/O total knee replacement 08/11/2013  . Gallstones 10/01/2011    Past Surgical History:  Procedure Laterality Date  . ABDOMINAL HYSTERECTOMY    . BACK SURGERY  2005  . CARDIAC CATHETERIZATION  2000   Dr Einar Gip  . CATARACT EXTRACTION Bilateral    w/ lens implants  . COLONOSCOPY  >10 years  . EYE SURGERY    . HERNIA REPAIR    . LUMBAR LAMINECTOMY/DECOMPRESSION MICRODISCECTOMY Left 03/16/2014   Procedure: LUMBAR LAMINECTOMY/DECOMPRESSION MICRODISCECTOMY 1 LEVEL  lumbar level three/four;  Surgeon: [name], MD;  Location: [facility];  Service: Neurosurgery;  Laterality: Left;  . LUMBAR LAMINECTOMY/DECOMPRESSION MICRODISCECTOMY Right 02/06/2015   Procedure: Right L4-5  Microdiskectomy;  Surgeon: Leeroy Cha, MD;  Location: [facility];  Service: Neurosurgery;  Laterality: Right;  Right L4-5 Microdiskectomy  . NECK SURGERY    . OOPHORECTOMY    . TOTAL KNEE ARTHROPLASTY Right     OB History    No data available       Home Medications    Prior to Admission medications   Medication Sig Start Date End Date Taking? Authorizing Provider  DULoxetine (CYMBALTA) 20 MG capsule Take 1 capsule (20 mg total) by mouth daily. 01/25/17   Kathrynn Ducking, MD  gabapentin (NEURONTIN) 400 MG capsule Take 1 capsule (400 mg total) by mouth at bedtime. 01/06/17   Reyne Dumas, MD  HUMALOG MIX 75/25 KWIKPEN (75-25) 100  UNIT/ML Kwikpen Inject 25-50 Units into the skin 2 (two) times daily. Inject 50 units in the am and Inject 25 in the pm. 06/04/15   [provider]  losartan (COZAAR) 50 MG tablet Take 50 mg by mouth daily. 04/28/17   [provider]  midodrine (PROAMATINE) 10 MG tablet Take 1 tablet (10 mg total) by mouth 3 (three) times daily with meals. Patient not taking: Reported on 05/06/2017 01/06/17   Reyne Dumas, MD  midodrine (PROAMATINE) 5 MG tablet Take 5 mg by mouth 2 (two) times daily. 05/03/17   [provider]  propranolol (INDERAL) 20 MG tablet Take 1 tablet (20 mg total) by mouth 2 (two) times daily. 01/06/17 02/05/17  Reyne Dumas, MD  TRADJENTA 5 MG TABS tablet Take 5 mg by mouth daily. 07/24/13   [provider]    Family History Family History  Problem Relation Age of Onset  . Cancer Brother        prostate  . Heart disease Brother   . Alzheimer's disease Brother   . Stroke Mother   . Hypertension Mother   . COPD Brother   . Cancer Brother        Prostate cancer  . Hypertension Sister        Congestive heart failure  . Heart attack Son     Social History Social History  Substance Use Topics  . Smoking status: Never Smoker  . Smokeless tobacco: Never Used  . Alcohol use No     Allergies   Ciprofloxacin; Codeine; Dilaudid [hydromorphone hcl]; Mysoline [primidone]; and Oxycodone   Review of Systems Review of Systems  All other systems reviewed and are negative.    Physical Exam Updated Vital Signs BP (!) 188/86 (BP Location: Left Arm) Comment: Pt reports not having any BP medications today  Pulse 80   Temp 98.2 F (36.8 C) (Oral)   Resp 18   Wt 99.3 kg (219 lb)   SpO2 91%   BMI 33.30 kg/m   Physical Exam  Constitutional: She is oriented to person, place, and time. She appears well-developed and well-nourished.  HENT:  Head: Normocephalic and atraumatic.  Eyes: EOM are normal.  Neck: Normal range of motion. Neck supple.    C-spine nontender.  C-spine cleared by Nexus criteria  Pulmonary/Chest: Effort normal.  Abdominal: She exhibits no distension.  Musculoskeletal: Normal range of motion.  Full range of motion of bilateral shoulders, elbows and wrists. Full range of motion of bilateral hips, knees and ankles. Mild lumbar tenderness without obvious lumbar step-off.  No thoracic tenderness.normal strength in bilateral upper and lower extremity major muscle groups   Neurological: She is alert and oriented to person, place, and time.  Psychiatric: She has a normal mood and  affect.  Nursing note and vitals reviewed.    ED Treatments / Results  Labs (all labs ordered are listed, but only abnormal results are displayed) Labs Reviewed - No data to display  EKG  EKG Interpretation None       Radiology Dg Hips Bilat W Or Wo Pelvis 5 Views  Result Date: 05/06/2017 CLINICAL DATA:  83 year old female status post fall with bilateral hip pain. EXAM: DG HIP (WITH OR WITHOUT PELVIS) 5+V BILAT COMPARISON:  None. FINDINGS: There is no evidence of hip fracture or dislocation. There is no evidence of arthropathy or other focal bone abnormality. Soft tissues and visceral contours unremarkable. IMPRESSION: No evidence for acute hip fracture or dislocation. Electronically Signed   By: Kristopher Oppenheim M.D.   On: 05/06/2017 15:23    Procedures Procedures (including critical care time)  Medications Ordered in ED Medications  morphine 4 MG/ML injection 4 mg (4 mg Intravenous Given 05/06/17 1623)     Initial Impression / Assessment and Plan / ED Course  I have reviewed the triage vital signs and the nursing notes.  Pertinent labs & imaging results that were available during my care of the patient were reviewed by me and considered in my medical decision making (see chart for details).     Patient will undergo imaging of her low back.  Pain treated at this time.  C-spine cleared by Nexus criteria.  No indication for  imaging of the head.  Chest and abdomen are benign  Final Clinical Impressions(s) / ED Diagnoses   Final diagnoses:  None    New Prescriptions New Prescriptions   No medications on file     Jola Schmidt, MD 05/07/17 [phone]

## 2017-05-06 NOTE — ED Notes (Signed)
When trying to ambulate pt to bathroom pt and family member stated that pt is not safe to walk and that was the purpose of coming to the ED.

## 2017-05-06 NOTE — ED Provider Notes (Signed)
Assumed care from Dr. Venora Maples at 5 PM. Briefly, the patient is a 83 y.o. female with PMHx of  has a past medical history of Anemia; Arthritis; Cancer ([place]); Cholelithiasis; Chronic insomnia; Chronic low back pain; Complication of anesthesia; DDD (degenerative disc disease), lumbar; Diabetes mellitus; Diabetic peripheral neuropathy ([place]); Essential tremor (04/01/2015); GERD (gastroesophageal reflux disease); acute renal failure (02/2014); UTI (urinary tract infection) (02/2014); Hyperlipemia; Hypertension; Obesity; Peripheral edema; Pneumonia; PONV (postoperative nausea and vomiting); Staph infection; Umbilical hernia; and Wrist fracture. here with mild back pain s/p fall. Fall was mechanical in nature. She has moderate low back pain but no LE weakness, numbness, or s/s cauda equina. Pt is o/w well appearing, here with family. Plain films of L Spine are pending. Suspect pt can be managed as outpt with pain control.   Labs Reviewed - No data to display  Course of Care: -Plain films show old compression fractures but no acute changes. Pain is improved and patient is in that were in the ED. Will give the patient a short course of Ultram. She is on one SSRI but has tolerated Ultram in the past. She is unable to tolerate other analgesics and I feel the benefits outweigh the risks, as patient will need to be able to work. I discussed this with her family, who will stay with her tonight. Return precautions given.      Duffy Bruce, MD 05/06/17 [phone]

## 2017-05-06 NOTE — ED Triage Notes (Addendum)
Pt c/o pain in low back and bilateral hip pain. Pt stated that she slipped and fell after stepping out of shower. Stated that landed straight down on her buttocks.  Transported from [employer]. Pt has an apartment in an independent living facility. Pt is alert , oriented and appropriate. Pt was able to stand and get assistance. Granddaughter with pt

## 2017-05-06 NOTE — ED Triage Notes (Signed)
Per EMS-states mechanical fall in the shower, states complaining of back pain-did not hit head, no LOC

## 2017-05-06 NOTE — ED Notes (Signed)
Pt wheeled to daughter's vehicle.  Verbalized understanding of discharge instructions.

## 2017-05-14 ENCOUNTER — Other Ambulatory Visit: Payer: Self-pay

## 2017-05-14 ENCOUNTER — Encounter (HOSPITAL_COMMUNITY): Payer: Self-pay

## 2017-05-14 ENCOUNTER — Observation Stay (HOSPITAL_COMMUNITY)
Admission: EM | Admit: 2017-05-14 | Discharge: 2017-05-16 | Disposition: A | Discharge status: Skilled Nursing Facility | Payer: Medicare Other | Attending: Internal Medicine | Admitting: Internal Medicine

## 2017-05-14 ENCOUNTER — Emergency Department (HOSPITAL_COMMUNITY): Payer: Medicare Other

## 2017-05-14 DIAGNOSIS — M5442 Lumbago with sciatica, left side: Secondary | ICD-10-CM

## 2017-05-14 DIAGNOSIS — Z79899 Other long term (current) drug therapy: Secondary | ICD-10-CM | POA: Diagnosis not present

## 2017-05-14 DIAGNOSIS — M545 Low back pain: Secondary | ICD-10-CM | POA: Diagnosis present

## 2017-05-14 DIAGNOSIS — N179 Acute kidney failure, unspecified: Secondary | ICD-10-CM | POA: Insufficient documentation

## 2017-05-14 DIAGNOSIS — G250 Essential tremor: Secondary | ICD-10-CM | POA: Insufficient documentation

## 2017-05-14 DIAGNOSIS — S32019A Unspecified fracture of first lumbar vertebra, initial encounter for closed fracture: Principal | ICD-10-CM | POA: Insufficient documentation

## 2017-05-14 DIAGNOSIS — W010XXA Fall on same level from slipping, tripping and stumbling without subsequent striking against object, initial encounter: Secondary | ICD-10-CM | POA: Insufficient documentation

## 2017-05-14 DIAGNOSIS — G8929 Other chronic pain: Secondary | ICD-10-CM

## 2017-05-14 DIAGNOSIS — I129 Hypertensive chronic kidney disease with stage 1 through stage 4 chronic kidney disease, or unspecified chronic kidney disease: Secondary | ICD-10-CM

## 2017-05-14 DIAGNOSIS — M4856XA Collapsed vertebra, not elsewhere classified, lumbar region, initial encounter for fracture: Secondary | ICD-10-CM

## 2017-05-14 DIAGNOSIS — I130 Hypertensive heart and chronic kidney disease with heart failure and stage 1 through stage 4 chronic kidney disease, or unspecified chronic kidney disease: Secondary | ICD-10-CM | POA: Diagnosis not present

## 2017-05-14 DIAGNOSIS — E785 Hyperlipidemia, unspecified: Secondary | ICD-10-CM | POA: Insufficient documentation

## 2017-05-14 DIAGNOSIS — K219 Gastro-esophageal reflux disease without esophagitis: Secondary | ICD-10-CM | POA: Insufficient documentation

## 2017-05-14 DIAGNOSIS — Z794 Long term (current) use of insulin: Secondary | ICD-10-CM | POA: Diagnosis not present

## 2017-05-14 DIAGNOSIS — Z96651 Presence of right artificial knee joint: Secondary | ICD-10-CM | POA: Diagnosis not present

## 2017-05-14 DIAGNOSIS — G9340 Encephalopathy, unspecified: Secondary | ICD-10-CM

## 2017-05-14 DIAGNOSIS — W19XXXA Unspecified fall, initial encounter: Secondary | ICD-10-CM | POA: Insufficient documentation

## 2017-05-14 DIAGNOSIS — M5116 Intervertebral disc disorders with radiculopathy, lumbar region: Secondary | ICD-10-CM | POA: Insufficient documentation

## 2017-05-14 DIAGNOSIS — M8588 Other specified disorders of bone density and structure, other site: Secondary | ICD-10-CM | POA: Insufficient documentation

## 2017-05-14 DIAGNOSIS — M48061 Spinal stenosis, lumbar region without neurogenic claudication: Secondary | ICD-10-CM | POA: Insufficient documentation

## 2017-05-14 DIAGNOSIS — R32 Unspecified urinary incontinence: Secondary | ICD-10-CM | POA: Insufficient documentation

## 2017-05-14 DIAGNOSIS — F410 Panic disorder [episodic paroxysmal anxiety] without agoraphobia: Secondary | ICD-10-CM | POA: Insufficient documentation

## 2017-05-14 DIAGNOSIS — N183 Chronic kidney disease, stage 3 (moderate): Secondary | ICD-10-CM | POA: Insufficient documentation

## 2017-05-14 DIAGNOSIS — Z6833 Body mass index (BMI) 33.0-33.9, adult: Secondary | ICD-10-CM | POA: Diagnosis not present

## 2017-05-14 DIAGNOSIS — Z981 Arthrodesis status: Secondary | ICD-10-CM | POA: Insufficient documentation

## 2017-05-14 DIAGNOSIS — Z87440 Personal history of urinary (tract) infections: Secondary | ICD-10-CM

## 2017-05-14 DIAGNOSIS — I951 Orthostatic hypotension: Secondary | ICD-10-CM | POA: Insufficient documentation

## 2017-05-14 DIAGNOSIS — E1165 Type 2 diabetes mellitus with hyperglycemia: Secondary | ICD-10-CM

## 2017-05-14 DIAGNOSIS — E669 Obesity, unspecified: Secondary | ICD-10-CM | POA: Insufficient documentation

## 2017-05-14 DIAGNOSIS — I5032 Chronic diastolic (congestive) heart failure: Secondary | ICD-10-CM | POA: Diagnosis not present

## 2017-05-14 DIAGNOSIS — E1142 Type 2 diabetes mellitus with diabetic polyneuropathy: Secondary | ICD-10-CM | POA: Diagnosis not present

## 2017-05-14 DIAGNOSIS — D631 Anemia in chronic kidney disease: Secondary | ICD-10-CM

## 2017-05-14 DIAGNOSIS — M25511 Pain in right shoulder: Secondary | ICD-10-CM | POA: Diagnosis not present

## 2017-05-14 DIAGNOSIS — E1122 Type 2 diabetes mellitus with diabetic chronic kidney disease: Secondary | ICD-10-CM | POA: Insufficient documentation

## 2017-05-14 DIAGNOSIS — M549 Dorsalgia, unspecified: Secondary | ICD-10-CM

## 2017-05-14 DIAGNOSIS — F329 Major depressive disorder, single episode, unspecified: Secondary | ICD-10-CM | POA: Diagnosis not present

## 2017-05-14 DIAGNOSIS — M5441 Lumbago with sciatica, right side: Secondary | ICD-10-CM

## 2017-05-14 LAB — I-STAT TROPONIN, ED: Troponin i, poc: 0 ng/mL (ref 0.00–0.08)

## 2017-05-14 LAB — URINALYSIS, ROUTINE W REFLEX MICROSCOPIC
Bilirubin Urine: NEGATIVE
Glucose, UA: >=500 mg/dL — AB
Hgb urine dipstick: NEGATIVE
Ketones, ur: NEGATIVE mg/dL
Leukocytes, UA: NEGATIVE
Nitrite: NEGATIVE
Protein, ur: 100 mg/dL — AB
Specific Gravity, Urine: 1.022 (ref 1.005–1.030)
pH: 6 (ref 5.0–8.0)

## 2017-05-14 LAB — CBG MONITORING, ED: Glucose-Capillary: 77 mg/dL (ref 65–99)

## 2017-05-14 LAB — COMPREHENSIVE METABOLIC PANEL
ALT: 12 U/L — ABNORMAL LOW (ref 14–54)
AST: 18 U/L (ref 15–41)
Albumin: 4.1 g/dL (ref 3.5–5.0)
Alkaline Phosphatase: 89 U/L (ref 38–126)
Anion gap: 9 (ref 5–15)
BUN: 22 mg/dL — ABNORMAL HIGH (ref 6–20)
CO2: 29 mmol/L (ref 22–32)
Calcium: 9.8 mg/dL (ref 8.9–10.3)
Chloride: 102 mmol/L (ref 101–111)
Creatinine, Ser: 0.85 mg/dL (ref 0.44–1.00)
GFR calc Af Amer: >60 mL/min (ref >60)
GFR calc non Af Amer: >60 mL/min (ref >60)
Glucose, Bld: 45 mg/dL — ABNORMAL LOW (ref 65–99)
Potassium: 3.8 mmol/L (ref 3.5–5.1)
Sodium: 140 mmol/L (ref 135–145)
Total Bilirubin: 0.7 mg/dL (ref 0.3–1.2)
Total Protein: 7.9 g/dL (ref 6.5–8.1)

## 2017-05-14 LAB — CBC WITH DIFFERENTIAL/PLATELET
Basophils Absolute: 0 10*3/uL (ref 0.0–0.1)
Basophils Relative: 0 %
Eosinophils Absolute: 0.2 10*3/uL (ref 0.0–0.7)
Eosinophils Relative: 2 %
HCT: 42.1 % (ref 36.0–46.0)
Hemoglobin: 14.3 g/dL (ref 12.0–15.0)
Lymphocytes Relative: 25 %
Lymphs Abs: 2.8 10*3/uL (ref 0.7–4.0)
MCH: 28 pg (ref 26.0–34.0)
MCHC: 34 g/dL (ref 30.0–36.0)
MCV: 82.4 fL (ref 78.0–100.0)
Monocytes Absolute: 1.1 10*3/uL — ABNORMAL HIGH (ref 0.1–1.0)
Monocytes Relative: 9 %
Neutro Abs: 7.2 10*3/uL (ref 1.7–7.7)
Neutrophils Relative %: 64 %
Platelets: 248 10*3/uL (ref 150–400)
RBC: 5.11 MIL/uL (ref 3.87–5.11)
RDW: 13.9 % (ref 11.5–15.5)
WBC: 11.2 10*3/uL — ABNORMAL HIGH (ref 4.0–10.5)

## 2017-05-14 LAB — LIPASE, BLOOD: Lipase: 22 U/L (ref 11–51)

## 2017-05-14 MED ORDER — FENTANYL CITRATE (PF) 100 MCG/2ML IJ SOLN
50.0000 ug | Freq: Once | INTRAMUSCULAR | Status: AC
  Administered 2017-05-14: 50 ug via INTRAVENOUS
  Filled 2017-05-14: qty 2

## 2017-05-14 MED ORDER — MORPHINE SULFATE (PF) 4 MG/ML IV SOLN
4.0000 mg | Freq: Once | INTRAVENOUS | Status: AC
  Administered 2017-05-14: 4 mg via INTRAVENOUS
  Filled 2017-05-14: qty 1

## 2017-05-14 MED ORDER — DEXTROSE 50 % IV SOLN
1.0000 | Freq: Once | INTRAVENOUS | Status: AC
  Administered 2017-05-14: 50 mL via INTRAVENOUS
  Filled 2017-05-14: qty 50

## 2017-05-14 NOTE — ED Notes (Signed)
Pt not wanting 'Unnecessary tests' namely EKG

## 2017-05-14 NOTE — ED Notes (Signed)
ED Provider at bedside. 

## 2017-05-14 NOTE — ED Provider Notes (Signed)
[facility] DEPT Provider Note   CSN: [id number] Arrival date & time: 05/14/17  1347     History   Chief Complaint Chief Complaint  Patient presents with  . Fall  . Back Pain    HPI PRESLIE DEPASQUALE is a 83 y.o. female with PMHx Anemia; Arthritis; Cancer ([place]); Cholelithiasis; Chronic insomnia; Chronic low back pain; Complication of anesthesia; DDD (degenerative disc disease), lumbar; Diabetes mellitus; Diabetic peripheral neuropathy ([place]); Essential tremor (04/01/2015); GERD (gastroesophageal reflux disease); acute renal failure (02/2014); UTI (urinary tract infection) (02/2014); Hyperlipemia; Hypertension; Obesity; Peripheral edema; Pneumonia; PONV (postoperative nausea and vomiting); Staph infection; Umbilical hernia; and Wrist fracture who presents today with chief complaint acute onset, progressively worsening low back pain for 8 days. She states pain is intermittent and a burning sensation in her low back and radiates down the bilateral thighs. She also endorses right shoulder pain which is also intermittent and burning in sensation and radiates down her deltoid. She denies numbness, tingling, but does state that she feels weakness with relation to pain when walking. She was seen and evaluated 8 days ago in the emergency Department after a fall coming out of her shower. X-rays were reassuring and she was sent home with tramadol. She states that the tramadol was not helpful. She does endorse lower abdominal pain additionally. Denies fevers, chills, nausea, vomiting, diarrhea, melena, hematochezia, or constipation. She does endorse urinary incontinence which has worsened over the past 8 days. No stool incontinence. Denies any other urinary symptoms.  Denies IV drug use or saddle anesthesia. She does have a history of cervical spine fusion and 3 lumbar spine surgeries but does not have hardware in the lumbar spine. Also endorses a 20 pound unintentional weight loss in the past months. She is not on  any bloodthinners.  The history is provided by the patient.    Past Medical History:  Diagnosis Date  . Anemia    as a teenager  . Arthritis   . Cancer ([place])    skin  . Cholelithiasis   . Chronic insomnia   . Chronic low back pain   . Complication of anesthesia   . DDD (degenerative disc disease), lumbar   . Diabetes mellitus    Iddm x 12 years  . Diabetic peripheral neuropathy ([place])   . Essential tremor 04/01/2015  . GERD (gastroesophageal reflux disease)   . Hx of acute renal failure 02/2014   admitted to [facility]  . Hx: UTI (urinary tract infection) 02/2014  . Hyperlipemia   . Hypertension   . Obesity   . Peripheral edema   . Pneumonia    as a child  . PONV (postoperative nausea and vomiting)    pt states Zofran not that helpful in the past, Phenergan works best for her  . Staph infection    1970's  . Umbilical hernia   . Wrist fracture    left October 2014    Patient Active Problem List   Diagnosis Date Noted  . Syncope 01/05/2017  . Tachycardia 01/04/2017  . CKD (chronic kidney disease) stage 3, GFR 30-59 ml/min 01/04/2017  . Diabetes mellitus with complication ([place]) [birth year]1937  . Peripheral neuropathy 01/04/2017  . Orthostatic hypotension 01/04/2017  . Fall 12/24/2016  . Acute encephalopathy 12/24/2016  . Depression 12/24/2016  . Acute renal failure superimposed on stage 3 chronic kidney disease ([place]) 12/24/2016  . Vertigo 05/16/2016  . Chest pain 05/15/2016  . Essential tremor 04/01/2015  . Chronic low back pain 04/01/2015  .  Lumbar herniated disc 02/06/2015  . Lumbar radiculopathy 03/20/2014  . UTI (lower urinary tract infection) 03/18/2014  . Hyperglycemia 03/18/2014  . Dyslipidemia 03/18/2014  . Lumbar stenosis 03/16/2014  . HTN (hypertension) 02/12/2014  . Diabetes mellitus ([place]) 02/12/2014  . Acute renal failure ([place]) 02/11/2014  . H/O total knee replacement 08/11/2013  . Gallstones 10/01/2011    Past Surgical History:  Procedure  Laterality Date  . ABDOMINAL HYSTERECTOMY    . BACK SURGERY  2005  . CARDIAC CATHETERIZATION  2000   Dr Einar Gip  . CATARACT EXTRACTION Bilateral    w/ lens implants  . COLONOSCOPY  >10 years  . EYE SURGERY    . HERNIA REPAIR    . LUMBAR LAMINECTOMY/DECOMPRESSION MICRODISCECTOMY Left 03/16/2014   Procedure: LUMBAR LAMINECTOMY/DECOMPRESSION MICRODISCECTOMY 1 LEVEL  lumbar level three/four;  Surgeon: [name], MD;  Location: [facility];  Service: Neurosurgery;  Laterality: Left;  . LUMBAR LAMINECTOMY/DECOMPRESSION MICRODISCECTOMY Right 02/06/2015   Procedure: Right L4-5 Microdiskectomy;  Surgeon: Leeroy Cha, MD;  Location: [facility];  Service: Neurosurgery;  Laterality: Right;  Right L4-5 Microdiskectomy  . NECK SURGERY    . OOPHORECTOMY    . TOTAL KNEE ARTHROPLASTY Right     OB History    No data available       Home Medications    Prior to Admission medications   Medication Sig Start Date End Date Taking? Authorizing Provider  DULoxetine (CYMBALTA) 20 MG capsule Take 1 capsule (20 mg total) by mouth daily. 01/25/17  Yes Kathrynn Ducking, MD  HUMALOG MIX 75/25 KWIKPEN (75-25) 100 UNIT/ML Kwikpen Inject 15-60 Units into the skin 2 (two) times daily. Inject 60 units in the am and Inject 15 in the pm. 06/04/15  Yes [provider]  losartan (COZAAR) 50 MG tablet Take 50 mg by mouth daily. 04/28/17  Yes [provider]  midodrine (PROAMATINE) 5 MG tablet Take 5 mg by mouth 2 (two) times daily. 05/03/17  Yes [provider]  propranolol (INDERAL) 10 MG tablet Take 10 mg by mouth daily. 03/18/17  Yes [provider]  traMADol (ULTRAM) 50 MG tablet Take 1 tablet (50 mg total) by mouth every 6 (six) hours as needed for severe pain. 05/06/17  Yes Duffy Bruce, MD  gabapentin (NEURONTIN) 400 MG capsule Take 1 capsule (400 mg total) by mouth at bedtime. Patient not taking: Reported on 05/14/2017 01/06/17   Reyne Dumas, MD  midodrine (PROAMATINE) 10 MG  tablet Take 1 tablet (10 mg total) by mouth 3 (three) times daily with meals. Patient not taking: Reported on 05/06/2017 01/06/17   Reyne Dumas, MD  ondansetron (ZOFRAN ODT) 4 MG disintegrating tablet Take 1 tablet (4 mg total) by mouth every 8 (eight) hours as needed for nausea or vomiting. Patient not taking: Reported on 05/14/2017 05/06/17   Duffy Bruce, MD  propranolol (INDERAL) 20 MG tablet Take 1 tablet (20 mg total) by mouth 2 (two) times daily. 01/06/17 02/05/17  Reyne Dumas, MD    Family History Family History  Problem Relation Age of Onset  . Cancer Brother        prostate  . Heart disease Brother   . Alzheimer's disease Brother   . Stroke Mother   . Hypertension Mother   . COPD Brother   . Cancer Brother        Prostate cancer  . Hypertension Sister        Congestive heart failure  . Heart attack Son  Social History Social History  Substance Use Topics  . Smoking status: Never Smoker  . Smokeless tobacco: Never Used  . Alcohol use No     Allergies   Ciprofloxacin; Codeine; Dilaudid [hydromorphone hcl]; Mysoline [primidone]; and Oxycodone   Review of Systems Review of Systems  Constitutional: Positive for unexpected weight change. Negative for chills and fever.  Eyes: Negative for visual disturbance.  Respiratory: Negative for shortness of breath.   Cardiovascular: Negative for chest pain.  Gastrointestinal: Positive for abdominal pain. Negative for blood in stool, constipation, diarrhea, nausea and vomiting.  Genitourinary: Negative for dysuria, hematuria and urgency.       Urinary incontinence  Musculoskeletal: Positive for arthralgias (right shoulder) and back pain. Negative for neck pain.  Neurological: Positive for weakness. Negative for syncope, light-headedness, numbness and headaches.  All other systems reviewed and are negative.    Physical Exam Updated Vital Signs BP (!) 184/81 (BP Location: Left Arm)   Pulse 80   Temp 98.2 F (36.8 C)  (Oral)   Resp 20   SpO2 94%   Physical Exam  Constitutional: She is oriented to person, place, and time. She appears well-developed and well-nourished. No distress.  HENT:  Head: Normocephalic and atraumatic.  Eyes: Conjunctivae are normal. Right eye exhibits no discharge. Left eye exhibits no discharge.  Neck: Neck supple. No JVD present. No tracheal deviation present.  Decreased decreased range of motion on extension, this is chronic secondary to cervical spine fusion per the patient. No midline spine TTP, no paraspinal muscle tenderness, no deformity, crepitus, or step-off noted. Patient endorses "soreness "on range of motion of the neck   Cardiovascular: Normal rate, regular rhythm, normal heart sounds and intact distal pulses.   2+ radial and DP/PT pulses bl, negative Homan's bl, 2+ bilateral lower extremity edema, and nonpitting and chronic and unchanged per the patient  Pulmonary/Chest: Effort normal and breath sounds normal. She exhibits tenderness.  Left lateral chest wall TTP. Equal rise and fall of chest, no paradoxical wall motion.  Abdominal: Soft. She exhibits no distension. There is no tenderness.  Hypoactive bowel sounds, Murphy sign absent, Rovsing sign absent, no CVA tenderness  Genitourinary:  Genitourinary Comments: Examination performed in the presence of a chaperone. Good rectal tone.  Musculoskeletal: She exhibits edema.       Right shoulder: Normal. She exhibits normal range of motion, no tenderness, no bony tenderness, no swelling, no effusion, no crepitus, no deformity, no laceration, normal pulse and normal strength.       Left shoulder: Normal.  There is midline L5/S1 spinous process TTP, no paraspinal muscle tenderness, no SI joint tenderness, no deformity, crepitus, or step-off noted. No tenderness to palpation of the hips or lower extremities. No deformity or crepitus noted. Decreased range of motion of the bilateral hips, especially with internal and external  rotation. 4/5 hip flexor strength bl, but otherwise 5/5 strength of BUE and BLE major muscle groups with good grip strength. BLE edema as described in the CV section  Neurological: She is alert and oriented to person, place, and time. No cranial nerve deficit.  Mental Status:  Alert, thought content appropriate, able to give a coherent history. Speech fluent without evidence of aphasia. Able to follow 2 step commands without difficulty.  Cranial Nerves:  II:  Peripheral visual fields grossly normal, pupils equal, round, reactive to light III,IV, VI: ptosis not present, extra-ocular motions intact bilaterally  V,VII: smile symmetric, facial light touch sensation equal VIII: hearing grossly normal to voice  X: uvula elevates symmetrically  XI: bilateral shoulder shrug symmetric and strong XII: midline tongue extension without fassiculations Sensory: light touch normal in all extremities. Gait:   antalgic gait, unable to heel walk or toe walk  Skin: Skin is warm and dry. No erythema.  Psychiatric: She has a normal mood and affect. Her behavior is normal.  Nursing note and vitals reviewed.    ED Treatments / Results  Labs (all labs ordered are listed, but only abnormal results are displayed) Labs Reviewed  CBC WITH DIFFERENTIAL/PLATELET - Abnormal; Notable for the following:       Result Value   WBC 11.2 (*)    Monocytes Absolute 1.1 (*)    All other components within normal limits  COMPREHENSIVE METABOLIC PANEL - Abnormal; Notable for the following:    Glucose, Bld 45 (*)    BUN 22 (*)    ALT 12 (*)    All other components within normal limits  URINALYSIS, ROUTINE W REFLEX MICROSCOPIC - Abnormal; Notable for the following:    APPearance HAZY (*)    Glucose, UA >=500 (*)    Protein, ur 100 (*)    Bacteria, UA RARE (*)    Squamous Epithelial / LPF 6-30 (*)    All other components within normal limits  LIPASE, BLOOD  I-STAT TROPONIN, ED  CBG MONITORING, ED    EKG  EKG  Interpretation None       Radiology Dg Chest 2 View  Result Date: 05/14/2017 CLINICAL DATA:  Chest pain. Occasional left shoulder pain. Fall 8 days ago. EXAM: CHEST  2 VIEW COMPARISON:  01/04/2017 FINDINGS: The cardiomediastinal silhouette is within normal limits. Slight elevation of the right hemidiaphragm anteriorly is unchanged. No airspace consolidation, edema, pleural effusion, pneumothorax is identified. No acute osseous abnormality is identified. Prior cervical spine fusion is noted. IMPRESSION: No active cardiopulmonary disease. Electronically Signed   By: Logan Bores M.D.   On: 05/14/2017 18:28    Procedures Procedures (including critical care time)  Medications Ordered in ED Medications  dextrose 50 % solution 50 mL (50 mLs Intravenous Given 05/14/17 1832)     Initial Impression / Assessment and Plan / ED Course  I have reviewed the triage vital signs and the nursing notes.  Pertinent labs & imaging results that were available during my care of the patient were reviewed by me and considered in my medical decision making (see chart for details).     Patient with ongoing low back pain with acute right shoulder pain for 8 days.also presents with urinary incontinence and unexplained weight loss. Concern for cauda equina. She is afebrile and her vital signs are at her baseline. She did have lateral chest wall tenderness on palpation and with complaint of shoulder pain, chest x-ray was obtained which showed no active cardio pulmonary abnormality. Her EKG shows no ST segment abnormalities or arrhythmia and her troponin was negative. Low suspicion of ACS or MI contributing to her symptoms. She has no focal neurological deficits and is moving extremities spontaneously. She is ambulatory but it is very painful. UA is not concerning for UTI but is consistent with dehydration. Patient became diaphoretic while in the ED. Found to have a glucose of 45. Given 1 amp of D50 with significant  improvement on reevaluation. We will transfer to [facility] to obtain lumbar spine MRI and further evaluate. She may require admission into the hospital for management of pain if her MRI does not show evidence of emergent pathology. Spoke  with Dr. Johnney Killian at [facility] ED, who accepts patient. Patient seen and evaluated by Dr. Leonette Monarch who agrees with assessment and plan at this time. Final Clinical Impressions(s) / ED Diagnoses   Final diagnoses:  Acute bilateral low back pain with bilateral sciatica  Urinary incontinence, unspecified type  Acute pain of right shoulder    New Prescriptions New Prescriptions   No medications on file     Debroah Baller 05/15/17 [id number]    Fatima Blank, MD 05/17/17 [phone]

## 2017-05-14 NOTE — ED Notes (Signed)
Pt is refusing to have EKG done. Pt states "I do not need it. I do not want it." RN told pt it is her right as a patient to refuse but she would encourage her to have the EKG. Pt still declines.  PA made aware

## 2017-05-14 NOTE — ED Provider Notes (Signed)
Patient is transferred to [facility] emergency department for severe intractable back pain and MRI. MRI does show lumbar compression fracture but no etiology for the patient's urinary incontinence. She reports she is unstable and having falls at home. As well she is having severe pain which is preventing her from sleeping or performing activities of daily living. Patient is alert and appropriate. No respiratory distress. Abdomen soft with mild suprapubic discomfort. No guarding no rebound. Patient does have intact sensation bilateral lower extremities. She does have spontaneous use of extremities. Patient has intractable pain with failure home ADLs and frequent falls. As well she is having urinary incontinence. Plan will be for admission for pain control and further evaluation WITH OT PT for determination of patient is adequately safe and functional at home.   Charlesetta Shanks, MD 05/14/17 [phone]

## 2017-05-14 NOTE — ED Triage Notes (Signed)
Patient reports that she fell 8 days ago and fell on her buttocks. Patient states she was seen in the ED for the same last week and was given a prescription for Tramadol and is now out. Patient states she is now having pain in the lower back that radiates to the left shoulder, lower abdomen, right hip and top of both thighs.

## 2017-05-15 DIAGNOSIS — M5442 Lumbago with sciatica, left side: Secondary | ICD-10-CM

## 2017-05-15 DIAGNOSIS — M549 Dorsalgia, unspecified: Secondary | ICD-10-CM

## 2017-05-15 DIAGNOSIS — M5441 Lumbago with sciatica, right side: Secondary | ICD-10-CM

## 2017-05-15 LAB — GLUCOSE, CAPILLARY
Glucose-Capillary: 126 mg/dL — ABNORMAL HIGH (ref 65–99)
Glucose-Capillary: 150 mg/dL — ABNORMAL HIGH (ref 65–99)
Glucose-Capillary: 148 mg/dL — ABNORMAL HIGH (ref 65–99)
Glucose-Capillary: 185 mg/dL — ABNORMAL HIGH (ref 65–99)

## 2017-05-15 LAB — CBC
HCT: 37.3 % (ref 36.0–46.0)
Hemoglobin: 12 g/dL (ref 12.0–15.0)
MCH: 26.9 pg (ref 26.0–34.0)
MCHC: 32.2 g/dL (ref 30.0–36.0)
MCV: 83.6 fL (ref 78.0–100.0)
Platelets: 211 10*3/uL (ref 150–400)
RBC: 4.46 MIL/uL (ref 3.87–5.11)
RDW: 13.9 % (ref 11.5–15.5)
WBC: 9.6 10*3/uL (ref 4.0–10.5)

## 2017-05-15 MED ORDER — MIDODRINE HCL 5 MG PO TABS
5.0000 mg | ORAL_TABLET | Freq: Two times a day (BID) | ORAL | Status: DC
  Administered 2017-05-15 – 2017-05-16 (×3): 5 mg via ORAL
  Filled 2017-05-15 (×3): qty 1

## 2017-05-15 MED ORDER — LOSARTAN POTASSIUM 50 MG PO TABS
50.0000 mg | ORAL_TABLET | Freq: Every day | ORAL | Status: DC
  Administered 2017-05-15 – 2017-05-16 (×2): 50 mg via ORAL
  Filled 2017-05-15 (×2): qty 1

## 2017-05-15 MED ORDER — INSULIN LISPRO PROT & LISPRO (75-25 MIX) 100 UNIT/ML KWIKPEN: 15.0000 [IU] | PEN_INJECTOR | Freq: Two times a day (BID) | SUBCUTANEOUS | Status: DC

## 2017-05-15 MED ORDER — DULOXETINE HCL 20 MG PO CPEP
20.0000 mg | ORAL_CAPSULE | Freq: Every day | ORAL | Status: DC
  Administered 2017-05-15 – 2017-05-16 (×2): 20 mg via ORAL
  Filled 2017-05-15 (×2): qty 1

## 2017-05-15 MED ORDER — POLYETHYLENE GLYCOL 3350 17 G PO PACK
17.0000 g | PACK | Freq: Every day | ORAL | Status: DC
  Administered 2017-05-16: 17 g via ORAL
  Filled 2017-05-15 (×2): qty 1

## 2017-05-15 MED ORDER — OXYCODONE-ACETAMINOPHEN 5-325 MG PO TABS
1.0000 | ORAL_TABLET | ORAL | Status: DC | PRN
  Filled 2017-05-15: qty 1

## 2017-05-15 MED ORDER — FENTANYL CITRATE (PF) 100 MCG/2ML IJ SOLN: 25.0000 ug | INTRAMUSCULAR | Status: DC | PRN

## 2017-05-15 MED ORDER — ENOXAPARIN SODIUM 40 MG/0.4ML ~~LOC~~ SOLN
40.0000 mg | SUBCUTANEOUS | Status: DC
  Administered 2017-05-15 – 2017-05-16 (×2): 40 mg via SUBCUTANEOUS
  Filled 2017-05-15 (×2): qty 0.4

## 2017-05-15 MED ORDER — MORPHINE SULFATE (PF) 2 MG/ML IV SOLN
2.0000 mg | INTRAVENOUS | Status: DC | PRN
  Administered 2017-05-15: 2 mg via INTRAVENOUS
  Filled 2017-05-15: qty 1

## 2017-05-15 MED ORDER — HYDRALAZINE HCL 20 MG/ML IJ SOLN
5.0000 mg | Freq: Four times a day (QID) | INTRAMUSCULAR | Status: DC | PRN
  Administered 2017-05-15: 5 mg via INTRAVENOUS
  Filled 2017-05-15 (×2): qty 1

## 2017-05-15 MED ORDER — INSULIN ASPART PROT & ASPART (70-30 MIX) 100 UNIT/ML ~~LOC~~ SUSP
30.0000 [IU] | Freq: Every day | SUBCUTANEOUS | Status: DC
  Administered 2017-05-15 – 2017-05-16 (×2): 30 [IU] via SUBCUTANEOUS
  Filled 2017-05-15: qty 10

## 2017-05-15 MED ORDER — TRAMADOL HCL 50 MG PO TABS
50.0000 mg | ORAL_TABLET | Freq: Two times a day (BID) | ORAL | Status: DC
  Administered 2017-05-15: 50 mg via ORAL
  Filled 2017-05-15: qty 1

## 2017-05-15 MED ORDER — INSULIN ASPART 100 UNIT/ML ~~LOC~~ SOLN
0.0000 [IU] | Freq: Three times a day (TID) | SUBCUTANEOUS | Status: DC
  Administered 2017-05-15 (×3): 1 [IU] via SUBCUTANEOUS
  Administered 2017-05-16: 2 [IU] via SUBCUTANEOUS

## 2017-05-15 MED ORDER — MORPHINE SULFATE (PF) 4 MG/ML IV SOLN
4.0000 mg | INTRAVENOUS | Status: DC | PRN
  Administered 2017-05-15: 4 mg via INTRAVENOUS
  Filled 2017-05-15: qty 1

## 2017-05-15 MED ORDER — INSULIN ASPART PROT & ASPART (70-30 MIX) 100 UNIT/ML ~~LOC~~ SUSP
7.0000 [IU] | Freq: Every day | SUBCUTANEOUS | Status: DC
  Administered 2017-05-15: 7 [IU] via SUBCUTANEOUS
  Filled 2017-05-15: qty 10

## 2017-05-15 MED ORDER — LORAZEPAM 2 MG/ML IJ SOLN
2.0000 mg | Freq: Once | INTRAMUSCULAR | Status: AC
  Administered 2017-05-15: 2 mg via INTRAVENOUS
  Filled 2017-05-15: qty 1

## 2017-05-15 MED ORDER — PROPRANOLOL HCL 10 MG PO TABS
10.0000 mg | ORAL_TABLET | Freq: Every day | ORAL | Status: DC
  Administered 2017-05-15 – 2017-05-16 (×2): 10 mg via ORAL
  Filled 2017-05-15 (×2): qty 1

## 2017-05-15 MED ORDER — TRAMADOL HCL 50 MG PO TABS
50.0000 mg | ORAL_TABLET | Freq: Three times a day (TID) | ORAL | Status: DC | PRN
  Administered 2017-05-15 – 2017-05-16 (×2): 50 mg via ORAL
  Filled 2017-05-15 (×2): qty 1

## 2017-05-15 NOTE — H&P (Signed)
History and Physical  [name]:[id number] DOB: 1933/06/20 DOA: 05/14/2017  PCP:  Aretta Nip, MD   Chief Complaint:  Back pain  History of Present Illness:  Pt is a 83 yo female with hx of DJD who came with cc of back pain that is acute on chronic. The acute pain started 8 days ago after she slipped and fell on her back. She has been on tramadol prescribed by her PCP but that did not help much and she has had worsening baseline urine incontinence with no stool incontinence. She said the pain is lower back and radiating anteriorely to both thighs without sensory of motor deficits but the pain prevented her from ambulating well for the last week. Otherwise she has no complaints. She was transferred here from [facility] to get MRI.   Review of Systems:  CONSTITUTIONAL:     No night sweats.  No fatigue.  No fever. No chills. Eyes:                            No visual changes.  No eye pain.  No eye discharge.   ENT:                              No epistaxis.  No sinus pain.  No sore throat.   No congestion. RESPIRATORY:           No cough.  No wheeze.  No hemoptysis.  No dyspnea CARDIOVASCULAR   :  No chest pains.  No palpitations. GASTROINTESTINAL:  No abdominal pain.  No nausea. No vomiting.  No diarrhea. No   constipation.  No hematemesis.  No hematochezia.  No melena. GENITOURINARY:      No urgency.  +frequency.  No dysuria.  No hematuria.  +obstructive symptoms.  No discharge.  No pain.   MUSCULOSKELETAL:  +musculoskeletal pain.  No joint swelling.  +arthritis. NEUROLOGICAL:        No confusion.  No weakness. No headache. No seizure. PSYCHIATRIC:             No depression. No anxiety. No suicidal ideation. SKIN:                             No rashes.  No lesions.  No wounds. ENDOCRINE:                No weight loss.  No polydipsia.  No polyuria.  No polyphagia. HEMATOLOGIC:           No purpura.  No petechiae.  No bleeding.  ALLERGIC                 : No pruritus.  No  angioedema Other:  Past Medical and Surgical History:   Past Medical History:  Diagnosis Date  . Anemia    as a teenager  . Arthritis   . Cancer ([place])    skin  . Cholelithiasis   . Chronic insomnia   . Chronic low back pain   . Complication of anesthesia   . DDD (degenerative disc disease), lumbar   . Diabetes mellitus    Iddm x 12 years  . Diabetic peripheral neuropathy ([place])   . Essential tremor 04/01/2015  . GERD (gastroesophageal reflux disease)   . Hx of acute renal failure 02/2014   admitted to [facility]  .  Hx: UTI (urinary tract infection) 02/2014  . Hyperlipemia   . Hypertension   . Obesity   . Peripheral edema   . Pneumonia    as a child  . PONV (postoperative nausea and vomiting)    pt states Zofran not that helpful in the past, Phenergan works best for her  . Staph infection    1970's  . Umbilical hernia   . Wrist fracture    left October 2014   Past Surgical History:  Procedure Laterality Date  . ABDOMINAL HYSTERECTOMY    . BACK SURGERY  2005  . CARDIAC CATHETERIZATION  2000   Dr Einar Gip  . CATARACT EXTRACTION Bilateral    w/ lens implants  . COLONOSCOPY  >10 years  . EYE SURGERY    . HERNIA REPAIR    . LUMBAR LAMINECTOMY/DECOMPRESSION MICRODISCECTOMY Left 03/16/2014   Procedure: LUMBAR LAMINECTOMY/DECOMPRESSION MICRODISCECTOMY 1 LEVEL  lumbar level three/four;  Surgeon: [name], MD;  Location: [place];  Service: Neurosurgery;  Laterality: Left;  . LUMBAR LAMINECTOMY/DECOMPRESSION MICRODISCECTOMY Right 02/06/2015   Procedure: Right L4-5 Microdiskectomy;  Surgeon: Leeroy Cha, MD;  Location: [place] [facility];  Service: Neurosurgery;  Laterality: Right;  Right L4-5 Microdiskectomy  . NECK SURGERY    . OOPHORECTOMY    . TOTAL KNEE ARTHROPLASTY Right     Social History:   reports that she has never smoked. She has never used smokeless tobacco. She reports that she does not drink alcohol or use drugs.   Allergies  Allergen Reactions  .  Ciprofloxacin Other (See Comments)    Acute kidney failure  . Codeine Nausea And Vomiting  . Dilaudid [Hydromorphone Hcl] Nausea And Vomiting and Other (See Comments)    Severe sweating  . Mysoline [Primidone] Nausea Only  . Oxycodone Nausea And Vomiting    Family History  Problem Relation Age of Onset  . Cancer Brother        prostate  . Heart disease Brother   . Alzheimer's disease Brother   . Stroke Mother   . Hypertension Mother   . COPD Brother   . Cancer Brother        Prostate cancer  . Hypertension Sister        Congestive heart failure  . Heart attack Son       Prior to Admission medications   Medication Sig Start Date End Date Taking? Authorizing Provider  DULoxetine (CYMBALTA) 20 MG capsule Take 1 capsule (20 mg total) by mouth daily. 01/25/17  Yes Kathrynn Ducking, MD  HUMALOG MIX 75/25 KWIKPEN (75-25) 100 UNIT/ML Kwikpen Inject 15-60 Units into the skin 2 (two) times daily. Inject 60 units in the am and Inject 15 in the pm. 06/04/15  Yes [provider]  losartan (COZAAR) 50 MG tablet Take 50 mg by mouth daily. 04/28/17  Yes [provider]  midodrine (PROAMATINE) 5 MG tablet Take 5 mg by mouth 2 (two) times daily. 05/03/17  Yes [provider]  propranolol (INDERAL) 10 MG tablet Take 10 mg by mouth daily. 03/18/17  Yes [provider]  traMADol (ULTRAM) 50 MG tablet Take 1 tablet (50 mg total) by mouth every 6 (six) hours as needed for severe pain. 05/06/17  Yes Duffy Bruce, MD  gabapentin (NEURONTIN) 400 MG capsule Take 1 capsule (400 mg total) by mouth at bedtime. Patient not taking: Reported on 05/14/2017 01/06/17   Reyne Dumas, MD  midodrine (PROAMATINE) 10 MG tablet Take 1 tablet (10 mg total) by mouth 3 (  three) times daily with meals. Patient not taking: Reported on 05/06/2017 01/06/17   Reyne Dumas, MD  ondansetron (ZOFRAN ODT) 4 MG disintegrating tablet Take 1 tablet (4 mg total) by mouth every 8 (eight) hours as needed for  nausea or vomiting. Patient not taking: Reported on 05/14/2017 05/06/17   Duffy Bruce, MD  propranolol (INDERAL) 20 MG tablet Take 1 tablet (20 mg total) by mouth 2 (two) times daily. 01/06/17 02/05/17  Reyne Dumas, MD    Physical Exam: BP (!) 167/70   Pulse 80   Temp 98.2 F (36.8 C) (Oral)   Resp (!) 26   SpO2 94%   GENERAL :   Alert and cooperative, and appears to be in no acute distress. HEAD:           normocephalic. NECK:          supple, CARDIAC:    Normal S1 and S2. No gallop. No murmurs.  Vascular:     no peripheral edema.  LUNGS:       Clear to auscultation  ABDOMEN: Positive bowel sounds. Soft, mildly distended and tender to palpation, . No guarding or rebound.      MSK:           Straight leg test is pos  in right side  EXT           : No significant deformity or joint abnormality. Neuro        : Alert, oriented to person, place, and time.                      CN II-XII intact.  SKIN:            No rash. No lesions. PSYCH:       No hallucination. Patient is not suicidal.          Labs on Admission:  Reviewed.   Radiological Exams on Admission: Dg Chest 2 View  Result Date: 05/14/2017 CLINICAL DATA:  Chest pain. Occasional left shoulder pain. Fall 8 days ago. EXAM: CHEST  2 VIEW COMPARISON:  01/04/2017 FINDINGS: The cardiomediastinal silhouette is within normal limits. Slight elevation of the right hemidiaphragm anteriorly is unchanged. No airspace consolidation, edema, pleural effusion, pneumothorax is identified. No acute osseous abnormality is identified. Prior cervical spine fusion is noted. IMPRESSION: No active cardiopulmonary disease. Electronically Signed   By: Logan Bores M.D.   On: 05/14/2017 18:28   Mr Lumbar Spine Wo Contrast  Result Date: 05/14/2017 CLINICAL DATA:  Back pain.  Suspect cauda equina syndrome. EXAM: MRI LUMBAR SPINE WITHOUT CONTRAST TECHNIQUE: Multiplanar, multisequence MR imaging of the lumbar spine was performed. No intravenous contrast was  administered. COMPARISON:  Lumbar spine radiographs May 06, 2017 and MRI of the lumbar spine November 08, 2016 FINDINGS: SEGMENTATION: For the purposes of this report, the last well-formed intervertebral disc will be reported as L5-S1. ALIGNMENT: Maintained lumbar lordosis. No malalignment. VERTEBRAE:Acute mild L1 compression fracture with less than 25% superior endplate height loss, low T1 and bright STIR signal. No retropulsed bony fragments. Stable moderate to severe L4-5 disc height loss with disc desiccation all levels. Moderate chronic discogenic endplate changes N0-5. CONUS MEDULLARIS: Conus medullaris terminates at T12-L1 and demonstrates normal morphology and signal characteristics. Cauda equina is normal. PARASPINAL AND SOFT TISSUES: Moderate to severe paraspinal muscle atrophy, most consistent with denervation. DISC LEVELS: T12-L1, L1-2, L2-3: No disc bulge, canal stenosis nor neural foraminal narrowing. Mild to moderate facet arthropathy. L3-4: Status post laminectomies.  Moderate facet arthropathy without canal stenosis. Mild bilateral neural foraminal narrowing. L4-5: RIGHT facetectomy and laminectomy. Granulation tissue RIGHT lateral recess medially displacing the traversing L5 nerve. No canal stenosis. Moderate to severe RIGHT neural foraminal narrowing. L5-S1: RIGHT hemilaminectomy. No disc bulge. Moderate RIGHT, mild LEFT facet arthropathy without canal stenosis. Moderate RIGHT neural foraminal narrowing. IMPRESSION: 1. Acute mild L1 compression fracture, probable insufficiency fracture. 2. Stable postoperative change of the lower lumbar spine. 3. No canal stenosis. Granulation tissue L4-5 lateral recess may affect the traversing RIGHT L5 nerve, unchanged. 4. Neural foraminal narrowing L3-4 through L5-S1: Moderate to severe on the RIGHT at L4-5. Electronically Signed   By: Elon Alas M.D.   On: 05/14/2017 22:03      Assessment/Plan  Back pain:  Acute on chronic: due to compression  fx W/sciatica pain due to L5 compression  Started on pain meds : tramadol BID and dilaudid Q3H , transition to PO meds tmw miralax prn constipation  PT eval in am No cord compression Urology consult for urinary incontinence in am  HTN: cont home meds. Pain is likely contributing  Input & Output: NA Lines & Tubes: PIV DVT prophylaxis: [place] enoxaparin GI prophylaxis: NA Consultants: urology / PT Code Status: full Family Communication: at bedside  Disposition Plan:TBD     Gennaro Africa M.D [facility]

## 2017-05-15 NOTE — Progress Notes (Addendum)
[facility]   Patient seen after midnight see H&P for further details  Patient chart, labs and images reviewed. Patient seen and examined.  Patient is an 83 year old female with medical history of DJD cocaine complaining of back pain. Patient reports that she had a fall on her buttocks about a week ago came to the emergency department x-rays were done but unrevealing. She was seen with pain medication home Patient finished pain medication bottle with no improvement, decided to come back to the emergency department, an MRI was done showing a compression fracture of the L1. Patient was admitted for pain control and physical therapy evaluation.  Blood pressure (!) 186/63, pulse 78, temperature 97.9 F (36.6 C), temperature source Oral, resp. rate 17, height 5\' 8"  (1.727 m), weight 100.2 kg (221 lb), SpO2 97 %.  Image findings discussed with patient she is not interested in surgery. At this point will continue pain management and depending on PT eval and possible SNF for rehabilitation or home health PT.  Blood pressure slightly uncontrolled likely from pain will change to monitor for Percocet  Rest per H&P  Chipper Oman, MD

## 2017-05-15 NOTE — Care Management Obs Status (Signed)
[place] NOTIFICATION   Patient Details  Name: VADIE PRINCIPATO MRN: [id number] Date of Birth: 08-31-1933   Medicare Observation Status Notification Given:  Yes    Carles Collet, RN 05/15/2017, 2:46 PM

## 2017-05-15 NOTE — Evaluation (Signed)
Physical Therapy Evaluation Patient Details Name: [name] MRN: [id number] DOB: 07-10-1933 Today's Date: 05/15/2017   History of Present Illness  83 yo female with onset of falls with resulting back pain, found to have L1 compression fracture and R L4L5 severe foraminal narrowing.  PMHx:  DDD, DM, UTI, PN, HTN  Clinical Impression  Pt was seen for evaluation of her mobiltiy with 7/10 back pain and shaky buckled appearance to gait with RW.  Her plan is to see acutely and progress to SNF for more indepth rehab to decrease fall risk due to frequency at home.  Her follow up will be HHPT afterward and focus on safety and back protection as she is home alone with family a phone call away.    Follow Up Recommendations SNF    Equipment Recommendations  None recommended by PT    Recommendations for Other Services       Precautions / Restrictions Precautions Precautions: Back;Fall Precaution Comments: instructed her in body mechanics Restrictions Weight Bearing Restrictions: No      Mobility  Bed Mobility Overal bed mobility: Needs Assistance Bed Mobility: Supine to Sit     Supine to sit: Mod assist     General bed mobility comments: reminders of hand placement  Transfers Overall transfer level: Needs assistance Equipment used: Rolling walker (2 wheeled);1 person hand held assist Transfers: Sit to/from Stand Sit to Stand: Min assist         General transfer comment: good control with LE's and body mechanics to power up  Ambulation/Gait Ambulation/Gait assistance: Min guard Ambulation Distance (Feet): 125 Feet Assistive device: Rolling walker (2 wheeled);1 person hand held assist Gait Pattern/deviations: Step-through pattern;Decreased stride length;Wide base of support;Drifts right/left Gait velocity: reduced Gait velocity interpretation: Below normal speed for age/gender    Stairs            Wheelchair Mobility    Modified Rankin (Stroke Patients Only)        Balance Overall balance assessment: History of Falls;Needs assistance Sitting-balance support: Feet supported Sitting balance-Leahy Scale: Fair     Standing balance support: Bilateral upper extremity supported;During functional activity Standing balance-Leahy Scale: Fair Standing balance comment: less than fair dynamic balance                             Pertinent Vitals/Pain Pain Assessment: 0-10 Pain Score: 7  Pain Location: back Pain Descriptors / Indicators: Aching;Sore Pain Intervention(s): Monitored during session;Premedicated before session;Repositioned;Other (comment) (instructed protective body mechanics)    Home Living Family/patient expects to be discharged to:: Unsure Living Arrangements: Alone               Additional Comments: pt lives in senior apt.  Daughter and granddaughter live nearby.     Prior Function Level of Independence: Needs assistance   Gait / Transfers Assistance Needed: rollator usually but has RW and SPC  ADL's / Homemaking Assistance Needed: family for errands, self care assistance        Hand Dominance   Dominant Hand: Right    Extremity/Trunk Assessment   Upper Extremity Assessment Upper Extremity Assessment: Overall WFL for tasks assessed    Lower Extremity Assessment Lower Extremity Assessment: Overall WFL for tasks assessed    Cervical / Trunk Assessment Cervical / Trunk Assessment: Kyphotic  Communication   Communication: No difficulties  Cognition Arousal/Alertness: Awake/alert Behavior During Therapy: WFL for tasks assessed/performed Overall Cognitive Status: Within Functional Limits for tasks assessed  General Comments General comments (skin integrity, edema, etc.): Pt was noted to have edema ankles and limited tolerance for standing endurance, back pain with no variation     Exercises     Assessment/Plan    PT Assessment Patient  needs continued PT services  PT Problem List Decreased strength;Decreased range of motion;Decreased activity tolerance;Decreased balance;Decreased mobility;Decreased coordination;Obesity;Pain;Decreased safety awareness       PT Treatment Interventions DME instruction;Gait training;Functional mobility training;Therapeutic activities;Therapeutic exercise;Balance training;Neuromuscular re-education;Patient/family education    PT Goals (Current goals can be found in the Care Plan section)  Acute Rehab PT Goals Patient Stated Goal: to stop having falls PT Goal Formulation: With patient Time For Goal Achievement: 05/29/17 Potential to Achieve Goals: Good    Frequency Min 2X/week   Barriers to discharge Decreased caregiver support has no family with her    Co-evaluation               AM-PAC PT "6 Clicks" Daily Activity  Outcome Measure Difficulty turning over in bed (including adjusting bedclothes, sheets and blankets)?: Unable Difficulty moving from lying on back to sitting on the side of the bed? : Unable Difficulty sitting down on and standing up from a chair with arms (e.g., wheelchair, bedside commode, etc,.)?: Unable Help needed moving to and from a bed to chair (including a wheelchair)?: A Little Help needed walking in hospital room?: A Little Help needed climbing 3-5 steps with a railing? : A Lot 6 Click Score: 11    End of Session Equipment Utilized During Treatment: Gait belt Activity Tolerance: Patient tolerated treatment well;Patient limited by fatigue Patient left: in chair;with call bell/phone within reach;with nursing/sitter in room;Other (comment) (pt was propped for posture with pillows in the chair) Nurse Communication: Mobility status PT Visit Diagnosis: Unsteadiness on feet (R26.81);Other abnormalities of gait and mobility (R26.89);Repeated falls (R29.6);Muscle weakness (generalized) (M62.81);Difficulty in walking, not elsewhere classified (R26.2)    Time:  6295-2841 PT Time Calculation (min) (ACUTE ONLY): 32 min   Charges:   PT Evaluation $PT Eval Moderate Complexity: 1 Mod PT Treatments $Gait Training: 8-22 mins   PT G Codes:   PT G-Codes **NOT FOR INPATIENT CLASS** Functional Assessment Tool Used: AM-PAC 6 Clicks Basic Mobility;Clinical judgement Functional Limitation: Mobility: Walking and moving around Mobility: Walking and Moving Around Current Status (L2440): At least 40 percent but less than 60 percent impaired, limited or restricted Mobility: Walking and Moving Around Goal Status [phone]): At least 1 percent but less than 20 percent impaired, limited or restricted   Ramond Dial 05/15/2017, 1:52 PM   Mee Hives, PT MS Acute Rehab Dept. Number: [place] and [place]

## 2017-05-15 NOTE — Progress Notes (Signed)
Rt called for rapid response to pt room.  RT entered with pt on bed and working slightly yo breathe.  RT listened to BS and found mostly clear BS with slight diminished on left side.  Good air movement throughout.  RT and RN conclude it may be anxiety rather than a breathing issue.  RT left pt on RA and sats at 97%.  RT help RN pull pt up in bed and pt looked more comfortable after assessment.  Rt will continue to monitor as needed.

## 2017-05-15 NOTE — Progress Notes (Signed)
Patient still having 7/10 pain in back, she refused morphine, percocet and may other pain medications due to nausea and vomiting from past exposures. Provider increased tramadol.

## 2017-05-16 ENCOUNTER — Observation Stay (HOSPITAL_BASED_OUTPATIENT_CLINIC_OR_DEPARTMENT_OTHER)
Admission: EM | Admit: 2017-05-16 | Discharge: 2017-05-19 | Disposition: A | Discharge status: Home / Self Care | Payer: Medicare Other | Source: Home / Self Care | Attending: Internal Medicine | Admitting: Internal Medicine

## 2017-05-16 ENCOUNTER — Encounter (HOSPITAL_COMMUNITY): Payer: Self-pay

## 2017-05-16 DIAGNOSIS — E1169 Type 2 diabetes mellitus with other specified complication: Secondary | ICD-10-CM

## 2017-05-16 DIAGNOSIS — R32 Unspecified urinary incontinence: Secondary | ICD-10-CM

## 2017-05-16 DIAGNOSIS — I519 Heart disease, unspecified: Secondary | ICD-10-CM | POA: Diagnosis present

## 2017-05-16 DIAGNOSIS — S32010A Wedge compression fracture of first lumbar vertebra, initial encounter for closed fracture: Secondary | ICD-10-CM | POA: Diagnosis not present

## 2017-05-16 DIAGNOSIS — Z794 Long term (current) use of insulin: Secondary | ICD-10-CM | POA: Diagnosis not present

## 2017-05-16 DIAGNOSIS — K219 Gastro-esophageal reflux disease without esophagitis: Secondary | ICD-10-CM | POA: Diagnosis present

## 2017-05-16 DIAGNOSIS — I5189 Other ill-defined heart diseases: Secondary | ICD-10-CM | POA: Diagnosis present

## 2017-05-16 DIAGNOSIS — M549 Dorsalgia, unspecified: Secondary | ICD-10-CM | POA: Diagnosis present

## 2017-05-16 DIAGNOSIS — E785 Hyperlipidemia, unspecified: Secondary | ICD-10-CM | POA: Diagnosis present

## 2017-05-16 DIAGNOSIS — Z711 Person with feared health complaint in whom no diagnosis is made: Secondary | ICD-10-CM

## 2017-05-16 DIAGNOSIS — E119 Type 2 diabetes mellitus without complications: Secondary | ICD-10-CM

## 2017-05-16 DIAGNOSIS — I10 Essential (primary) hypertension: Secondary | ICD-10-CM | POA: Diagnosis present

## 2017-05-16 LAB — GLUCOSE, CAPILLARY
Glucose-Capillary: 183 mg/dL — ABNORMAL HIGH (ref 65–99)
Glucose-Capillary: 123 mg/dL — ABNORMAL HIGH (ref 65–99)

## 2017-05-16 MED ORDER — OXYCODONE-ACETAMINOPHEN 5-325 MG PO TABS: 1.0000 | ORAL_TABLET | Freq: Four times a day (QID) | ORAL | 0 refills | Status: DC | PRN

## 2017-05-16 MED ORDER — POLYETHYLENE GLYCOL 3350 17 G PO PACK: 17.0000 g | PACK | Freq: Every day | ORAL | 0 refills | Status: DC

## 2017-05-16 MED ORDER — IBUPROFEN 400 MG PO TABS: 400.0000 mg | ORAL_TABLET | Freq: Three times a day (TID) | ORAL | Status: DC

## 2017-05-16 MED ORDER — IBUPROFEN 400 MG PO TABS: 400.0000 mg | ORAL_TABLET | Freq: Three times a day (TID) | ORAL | 0 refills | Status: DC | PRN

## 2017-05-16 MED ORDER — MORPHINE SULFATE (PF) 2 MG/ML IV SOLN: 1.0000 mg | INTRAVENOUS | Status: DC | PRN

## 2017-05-16 NOTE — Clinical Social Work Note (Signed)
Clinical Social Worker facilitated patient discharge including contacting patient family(dtr @ bedside) and facility([facility]) to confirm patient discharge plans.  Clinical information faxed to facility and family agreeable with plan. CSW arranged ambulance transport via [facility] to [facility] RN to call report prior to discharge.  Clinical Social Worker will sign off for now as social work intervention is no longer needed. Please consult [place] again if new need arises.  Antonieta Slaven B. Joline Maxcy Clinical Social Work Dept Weekend Social Worker [phone] 4:18 PM

## 2017-05-16 NOTE — Progress Notes (Signed)
Report given to [facility]. 

## 2017-05-16 NOTE — Care Management (Signed)
ED CM received call from C. Lawyer EDP concerning patient who was discharged to [facility] today returning to ED. ED CM contacted [place] at [facility] She reports family felt that patient was unstable and  discharged too early from hospital.Family requested patient be walked to bathroom and appeared upset with care at the SNF and asked that patient be returned to [place] will follow up in the am to return to SNF.if no acute findings.

## 2017-05-16 NOTE — ED Triage Notes (Signed)
To hallway bed via EMS from [facility].  Pt d/c today to [facility] for rehab for PT for acute L1 compression fracture.  Upon pts arrival to [facility] she is unable to get OOB or go to [facility] until PT assesses her in the am.

## 2017-05-16 NOTE — ED Notes (Signed)
Dr. Laverta Baltimore at the bedside speaking with family at this time.

## 2017-05-16 NOTE — Progress Notes (Signed)
While rounding RN asked me to assess patient. Per patient, she state " my throat feels like its closing and I am having trouble breathing".  Patient is visibly very anxious, oxygen sats > 95% on RA and other VSS.  Patient was also complaining for back pain.  Lung sounds diminished in the bases, L >R, but good air movement and currently airway is being protected.  Patient appears to be having a panic attack, I paged [facility] NP, will administer 2mg  IV morphine and 2mg  IV Ativan.  Prior to, during, and after, patient maintained oxygen saturations > 95% on RA.   I called the RN for and update ar 0200, patient was resting comfortably, will follow as needed.    Start Time 2115 End Time 2145

## 2017-05-16 NOTE — ED Notes (Signed)
[place], per [facility] pt and daughter felt pt was too unstable to be there tonight.  Pt wanted to be ambulatory tonight and staff recommended bed rest and bed pan and pt would be evaluated PT in the am.

## 2017-05-16 NOTE — Clinical Social Work Note (Signed)
Clinical Social Work Assessment  Patient Details  Name: [name] MRN: [id number] Date of Birth: 27-Feb-1933  Date of referral:  05/16/17               Reason for consult:  Facility Placement, Family Concerns, Discharge Planning                Permission sought to share information with:  Family Supports, Case Freight forwarder, Chartered certified accountant granted to share information::  Yes, Verbal Permission Granted  Name::        Agency::     Relationship::     Contact Information:     Housing/Transportation Living arrangements for the past 2 months:  Single Family Home Source of Information:  Adult Children Patient Interpreter Needed:  None Criminal Activity/Legal Involvement Pertinent to Current Situation/Hospitalization:  No - Comment as needed Significant Relationships:  Adult Children Lives with:  Self Do you feel safe going back to the place where you live?  Yes Need for family participation in patient care:  Yes (Comment)  Care giving concerns:  Pt lives alone in a [facility], no options for 24 hr care at this time, pt/family agreeable to short term SNF stay.   Social Worker assessment / plan:  Prior to hospitalization pt was living alone in a [facility], pt was independent with all ADL's and ambulated with a walker. Pt/family hopeful pt will get back to baseline with short term SNF stay and return home independent. CSW will coordinate pt's transfer to SNF.  Employment status:  Retired Forensic scientist:  Medicare PT Recommendations:  [place] / Referral to community resources:  [place]  Patient/Family's Response to care:  Pt/family appreciative of care pt is receiving.  Patient/Family's Understanding of and Emotional Response to Diagnosis, Current Treatment, and Prognosis:  Pt/family has good understanding of dx/trtmnt/prognosis and are hopeful pt will get back to baseline with short SNF stay and return home  independent.  Emotional Assessment Appearance:  Appears stated age Attitude/Demeanor/Rapport:  Unable to Assess Affect (typically observed):  Accepting Orientation:  Oriented to Self, Oriented to Situation Alcohol / Substance use:  Not Applicable Psych involvement (Current and /or in the community):  No (Comment)  Discharge Needs  Concerns to be addressed:  No discharge needs identified Readmission within the last 30 days:  No Current discharge risk:  Lives alone Barriers to Discharge:  Continued Medical Work up   [name], [name], [name] 05/16/2017, 4:25 PM

## 2017-05-16 NOTE — NC FL2 (Signed)
[place] LEVEL OF CARE SCREENING TOOL     IDENTIFICATION  Patient Name: [name] Birthdate: 1932-12-11 Sex: female Admission Date (Current Location): 05/14/2017  [employer] and [place] Number:  Herbalist and Address:  [facility], [place] [address], [place], [place] [address]      Provider Number: [id number]  Attending Physician Name and Address:  Tawni Millers  Relative Name and Phone Number:  Dtr [place]: Hospital Recommended Level of Care: [place] Prior Approval Number:    Date Approved/Denied:   PASRR Number: [id number]  Discharge Plan: SNF    Current Diagnoses: Patient Active Problem List   Diagnosis Date Noted  . Back pain 05/15/2017  . Syncope 01/05/2017  . Tachycardia 01/04/2017  . CKD (chronic kidney disease) stage 3, GFR 30-59 ml/min 01/04/2017  . Diabetes mellitus with complication ([place]) 31/51/7616  . Peripheral neuropathy 01/04/2017  . Orthostatic hypotension 01/04/2017  . Fall 12/24/2016  . Acute encephalopathy 12/24/2016  . Depression 12/24/2016  . Acute renal failure superimposed on stage 3 chronic kidney disease ([place]) 12/24/2016  . Vertigo 05/16/2016  . Chest pain 05/15/2016  . Essential tremor 04/01/2015  . Chronic low back pain 04/01/2015  . Lumbar herniated disc 02/06/2015  . Lumbar radiculopathy 03/20/2014  . UTI (lower urinary tract infection) 03/18/2014  . Hyperglycemia 03/18/2014  . Dyslipidemia 03/18/2014  . Lumbar stenosis 03/16/2014  . HTN (hypertension) 02/12/2014  . Diabetes mellitus ([place]) 02/12/2014  . Acute renal failure ([place]) 02/11/2014  . H/O total knee replacement 08/11/2013  . Gallstones 10/01/2011    Orientation RESPIRATION BLADDER Height & Weight     Self, Time, Situation  Normal Continent Weight: 221 lb (100.2 kg) Height:  5\' 8"  (172.7 cm)  BEHAVIORAL SYMPTOMS/MOOD NEUROLOGICAL BOWEL NUTRITION STATUS   Continent    AMBULATORY STATUS COMMUNICATION OF NEEDS Skin   Limited Assist Verbally Normal                       Personal Care Assistance Level of Assistance              Functional Limitations Info  Speech, Hearing, Sight Sight Info: Adequate Hearing Info: Adequate Speech Info: Adequate    SPECIAL CARE FACTORS FREQUENCY  OT (By licensed OT), PT (By licensed PT)     PT Frequency: 5x wk OT Frequency: 5x wk            Contractures Contractures Info: Not present    Additional Factors Info  Code Status, Allergies Code Status Info: Full Code Allergies Info: Ciprofloxacin, Codeine, Dilaudid Hydromorphone Hcl, Mysoline Primidone, Oxycodone           Current Medications (05/16/2017):  This is the current hospital active medication list Current Facility-Administered Medications  Medication Dose Route Frequency Provider Last Rate Last Dose  . DULoxetine (CYMBALTA) DR capsule 20 mg  20 mg Oral Daily Gennaro Africa, MD   20 mg at 05/16/17 0737  . enoxaparin (LOVENOX) injection 40 mg  40 mg Subcutaneous Q24H Gennaro Africa, MD   40 mg at 05/16/17 1062  . hydrALAZINE (APRESOLINE) injection 5 mg  5 mg Intravenous Q6H PRN Patrecia Pour, Christean Grief, MD   5 mg at 05/15/17 2124  . ibuprofen (ADVIL,MOTRIN) tablet 400 mg  400 mg Oral TID Chey Rachels, Jimmy Picket, MD      . insulin aspart (novoLOG) injection 0-9 Units  0-9 Units Subcutaneous TID  WC Gennaro Africa, MD   2 Units at 05/16/17 [phone]  . insulin aspart protamine- aspart (NOVOLOG MIX 70/30) injection 30 Units  30 Units Subcutaneous Q breakfast Gennaro Africa, MD   30 Units at 05/16/17 3419   And  . insulin aspart protamine- aspart (NOVOLOG MIX 70/30) injection 7 Units  7 Units Subcutaneous Q supper Gennaro Africa, MD   7 Units at 05/15/17 1807  . losartan (COZAAR) tablet 50 mg  50 mg Oral Daily Gennaro Africa, MD   50 mg at 05/16/17 0824  . midodrine (PROAMATINE) tablet 5 mg  5 mg Oral BID Gennaro Africa, MD   5 mg at 05/16/17 3790  . morphine  2 MG/ML injection 1 mg  1 mg Intravenous Q4H PRN Ellisyn Icenhower, Jimmy Picket, MD      . oxyCODONE-acetaminophen (PERCOCET/ROXICET) 5-325 MG per tablet 1 tablet  1 tablet Oral Q4H PRN Patrecia Pour, Christean Grief, MD      . polyethylene glycol (MIRALAX / GLYCOLAX) packet 17 g  17 g Oral Daily Gennaro Africa, MD   17 g at 05/16/17 2409  . propranolol (INDERAL) tablet 10 mg  10 mg Oral Daily Gennaro Africa, MD   10 mg at 05/16/17 7353     Discharge Medications: Please see discharge summary for a list of discharge medications.  Relevant Imaging Results:  Relevant Lab Results:   Additional Information [id number]  [place] Flemings B, LCSWA

## 2017-05-16 NOTE — Discharge Summary (Signed)
Physician Discharge Summary  [name] ALP:[id number] DOB: [birth year] DOA: 05/14/2017  PCP: Aretta Nip, MD  Admit date: 05/14/2017 Discharge date: 05/16/2017  Admitted From: Home  Disposition:  SNF  Recommendations for Outpatient Follow-up:  1. Follow up with PCP in 1- week 2. Patient discharge to snf to continue physical therapy  Home Health: Na  Equipment/Devices: Walker   Discharge Condition: Stable CODE STATUS: Full  Diet recommendation: Regular.   Brief/Interim Summary:  83 year old female presented with back pain. Patient is known to have degenerative joint disease with chronic back pain. Patient complain of ongoing pain for the last 8 days, exacerbated after a mechanical fall. Patient was placed on tramadol as an outpatient with no relief of her symptoms, she noticed urinary and stool incontinence but no weakness or paresthesias at her lower extremities. On initial physical examination blood pressure 167/70, heart rate 80, temperature 98.2, respiratory 26, oxygen saturation 94%. Moist mucous membranes, lungs clear to auscultation bilaterally, heart S1-S2 present and rhythmic, abdomen was soft mild distended and tender to palpation, no guarding or rebound, no lower extremity edema. Lumbar MRI showed acute mild L1 compression fracture. Neral foraminal narrowing L3-L4 through L5-S1, moderate to severe on the right at L4-L5. Sodium 140, potassium 3.8, chloride 102, bicarbonate 29, glucose 45, BUN 22, creatinine 0.85, white count 11.2, hemoglobin 14.3, hematocrit 42.1, platelets 248, urinalysis greater than 500 glucose, specific gravity 1.022, protein 100, pH 6, white cells 0-5. Chest x-ray negative for infiltrates. EKG with normal sinus rhythm.  Patient was admitted to the hospital with the working diagnosis of acute or chronic back pain, due to acute L1 compression fracture   1. Acute L1 compression fracture. Patient was seen by physical therapy, recommendations to transfer  patient to skilled nursing facility, continue pain control with ibuprofen and oxycodone. Out of bed as tolerated.  2. Hypertension. Blood pressure remained well-controlled, continue losartan and propanolol. Note that patient is on midodrine.  3. Panic attack. Continue duloxetine, per home regimen. Patient was noted to have episodes of anxiety during her hospitalization.   4. Type 2 diabetes mellitus. Patient was placed on insulin status, for glucose coverage and monitoring, patient will resume insulin 7030, 30 units a.m. and 7 units p.m.   Discharge Diagnoses:  Active Problems:   Back pain    Discharge Instructions   Allergies as of 05/16/2017      Reactions   Ciprofloxacin Other (See Comments)   Acute kidney failure   Codeine Nausea And Vomiting   Dilaudid [hydromorphone Hcl] Nausea And Vomiting, Other (See Comments)   Severe sweating   Mysoline [primidone] Nausea Only   Oxycodone Nausea And Vomiting      Medication List    STOP taking these medications   gabapentin 400 MG capsule Commonly known as:  NEURONTIN   ondansetron 4 MG disintegrating tablet Commonly known as:  ZOFRAN ODT   traMADol 50 MG tablet Commonly known as:  ULTRAM     TAKE these medications   DULoxetine 20 MG capsule Commonly known as:  CYMBALTA Take 1 capsule (20 mg total) by mouth daily.   HUMALOG MIX 75/25 KWIKPEN (75-25) 100 UNIT/ML Kwikpen Generic drug:  Insulin Lispro Prot & Lispro Inject 15-60 Units into the skin 2 (two) times daily. Inject 60 units in the am and Inject 15 in the pm.   ibuprofen 400 MG tablet Commonly known as:  ADVIL,MOTRIN Take 1 tablet (400 mg total) by mouth every 8 (eight) hours as needed for moderate pain (back  pain.).   losartan 50 MG tablet Commonly known as:  COZAAR Take 50 mg by mouth daily.   midodrine 5 MG tablet Commonly known as:  PROAMATINE Take 5 mg by mouth 2 (two) times daily. What changed:  Another medication with the same name was removed.  Continue taking this medication, and follow the directions you see here.   oxyCODONE-acetaminophen 5-325 MG tablet Commonly known as:  PERCOCET/ROXICET Take 1 tablet by mouth every 6 (six) hours as needed for moderate pain or severe pain.   polyethylene glycol packet Commonly known as:  MIRALAX / GLYCOLAX Take 17 g by mouth daily.   propranolol 10 MG tablet Commonly known as:  INDERAL Take 10 mg by mouth daily. What changed:  Another medication with the same name was removed. Continue taking this medication, and follow the directions you see here.            Discharge Care Instructions        Start     Ordered   05/17/17 0000  polyethylene glycol (MIRALAX / GLYCOLAX) packet  Daily     05/16/17 1540   05/16/17 0000  ibuprofen (ADVIL,MOTRIN) 400 MG tablet  Every 8 hours PRN     05/16/17 1540   05/16/17 0000  oxyCODONE-acetaminophen (PERCOCET/ROXICET) 5-325 MG tablet  Every 6 hours PRN     05/16/17 1540   05/16/17 0000  Increase activity slowly     05/16/17 1540   05/16/17 0000  Diet - low sodium heart healthy     05/16/17 1540   05/16/17 0000  Discharge instructions    Comments:  Please follow with primary care in 7 days.   05/16/17 1540      Allergies  Allergen Reactions  . Ciprofloxacin Other (See Comments)    Acute kidney failure  . Codeine Nausea And Vomiting  . Dilaudid [Hydromorphone Hcl] Nausea And Vomiting and Other (See Comments)    Severe sweating  . Mysoline [Primidone] Nausea Only  . Oxycodone Nausea And Vomiting    Consultations:     Procedures/Studies: Dg Chest 2 View  Result Date: 05/14/2017 CLINICAL DATA:  Chest pain. Occasional left shoulder pain. Fall 8 days ago. EXAM: CHEST  2 VIEW COMPARISON:  01/04/2017 FINDINGS: The cardiomediastinal silhouette is within normal limits. Slight elevation of the right hemidiaphragm anteriorly is unchanged. No airspace consolidation, edema, pleural effusion, pneumothorax is identified. No acute osseous  abnormality is identified. Prior cervical spine fusion is noted. IMPRESSION: No active cardiopulmonary disease. Electronically Signed   By: Logan Bores M.D.   On: 05/14/2017 18:28   Dg Lumbar Spine Complete  Result Date: 05/06/2017 CLINICAL DATA:  Fall. EXAM: LUMBAR SPINE - COMPLETE 4+ VIEW COMPARISON:  MRI 11/08/2016 .  Lumbar spine series 220 2018. FINDINGS: Lumbar spine scoliosis concave left. No acute bony abnormality identified. Postsurgical changes lower lumbar spine. Minimal thoracolumbar spine compressions are again noted. These are unchanged. No evidence of acute fracture. Symptoms persist MRI can be obtained. Rounded calcification noted in the right upper quadrant is unchanged and most likely a gallstone. IMPRESSION: 1. Rounded calcification in the right upper quadrant unchanged most likely a gallstone. 2. Diffuse multilevel degenerative change. Postsurgical changes lower lumbar spine . Stable minimal thoracolumbar spine compressions fractures. No change. No acute bony abnormality identified. Exam is stable from prior exam. Electronically Signed   By: Marcello Moores  Register   On: 05/06/2017 17:05   Mr Lumbar Spine Wo Contrast  Result Date: 05/14/2017 CLINICAL DATA:  Back pain.  Suspect  cauda equina syndrome. EXAM: MRI LUMBAR SPINE WITHOUT CONTRAST TECHNIQUE: Multiplanar, multisequence MR imaging of the lumbar spine was performed. No intravenous contrast was administered. COMPARISON:  Lumbar spine radiographs May 06, 2017 and MRI of the lumbar spine November 08, 2016 FINDINGS: SEGMENTATION: For the purposes of this report, the last well-formed intervertebral disc will be reported as L5-S1. ALIGNMENT: Maintained lumbar lordosis. No malalignment. VERTEBRAE:Acute mild L1 compression fracture with less than 25% superior endplate height loss, low T1 and bright STIR signal. No retropulsed bony fragments. Stable moderate to severe L4-5 disc height loss with disc desiccation all levels. Moderate chronic discogenic  endplate changes S0-1. CONUS MEDULLARIS: Conus medullaris terminates at T12-L1 and demonstrates normal morphology and signal characteristics. Cauda equina is normal. PARASPINAL AND SOFT TISSUES: Moderate to severe paraspinal muscle atrophy, most consistent with denervation. DISC LEVELS: T12-L1, L1-2, L2-3: No disc bulge, canal stenosis nor neural foraminal narrowing. Mild to moderate facet arthropathy. L3-4: Status post laminectomies. Moderate facet arthropathy without canal stenosis. Mild bilateral neural foraminal narrowing. L4-5: RIGHT facetectomy and laminectomy. Granulation tissue RIGHT lateral recess medially displacing the traversing L5 nerve. No canal stenosis. Moderate to severe RIGHT neural foraminal narrowing. L5-S1: RIGHT hemilaminectomy. No disc bulge. Moderate RIGHT, mild LEFT facet arthropathy without canal stenosis. Moderate RIGHT neural foraminal narrowing. IMPRESSION: 1. Acute mild L1 compression fracture, probable insufficiency fracture. 2. Stable postoperative change of the lower lumbar spine. 3. No canal stenosis. Granulation tissue L4-5 lateral recess may affect the traversing RIGHT L5 nerve, unchanged. 4. Neural foraminal narrowing L3-4 through L5-S1: Moderate to severe on the RIGHT at L4-5. Electronically Signed   By: Elon Alas M.D.   On: 05/14/2017 22:03   Dg Hips Bilat W Or Wo Pelvis 5 Views  Result Date: 05/06/2017 CLINICAL DATA:  83 year old female status post fall with bilateral hip pain. EXAM: DG HIP (WITH OR WITHOUT PELVIS) 5+V BILAT COMPARISON:  None. FINDINGS: There is no evidence of hip fracture or dislocation. There is no evidence of arthropathy or other focal bone abnormality. Soft tissues and visceral contours unremarkable. IMPRESSION: No evidence for acute hip fracture or dislocation. Electronically Signed   By: Kristopher Oppenheim M.D.   On: 05/06/2017 15:23       Subjective: Back pain, radiated to the lower abdomen, can get up to 8/10 in intensity, dull to sharp  in nature, mild improvement with analgesics, worse with movement. Has been able to work with physical therapy.    Discharge Exam: Vitals:   05/16/17 0852 05/16/17 1244  BP: (!) 171/79 (!) 133/50  Pulse: 90 81  Resp:  20  Temp:  97.9 F (36.6 C)  SpO2:  96%   Vitals:   05/16/17 0300 05/16/17 0603 05/16/17 0852 05/16/17 1244  BP: (!) 169/72 (!) 179/78 (!) 171/79 (!) 133/50  Pulse: 92 94 90 81  Resp: (!) 24 16  20   Temp: 97.7 F (36.5 C) (!) 97.5 F (36.4 C)  97.9 F (36.6 C)  TempSrc: Oral   Oral  SpO2: 94% 97%  96%  Weight:      Height:        General: deconditioned Neurology: Awake and alert, non focal  E ENT: mild pallor, no icterus, oral mucosa moist Cardiovascular: S1-S2 present, rhythmic, no gallops, rubs, or murmurs. No jugular venous distention, positive non pitting lower extremity edema. Pulmonary: vesicular breath sounds bilaterally, adequate air movement, no wheezing, rhonchi or rales. Gastrointestinal. Abdomen flat, no organomegaly, non tender, no rebound or guarding Skin. No rashes Musculoskeletal: no joint  deformities    The results of significant diagnostics from this hospitalization (including imaging, microbiology, ancillary and laboratory) are listed below for reference.     Microbiology: No results found for this or any previous visit (from the past 240 hour(s)).   Labs: BNP (last 3 results) No results for input(s): BNP in the last 8760 hours. Basic Metabolic Panel:  Recent Labs Lab 05/14/17 1646  NA 140  K 3.8  CL 102  CO2 29  GLUCOSE 45*  BUN 22*  CREATININE 0.85  CALCIUM 9.8   Liver Function Tests:  Recent Labs Lab 05/14/17 1646  AST 18  ALT 12*  ALKPHOS 89  BILITOT 0.7  PROT 7.9  ALBUMIN 4.1    Recent Labs Lab 05/14/17 1646  LIPASE 22   No results for input(s): AMMONIA in the last 168 hours. CBC:  Recent Labs Lab 05/14/17 1646 05/15/17 0529  WBC 11.2* 9.6  NEUTROABS 7.2  --   HGB 14.3 12.0  HCT 42.1 37.3   MCV 82.4 83.6  PLT 248 211   Cardiac Enzymes: No results for input(s): CKTOTAL, CKMB, CKMBINDEX, TROPONINI in the last 168 hours. BNP: Invalid input(s): POCBNP CBG:  Recent Labs Lab 05/15/17 1131 05/15/17 1700 05/15/17 2019 05/16/17 0739 05/16/17 1242  GLUCAP 150* 148* 185* 183* 123*   D-Dimer No results for input(s): DDIMER in the last 72 hours. Hgb A1c No results for input(s): HGBA1C in the last 72 hours. Lipid Profile No results for input(s): CHOL, HDL, LDLCALC, TRIG, CHOLHDL, LDLDIRECT in the last 72 hours. Thyroid function studies No results for input(s): TSH, T4TOTAL, T3FREE, THYROIDAB in the last 72 hours.  Invalid input(s): FREET3 Anemia work up No results for input(s): VITAMINB12, FOLATE, FERRITIN, TIBC, IRON, RETICCTPCT in the last 72 hours. Urinalysis    Component Value Date/Time   COLORURINE YELLOW 05/14/2017 1646   APPEARANCEUR HAZY (A) 05/14/2017 1646   LABSPEC 1.022 05/14/2017 1646   PHURINE 6.0 05/14/2017 1646   GLUCOSEU >=500 (A) 05/14/2017 1646   HGBUR NEGATIVE 05/14/2017 1646   BILIRUBINUR NEGATIVE 05/14/2017 1646   KETONESUR NEGATIVE 05/14/2017 1646   PROTEINUR 100 (A) 05/14/2017 1646   UROBILINOGEN 0.2 05/03/2014 0942   NITRITE NEGATIVE 05/14/2017 1646   LEUKOCYTESUR NEGATIVE 05/14/2017 1646   Sepsis Labs Invalid input(s): PROCALCITONIN,  WBC,  LACTICIDVEN Microbiology No results found for this or any previous visit (from the past 240 hour(s)).   Time coordinating discharge: 45 minutes  SIGNED:   Tawni Millers, MD  [facility] 05/16/2017, 3:25 PM Pager [phone]  If 7PM-7AM, please contact night-coverage [email or web address] Password [email or web address]

## 2017-05-16 NOTE — Clinical Social Work Note (Signed)
CSW notified that pt needs SNF placement, ready for DC today. SNF referrals sent, will FU with facilities for bed offers.  Following for dispo planning.  Gurbani Figge B. Joline Maxcy Clinical Social Work Dept Weekend Social Worker [phone] 3:35 PM

## 2017-05-16 NOTE — Clinical Social Work Placement (Signed)
   CLINICAL SOCIAL WORK PLACEMENT  NOTE  Date:  05/16/2017  Patient Details  Name: [name] MRN: [id number] Date of Birth: [birth year]  Clinical Social Work is seeking post-discharge placement for this patient at the [place] level of care (*CSW will initial, date and re-position this form in  chart as items are completed):  Yes   Patient/family provided with [place] Work Department's list of facilities offering this level of care within the geographic area requested by the patient (or if unable, by the patient's family).  Yes   Patient/family informed of their freedom to choose among providers that offer the needed level of care, that participate in Medicare, Medicaid or managed care program needed by the patient, have an available bed and are willing to accept the patient.  Yes   Patient/family informed of [facility] ownership interest in [facility] and [facility], as well as of the fact that they are under no obligation to receive care at these facilities.  PASRR submitted to EDS on 05/16/17     PASRR number received on 05/16/17     Existing PASRR number confirmed on       FL2 transmitted to all facilities in geographic area requested by pt/family on 05/16/17     FL2 transmitted to all facilities within larger geographic area on       Patient informed that his/her managed care company has contracts with or will negotiate with certain facilities, including the following:        Yes   Patient/family informed of bed offers received.  Patient chooses bed at  [facility])     Physician recommends and patient chooses bed at      Patient to be transferred to  [facility]) on 05/16/17.  Patient to be transferred to facility by  [facility])     Patient family notified on 05/16/17 of transfer.  Name of family member notified:  Dtr @ bedside     PHYSICIAN Please sign FL2     Additional Comment:     _______________________________________________ Serafina Mitchell, LCSWA 05/16/2017, 4:20 PM

## 2017-05-16 NOTE — ED Notes (Signed)
ED Provider at bedside. 

## 2017-05-17 ENCOUNTER — Encounter (HOSPITAL_COMMUNITY): Payer: Self-pay | Admitting: General Surgery

## 2017-05-17 DIAGNOSIS — K219 Gastro-esophageal reflux disease without esophagitis: Secondary | ICD-10-CM | POA: Diagnosis not present

## 2017-05-17 DIAGNOSIS — E785 Hyperlipidemia, unspecified: Secondary | ICD-10-CM | POA: Diagnosis present

## 2017-05-17 DIAGNOSIS — E119 Type 2 diabetes mellitus without complications: Secondary | ICD-10-CM | POA: Diagnosis not present

## 2017-05-17 DIAGNOSIS — I10 Essential (primary) hypertension: Secondary | ICD-10-CM | POA: Diagnosis not present

## 2017-05-17 DIAGNOSIS — S32010A Wedge compression fracture of first lumbar vertebra, initial encounter for closed fracture: Secondary | ICD-10-CM | POA: Diagnosis not present

## 2017-05-17 DIAGNOSIS — Z794 Long term (current) use of insulin: Secondary | ICD-10-CM | POA: Diagnosis not present

## 2017-05-17 DIAGNOSIS — M549 Dorsalgia, unspecified: Secondary | ICD-10-CM | POA: Diagnosis present

## 2017-05-17 DIAGNOSIS — I519 Heart disease, unspecified: Secondary | ICD-10-CM | POA: Diagnosis present

## 2017-05-17 DIAGNOSIS — I5189 Other ill-defined heart diseases: Secondary | ICD-10-CM | POA: Diagnosis present

## 2017-05-17 LAB — CBC WITH DIFFERENTIAL/PLATELET
Basophils Absolute: 0 10*3/uL (ref 0.0–0.1)
Basophils Relative: 0 %
Eosinophils Absolute: 0.1 10*3/uL (ref 0.0–0.7)
Eosinophils Relative: 2 %
HCT: 39.3 % (ref 36.0–46.0)
Hemoglobin: 12.9 g/dL (ref 12.0–15.0)
Lymphocytes Relative: 23 %
Lymphs Abs: 1.7 10*3/uL (ref 0.7–4.0)
MCH: 27.3 pg (ref 26.0–34.0)
MCHC: 32.8 g/dL (ref 30.0–36.0)
MCV: 83.3 fL (ref 78.0–100.0)
Monocytes Absolute: 0.7 10*3/uL (ref 0.1–1.0)
Monocytes Relative: 9 %
Neutro Abs: 4.9 10*3/uL (ref 1.7–7.7)
Neutrophils Relative %: 66 %
Platelets: 218 10*3/uL (ref 150–400)
RBC: 4.72 MIL/uL (ref 3.87–5.11)
RDW: 14.2 % (ref 11.5–15.5)
WBC: 7.4 10*3/uL (ref 4.0–10.5)

## 2017-05-17 LAB — CBG MONITORING, ED: Glucose-Capillary: 171 mg/dL — ABNORMAL HIGH (ref 65–99)

## 2017-05-17 LAB — BASIC METABOLIC PANEL
Anion gap: 8 (ref 5–15)
BUN: 18 mg/dL (ref 6–20)
CO2: 26 mmol/L (ref 22–32)
Calcium: 9 mg/dL (ref 8.9–10.3)
Chloride: 103 mmol/L (ref 101–111)
Creatinine, Ser: 0.9 mg/dL (ref 0.44–1.00)
GFR calc Af Amer: >60 mL/min (ref >60)
GFR calc non Af Amer: 58 mL/min — ABNORMAL LOW (ref >60)
Glucose, Bld: 197 mg/dL — ABNORMAL HIGH (ref 65–99)
Potassium: 5.4 mmol/L — ABNORMAL HIGH (ref 3.5–5.1)
Sodium: 137 mmol/L (ref 135–145)

## 2017-05-17 LAB — PROTIME-INR
INR: 1.06
Prothrombin Time: 13.8 seconds (ref 11.4–15.2)

## 2017-05-17 LAB — GLUCOSE, CAPILLARY
Glucose-Capillary: 173 mg/dL — ABNORMAL HIGH (ref 65–99)
Glucose-Capillary: 197 mg/dL — ABNORMAL HIGH (ref 65–99)

## 2017-05-17 LAB — MRSA PCR SCREENING: MRSA by PCR: NEGATIVE

## 2017-05-17 LAB — APTT: aPTT: 33 seconds (ref 24–36)

## 2017-05-17 MED ORDER — CEFAZOLIN SODIUM-DEXTROSE 2-4 GM/100ML-% IV SOLN
2.0000 g | INTRAVENOUS | Status: AC
  Administered 2017-05-18: 2 g via INTRAVENOUS
  Filled 2017-05-17 (×2): qty 100

## 2017-05-17 MED ORDER — CEFAZOLIN SODIUM-DEXTROSE 2-4 GM/100ML-% IV SOLN: 2.0000 g | INTRAVENOUS | Status: DC

## 2017-05-17 MED ORDER — ENSURE ENLIVE PO LIQD: 237.0000 mL | Freq: Two times a day (BID) | ORAL | Status: DC

## 2017-05-17 MED ORDER — PROMETHAZINE HCL 25 MG/ML IJ SOLN: 6.2500 mg | Freq: Four times a day (QID) | INTRAMUSCULAR | Status: DC | PRN

## 2017-05-17 MED ORDER — INSULIN ASPART 100 UNIT/ML ~~LOC~~ SOLN: 0.0000 [IU] | SUBCUTANEOUS | Status: DC

## 2017-05-17 MED ORDER — PANTOPRAZOLE SODIUM 40 MG IV SOLR
40.0000 mg | Freq: Two times a day (BID) | INTRAVENOUS | Status: DC
  Administered 2017-05-17 – 2017-05-19 (×5): 40 mg via INTRAVENOUS
  Filled 2017-05-17 (×5): qty 40

## 2017-05-17 MED ORDER — SODIUM CHLORIDE 0.9% FLUSH
3.0000 mL | Freq: Two times a day (BID) | INTRAVENOUS | Status: DC
  Administered 2017-05-18 – 2017-05-19 (×2): 3 mL via INTRAVENOUS

## 2017-05-17 MED ORDER — MORPHINE SULFATE (PF) 4 MG/ML IV SOLN
1.0000 mg | INTRAVENOUS | Status: DC | PRN
  Administered 2017-05-18: 1 mg via INTRAVENOUS
  Filled 2017-05-17: qty 1

## 2017-05-17 MED ORDER — INSULIN ASPART 100 UNIT/ML ~~LOC~~ SOLN: 0.0000 [IU] | Freq: Every day | SUBCUTANEOUS | Status: DC

## 2017-05-17 MED ORDER — ACETAMINOPHEN 325 MG PO TABS: 650.0000 mg | ORAL_TABLET | Freq: Four times a day (QID) | ORAL | Status: DC | PRN

## 2017-05-17 MED ORDER — CALCITONIN (SALMON) 200 UNIT/ACT NA SOLN
1.0000 | Freq: Every day | NASAL | Status: DC
  Filled 2017-05-17: qty 3.7

## 2017-05-17 MED ORDER — HYDRALAZINE HCL 20 MG/ML IJ SOLN
10.0000 mg | Freq: Four times a day (QID) | INTRAMUSCULAR | Status: DC | PRN
  Administered 2017-05-17 – 2017-05-18 (×2): 10 mg via INTRAVENOUS
  Filled 2017-05-17 (×2): qty 1

## 2017-05-17 MED ORDER — ACETAMINOPHEN 650 MG RE SUPP: 650.0000 mg | Freq: Four times a day (QID) | RECTAL | Status: DC | PRN

## 2017-05-17 MED ORDER — METOPROLOL TARTRATE 5 MG/5ML IV SOLN
5.0000 mg | Freq: Four times a day (QID) | INTRAVENOUS | Status: DC
  Administered 2017-05-17 – 2017-05-18 (×6): 5 mg via INTRAVENOUS
  Filled 2017-05-17 (×6): qty 5

## 2017-05-17 MED ORDER — KETOROLAC TROMETHAMINE 15 MG/ML IJ SOLN
15.0000 mg | Freq: Three times a day (TID) | INTRAMUSCULAR | Status: DC
  Administered 2017-05-17 – 2017-05-19 (×7): 15 mg via INTRAVENOUS
  Filled 2017-05-17 (×7): qty 1

## 2017-05-17 MED ORDER — SODIUM CHLORIDE 0.9 % IV SOLN
INTRAVENOUS | Status: DC
  Administered 2017-05-17 (×2): via INTRAVENOUS

## 2017-05-17 MED ORDER — INSULIN ASPART 100 UNIT/ML ~~LOC~~ SOLN
0.0000 [IU] | Freq: Three times a day (TID) | SUBCUTANEOUS | Status: DC
  Administered 2017-05-17 – 2017-05-18 (×2): 2 [IU] via SUBCUTANEOUS
  Administered 2017-05-18 (×2): 1 [IU] via SUBCUTANEOUS
  Administered 2017-05-19: 2 [IU] via SUBCUTANEOUS

## 2017-05-17 MED ORDER — ONDANSETRON HCL 4 MG/2ML IJ SOLN: 4.0000 mg | Freq: Four times a day (QID) | INTRAMUSCULAR | Status: DC | PRN

## 2017-05-17 NOTE — Discharge Planning (Signed)
[facility] consulted regarding pt returning to SNF [facility]).  Family states they would like another facility. Pt will have to return to [facility] to transfer to a different facility. The other option is for pt to go home with home health services and be placed in a different SNF from home.  EDCM contacted SW to alert of pt/family concerns.  SW will discuss with family and come up with disposition plan.

## 2017-05-17 NOTE — Clinical Social Work Note (Signed)
CSW advised while patient in the ED that she would be admitted to hospital for a procedure (KP/VP). Preferred facility is [facility] and CSW confirmed with admissions director Colletta Maryland that they can accept patient. Patient transferred to [facility] contacted and verbal handoff provided. CSW signing off as patient now on 5W and social work intervention services will be provided by unit CSW.  Osamah Schmader Givens, MSW, LCSW Licensed Clinical Social Worker [place] [phone]

## 2017-05-17 NOTE — ED Notes (Signed)
Pt asleep at this time

## 2017-05-17 NOTE — Patient Care Conference (Signed)
Patient has BP 180/100 by mannual  given hydralazine 10 mg as an prn order. Recheck BP was 160/80 by  mannual given metoprolol 5 mg as an schedule order.

## 2017-05-17 NOTE — NC FL2 (Signed)
[place] LEVEL OF CARE SCREENING TOOL     IDENTIFICATION  Patient Name: [name] Birthdate: [birth year] Sex: female Admission Date (Current Location): 05/16/2017  [facility] and [place] Number:  Herbalist and Address:  [facility] [place]. [facility], [place] [address] [place],  [address]      Provider Number: [id number]  Attending Physician Name and Address:  Default, Provider, MD  Relative Name and Phone Number:  Celene Skeen - daughter; [phone]    Current Level of Care: Hospital (Patient in emergency dept.) Recommended Level of Care: [place] Prior Approval Number:    Date Approved/Denied:   [place] Number: [id number]  Discharge Plan: SNF    Current Diagnoses: Patient Active Problem List   Diagnosis Date Noted  . Back pain 05/15/2017  . Syncope 01/05/2017  . Tachycardia 01/04/2017  . CKD (chronic kidney disease) stage 3, GFR 30-59 ml/min 01/04/2017  . Diabetes mellitus with complication ([place]) 76/28/3151  . Peripheral neuropathy 01/04/2017  . Orthostatic hypotension 01/04/2017  . Fall 12/24/2016  . Acute encephalopathy 12/24/2016  . Depression 12/24/2016  . Acute renal failure superimposed on stage 3 chronic kidney disease ([place]) 12/24/2016  . Vertigo 05/16/2016  . Chest pain 05/15/2016  . Essential tremor 04/01/2015  . Chronic low back pain 04/01/2015  . Lumbar herniated disc 02/06/2015  . Lumbar radiculopathy 03/20/2014  . UTI (lower urinary tract infection) 03/18/2014  . Hyperglycemia 03/18/2014  . Dyslipidemia 03/18/2014  . Lumbar stenosis 03/16/2014  . HTN (hypertension) 02/12/2014  . Diabetes mellitus ([place]) 02/12/2014  . Acute renal failure ([place]) 02/11/2014  . H/O total knee replacement 08/11/2013  . Gallstones 10/01/2011    Orientation RESPIRATION BLADDER Height & Weight     Self, Time, Situation, Place  Normal Incontinent Weight:   Height:     BEHAVIORAL SYMPTOMS/MOOD NEUROLOGICAL BOWEL  NUTRITION STATUS      Continent Diet (Low sodium - Heart healthy)  AMBULATORY STATUS COMMUNICATION OF NEEDS Skin   Limited Assist (Min guard) Verbally Other (Comment) (Moisture associated skin damage to groin)                       Personal Care Assistance Level of Assistance  Bathing, Feeding, Dressing Bathing Assistance: Limited assistance Feeding assistance: Independent Dressing Assistance: Limited assistance     Functional Limitations Info    Sight Info: Impaired Hearing Info: Adequate Speech Info: Adequate    SPECIAL CARE FACTORS FREQUENCY  PT (By licensed PT)     PT Frequency: [place]auated 9/8 and a minimum of 2X per week therapy recommended              Contractures Contractures Info: Not present    Additional Factors Info  Code Status, Allergies Code Status Info: Full Allergies Info: Ciprofloxacin, Codeine, Dilaudid, Hydromorphone Hcl, Mysoline Primidone, Oxycodone           Current Medications (05/17/2017):  This is the current hospital active medication list No current facility-administered medications for this encounter.    Current Outpatient Prescriptions  Medication Sig Dispense Refill  . acetaminophen (TYLENOL) 500 MG tablet Take 1,000 mg by mouth every 6 (six) hours as needed for mild pain or headache.    . DULoxetine (CYMBALTA) 20 MG capsule Take 1 capsule (20 mg total) by mouth daily. 30 capsule 3  . HUMALOG MIX 75/25 KWIKPEN (75-25) 100 UNIT/ML Kwikpen Inject 15-60 Units into the skin 2 (two) times daily. Inject 60 units in the am and  Inject 15 in the pm.    . ibuprofen (ADVIL,MOTRIN) 400 MG tablet Take 1 tablet (400 mg total) by mouth every 8 (eight) hours as needed for moderate pain (back pain.). 30 tablet 0  . losartan (COZAAR) 50 MG tablet Take 50 mg by mouth daily.    . midodrine (PROAMATINE) 5 MG tablet Take 5 mg by mouth 2 (two) times daily.    [name] oxyCODONE-acetaminophen (PERCOCET/ROXICET) 5-325 MG tablet Take 1 tablet by mouth every 6 (six)  hours as needed for moderate pain or severe pain. 15 tablet 0  . propranolol (INDERAL) 10 MG tablet Take 20 mg by mouth 2 (two) times daily.     . traMADol (ULTRAM) 50 MG tablet Take 50 mg by mouth 2 (two) times daily.    . polyethylene glycol (MIRALAX / GLYCOLAX) packet Take 17 g by mouth daily. (Patient not taking: Reported on 05/16/2017) 14 each 0     Discharge Medications: Please see discharge summary for a list of discharge medications.  Relevant Imaging Results:  Relevant Lab Results:   Additional Information [id number]  Sable Feil, LCSW

## 2017-05-17 NOTE — Evaluation (Signed)
Physical Therapy Evaluation Patient Details Name: [name] MRN: [id number] DOB: 1933-04-20 Today's Date: 05/17/2017   History of Present Illness  Pt is an 83 y/o female presenting to ED secondary to back pain. Pt d/c'd yesterday to [facility] after admission for L1 compression fx and R L4-5 severe foraminal narrowing. PMH includes skin cancer, HTN, DM, peripheral neuropathy, DDD, and s/p R TKA.   Clinical Impression  Pt presenting to ED with problem below and deficits above. Pt recently admitted secondary to L1 compression fracture and d/c'd to [facility], however, returned secondary to increased back pain. Upon eval, pt extremely limited by back and R hip pain. Pt also presenting with decreased strength and balance. Pt only tolerated short ambulation distance in room and required min to mod A for mobility. Recommending SNF at d/c to increase independence and safety with functional mobility. Pt reporting she does not want to go to [facility]. Will continue to follow acutely to maximize functional mobility independence and safety.      Follow Up Recommendations SNF    Equipment Recommendations  None recommended by PT    Recommendations for Other Services       Precautions / Restrictions Precautions Precautions: Back;Fall Precaution Booklet Issued: No Precaution Comments: Reviewed back precautions with pt.  Restrictions Weight Bearing Restrictions: No      Mobility  Bed Mobility Overal bed mobility: Needs Assistance Bed Mobility: Supine to Sit;Sit to Supine     Supine to sit: Min guard Sit to supine: Mod assist   General bed mobility comments: Educated about use of log roll technique to decrease back pain, however, pt refusing secondary to increased hip pain and wanting to perform how she normally does. Relied heavily on bed rails for trunk elevation. Required mod A for LE lift assist upon return to bed.   Transfers Overall transfer level: Needs assistance Equipment  used: Rolling walker (2 wheeled);1 person hand held assist Transfers: Sit to/from Stand Sit to Stand: Min assist         General transfer comment: Min A for lift assist and steadying assist. Pt shaky upon standing.   Ambulation/Gait Ambulation/Gait assistance: Min assist Ambulation Distance (Feet): 5 Feet Assistive device: Rolling walker (2 wheeled);1 person hand held assist Gait Pattern/deviations: Step-through pattern;Decreased stride length;Wide base of support;Drifts right/left Gait velocity: Decreased Gait velocity interpretation: Below normal speed for age/gender General Gait Details: Very limited gait tolerance secondary to back pain and L hip pain. Shakiness throughout, requiring min A for steadying.   Stairs            Wheelchair Mobility    Modified Rankin (Stroke Patients Only)       Balance Overall balance assessment: History of Falls;Needs assistance Sitting-balance support: Feet supported Sitting balance-Leahy Scale: Fair     Standing balance support: Bilateral upper extremity supported;During functional activity Standing balance-Leahy Scale: Poor Standing balance comment: Reliant on RW for stability                              Pertinent Vitals/Pain Pain Assessment: 0-10 Pain Score: 7  Pain Location: Back and R hip  Pain Descriptors / Indicators: Aching;Sore Pain Intervention(s): Limited activity within patient's tolerance;Monitored during session;Repositioned    Home Living Family/patient expects to be discharged to:: Skilled nursing facility                 Additional Comments: Previously went to [facility], however, does not  want to go back to [facility].     Prior Function Level of Independence: Needs assistance   Gait / Transfers Assistance Needed: rollator usually but has RW and SPC  ADL's / Homemaking Assistance Needed: family for errands, self care assistance        Hand Dominance   Dominant Hand:  Right    Extremity/Trunk Assessment   Upper Extremity Assessment Upper Extremity Assessment: Overall WFL for tasks assessed    Lower Extremity Assessment Lower Extremity Assessment: Generalized weakness;RLE deficits/detail RLE Deficits / Details: R hip pain reported since falling     Cervical / Trunk Assessment Cervical / Trunk Assessment: Kyphotic  Communication   Communication: No difficulties  Cognition Arousal/Alertness: Awake/alert Behavior During Therapy: WFL for tasks assessed/performed Overall Cognitive Status: Within Functional Limits for tasks assessed                                        General Comments General comments (skin integrity, edema, etc.): Pt's family present throughout session. Educated about continued need for SNF and pt reporting she does not want to go to [facility]. Educated about importance of mobility and walking program.     Exercises     Assessment/Plan    PT Assessment Patient needs continued PT services  PT Problem List Decreased strength;Decreased range of motion;Decreased activity tolerance;Decreased balance;Decreased mobility;Decreased coordination;Obesity;Pain;Decreased safety awareness       PT Treatment Interventions DME instruction;Gait training;Functional mobility training;Therapeutic activities;Therapeutic exercise;Balance training;Neuromuscular re-education;Patient/family education    PT Goals (Current goals can be found in the Care Plan section)  Acute Rehab PT Goals Patient Stated Goal: to stop having falls PT Goal Formulation: With patient Time For Goal Achievement: 05/31/17 Potential to Achieve Goals: Good    Frequency Min 2X/week   Barriers to discharge Decreased caregiver support lives alone     Co-evaluation               AM-PAC PT "6 Clicks" Daily Activity  Outcome Measure Difficulty turning over in bed (including adjusting bedclothes, sheets and blankets)?: Unable Difficulty moving  from lying on back to sitting on the side of the bed? : Unable Difficulty sitting down on and standing up from a chair with arms (e.g., wheelchair, bedside commode, etc,.)?: Unable Help needed moving to and from a bed to chair (including a wheelchair)?: A Little Help needed walking in hospital room?: A Little Help needed climbing 3-5 steps with a railing? : A Lot 6 Click Score: 11    End of Session Equipment Utilized During Treatment: Gait belt Activity Tolerance: Patient limited by pain Patient left: in bed;with call bell/phone within reach;with family/visitor present Nurse Communication: Mobility status PT Visit Diagnosis: Unsteadiness on feet (R26.81);Repeated falls (R29.6);Muscle weakness (generalized) (M62.81);Difficulty in walking, not elsewhere classified (R26.2);Pain Pain - Right/Left: Right Pain - part of body: Hip (back )    Time: 1114-1130 PT Time Calculation (min) (ACUTE ONLY): 16 min   Charges:   PT Evaluation $PT Eval Moderate Complexity: 1 Mod     PT G Codes:   PT G-Codes **NOT FOR INPATIENT CLASS** Functional Assessment Tool Used: AM-PAC 6 Clicks Basic Mobility;Clinical judgement Functional Limitation: Mobility: Walking and moving around Mobility: Walking and Moving Around Current Status (Z6109): At least 60 percent but less than 80 percent impaired, limited or restricted Mobility: Walking and Moving Around Goal Status [id number]): At least 20 percent but less than 40  percent impaired, limited or restricted    Leighton Ruff, PT, DPT  Acute Rehabilitation Services  Pager: [place] 05/17/2017, 11:45 AM

## 2017-05-17 NOTE — Consult Note (Signed)
Chief Complaint: L1 compression fracture  Referring Physician:Dr. Linna Darner  Supervising Physician: Luanne Bras  Patient Status: [facility] - In-pt  HPI: [name] is a 83 y.o. female who has chronic back pain, but fell about 10 days ago and has developed acutely worsening lower back pain.  She came to the ED about 3 days ago and had an MRI of her lumbar spine completed which revealed a new acute L1 compression fracture.  She was discharged to SNF from normally being at home due to her limited ability to care for herself now that she has this new fracture.  She was brought back today secondary to persistent pain and feeling like she wasn't getting adequate care where she came.  She denies any urinary symptoms or other complaints.  She admits to urinary frequency and incontinence which is her norm.  We have been asked to see her for consideration of KP/VP secondary to back pain.  Past Medical History:  Past Medical History:  Diagnosis Date  . Anemia    as a teenager  . Arthritis   . Cancer ([place])    skin  . Cholelithiasis   . Chronic insomnia   . Chronic low back pain   . Complication of anesthesia   . DDD (degenerative disc disease), lumbar   . Diabetes mellitus    Iddm x 12 years  . Diabetic peripheral neuropathy ([place])   . Essential tremor 04/01/2015  . GERD (gastroesophageal reflux disease)   . Hx of acute renal failure 02/2014   admitted to [facility]  . Hx: UTI (urinary tract infection) 02/2014  . Hyperlipemia   . Hypertension   . Obesity   . Peripheral edema   . Pneumonia    as a child  . PONV (postoperative nausea and vomiting)    pt states Zofran not that helpful in the past, Phenergan works best for her  . Staph infection    1970's  . Umbilical hernia   . Wrist fracture    left October 2014    Past Surgical History:  Past Surgical History:  Procedure Laterality Date  . ABDOMINAL HYSTERECTOMY    . BACK SURGERY  2005  . CARDIAC CATHETERIZATION   2000   Dr Einar Gip  . CATARACT EXTRACTION Bilateral    w/ lens implants  . COLONOSCOPY  >10 years  . EYE SURGERY    . HERNIA REPAIR    . LUMBAR LAMINECTOMY/DECOMPRESSION MICRODISCECTOMY Left 03/16/2014   Procedure: LUMBAR LAMINECTOMY/DECOMPRESSION MICRODISCECTOMY 1 LEVEL  lumbar level three/four;  Surgeon: [name], MD;  Location: [facility];  Service: Neurosurgery;  Laterality: Left;  . LUMBAR LAMINECTOMY/DECOMPRESSION MICRODISCECTOMY Right 02/06/2015   Procedure: Right L4-5 Microdiskectomy;  Surgeon: Leeroy Cha, MD;  Location: [facility];  Service: Neurosurgery;  Laterality: Right;  Right L4-5 Microdiskectomy  . NECK SURGERY    . OOPHORECTOMY    . TOTAL KNEE ARTHROPLASTY Right     Family History:  Family History  Problem Relation Age of Onset  . Cancer Brother        prostate  . Heart disease Brother   . Alzheimer's disease Brother   . Stroke Mother   . Hypertension Mother   . COPD Brother   . Cancer Brother        Prostate cancer  . Hypertension Sister        Congestive heart failure  . Heart attack Son     Social History:  reports that she has never  smoked. She has never used smokeless tobacco. She reports that she does not drink alcohol or use drugs.  Allergies:  Allergies  Allergen Reactions  . Ciprofloxacin Other (See Comments)    Acute kidney failure  . Codeine Nausea And Vomiting  . Dilaudid [Hydromorphone Hcl] Nausea And Vomiting and Other (See Comments)    Severe sweating  . Mysoline [Primidone] Nausea Only  . Oxycodone Nausea And Vomiting    Medications: Medications reviewed in epic  Please HPI for pertinent positives, otherwise complete 10 system ROS negative.  Mallampati Score: MD Evaluation Airway: WNL Heart: WNL Abdomen: WNL Chest/ Lungs: WNL ASA  Classification: 3 Mallampati/Airway Score: Two  Physical Exam: BP (!) 185/89 (BP Location: Right Arm)   Pulse 92   Temp 98.1 F (36.7 C) (Oral)   Resp 16   SpO2 96%  There is no height or  weight on file to calculate BMI. General: elderly, pleasant, WD, WN white female who is laying in bed in NAD HEENT: head is normocephalic, atraumatic.  Sclera are noninjected.  PERRL.  Ears and nose without any masses or lesions.  Mouth is pink and moist Heart: regular, rate, and rhythm.  Normal s1,s2. No obvious murmurs, gallops, or rubs noted.  Palpable radial and pedal pulses bilaterally Lungs: CTAB, no wheezes, rhonchi, or rales noted.  Respiratory effort nonlabored Abd: soft, NT, ND, +BS, no masses or organomegaly.  Reducible umbilical hernia Back: tender in lower back Psych: A&Ox3 with an appropriate affect.   Labs: Results for orders placed or performed during the hospital encounter of 05/16/17 (from the past 48 hour(s))  Protime-INR     Status: None   Collection Time: 05/17/17 11:35 AM  Result Value Ref Range   Prothrombin Time 13.8 11.4 - 15.2 seconds   INR 1.06     Imaging: No results found.  Assessment/Plan 1. L1 compression fracture  We will tentatively plan for KP/VP tomorrow of L1 compression fracture.  Insurance authorization is pending along with a UA.  Her last UA was questionable.  Dr. Estanislado Pandy will want this to be rechecked and clear prior to proceeding with a foreign body placement of cement.  She will be prepared for tomorrow and if not then it will likely be the end of the week (th,F) prior to being able to proceed.  This was discussed with the patient and she is agreeable and understands.  Thank you for this interesting consult.  I greatly enjoyed meeting CHRISTIANA GUREVICH and look forward to participating in their care.  A copy of this report was sent to the requesting provider on this date.  Electronically Signed: Henreitta Cea 05/17/2017, 12:53 PM   I spent a total of 40 Minutes    in face to face in clinical consultation, greater than 50% of which was counseling/coordinating care for L1 compression fracture

## 2017-05-17 NOTE — ED Notes (Signed)
Pt attempted to urinate with female urinal, unable to void. Will round on pt in approx 20 minutes.

## 2017-05-17 NOTE — Clinical Social Work Note (Signed)
Clinical Social Work Assessment  Patient Details  Name: [name] MRN: [id number] Date of Birth: 1932/10/10  Date of referral:  05/17/17               Reason for consult:  Facility Placement                Permission sought to share information with:  Family Supports Permission granted to share information::  No (Verbal permission not given, however patient allowed CSW to talk with daughter in the ED)  Name::     Celene Skeen  Agency::     Relationship::  Daughter  Contact Information:  [phone]  Housing/Transportation Living arrangements for the past 2 months:  Apartment Source of Information:  Adult Children (Daughter Chief Financial Officer) Patient Interpreter Needed:  None Criminal Activity/Legal Involvement Pertinent to Current Situation/Hospitalization:  No - Comment as needed Significant Relationships:  Adult Children, Other Family Members Lives with:  Self Do you feel safe going back to the place where you live?  No (Family in agreement with patient going to rehab at discharge as she is weakened and lives alone) Need for family participation in patient care:  Yes (Comment)  Care giving concerns:  Per daughter, patient lives alone and although family lives nearby, she needs rehab before returning home, as family cannot be with patient 24/7.  Social Worker assessment / plan:  CSW received consult while patient was in the ED for SNF placement. Talked with daughter at the door of patient's room regarding request for SNF placement. Patient was sitting up in bed and was alert and oriented and responded to CSW's  Greeting. Daughter reported that her mother lives alone and she and her brother Abie Killian also live in [place], but on another side of town from their mother. Per daughter, their mother just d/c'd from the hospital to [facility], however they were not happy with the care and had  patient brought back to the hospital. Daughter is requesting [facility] as she and her  brother live on that side of town and have heard good things about this facility.   Call made to [facility], admissions director at [facility] regarding patient and after reviewing clinicals, indicated that they could take patient today. CSW later learned that patient was being admitted to hospital and this information was also relayed to [facility] who indicated that  they should be able to accept patient upon discharge from hospital. Daughter was aware that her mother was going to be admitted to hospital and was pleased to know that [facility] could accept her upon discharge.  Employment status:  Retired Forensic scientist:  Glass blower/designer) PT Recommendations:  [place] / Referral to community resources:  [place] (Family provided CSW with facility preference)  Patient/Family's Response to care:  No concerns expressed by family regarding patient's care in ED.  Patient/Family's Understanding of and Emotional Response to Diagnosis, Current Treatment, and Prognosis:  Family was at the bedside and was kept appraised of patient's condition and need for a procedure that caused her to be admitted to hospital.  Emotional Assessment Appearance:  Appears stated age Attitude/Demeanor/Rapport:  Other (Appropriate) Affect (typically observed):  Appropriate, Pleasant Orientation:  Oriented to Self, Oriented to Place, Oriented to  Time, Oriented to Situation Alcohol / Substance use:  Tobacco Use, Alcohol Use, Illicit Drugs (Patient reported that she has never smoked, drank or used illicit drugs) Psych involvement (Current and /or in the community):  No (Comment)  Discharge  Needs  Concerns to be addressed:  Discharge Planning Concerns Readmission within the last 30 days:  Yes Current discharge risk:  None Barriers to Discharge:  No Barriers Identified   Sable Feil, LCSW 05/17/2017, 2:52 PM

## 2017-05-17 NOTE — ED Notes (Signed)
Pt on bedside commode at this time 

## 2017-05-17 NOTE — Clinical Social Work Placement (Signed)
   CLINICAL SOCIAL WORK PLACEMENT  NOTE  Date:  05/17/2017  Patient Details  Name: [name] MRN: [id number] Date of Birth: 1932/12/28  Clinical Social Work is seeking post-discharge placement for this patient at the [place] level of care (*CSW will initial, date and re-position this form in  chart as items are completed):  No (Daughter provided CSW with facility preference)   Patient/family provided with [place] Work Department's list of facilities offering this level of care within the geographic area requested by the patient (or if unable, by the patient's family).  Yes   Patient/family informed of their freedom to choose among providers that offer the needed level of care, that participate in Medicare, Medicaid or managed care program needed by the patient, have an available bed and are willing to accept the patient.  No   Patient/family informed of [place]'s ownership interest in [facility] and [facility], as well as of the fact that they are under no obligation to receive care at these facilities.  PASRR submitted to EDS on       PASRR number received on       Existing PASRR number confirmed on 05/17/17     FL2 transmitted to all facilities in geographic area requested by pt/family on 05/17/17     FL2 transmitted to all facilities within larger geographic area on       Patient informed that his/her managed care company has contracts with or will negotiate with certain facilities, including the following:        Yes ([employer])   Patient/family informed of bed offers received.  Patient chooses bed at       Physician recommends and patient chooses bed at      Patient to be transferred to   on  .  Patient to be transferred to facility by       Patient family notified on   of transfer.  Name of family member notified:        PHYSICIAN       Additional Comment:     _______________________________________________ Sable Feil, LCSW 05/17/2017, 3:07 PM

## 2017-05-17 NOTE — ED Provider Notes (Signed)
PROGRESS NOTE                                                                                                                 This is a sign-out at shift change: [name] is a 83 y.o. female presenting with request to be transferred from SNF. Family is upset that patient was not helped to the bathroom appropriately. Per family they had asked that the patient be helped to the restroom they were told that the patient could not be moved until she was evaluated by physical therapy and that wouldn't be for several they are requesting placement in a different facility. Plan is to consult case management in the morning.  Please refer to previous note for full HPI, ROS, PMH and PE.   Case management has evaluated this patient states that the patient cannot be placed emergently from the ED into another SNF she will have to return to the original facility and request that she be transferred there.  Discussed with interventional radiology Dr. Reesa Chew who states that this patient is a candidate for kyphoplasty.  Discussed with triad hospitalist who accepts admission.      Monico Blitz, Hershal Coria 05/17/17 1126    Gareth Morgan, MD 05/19/17 1201

## 2017-05-17 NOTE — ED Notes (Signed)
PT at bedside.

## 2017-05-17 NOTE — H&P (Signed)
History and Physical    [name] QPY:[id number] DOB: 22-Aug-1933 DOA: 05/16/2017   PCP: Aretta Nip, MD   Attending physician: Marily Memos  Patient coming from/Resides with[facility]  Chief Complaint: Intractable back pain  HPI: [name] is a 83 y.o. female with medical history significant for hypertension, left ventricular*dysfunction with preserved systolic function, diabetes on insulin, dyslipidemia, GERD, anemia, and osteoarthritis. Patient was discharged on 9/9 after a brief admission for intractable back pain secondary to acute L1 compression fracture diagnosed on MRI. Patient arrived to the nursing facility but was unable to ambulate and was having difficult to control pain. Family had patient brought back to ER. IR was consulted and plans are to pursue kyphoplasty during this admission. Family also dissatisfied with the care given at previous SNF so SW has been consulted to assist in finding a new facility.  ED Course:  Vital Signs: BP (!) 185/89 (BP Location: Right Arm)   Pulse 92   Temp 98.1 F (36.7 C) (Oral)   Resp 16   SpO2 96%  Labs: None Medications and treatments: None  Review of Systems:  In addition to the HPI above,  No Fever-chills, myalgias or other constitutional symptoms No Headache, changes with Vision or hearing, new weakness, tingling, numbness in any extremity, dizziness, dysarthria or word finding difficulty, gait disturbance or imbalance, tremors or seizure activity No problems swallowing food or Liquids, indigestion/reflux, choking or coughing while eating, abdominal pain with or after eating No Chest pain, Cough or Shortness of Breath, palpitations, orthopnea or DOE No Abdominal pain, N/V, melena,hematochezia, dark tarry stools, constipation No dysuria, malodorous urine, hematuria or flank pain No new skin rashes, lesions, masses or bruises, No new joint pains, aches, swelling or redness--no improvement in previously diagnosed L1  compression fracture back pain No recent unintentional weight gain or loss No polyuria, polydypsia or polyphagia   Past Medical History:  Diagnosis Date  . Anemia    as a teenager  . Arthritis   . Cancer ([place])    skin  . Cholelithiasis   . Chronic insomnia   . Chronic low back pain   . Complication of anesthesia   . DDD (degenerative disc disease), lumbar   . Diabetes mellitus    Iddm x 12 years  . Diabetic peripheral neuropathy ([place])   . Essential tremor 04/01/2015  . GERD (gastroesophageal reflux disease)   . Hx of acute renal failure 02/2014   admitted to [facility]  . Hx: UTI (urinary tract infection) 02/2014  . Hyperlipemia   . Hypertension   . Obesity   . Peripheral edema   . Pneumonia    as a child  . PONV (postoperative nausea and vomiting)    pt states Zofran not that helpful in the past, Phenergan works best for her  . Staph infection    1970's  . Umbilical hernia   . Wrist fracture    left October 2014    Past Surgical History:  Procedure Laterality Date  . ABDOMINAL HYSTERECTOMY    . BACK SURGERY  2005  . CARDIAC CATHETERIZATION  2000   Dr Einar Gip  . CATARACT EXTRACTION Bilateral    w/ lens implants  . COLONOSCOPY  >10 years  . EYE SURGERY    . HERNIA REPAIR    . LUMBAR LAMINECTOMY/DECOMPRESSION MICRODISCECTOMY Left 03/16/2014   Procedure: LUMBAR LAMINECTOMY/DECOMPRESSION MICRODISCECTOMY 1 LEVEL  lumbar level three/four;  Surgeon: [name], MD;  Location: [facility];  Service: Neurosurgery;  Laterality: Left;  . LUMBAR LAMINECTOMY/DECOMPRESSION MICRODISCECTOMY Right 02/06/2015   Procedure: Right L4-5 Microdiskectomy;  Surgeon: Leeroy Cha, MD;  Location: [facility];  Service: Neurosurgery;  Laterality: Right;  Right L4-5 Microdiskectomy  . NECK SURGERY    . OOPHORECTOMY    . TOTAL KNEE ARTHROPLASTY Right     Social History   Social History  . Marital status: Widowed    Spouse name: N/A  . Number of children: 3  . Years of education: 91    Occupational History  . Retired     Social History Main Topics  . Smoking status: Never Smoker  . Smokeless tobacco: Never Used  . Alcohol use No  . Drug use: No  . Sexual activity: No   Other Topics Concern  . Not on file   Social History Narrative   Patient lives at home alone.    Patient is widowed.    Patient has 3 children and 2 step children.    Patient is right handed.    Patient has a high school education and CNA certificate.     Patient is retired.    Patient does not drink caffeine.    Mobility: Max assist with rolling walker secondary to acute back pain as described above Work history: Not obtained   Allergies  Allergen Reactions  . Ciprofloxacin Other (See Comments)    Acute kidney failure  . Codeine Nausea And Vomiting  . Dilaudid [Hydromorphone Hcl] Nausea And Vomiting and Other (See Comments)    Severe sweating  . Mysoline [Primidone] Nausea Only  . Oxycodone Nausea And Vomiting    Family History  Problem Relation Age of Onset  . Cancer Brother        prostate  . Heart disease Brother   . Alzheimer's disease Brother   . Stroke Mother   . Hypertension Mother   . COPD Brother   . Cancer Brother        Prostate cancer  . Hypertension Sister        Congestive heart failure  . Heart attack Son      Prior to Admission medications   Medication Sig Start Date End Date Taking? Authorizing Provider  acetaminophen (TYLENOL) 500 MG tablet Take 1,000 mg by mouth every 6 (six) hours as needed for mild pain or headache.   Yes [provider]  DULoxetine (CYMBALTA) 20 MG capsule Take 1 capsule (20 mg total) by mouth daily. 01/25/17  Yes Kathrynn Ducking, MD  HUMALOG MIX 75/25 KWIKPEN (75-25) 100 UNIT/ML Kwikpen Inject 15-60 Units into the skin 2 (two) times daily. Inject 60 units in the am and Inject 15 in the pm. 06/04/15  Yes [provider]  ibuprofen (ADVIL,MOTRIN) 400 MG tablet Take 1 tablet (400 mg total) by mouth every 8 (eight)  hours as needed for moderate pain (back pain.). 05/16/17  Yes Arrien, Jimmy Picket, MD  losartan (COZAAR) 50 MG tablet Take 50 mg by mouth daily. 04/28/17  Yes [provider]  midodrine (PROAMATINE) 5 MG tablet Take 5 mg by mouth 2 (two) times daily. 05/03/17  Yes [provider]  oxyCODONE-acetaminophen (PERCOCET/ROXICET) 5-325 MG tablet Take 1 tablet by mouth every 6 (six) hours as needed for moderate pain or severe pain. 05/16/17  Yes Arrien, Jimmy Picket, MD  propranolol (INDERAL) 10 MG tablet Take 20 mg by mouth 2 (two) times daily.  03/18/17  Yes [provider]  traMADol (ULTRAM) 50 MG tablet Take 50 mg by mouth 2 (  two) times daily. 05/07/17  Yes [provider]  polyethylene glycol (MIRALAX / GLYCOLAX) packet Take 17 g by mouth daily. Patient not taking: Reported on 05/16/2017 05/17/17   Arrien, Jimmy Picket, MD    Physical Exam: Vitals:   05/16/17 2109 05/17/17 0327  BP: (!) 185/89 (!) 185/89  Pulse: 90 92  Resp: 18 16  Temp: 98.1 F (36.7 C)   TempSrc: Oral   SpO2: 95% 96%      Constitutional: NAD, calm, uncomfortable 2/2Ongoing back pain Eyes: PERRL, lids and conjunctivae normal ENMT: Mucous membranes are dry. Posterior pharynx clear of any exudate or lesions. Age appropriate dentition.  Neck: normal, supple, no masses, no thyromegaly Respiratory: clear to auscultation bilaterally, no wheezing, no crackles. Normal respiratory effort. No accessory muscle use.  Cardiovascular: Regular rate and rhythm, no murmurs / rubs / gallops. No extremity edema. 2+ pedal pulses. No carotid bruits.  Abdomen: no tenderness, no masses palpated. No hepatosplenomegaly. Bowel sounds positive.  Musculoskeletal: no clubbing / cyanosis. No joint deformity upper and lower extremities. Good ROM except for lower extremities which are limited by pain in low back, no contractures. Normal muscle tone.  Skin: no rashes, lesions, ulcers. No induration Neurologic: CN  2-12 grossly intact. Sensation intact, DTR normal. Strength 5/5 in upper extremities. Strength 4-5 and lower extremities and is influenced by low back pain with SLR left leg causing greater pain then SLR right leg Psychiatric: Normal judgment and insight. Alert and oriented x 3. Normal mood.    Labs on Admission: I have personally reviewed following labs and imaging studies  CBC:  Recent Labs Lab 05/14/17 1646 05/15/17 0529  WBC 11.2* 9.6  NEUTROABS 7.2  --   HGB 14.3 12.0  HCT 42.1 37.3  MCV 82.4 83.6  PLT 248 382   Basic Metabolic Panel:  Recent Labs Lab 05/14/17 1646  NA 140  K 3.8  CL 102  CO2 29  GLUCOSE 45*  BUN 22*  CREATININE 0.85  CALCIUM 9.8   GFR: Estimated Creatinine Clearance: 63.2 mL/min (by C-G formula based on SCr of 0.85 mg/dL). Liver Function Tests:  Recent Labs Lab 05/14/17 1646  AST 18  ALT 12*  ALKPHOS 89  BILITOT 0.7  PROT 7.9  ALBUMIN 4.1    Recent Labs Lab 05/14/17 1646  LIPASE 22   No results for input(s): AMMONIA in the last 168 hours. Coagulation Profile: No results for input(s): INR, PROTIME in the last 168 hours. Cardiac Enzymes: No results for input(s): CKTOTAL, CKMB, CKMBINDEX, TROPONINI in the last 168 hours. BNP (last 3 results) No results for input(s): PROBNP in the last 8760 hours. HbA1C: No results for input(s): HGBA1C in the last 72 hours. CBG:  Recent Labs Lab 05/15/17 1131 05/15/17 1700 05/15/17 2019 05/16/17 0739 05/16/17 1242  GLUCAP 150* 148* 185* 183* 123*   Lipid Profile: No results for input(s): CHOL, HDL, LDLCALC, TRIG, CHOLHDL, LDLDIRECT in the last 72 hours. Thyroid Function Tests: No results for input(s): TSH, T4TOTAL, FREET4, T3FREE, THYROIDAB in the last 72 hours. Anemia Panel: No results for input(s): VITAMINB12, FOLATE, FERRITIN, TIBC, IRON, RETICCTPCT in the last 72 hours. Urine analysis:    Component Value Date/Time   COLORURINE YELLOW 05/14/2017 1646   APPEARANCEUR HAZY (A)  05/14/2017 1646   LABSPEC 1.022 05/14/2017 1646   PHURINE 6.0 05/14/2017 1646   GLUCOSEU >=500 (A) 05/14/2017 1646   HGBUR NEGATIVE 05/14/2017 1646   BILIRUBINUR NEGATIVE 05/14/2017 1646   KETONESUR NEGATIVE 05/14/2017 1646   PROTEINUR  100 (A) 05/14/2017 1646   UROBILINOGEN 0.2 05/03/2014 0942   NITRITE NEGATIVE 05/14/2017 1646   LEUKOCYTESUR NEGATIVE 05/14/2017 1646   Sepsis Labs: @LABRCNTIP (procalcitonin:4,lacticidven:4) )No results found for this or any previous visit (from the past 240 hour(s)).   Radiological Exams on Admission: No results found.   Assessment/Plan Principal Problem:   Intractable back pain 2/2 Closed compression fracture of L1 lumbar vertebra  -Recently discharged to SNF after admission for intractable back pain (as above) with pain controlled during inpatient stay on IV Dilaudid and on date of discharge transitioned to Percocet with noted inability to ambulate after arrival to nursing facility -No neurological deficits appreciated on exam -Initial plan was to adjust pain medications and attempt to return to SNF; option of kyphoplasty had not been explored during previous admission therefore I asked EDP to contact IR and plans are to pursue kyphoplasty this admission (obtain BMET, CBC, PT/INR & PTT) -NPO until evaluated by IR-NS IV fluids @ 75/hr until diet allowed -Scheduled IV Toradol every 8 hours with IV PPI every 12 hours -IV morphine prn-titrate up as tolerated noting patient does have some nausea with this class of medication -Zofran/low-dose Phenergan for nausea -PT evaluation post-kyphoplasty -SW aware family wishes to have patient discharged to different SNF when medically stable  Active Problems:   Left ventricular diastolic dysfunction with preserved systolic function -No prior history of congestive heart failure -Controlled blood pressure    Diabetes mellitus, insulin dependent (IDDM), controlled  -On 75/25 insulin at home-hold until diet  resumed -SSI -HgbA1c was 8.2 in May    Hypertension -Poorly controlled in setting of intractable pain -NPO so holding oral medications -Scheduled IV Lopressor -IV hydralazine prn    GERD (gastroesophageal reflux disease) -PPI as above    Hyperlipemia -Diet controlled noting not on statin prior to arrival      DVT prophylaxis: SCDs  Code Status: Full Family Communication: Multiple family members at bedside  Disposition Plan: SNF Consults called: Interventional radiology/[place]    Kayliee Atienza L. ANP-BC [facility] Pager [phone]   If 7PM-7AM, please contact night-coverage [email or web address] Password [email or web address]  05/17/2017, 11:52 AM

## 2017-05-17 NOTE — ED Notes (Signed)
Per Dr. Laverta Baltimore, pt to board in the ED for care management and social work consult in the AM. Family aware of POC. Pt verbalized understanding.

## 2017-05-17 NOTE — ED Notes (Signed)
Pt voided using female urinal.

## 2017-05-17 NOTE — ED Provider Notes (Signed)
[facility] DEPT Provider Note   CSN: [id number] Arrival date & time: 05/16/17  2106     History   Chief Complaint Chief Complaint  Patient presents with  . rehab needs    HPI [name] is a 83 y.o. female.  HPI Patient presents to the emergency department with complaints that she is not able to stay at the nursing facility.  The family states that the nursing facility stated that they were not able to help her go to the bathroom.  The family states that the patient cannot stay there.  Nobody can help her to the bathroom.  Patient has no complaints at this time Past Medical History:  Diagnosis Date  . Anemia    as a teenager  . Arthritis   . Cancer ([place])    skin  . Cholelithiasis   . Chronic insomnia   . Chronic low back pain   . Complication of anesthesia   . DDD (degenerative disc disease), lumbar   . Diabetes mellitus    Iddm x 12 years  . Diabetic peripheral neuropathy ([place])   . Essential tremor 04/01/2015  . GERD (gastroesophageal reflux disease)   . Hx of acute renal failure 02/2014   admitted to [facility]  . Hx: UTI (urinary tract infection) 02/2014  . Hyperlipemia   . Hypertension   . Obesity   . Peripheral edema   . Pneumonia    as a child  . PONV (postoperative nausea and vomiting)    pt states Zofran not that helpful in the past, Phenergan works best for her  . Staph infection    1970's  . Umbilical hernia   . Wrist fracture    left October 2014    Patient Active Problem List   Diagnosis Date Noted  . Back pain 05/15/2017  . Syncope 01/05/2017  . Tachycardia 01/04/2017  . CKD (chronic kidney disease) stage 3, GFR 30-59 ml/min 01/04/2017  . Diabetes mellitus with complication ([place]) 40/98/1191  . Peripheral neuropathy 01/04/2017  . Orthostatic hypotension 01/04/2017  . Fall 12/24/2016  . Acute encephalopathy 12/24/2016  . Depression 12/24/2016  . Acute renal failure superimposed on stage 3 chronic kidney disease ([place]) 12/24/2016  .  Vertigo 05/16/2016  . Chest pain 05/15/2016  . Essential tremor 04/01/2015  . Chronic low back pain 04/01/2015  . Lumbar herniated disc 02/06/2015  . Lumbar radiculopathy 03/20/2014  . UTI (lower urinary tract infection) 03/18/2014  . Hyperglycemia 03/18/2014  . Dyslipidemia 03/18/2014  . Lumbar stenosis 03/16/2014  . HTN (hypertension) 02/12/2014  . Diabetes mellitus ([place]) 02/12/2014  . Acute renal failure ([place]) 02/11/2014  . H/O total knee replacement 08/11/2013  . Gallstones 10/01/2011    Past Surgical History:  Procedure Laterality Date  . ABDOMINAL HYSTERECTOMY    . BACK SURGERY  2005  . CARDIAC CATHETERIZATION  2000   Dr Einar Gip  . CATARACT EXTRACTION Bilateral    w/ lens implants  . COLONOSCOPY  >10 years  . EYE SURGERY    . HERNIA REPAIR    . LUMBAR LAMINECTOMY/DECOMPRESSION MICRODISCECTOMY Left 03/16/2014   Procedure: LUMBAR LAMINECTOMY/DECOMPRESSION MICRODISCECTOMY 1 LEVEL  lumbar level three/four;  Surgeon: [name], MD;  Location: [facility];  Service: Neurosurgery;  Laterality: Left;  . LUMBAR LAMINECTOMY/DECOMPRESSION MICRODISCECTOMY Right 02/06/2015   Procedure: Right L4-5 Microdiskectomy;  Surgeon: Leeroy Cha, MD;  Location: [facility];  Service: Neurosurgery;  Laterality: Right;  Right L4-5 Microdiskectomy  . NECK SURGERY    . OOPHORECTOMY    .  TOTAL KNEE ARTHROPLASTY Right     OB History    No data available       Home Medications    Prior to Admission medications   Medication Sig Start Date End Date Taking? Authorizing Provider  acetaminophen (TYLENOL) 500 MG tablet Take 1,000 mg by mouth every 6 (six) hours as needed for mild pain or headache.   Yes [provider]  DULoxetine (CYMBALTA) 20 MG capsule Take 1 capsule (20 mg total) by mouth daily. 01/25/17  Yes Kathrynn Ducking, MD  HUMALOG MIX 75/25 KWIKPEN (75-25) 100 UNIT/ML Kwikpen Inject 15-60 Units into the skin 2 (two) times daily. Inject 60 units in the am and Inject 15 in the  pm. 06/04/15  Yes [provider]  ibuprofen (ADVIL,MOTRIN) 400 MG tablet Take 1 tablet (400 mg total) by mouth every 8 (eight) hours as needed for moderate pain (back pain.). 05/16/17  Yes Arrien, Jimmy Picket, MD  losartan (COZAAR) 50 MG tablet Take 50 mg by mouth daily. 04/28/17  Yes [provider]  midodrine (PROAMATINE) 5 MG tablet Take 5 mg by mouth 2 (two) times daily. 05/03/17  Yes [provider]  oxyCODONE-acetaminophen (PERCOCET/ROXICET) 5-325 MG tablet Take 1 tablet by mouth every 6 (six) hours as needed for moderate pain or severe pain. 05/16/17  Yes Arrien, Jimmy Picket, MD  propranolol (INDERAL) 10 MG tablet Take 20 mg by mouth 2 (two) times daily.  03/18/17  Yes [provider]  traMADol (ULTRAM) 50 MG tablet Take 50 mg by mouth 2 (two) times daily. 05/07/17  Yes [provider]  polyethylene glycol (MIRALAX / GLYCOLAX) packet Take 17 g by mouth daily. Patient not taking: Reported on 05/16/2017 05/17/17   Arrien, Jimmy Picket, MD    Family History Family History  Problem Relation Age of Onset  . Cancer Brother        prostate  . Heart disease Brother   . Alzheimer's disease Brother   . Stroke Mother   . Hypertension Mother   . COPD Brother   . Cancer Brother        Prostate cancer  . Hypertension Sister        Congestive heart failure  . Heart attack Son     Social History Social History  Substance Use Topics  . Smoking status: Never Smoker  . Smokeless tobacco: Never Used  . Alcohol use No     Allergies   Ciprofloxacin; Codeine; Dilaudid [hydromorphone hcl]; Mysoline [primidone]; and Oxycodone   Review of Systems Review of Systems All other systems negative except as documented in the HPI. All pertinent positives and negatives as reviewed in the HPI. Due to Physical Exam Updated Vital Signs BP (!) 185/89 (BP Location: Left Arm)   Pulse 90   Temp 98.1 F (36.7 C) (Oral)   Resp 18   SpO2 95%   Physical Exam   Constitutional: She is oriented to person, place, and time. She appears well-developed and well-nourished. No distress.  HENT:  Head: Normocephalic and atraumatic.  Eyes: Pupils are equal, round, and reactive to light.  Pulmonary/Chest: Effort normal.  Neurological: She is alert and oriented to person, place, and time.  Skin: Skin is warm and dry.  Psychiatric: She has a normal mood and affect.  Nursing note and vitals reviewed.    ED Treatments / Results  Labs (all labs ordered are listed, but only abnormal results are displayed) Labs Reviewed - No data to display  EKG  EKG Interpretation  None       Radiology No results found.  Procedures Procedures (including critical care time)  Medications Ordered in ED Medications - No data to display   Initial Impression / Assessment and Plan / ED Course  I have reviewed the triage vital signs and the nursing notes.  Pertinent labs & imaging results that were available during my care of the patient were reviewed by me and considered in my medical decision making (see chart for details).    Patient and family refused to go back to the nursing facility.  They state that they are unable to care for her.  Patient does not meet inpatient criteria at this point  Final Clinical Impressions(s) / ED Diagnoses   Final diagnoses:  None    New Prescriptions New Prescriptions   No medications on file     Dalia Heading, PA-C 05/17/17 0010    Margette Fast, MD 05/17/17 1101

## 2017-05-18 ENCOUNTER — Observation Stay (HOSPITAL_COMMUNITY): Payer: Medicare Other

## 2017-05-18 DIAGNOSIS — E119 Type 2 diabetes mellitus without complications: Secondary | ICD-10-CM | POA: Diagnosis not present

## 2017-05-18 DIAGNOSIS — Z794 Long term (current) use of insulin: Secondary | ICD-10-CM | POA: Diagnosis not present

## 2017-05-18 DIAGNOSIS — S32010S Wedge compression fracture of first lumbar vertebra, sequela: Secondary | ICD-10-CM

## 2017-05-18 DIAGNOSIS — M549 Dorsalgia, unspecified: Secondary | ICD-10-CM | POA: Diagnosis not present

## 2017-05-18 DIAGNOSIS — I10 Essential (primary) hypertension: Secondary | ICD-10-CM | POA: Diagnosis not present

## 2017-05-18 HISTORY — PX: IR KYPHO LUMBAR INC FX REDUCE BONE BX UNI/BIL CANNULATION INC/IMAGING: IMG5519

## 2017-05-18 LAB — URINALYSIS, ROUTINE W REFLEX MICROSCOPIC
Bilirubin Urine: NEGATIVE
Glucose, UA: 150 mg/dL — AB
Hgb urine dipstick: NEGATIVE
Ketones, ur: NEGATIVE mg/dL
Leukocytes, UA: NEGATIVE
Nitrite: NEGATIVE
Protein, ur: NEGATIVE mg/dL
Specific Gravity, Urine: 1.013 (ref 1.005–1.030)
pH: 6 (ref 5.0–8.0)

## 2017-05-18 LAB — BASIC METABOLIC PANEL
Anion gap: 8 (ref 5–15)
BUN: 18 mg/dL (ref 6–20)
CO2: 23 mmol/L (ref 22–32)
Calcium: 8.7 mg/dL — ABNORMAL LOW (ref 8.9–10.3)
Chloride: 108 mmol/L (ref 101–111)
Creatinine, Ser: 0.92 mg/dL (ref 0.44–1.00)
GFR calc Af Amer: >60 mL/min (ref >60)
GFR calc non Af Amer: 56 mL/min — ABNORMAL LOW (ref >60)
Glucose, Bld: 191 mg/dL — ABNORMAL HIGH (ref 65–99)
Potassium: 3.6 mmol/L (ref 3.5–5.1)
Sodium: 139 mmol/L (ref 135–145)

## 2017-05-18 LAB — GLUCOSE, CAPILLARY
Glucose-Capillary: 124 mg/dL — ABNORMAL HIGH (ref 65–99)
Glucose-Capillary: 134 mg/dL — ABNORMAL HIGH (ref 65–99)
Glucose-Capillary: 176 mg/dL — ABNORMAL HIGH (ref 65–99)
Glucose-Capillary: 177 mg/dL — ABNORMAL HIGH (ref 65–99)

## 2017-05-18 MED ORDER — FENTANYL CITRATE (PF) 100 MCG/2ML IJ SOLN
INTRAMUSCULAR | Status: AC | PRN
  Administered 2017-05-18 (×2): 25 ug via INTRAVENOUS

## 2017-05-18 MED ORDER — MIDAZOLAM HCL 2 MG/2ML IJ SOLN
INTRAMUSCULAR | Status: AC | PRN
  Administered 2017-05-18: 1 mg via INTRAVENOUS
  Administered 2017-05-18: 0.5 mg via INTRAVENOUS

## 2017-05-18 MED ORDER — PROPRANOLOL HCL 20 MG PO TABS
20.0000 mg | ORAL_TABLET | Freq: Two times a day (BID) | ORAL | Status: DC
  Administered 2017-05-18 – 2017-05-19 (×2): 20 mg via ORAL
  Filled 2017-05-18 (×4): qty 1

## 2017-05-18 MED ORDER — FENTANYL CITRATE (PF) 100 MCG/2ML IJ SOLN
INTRAMUSCULAR | Status: AC
  Filled 2017-05-18: qty 4

## 2017-05-18 MED ORDER — INSULIN ASPART PROT & ASPART (70-30 MIX) 100 UNIT/ML ~~LOC~~ SUSP
15.0000 [IU] | Freq: Every day | SUBCUTANEOUS | Status: DC
  Filled 2017-05-18: qty 10

## 2017-05-18 MED ORDER — CEFAZOLIN SODIUM-DEXTROSE 2-4 GM/100ML-% IV SOLN
INTRAVENOUS | Status: AC
  Filled 2017-05-18: qty 100

## 2017-05-18 MED ORDER — DULOXETINE HCL 20 MG PO CPEP
20.0000 mg | ORAL_CAPSULE | Freq: Every day | ORAL | Status: DC
  Administered 2017-05-18 – 2017-05-19 (×2): 20 mg via ORAL
  Filled 2017-05-18 (×2): qty 1

## 2017-05-18 MED ORDER — LOSARTAN POTASSIUM 50 MG PO TABS
50.0000 mg | ORAL_TABLET | Freq: Every day | ORAL | Status: DC
  Administered 2017-05-18 – 2017-05-19 (×2): 50 mg via ORAL
  Filled 2017-05-18 (×2): qty 1

## 2017-05-18 MED ORDER — TOBRAMYCIN SULFATE 1.2 G IJ SOLR
INTRAMUSCULAR | Status: AC
  Filled 2017-05-18: qty 1.2

## 2017-05-18 MED ORDER — BUPIVACAINE HCL (PF) 0.5 % IJ SOLN
INTRAMUSCULAR | Status: AC
  Filled 2017-05-18: qty 30

## 2017-05-18 MED ORDER — MIDAZOLAM HCL 2 MG/2ML IJ SOLN
INTRAMUSCULAR | Status: AC
  Filled 2017-05-18: qty 4

## 2017-05-18 MED ORDER — INSULIN LISPRO PROT & LISPRO (75-25 MIX) 100 UNIT/ML KWIKPEN: 15.0000 [IU] | PEN_INJECTOR | Freq: Two times a day (BID) | SUBCUTANEOUS | Status: DC

## 2017-05-18 MED ORDER — IOPAMIDOL (ISOVUE-300) INJECTION 61%
INTRAVENOUS | Status: AC
  Administered 2017-05-18: 5 mL
  Filled 2017-05-18: qty 50

## 2017-05-18 MED ORDER — INSULIN ASPART PROT & ASPART (70-30 MIX) 100 UNIT/ML ~~LOC~~ SUSP
60.0000 [IU] | Freq: Every day | SUBCUTANEOUS | Status: DC
  Administered 2017-05-19: 60 [IU] via SUBCUTANEOUS
  Filled 2017-05-18 (×2): qty 10

## 2017-05-18 MED ORDER — BUPIVACAINE HCL (PF) 0.5 % IJ SOLN
INTRAMUSCULAR | Status: AC | PRN
  Administered 2017-05-18: 15 mL

## 2017-05-18 NOTE — Sedation Documentation (Signed)
Back on bed, tolerated well, no c/o pain at this time

## 2017-05-18 NOTE — Progress Notes (Addendum)
PROGRESS NOTE [facility]   [name]   WUJ:[id number] DOB: October 03, 1932  DOA: 05/16/2017 PCP: Aretta Nip, MD   Brief Narrative:  [name] stage 83 year old female with medical history of DJD, hypertension, diabetes mellitus type 2 and chronic anemia who presented to the emergency department complaining of intractable back pain. Patient was previously discharged on 9/9 after found and acute L1 compression fracture and decided not to proceed with any surgical intervention. She was sent to nursing facility but was unable to ambulate and having difficult to control pain, family asked to bring back patient to the ER.In the ED IR was consulted and kyphoplasty was offered for which family accepted procedure. Patient now s/p Kyphoplasty.   Subjective: Patient seen and examined, pain better controlled. No complaints at this time. BP elevated   Assessment & Plan: Intractable back pain secondary to L1 compression fracture Status post kyphoplasty Social worker consulted for SNF placement Continue pain control  Continue calcitonin for now  PT as tolerated  Diabetes mellitus type 2 On insulin at home Will resume home dose as patient is now on diet Continue SSI A1c on May 2018 8.2 Monitor CBG  Essential hypertension Blood pressure above goal Likely due to combination of pain and holding BP medications Will resume losartan and propanolol Hydralazine PRN  Holding midodrine for now   Chronic diastolic CHF  Seems to be compensated at this time. Continue to monitor   DVT prophylaxis: SCD's  Code Status: Full code  Family Communication: Daughter at bedside  Disposition Plan: SNF in next 24-48 hrs   Consultants:   IR  Procedures:   Kyphoplasty   Antimicrobials: Anti-infectives    Start     Dose/Rate Route Frequency Ordered Stop   05/18/17 1503  ceFAZolin (ANCEF) 2-4 GM/100ML-% IVPB    Comments:  Shaaron Adler   : cabinet override      05/18/17 1503  05/19/17 0314   05/18/17 1402  tobramycin (NEBCIN) 1.2 g powder    Comments:  Hanner, James   : cabinet override      05/18/17 1402 05/19/17 0214   05/18/17 0600  ceFAZolin (ANCEF) IVPB 2g/100 mL premix     2 g 200 mL/hr over 30 Minutes Intravenous To Radiology 05/17/17 1409 05/18/17 1540   05/17/17 1315  ceFAZolin (ANCEF) IVPB 2g/100 mL premix  Status:  Discontinued     2 g 200 mL/hr over 30 Minutes Intravenous To Radiology 05/17/17 1311 05/17/17 1409       Objective: Vitals:   05/18/17 1549 05/18/17 1557 05/18/17 1602 05/18/17 1643  BP: (!) 203/99 (!) 190/80 (!) 181/76 (!) 167/69  Pulse: 82 78 78 68  Resp: 14 16 16 16   Temp:    98 F (36.7 C)  TempSrc:    Oral  SpO2: 95% 94% 95% 96%  Weight:      Height:        Intake/Output Summary (Last 24 hours) at 05/18/17 1646 Last data filed at 05/18/17 1510  Gross per 24 hour  Intake          1851.25 ml  Output              100 ml  Net          1751.25 ml   Filed Weights   05/17/17 1338  Weight: 100.3 kg (221 lb 3.2 oz)    Examination:  General exam: NAD  HEENT: OP moist and clear Respiratory system: Clear to auscultation. No wheezes,crackle  or rhonchi Cardiovascular system: S1 & S2 heard, RRR. No JVD, murmurs, rubs or gallops Gastrointestinal system: Abdomen is nondistended, soft and nontender. Normal bowel sounds heard. Central nervous system: Alert and oriented. No focal neurological deficits. Extremities: No pedal edema. strength 4/5 in lower extremities    Skin: No rashes, lesions or ulcers Psychiatry: Judgement and insight appear normal. Mood & affect appropriate.   Data Reviewed: I have personally reviewed following labs and imaging studies  CBC:  Recent Labs Lab 05/14/17 1646 05/15/17 0529 05/17/17 1227  WBC 11.2* 9.6 7.4  NEUTROABS 7.2  --  4.9  HGB 14.3 12.0 12.9  HCT 42.1 37.3 39.3  MCV 82.4 83.6 83.3  PLT 248 211 419   Basic Metabolic Panel:  Recent Labs Lab 05/14/17 1646 05/17/17 1227  05/18/17 0619  NA 140 137 139  K 3.8 5.4* 3.6  CL 102 103 108  CO2 29 26 23   GLUCOSE 45* 197* 191*  BUN 22* 18 18  CREATININE 0.85 0.90 0.92  CALCIUM 9.8 9.0 8.7*   GFR: Estimated Creatinine Clearance: 58.4 mL/min (by C-G formula based on SCr of 0.92 mg/dL). Liver Function Tests:  Recent Labs Lab 05/14/17 1646  AST 18  ALT 12*  ALKPHOS 89  BILITOT 0.7  PROT 7.9  ALBUMIN 4.1    Recent Labs Lab 05/14/17 1646  LIPASE 22   No results for input(s): AMMONIA in the last 168 hours. Coagulation Profile:  Recent Labs Lab 05/17/17 1135  INR 1.06   Cardiac Enzymes: No results for input(s): CKTOTAL, CKMB, CKMBINDEX, TROPONINI in the last 168 hours. BNP (last 3 results) No results for input(s): PROBNP in the last 8760 hours. HbA1C: No results for input(s): HGBA1C in the last 72 hours. CBG:  Recent Labs Lab 05/17/17 1305 05/17/17 1652 05/17/17 2100 05/18/17 0802 05/18/17 1233  GLUCAP 171* 173* 197* 177* 134*   Lipid Profile: No results for input(s): CHOL, HDL, LDLCALC, TRIG, CHOLHDL, LDLDIRECT in the last 72 hours. Thyroid Function Tests: No results for input(s): TSH, T4TOTAL, FREET4, T3FREE, THYROIDAB in the last 72 hours. Anemia Panel: No results for input(s): VITAMINB12, FOLATE, FERRITIN, TIBC, IRON, RETICCTPCT in the last 72 hours. Sepsis Labs: No results for input(s): PROCALCITON, LATICACIDVEN in the last 168 hours.  Recent Results (from the past 240 hour(s))  MRSA PCR Screening     Status: None   Collection Time: 05/17/17  5:31 PM  Result Value Ref Range Status   MRSA by PCR NEGATIVE NEGATIVE Final    Comment:        The GeneXpert MRSA Assay (FDA approved for NASAL specimens only), is one component of a comprehensive MRSA colonization surveillance program. It is not intended to diagnose MRSA infection nor to guide or monitor treatment for MRSA infections.      Radiology Studies: No results found.   Scheduled Meds: . bupivacaine      .  calcitonin (salmon)  1 spray Alternating Nares Daily  . DULoxetine  20 mg Oral Daily  . feeding supplement (ENSURE ENLIVE)  237 mL Oral BID BM  . fentaNYL      . insulin aspart  0-5 Units Subcutaneous QHS  . insulin aspart  0-9 Units Subcutaneous TID WC  . ketorolac  15 mg Intravenous Q8H  . losartan  50 mg Oral Daily  . metoprolol tartrate  5 mg Intravenous Q6H  . midazolam      . pantoprazole (PROTONIX) IV  40 mg Intravenous Q12H  . sodium chloride flush  3  mL Intravenous Q12H  . tobramycin       Continuous Infusions: . ceFAZolin       LOS: 0 days    Time spent: Total of 25 minutes spent with pt, greater than 50% of which was spent in discussion of  treatment, counseling and coordination of care    Chipper Oman, MD Pager: Text Page via [email or web address]   If 7PM-7AM, please contact night-coverage [email or web address] 05/18/2017, 4:46 PM

## 2017-05-18 NOTE — Progress Notes (Addendum)
Initial Nutrition Assessment  DOCUMENTATION CODES:   Obesity unspecified  INTERVENTION:   -RD will follow for diet advancement and supplement as appropriate  NUTRITION DIAGNOSIS:   Inadequate oral intake related to inability to eat as evidenced by NPO status.  GOAL:   Patient will meet greater than or equal to 90% of their needs  MONITOR:   Diet advancement, PO intake, Labs, Skin, I & O's  REASON FOR ASSESSMENT:   Malnutrition Screening Tool    ASSESSMENT:   [name] is a 83 y.o. female with medical history significant for hypertension, left ventricular*dysfunction with preserved systolic function, diabetes on insulin, dyslipidemia, GERD, anemia, and osteoarthritis. Patient was discharged on 9/9 after a brief admission for intractable back pain secondary to acute L1 compression fracture diagnosed on MRI. Patient arrived to the nursing facility but was unable to ambulate and was having difficult to control pain. Family had patient brought back to ER. IR was consulted and plans are to pursue kyphoplasty during this admission. Family also dissatisfied with the care given at previous SNF so SW has been consulted to assist in finding a new facility.  Pt admitted with intractable back pain secondary to closed L1 compression fracture.   Pt down in interventional radiology at time of visit; currently undergoing kyphoplasty. No family available to provide additional hx.   Per previously on a carb modified diet; meal completion 20-25%.   Reviewed wt hx. Pt has experienced a 7.9% wt loss over the past year, which is not significant for time frame.  Unable to complete Nutrition-Focused physical exam at this time.   Labs reviewed: CBGS: 134-177. Last Hgb A1c: 8.2 (01/2017). Current orders for glycemic control are 0-5 units insulin aspart daily at bedtime and 0-9 units insulin aspart TID with meals. Outpatient DM medications are Humalox mix 75/25- 60 units in the AM and 15 units in  the PM.  Diet Order:  Diet heart healthy/carb modified Room service appropriate? Yes; Fluid consistency: Thin  Skin:  Reviewed, no issues  Last BM:  05/15/17  Height:   Ht Readings from Last 1 Encounters:  05/17/17 5\' 8"  (1.727 m)    Weight:   Wt Readings from Last 1 Encounters:  05/17/17 221 lb 3.2 oz (100.3 kg)    Ideal Body Weight:  63.6 kg  BMI:  Body mass index is 33.63 kg/m.  Estimated Nutritional Needs:   Kcal:  1600-1800  Protein:  80-95 grams  Fluid:  1.6-1.8 L  EDUCATION NEEDS:   Education needs no appropriate at this time  Eureka Valdes A. Jimmye Norman, RD, LDN, CDE Pager: [phone] After hours Pager: [phone]

## 2017-05-18 NOTE — Sedation Documentation (Signed)
Dr Estanislado Pandy at bedside.  Explaining procedure and findings.

## 2017-05-18 NOTE — Sedation Documentation (Signed)
Moved to IR 2, assisted ot IR table.

## 2017-05-18 NOTE — Sedation Documentation (Signed)
Dr Estanislado Pandy in, procedure reviewed, questions answered

## 2017-05-18 NOTE — Sedation Documentation (Signed)
Dr Estanislado Pandy awareiof BP

## 2017-05-18 NOTE — Sedation Documentation (Signed)
Patient is resting comfortably.  Tolerating well 

## 2017-05-19 ENCOUNTER — Encounter (HOSPITAL_COMMUNITY): Payer: Self-pay | Admitting: Interventional Radiology

## 2017-05-19 DIAGNOSIS — M549 Dorsalgia, unspecified: Secondary | ICD-10-CM

## 2017-05-19 DIAGNOSIS — S32010D Wedge compression fracture of first lumbar vertebra, subsequent encounter for fracture with routine healing: Secondary | ICD-10-CM

## 2017-05-19 DIAGNOSIS — S32010A Wedge compression fracture of first lumbar vertebra, initial encounter for closed fracture: Secondary | ICD-10-CM | POA: Diagnosis not present

## 2017-05-19 LAB — BASIC METABOLIC PANEL
Anion gap: 5 (ref 5–15)
BUN: 13 mg/dL (ref 6–20)
CO2: 26 mmol/L (ref 22–32)
Calcium: 9 mg/dL (ref 8.9–10.3)
Chloride: 108 mmol/L (ref 101–111)
Creatinine, Ser: 0.85 mg/dL (ref 0.44–1.00)
GFR calc Af Amer: >60 mL/min (ref >60)
GFR calc non Af Amer: >60 mL/min (ref >60)
Glucose, Bld: 164 mg/dL — ABNORMAL HIGH (ref 65–99)
Potassium: 3.7 mmol/L (ref 3.5–5.1)
Sodium: 139 mmol/L (ref 135–145)

## 2017-05-19 LAB — CBC
HCT: 38.2 % (ref 36.0–46.0)
Hemoglobin: 12.3 g/dL (ref 12.0–15.0)
MCH: 26.9 pg (ref 26.0–34.0)
MCHC: 32.2 g/dL (ref 30.0–36.0)
MCV: 83.6 fL (ref 78.0–100.0)
Platelets: 202 10*3/uL (ref 150–400)
RBC: 4.57 MIL/uL (ref 3.87–5.11)
RDW: 14.2 % (ref 11.5–15.5)
WBC: 7.5 10*3/uL (ref 4.0–10.5)

## 2017-05-19 LAB — GLUCOSE, CAPILLARY
Glucose-Capillary: 158 mg/dL — ABNORMAL HIGH (ref 65–99)
Glucose-Capillary: 112 mg/dL — ABNORMAL HIGH (ref 65–99)
Glucose-Capillary: 81 mg/dL (ref 65–99)

## 2017-05-19 MED ORDER — TRAMADOL HCL 50 MG PO TABS: 50.0000 mg | ORAL_TABLET | Freq: Two times a day (BID) | ORAL | 0 refills | Status: DC

## 2017-05-19 MED ORDER — ACETAMINOPHEN 325 MG PO TABS: 650.0000 mg | ORAL_TABLET | Freq: Four times a day (QID) | ORAL | Status: DC | PRN

## 2017-05-19 MED ORDER — CALCITONIN (SALMON) 200 UNIT/ACT NA SOLN: 1.0000 | Freq: Every day | NASAL | 12 refills | Status: DC

## 2017-05-19 NOTE — H&P (Signed)
Report called to [facility]

## 2017-05-19 NOTE — Progress Notes (Signed)
[name] to be D/C'd Skilled nursing facility per MD order.  Discussed with the patient and all questions fully answered.  VSS, Skin clean, dry and intact without evidence of skin break down, no evidence of skin tears noted. IV catheter discontinued intact. Site without signs and symptoms of complications. Dressing and pressure applied.  An After Visit Summary was printed and given to the patient. Patient received prescription.  D/c education completed with patient/family including follow up instructions, medication list, d/c activities limitations if indicated, with other d/c instructions as indicated by MD - patient able to verbalize understanding, all questions fully answered.   Patient instructed to return to ED, call 911, or call MD for any changes in condition.   Patient escorted via [place], and D/C [facility] via private auto.  [name] 05/19/2017 7:06 PM

## 2017-05-19 NOTE — Discharge Summary (Signed)
Physician Discharge Summary  [name]:[id number] DOB: 03-28-1933 DOA: 05/16/2017  PCP: Aretta Nip, MD  Admit date: 05/16/2017 Discharge date: 05/19/2017  Admitted From: snf Disposition: SNF  Recommendations for Outpatient Follow-up:  1. Follow up with PCP in 1-2 weeks 2. Please obtain BMP/CBC in one week Please follow up with Dr Estanislado Pandy as needed.   Discharge Condition: stable.  CODE STATUS: full code.  Diet recommendation: Heart Healthy  Brief/Interim Summary: [name] stage 83 year old female with medical history of DJD, hypertension, diabetes mellitus type 2 and chronic anemia who presented to the emergency department complaining of intractable back pain. Patient was previously discharged on 9/9 after found and acute L1 compression fracture and decided not to proceed with any surgical intervention. She was sent to nursing facility but was unable to ambulate and having difficult to control pain, family asked to bring back patient to the ER.In the ED IR was consulted and kyphoplasty was offered for which family accepted procedure. Patient now s/p Kyphoplasty.    Discharge Diagnoses:  Principal Problem:   Intractable back pain Active Problems:   Left ventricular diastolic dysfunction with preserved systolic function   Closed compression fracture of L1 lumbar vertebra (HCC)   Diabetes mellitus, insulin dependent (IDDM), controlled (HCC)   GERD (gastroesophageal reflux disease)   Hypertension   Hyperlipemia  Intractable back pain secondary to L1 compression fracture Status post kyphoplasty Social worker consulted for SNF placement Continue pain control  Continue calcitonin for now  PT as tolerated  Diabetes mellitus type 2 Resume home regimen on discharge.    Essential hypertension Well controlled.   Chronic diastolic CHF  Compensated.   Discharge Instructions  Discharge Instructions    Diet - low sodium heart healthy    Complete by:  As  directed    Discharge instructions    Complete by:  As directed    Please follow up with PCP as recommended in one week.     Allergies as of 05/19/2017      Reactions   Ciprofloxacin Other (See Comments)   Acute kidney failure   Codeine Nausea And Vomiting   Dilaudid [hydromorphone Hcl] Nausea And Vomiting, Other (See Comments)   Severe sweating   Mysoline [primidone] Nausea Only   Oxycodone Nausea And Vomiting      Medication List    STOP taking these medications   ibuprofen 400 MG tablet Commonly known as:  ADVIL,MOTRIN   oxyCODONE-acetaminophen 5-325 MG tablet Commonly known as:  PERCOCET/ROXICET   polyethylene glycol packet Commonly known as:  MIRALAX / GLYCOLAX     TAKE these medications   acetaminophen 325 MG tablet Commonly known as:  TYLENOL Take 2 tablets (650 mg total) by mouth every 6 (six) hours as needed for mild pain (or Fever >/= 101). What changed:  medication strength  how much to take  reasons to take this   calcitonin (salmon) 200 UNIT/ACT nasal spray Commonly known as:  MIACALCIN/FORTICAL Place 1 spray into alternate nostrils daily.   DULoxetine 20 MG capsule Commonly known as:  CYMBALTA Take 1 capsule (20 mg total) by mouth daily.   HUMALOG MIX 75/25 KWIKPEN (75-25) 100 UNIT/ML Kwikpen Generic drug:  Insulin Lispro Prot & Lispro Inject 15-60 Units into the skin 2 (two) times daily. Inject 60 units in the am and Inject 15 in the pm.   losartan 50 MG tablet Commonly known as:  COZAAR Take 50 mg by mouth daily.   midodrine 5 MG tablet Commonly  known as:  PROAMATINE Take 5 mg by mouth 2 (two) times daily.   propranolol 10 MG tablet Commonly known as:  INDERAL Take 20 mg by mouth 2 (two) times daily.   traMADol 50 MG tablet Commonly known as:  ULTRAM Take 1 tablet (50 mg total) by mouth 2 (two) times daily.            Discharge Care Instructions        Start     Ordered   05/20/17 0000  calcitonin, salmon,  (MIACALCIN/FORTICAL) 200 UNIT/ACT nasal spray  Daily     05/19/17 1112   05/19/17 0000  acetaminophen (TYLENOL) 325 MG tablet  Every 6 hours PRN     05/19/17 1112   05/19/17 0000  traMADol (ULTRAM) 50 MG tablet  2 times daily     05/19/17 1112   05/19/17 0000  Diet - low sodium heart healthy     05/19/17 1112   05/19/17 0000  Discharge instructions    Comments:  Please follow up with PCP as recommended in one week.   05/19/17 1112     Follow-up Information    Rankins, Bill Salinas, MD. Schedule an appointment as soon as possible for a visit in 1 week(s).   Specialty:  Family Medicine Contact information: [place] [address] [phone]          Allergies  Allergen Reactions  . Ciprofloxacin Other (See Comments)    Acute kidney failure  . Codeine Nausea And Vomiting  . Dilaudid [Hydromorphone Hcl] Nausea And Vomiting and Other (See Comments)    Severe sweating  . Mysoline [Primidone] Nausea Only  . Oxycodone Nausea And Vomiting    Consultations:  Radiology.   Procedures/Studies: Dg Chest 2 View  Result Date: 05/14/2017 CLINICAL DATA:  Chest pain. Occasional left shoulder pain. Fall 8 days ago. EXAM: CHEST  2 VIEW COMPARISON:  01/04/2017 FINDINGS: The cardiomediastinal silhouette is within normal limits. Slight elevation of the right hemidiaphragm anteriorly is unchanged. No airspace consolidation, edema, pleural effusion, pneumothorax is identified. No acute osseous abnormality is identified. Prior cervical spine fusion is noted. IMPRESSION: No active cardiopulmonary disease. Electronically Signed   By: Logan Bores M.D.   On: 05/14/2017 18:28   Dg Lumbar Spine Complete  Result Date: 05/06/2017 CLINICAL DATA:  Fall. EXAM: LUMBAR SPINE - COMPLETE 4+ VIEW COMPARISON:  MRI 11/08/2016 .  Lumbar spine series 220 2018. FINDINGS: Lumbar spine scoliosis concave left. No acute bony abnormality identified. Postsurgical changes lower lumbar spine. Minimal  thoracolumbar spine compressions are again noted. These are unchanged. No evidence of acute fracture. Symptoms persist MRI can be obtained. Rounded calcification noted in the right upper quadrant is unchanged and most likely a gallstone. IMPRESSION: 1. Rounded calcification in the right upper quadrant unchanged most likely a gallstone. 2. Diffuse multilevel degenerative change. Postsurgical changes lower lumbar spine . Stable minimal thoracolumbar spine compressions fractures. No change. No acute bony abnormality identified. Exam is stable from prior exam. Electronically Signed   By: Marcello Moores  Register   On: 05/06/2017 17:05   Mr Lumbar Spine Wo Contrast  Result Date: 05/14/2017 CLINICAL DATA:  Back pain.  Suspect cauda equina syndrome. EXAM: MRI LUMBAR SPINE WITHOUT CONTRAST TECHNIQUE: Multiplanar, multisequence MR imaging of the lumbar spine was performed. No intravenous contrast was administered. COMPARISON:  Lumbar spine radiographs May 06, 2017 and MRI of the lumbar spine November 08, 2016 FINDINGS: SEGMENTATION: For the purposes of this report, the last well-formed intervertebral disc  will be reported as L5-S1. ALIGNMENT: Maintained lumbar lordosis. No malalignment. VERTEBRAE:Acute mild L1 compression fracture with less than 25% superior endplate height loss, low T1 and bright STIR signal. No retropulsed bony fragments. Stable moderate to severe L4-5 disc height loss with disc desiccation all levels. Moderate chronic discogenic endplate changes D7-8. CONUS MEDULLARIS: Conus medullaris terminates at T12-L1 and demonstrates normal morphology and signal characteristics. Cauda equina is normal. PARASPINAL AND SOFT TISSUES: Moderate to severe paraspinal muscle atrophy, most consistent with denervation. DISC LEVELS: T12-L1, L1-2, L2-3: No disc bulge, canal stenosis nor neural foraminal narrowing. Mild to moderate facet arthropathy. L3-4: Status post laminectomies. Moderate facet arthropathy without canal stenosis.  Mild bilateral neural foraminal narrowing. L4-5: RIGHT facetectomy and laminectomy. Granulation tissue RIGHT lateral recess medially displacing the traversing L5 nerve. No canal stenosis. Moderate to severe RIGHT neural foraminal narrowing. L5-S1: RIGHT hemilaminectomy. No disc bulge. Moderate RIGHT, mild LEFT facet arthropathy without canal stenosis. Moderate RIGHT neural foraminal narrowing. IMPRESSION: 1. Acute mild L1 compression fracture, probable insufficiency fracture. 2. Stable postoperative change of the lower lumbar spine. 3. No canal stenosis. Granulation tissue L4-5 lateral recess may affect the traversing RIGHT L5 nerve, unchanged. 4. Neural foraminal narrowing L3-4 through L5-S1: Moderate to severe on the RIGHT at L4-5. Electronically Signed   By: Elon Alas M.D.   On: 05/14/2017 22:03   Dg Hips Bilat W Or Wo Pelvis 5 Views  Result Date: 05/06/2017 CLINICAL DATA:  83 year old female status post fall with bilateral hip pain. EXAM: DG HIP (WITH OR WITHOUT PELVIS) 5+V BILAT COMPARISON:  None. FINDINGS: There is no evidence of hip fracture or dislocation. There is no evidence of arthropathy or other focal bone abnormality. Soft tissues and visceral contours unremarkable. IMPRESSION: No evidence for acute hip fracture or dislocation. Electronically Signed   By: Kristopher Oppenheim M.D.   On: 05/06/2017 15:23       Subjective:   Discharge Exam: Vitals:   05/19/17 0349 05/19/17 0904  BP: (!) 146/70 (!) 167/78  Pulse: 71   Resp: 15   Temp: 98.1 F (36.7 C)   SpO2: 100%    Vitals:   05/18/17 2301 05/19/17 0030 05/19/17 0349 05/19/17 0904  BP: (!) 190/74 (!) 152/57 (!) 146/70 (!) 167/78  Pulse: 84 79 71   Resp: 16  15   Temp: 97.9 F (36.6 C)  98.1 F (36.7 C)   TempSrc: Oral  Oral   SpO2: 98%  100%   Weight:      Height:        General: Pt is alert, awake, not in acute distress Cardiovascular: RRR, S1/S2 +, no rubs, no gallops Respiratory: CTA bilaterally, no wheezing,  no rhonchi Abdominal: Soft, NT, ND, bowel sounds + Extremities: no edema, no cyanosis    The results of significant diagnostics from this hospitalization (including imaging, microbiology, ancillary and laboratory) are listed below for reference.     Microbiology: Recent Results (from the past 240 hour(s))  MRSA PCR Screening     Status: None   Collection Time: 05/17/17  5:31 PM  Result Value Ref Range Status   MRSA by PCR NEGATIVE NEGATIVE Final    Comment:        The GeneXpert MRSA Assay (FDA approved for NASAL specimens only), is one component of a comprehensive MRSA colonization surveillance program. It is not intended to diagnose MRSA infection nor to guide or monitor treatment for MRSA infections.      Labs: BNP (last 3 results) No results  for input(s): BNP in the last 8760 hours. Basic Metabolic Panel:  Recent Labs Lab 05/14/17 1646 05/17/17 1227 05/18/17 0619 05/19/17 0650  NA 140 137 139 139  K 3.8 5.4* 3.6 3.7  CL 102 103 108 108  CO2 29 26 23 26   GLUCOSE 45* 197* 191* 164*  BUN 22* 18 18 13   CREATININE 0.85 0.90 0.92 0.85  CALCIUM 9.8 9.0 8.7* 9.0   Liver Function Tests:  Recent Labs Lab 05/14/17 1646  AST 18  ALT 12*  ALKPHOS 89  BILITOT 0.7  PROT 7.9  ALBUMIN 4.1    Recent Labs Lab 05/14/17 1646  LIPASE 22   No results for input(s): AMMONIA in the last 168 hours. CBC:  Recent Labs Lab 05/14/17 1646 05/15/17 0529 05/17/17 1227 05/19/17 0650  WBC 11.2* 9.6 7.4 7.5  NEUTROABS 7.2  --  4.9  --   HGB 14.3 12.0 12.9 12.3  HCT 42.1 37.3 39.3 38.2  MCV 82.4 83.6 83.3 83.6  PLT 248 211 218 202   Cardiac Enzymes: No results for input(s): CKTOTAL, CKMB, CKMBINDEX, TROPONINI in the last 168 hours. BNP: Invalid input(s): POCBNP CBG:  Recent Labs Lab 05/18/17 0802 05/18/17 1233 05/18/17 1700 05/18/17 2331 05/19/17 0807  GLUCAP 177* 134* 124* 176* 158*   D-Dimer No results for input(s): DDIMER in the last 72 hours. Hgb  A1c No results for input(s): HGBA1C in the last 72 hours. Lipid Profile No results for input(s): CHOL, HDL, LDLCALC, TRIG, CHOLHDL, LDLDIRECT in the last 72 hours. Thyroid function studies No results for input(s): TSH, T4TOTAL, T3FREE, THYROIDAB in the last 72 hours.  Invalid input(s): FREET3 Anemia work up No results for input(s): VITAMINB12, FOLATE, FERRITIN, TIBC, IRON, RETICCTPCT in the last 72 hours. Urinalysis    Component Value Date/Time   COLORURINE YELLOW 05/18/2017 0951   APPEARANCEUR CLEAR 05/18/2017 0951   LABSPEC 1.013 05/18/2017 0951   PHURINE 6.0 05/18/2017 0951   GLUCOSEU 150 (A) 05/18/2017 0951   HGBUR NEGATIVE 05/18/2017 0951   BILIRUBINUR NEGATIVE 05/18/2017 0951   KETONESUR NEGATIVE 05/18/2017 0951   PROTEINUR NEGATIVE 05/18/2017 0951   UROBILINOGEN 0.2 05/03/2014 0942   NITRITE NEGATIVE 05/18/2017 0951   LEUKOCYTESUR NEGATIVE 05/18/2017 0951   Sepsis Labs Invalid input(s): PROCALCITONIN,  WBC,  LACTICIDVEN Microbiology Recent Results (from the past 240 hour(s))  MRSA PCR Screening     Status: None   Collection Time: 05/17/17  5:31 PM  Result Value Ref Range Status   MRSA by PCR NEGATIVE NEGATIVE Final    Comment:        The GeneXpert MRSA Assay (FDA approved for NASAL specimens only), is one component of a comprehensive MRSA colonization surveillance program. It is not intended to diagnose MRSA infection nor to guide or monitor treatment for MRSA infections.      Time coordinating discharge: Over 30 minutes  SIGNED:   Hosie Poisson, MD  [facility] 05/19/2017, 11:13 AM Pager   If 7PM-7AM, please contact night-coverage [email or web address] Password [email or web address]

## 2017-05-19 NOTE — Progress Notes (Signed)
Referring Physician(s): Linna Darner  Supervising Physician: Luanne Bras  Patient Status:  [facility] - In-pt  Chief Complaint:  Back pain L1 compression fracture S/P kyphoplasty 05/18/2017 by Dr. Estanislado Pandy  Subjective:  [name] says her back is feeling better. She does tell me she "didn't sleep well in that bed".  She also reports she is going to [facility] facility upon discharge.  Allergies: Ciprofloxacin; Codeine; Dilaudid [hydromorphone hcl]; Mysoline [primidone]; and Oxycodone  Medications: Prior to Admission medications   Medication Sig Start Date End Date Taking? Authorizing Provider  acetaminophen (TYLENOL) 500 MG tablet Take 1,000 mg by mouth every 6 (six) hours as needed for mild pain or headache.   Yes [provider]  DULoxetine (CYMBALTA) 20 MG capsule Take 1 capsule (20 mg total) by mouth daily. 01/25/17  Yes Kathrynn Ducking, MD  HUMALOG MIX 75/25 KWIKPEN (75-25) 100 UNIT/ML Kwikpen Inject 15-60 Units into the skin 2 (two) times daily. Inject 60 units in the am and Inject 15 in the pm. 06/04/15  Yes [provider]  ibuprofen (ADVIL,MOTRIN) 400 MG tablet Take 1 tablet (400 mg total) by mouth every 8 (eight) hours as needed for moderate pain (back pain.). 05/16/17  Yes Arrien, Jimmy Picket, MD  losartan (COZAAR) 50 MG tablet Take 50 mg by mouth daily. 04/28/17  Yes [provider]  midodrine (PROAMATINE) 5 MG tablet Take 5 mg by mouth 2 (two) times daily. 05/03/17  Yes [provider]  oxyCODONE-acetaminophen (PERCOCET/ROXICET) 5-325 MG tablet Take 1 tablet by mouth every 6 (six) hours as needed for moderate pain or severe pain. 05/16/17  Yes Arrien, Jimmy Picket, MD  propranolol (INDERAL) 10 MG tablet Take 20 mg by mouth 2 (two) times daily.  03/18/17  Yes [provider]  acetaminophen (TYLENOL) 325 MG tablet Take 2 tablets (650 mg total) by mouth every 6 (six) hours as needed for mild pain (or Fever >/= 101).  05/19/17   Hosie Poisson, MD  calcitonin, salmon, (MIACALCIN/FORTICAL) 200 UNIT/ACT nasal spray Place 1 spray into alternate nostrils daily. 05/20/17   Hosie Poisson, MD  polyethylene glycol (MIRALAX / GLYCOLAX) packet Take 17 g by mouth daily. Patient not taking: Reported on 05/16/2017 05/17/17   Arrien, Jimmy Picket, MD  traMADol (ULTRAM) 50 MG tablet Take 1 tablet (50 mg total) by mouth 2 (two) times daily. 05/19/17   Hosie Poisson, MD     Vital Signs: BP (!) 167/78   Pulse 71   Temp 98.1 F (36.7 C) (Oral)   Resp 15   Ht 5\' 8"  (1.727 m)   Wt 221 lb 3.2 oz (100.3 kg)   SpO2 100%   BMI 33.63 kg/m   Physical Exam Awake and alert NAD Sitting up in chair Stick site looks good, no bleeding or bruising.  Imaging: No results found.  Labs:  CBC:  Recent Labs  05/14/17 1646 05/15/17 0529 05/17/17 1227 05/19/17 0650  WBC 11.2* 9.6 7.4 7.5  HGB 14.3 12.0 12.9 12.3  HCT 42.1 37.3 39.3 38.2  PLT 248 211 218 202    COAGS:  Recent Labs  05/17/17 1135 05/17/17 1227  INR 1.06  --   APTT  --  33    BMP:  Recent Labs  05/14/17 1646 05/17/17 1227 05/18/17 0619 05/19/17 0650  NA 140 137 139 139  K 3.8 5.4* 3.6 3.7  CL 102 103 108 108  CO2 29 26 23 26   GLUCOSE 45* 197* 191* 164*  BUN 22* 18  18 13  CALCIUM 9.8 9.0 8.7* 9.0  CREATININE 0.85 0.90 0.92 0.85  GFRNONAA >60 58* 56* >60  GFRAA >60 >60 >60 >60    LIVER FUNCTION TESTS:  Recent Labs  01/04/17 1353 01/06/17 0412 05/14/17 1646  BILITOT 0.7 0.4 0.7  AST 31 24 18   ALT 22 18 12*  ALKPHOS 56 51 89  PROT 7.0 5.8* 7.9  ALBUMIN 3.8 3.1* 4.1    Assessment and Plan:  L1 compression fracture = S/P Kyphoplasty by Dr. Estanislado Pandy 05/18/2017  Ambulate  OK to D/C to rehab facility.  She will f/u with Dr. Estanislado Pandy in about 2-4 weeks.  Electronically Signed: Murrell Redden, PA-C 05/19/2017, 1:53 PM   I spent a total of 15 Minutes at the the patient's bedside AND on the patient's hospital floor or  unit, greater than 50% of which was counseling/coordinating care for f/u kyphoplasty.

## 2017-05-19 NOTE — Care Management Note (Signed)
Case Management Note  Patient Details  Name: [name] MRN: [id number] Date of Birth: 10/13/1932  Subjective/Objective:            Presented with intractable back pain, hx of DJD, hypertension, diabetes mellitus type 2 and chronic anemia.       s/p kyphoplasty  9/11  Celene Skeen (Daughter) [place] ([phone]      PCP: Milagros Evener  Action/Plan: Plan is to d/c to SNF today. CSW managing  Disposition to facility.  Expected Discharge Date:   05/19/2017           Expected Discharge Plan:  [place]  In-House Referral:  Clinical Social Work  Discharge planning Services  CM Consult   Status of Service:  Completed, signed off  If discussed at [employer] of Stay Meetings, dates discussed:    Additional Comments:  Sharin Mons, RN 05/19/2017, 10:41 AM

## 2017-05-19 NOTE — Progress Notes (Signed)
pts family frustrated on waiting for PTAR most of the day, Pts family decided to take pt via private vehicle

## 2017-05-19 NOTE — Clinical Social Work Placement (Signed)
   CLINICAL SOCIAL WORK PLACEMENT  NOTE  Date:  05/19/2017  Patient Details  Name: [name] MRN: [id number] Date of Birth: 09-Oct-1932  Clinical Social Work is seeking post-discharge placement for this patient at the [place] level of care (*CSW will initial, date and re-position this form in  chart as items are completed):  No (Daughter provided CSW with facility preference)   Patient/family provided with [place] Work Department's list of facilities offering this level of care within the geographic area requested by the patient (or if unable, by the patient's family).  Yes   Patient/family informed of their freedom to choose among providers that offer the needed level of care, that participate in Medicare, Medicaid or managed care program needed by the patient, have an available bed and are willing to accept the patient.  No   Patient/family informed of [facility]'s ownership interest in [facility] and [facility], as well as of the fact that they are under no obligation to receive care at these facilities.  PASRR submitted to EDS on       PASRR number received on       Existing PASRR number confirmed on 05/17/17     FL2 transmitted to all facilities in geographic area requested by pt/family on 05/17/17     FL2 transmitted to all facilities within larger geographic area on       Patient informed that his/her managed care company has contracts with or will negotiate with certain facilities, including the following:        Yes ([place])   Patient/family informed of bed offers received.  Patient chooses bed at [facility], [place]     Physician recommends and patient chooses bed at      Patient to be transferred to [place], [place] on 05/19/17.  Patient to be transferred to facility by PTAR     Patient family notified on 05/19/17 of transfer.  Name of family member notified:  Daughter     PHYSICIAN       Additional Comment:    _______________________________________________ Benard Halsted, [place] 05/19/2017, 11:29 AM

## 2017-05-19 NOTE — Progress Notes (Signed)
PT Cancellation Note  Patient Details Name: KRISTYNE WOODRING MRN: [id number] DOB: 1932-09-23   Cancelled Treatment:    Reason Eval/Treat Not Completed: Patient declined, no reason specified. 05/19/2017  Donnella Sham, PT [phone]  (pager)   Tessie Fass Mio Schellinger 05/19/2017, 3:04 PM

## 2017-05-19 NOTE — Care Management Obs Status (Signed)
[facility] NOTIFICATION   Patient Details  Name: [name] MRN: [id number] Date of Birth: 08-19-1933   Medicare Observation Status Notification Given:  Yes    Sharin Mons, RN 05/19/2017, 4:40 PM

## 2017-05-20 ENCOUNTER — Emergency Department (HOSPITAL_COMMUNITY): Payer: Medicare Other

## 2017-05-20 ENCOUNTER — Emergency Department (HOSPITAL_COMMUNITY)
Admission: EM | Admit: 2017-05-20 | Discharge: 2017-05-20 | Disposition: A | Discharge status: Home / Self Care | Payer: Medicare Other | Attending: Physician Assistant | Admitting: Physician Assistant

## 2017-05-20 ENCOUNTER — Encounter (HOSPITAL_COMMUNITY): Payer: Self-pay | Admitting: Emergency Medicine

## 2017-05-20 DIAGNOSIS — Y939 Activity, unspecified: Secondary | ICD-10-CM | POA: Insufficient documentation

## 2017-05-20 DIAGNOSIS — W010XXA Fall on same level from slipping, tripping and stumbling without subsequent striking against object, initial encounter: Secondary | ICD-10-CM | POA: Insufficient documentation

## 2017-05-20 DIAGNOSIS — N183 Chronic kidney disease, stage 3 (moderate): Secondary | ICD-10-CM | POA: Diagnosis not present

## 2017-05-20 DIAGNOSIS — Z96651 Presence of right artificial knee joint: Secondary | ICD-10-CM | POA: Insufficient documentation

## 2017-05-20 DIAGNOSIS — E1122 Type 2 diabetes mellitus with diabetic chronic kidney disease: Secondary | ICD-10-CM | POA: Insufficient documentation

## 2017-05-20 DIAGNOSIS — Y929 Unspecified place or not applicable: Secondary | ICD-10-CM | POA: Diagnosis not present

## 2017-05-20 DIAGNOSIS — M542 Cervicalgia: Secondary | ICD-10-CM | POA: Insufficient documentation

## 2017-05-20 DIAGNOSIS — Y999 Unspecified external cause status: Secondary | ICD-10-CM | POA: Diagnosis not present

## 2017-05-20 DIAGNOSIS — M25522 Pain in left elbow: Secondary | ICD-10-CM | POA: Diagnosis not present

## 2017-05-20 DIAGNOSIS — W19XXXA Unspecified fall, initial encounter: Secondary | ICD-10-CM

## 2017-05-20 DIAGNOSIS — Z79899 Other long term (current) drug therapy: Secondary | ICD-10-CM | POA: Insufficient documentation

## 2017-05-20 DIAGNOSIS — I129 Hypertensive chronic kidney disease with stage 1 through stage 4 chronic kidney disease, or unspecified chronic kidney disease: Secondary | ICD-10-CM | POA: Diagnosis not present

## 2017-05-20 DIAGNOSIS — Z85828 Personal history of other malignant neoplasm of skin: Secondary | ICD-10-CM | POA: Diagnosis not present

## 2017-05-20 DIAGNOSIS — M25551 Pain in right hip: Secondary | ICD-10-CM | POA: Diagnosis not present

## 2017-05-20 NOTE — ED Notes (Signed)
Pt to xray

## 2017-05-20 NOTE — Discharge Instructions (Signed)
Please return with any concenrs.

## 2017-05-20 NOTE — ED Provider Notes (Signed)
[facility] DEPT Provider Note   CSN: [id number] Arrival date & time: 05/20/17  7829     History   Chief Complaint Chief Complaint  Patient presents with  . Fall    HPI [name] is a 83 y.o. female.  HPI   Patient's 65 -year-old female presenting with mechanical fall. Patient slipped on some water in her new home. She recently [place] [facility]. Patient complains of mild neck pain, right hip pain, left elbow pain. Patient has no abrasions requiring tetanus update.  Past Medical History:  Diagnosis Date  . Anemia    as a teenager  . Arthritis   . Cancer ([place])    skin  . Cholelithiasis   . Chronic insomnia   . Chronic low back pain   . Complication of anesthesia   . DDD (degenerative disc disease), lumbar   . Diabetes mellitus    Iddm x 12 years  . Diabetic peripheral neuropathy ([place])   . Essential tremor 04/01/2015  . GERD (gastroesophageal reflux disease)   . Hx of acute renal failure 02/2014   admitted to [facility]  . Hx: UTI (urinary tract infection) 02/2014  . Hyperlipemia   . Hypertension   . Obesity   . Peripheral edema   . Pneumonia    as a child  . PONV (postoperative nausea and vomiting)    pt states Zofran not that helpful in the past, Phenergan works best for her  . Staph infection    1970's  . Umbilical hernia   . Wrist fracture    left October 2014    Patient Active Problem List   Diagnosis Date Noted  . Left ventricular diastolic dysfunction with preserved systolic function 56/21/3086  . Closed compression fracture of L1 lumbar vertebra ([place]) 05/17/2017  . Intractable back pain 05/17/2017  . Diabetes mellitus, insulin dependent (IDDM), controlled ([place]) 05/17/2017  . GERD (gastroesophageal reflux disease) 05/17/2017  . Hypertension 05/17/2017  . Hyperlipemia 05/17/2017  . Back pain 05/15/2017  . Syncope 01/05/2017  . Tachycardia 01/04/2017  . CKD (chronic kidney disease) stage 3, GFR 30-59 ml/min 01/04/2017  .  Diabetes mellitus with complication ([place]) 57/84/6962  . Peripheral neuropathy 01/04/2017  . Orthostatic hypotension 01/04/2017  . Fall 12/24/2016  . Acute encephalopathy 12/24/2016  . Depression 12/24/2016  . Acute renal failure superimposed on stage 3 chronic kidney disease ([place]) 12/24/2016  . Vertigo 05/16/2016  . Chest pain 05/15/2016  . Essential tremor 04/01/2015  . Chronic low back pain 04/01/2015  . Lumbar herniated disc 02/06/2015  . Lumbar radiculopathy 03/20/2014  . UTI (lower urinary tract infection) 03/18/2014  . Hyperglycemia 03/18/2014  . Dyslipidemia 03/18/2014  . Lumbar stenosis 03/16/2014  . HTN (hypertension) 02/12/2014  . Diabetes mellitus ([place]) 02/12/2014  . Acute renal failure ([place]) 02/11/2014  . H/O total knee replacement 08/11/2013  . Gallstones 10/01/2011    Past Surgical History:  Procedure Laterality Date  . ABDOMINAL HYSTERECTOMY    . BACK SURGERY  2005  . CARDIAC CATHETERIZATION  2000   Dr Einar Gip  . CATARACT EXTRACTION Bilateral    w/ lens implants  . COLONOSCOPY  >10 years  . EYE SURGERY    . HERNIA REPAIR    . IR KYPHO LUMBAR INC FX REDUCE BONE BX UNI/BIL CANNULATION INC/IMAGING  05/18/2017  . LUMBAR LAMINECTOMY/DECOMPRESSION MICRODISCECTOMY Left 03/16/2014   Procedure: LUMBAR LAMINECTOMY/DECOMPRESSION MICRODISCECTOMY 1 LEVEL  lumbar level three/four;  Surgeon: [name], MD;  Location: [facility];  Service: Neurosurgery;  Laterality: Left;  . LUMBAR LAMINECTOMY/DECOMPRESSION MICRODISCECTOMY Right 02/06/2015   Procedure: Right L4-5 Microdiskectomy;  Surgeon: Leeroy Cha, MD;  Location: [facility];  Service: Neurosurgery;  Laterality: Right;  Right L4-5 Microdiskectomy  . NECK SURGERY    . OOPHORECTOMY    . TOTAL KNEE ARTHROPLASTY Right     OB History    No data available       Home Medications    Prior to Admission medications   Medication Sig Start Date End Date Taking? Authorizing Provider  acetaminophen (TYLENOL) 325 MG tablet  Take 2 tablets (650 mg total) by mouth every 6 (six) hours as needed for mild pain (or Fever >/= 101). 05/19/17   Hosie Poisson, MD  calcitonin, salmon, (MIACALCIN/FORTICAL) 200 UNIT/ACT nasal spray Place 1 spray into alternate nostrils daily. 05/20/17   Hosie Poisson, MD  DULoxetine (CYMBALTA) 20 MG capsule Take 1 capsule (20 mg total) by mouth daily. 01/25/17   Kathrynn Ducking, MD  HUMALOG MIX 75/25 KWIKPEN (75-25) 100 UNIT/ML Kwikpen Inject 15-60 Units into the skin 2 (two) times daily. Inject 60 units in the am and Inject 15 in the pm. 06/04/15   [provider]  losartan (COZAAR) 50 MG tablet Take 50 mg by mouth daily. 04/28/17   [provider]  midodrine (PROAMATINE) 5 MG tablet Take 5 mg by mouth 2 (two) times daily. 05/03/17   [provider]  propranolol (INDERAL) 10 MG tablet Take 20 mg by mouth 2 (two) times daily.  03/18/17   [provider]  traMADol (ULTRAM) 50 MG tablet Take 1 tablet (50 mg total) by mouth 2 (two) times daily. 05/19/17   Hosie Poisson, MD    Family History Family History  Problem Relation Age of Onset  . Cancer Brother        prostate  . Heart disease Brother   . Alzheimer's disease Brother   . Stroke Mother   . Hypertension Mother   . COPD Brother   . Cancer Brother        Prostate cancer  . Hypertension Sister        Congestive heart failure  . Heart attack Son     Social History Social History  Substance Use Topics  . Smoking status: Never Smoker  . Smokeless tobacco: Never Used  . Alcohol use No     Allergies   Ciprofloxacin; Codeine; Dilaudid [hydromorphone hcl]; Mysoline [primidone]; and Oxycodone   Review of Systems Review of Systems  Constitutional: Negative for activity change.  Respiratory: Negative for shortness of breath.   Cardiovascular: Negative for chest pain.  Gastrointestinal: Negative for abdominal pain.     Physical Exam Updated Vital Signs BP (!) 156/112 (BP Location: Left Arm)    Pulse 74   Temp 98 F (36.7 C) (Oral)   Resp 16   Ht 5\' 8"  (1.727 m)   Wt 99.3 kg (219 lb)   SpO2 96%   BMI 33.30 kg/m   Physical Exam  Constitutional: She is oriented to person, place, and time. She appears well-developed and well-nourished.  HENT:  Head: Normocephalic and atraumatic.  Eyes: Right eye exhibits no discharge.  Cardiovascular: Normal rate, regular rhythm and normal heart sounds.   No murmur heard. Pulmonary/Chest: Effort normal and breath sounds normal. She has no wheezes. She has no rales.  Abdominal: Soft. She exhibits no distension. There is no tenderness.  Musculoskeletal:  Old bruising R elbow. New brusing L elbow.   Small pain  in ROM of R hip, but full ROM and no bruising.   Small pain in c spine, but good ROM. No external signs of trauma except L elbow.  Neurological: She is oriented to person, place, and time.  Skin: Skin is warm and dry. She is not diaphoretic.  Psychiatric: She has a normal mood and affect.  Nursing note and vitals reviewed.    ED Treatments / Results  Labs (all labs ordered are listed, but only abnormal results are displayed) Labs Reviewed - No data to display  EKG  EKG Interpretation None       Radiology Ir Kypho Lumbar Inc Fx Reduce Bone Bx Uni/bil Cannulation Inc/imaging  Result Date: 05/19/2017 INDICATION: Severe low back pain secondary to compression fracture at L1. EXAM: BALLOON KYPHOPLASTY AT L1 COMPARISON:  MRI scan of the lumbosacral spine. MEDICATIONS: As antibiotic prophylaxis, Ancef 2 g IV was ordered pre-procedure and administered intravenously within 1 hour of incision. ANESTHESIA/SEDATION: Moderate (conscious) sedation was employed during this procedure. A total of Versed 1.5 mg and Fentanyl 50 mcg was administered intravenously. Moderate Sedation Time: 20 minutes. The patient's level of consciousness and vital signs were monitored continuously by radiology nursing throughout the procedure under my direct  supervision. FLUOROSCOPY TIME:  Fluoroscopy Time: 9 minutes 18 seconds (9735 mGy) COMPLICATIONS: None immediate. PROCEDURE: Following a full explanation of the procedure along with the potential associated complications, an informed witnessed consent was obtained. The patient was placed prone on the fluoroscopic table. The skin overlying the thoracolumbar region was then prepped and draped in the usual sterile fashion. The right pedicle at L1 was then infiltrated with 0.25% bupivacaine followed by the advancement of an 11-gauge Jamshidi needle through the right pedicle into the posterior one-third at L1. This was then exchanged for a Kyphon advanced osteo introducer system comprised of a working cannula and a Kyphon osteo drill. This combination was then advanced over a Kyphon osteo bone pin until the tip of the Kyphon osteo drill was in the posterior third at L1. At this time, the bone pin was removed. In a medial trajectory, the combination was advanced until the tip of the working cannula was inside the posterior one-third at L1. The osteo drill was removed and a core sample sent for pathologic analysis. Through the working cannula, a Kyphon bone biopsy device was advanced to within 5 mm of the anterior aspect of L1. A core sample from this was also sent for pathologic analysis. Through the working cannula, a Kyphon inflatable bone tamp 20 x 3 was advanced and positioned with the distal marker 5 mm from the anterior aspect of L1. Crossing of the midline was seen on the AP projection. At this time, the balloon was expanded using contrast via a Kyphon inflation syringe device via microtubing. Inflations were continued until there was apposition with the superior endplate. At this time, methylmethacrylate mixture was reconstituted with Tobramycin in the Kyphon bone mixing device system. This was then loaded onto the Kyphon bone fillers. The balloon was deflated and removed followed by the instillation of 4 and half  bone filler equivalents of methylmethacrylate mixture at L1 with excellent filling in the AP and lateral projections. No extravasation was noted in the disk spaces or posteriorly into the spinal canal. No epidural venous contamination was seen. The working cannula and the bone filler were then retrieved and removed. Hemostasis was achieved at the skin site. The patient was then returned to the floor in a stable comfortable condition. IMPRESSION: 1.  Status post fluoroscopic-guided needle placement for deep core bone biopsy at L1. 2. Status post vertebral body augmentation using balloon kyphoplasty at L1 as described without event. A biopsy was obtained in view of the patient's MRI scan of March and April being essentially negative. The repeat MRI shows a compression fracture at L1 with edema extending into the proximal pedicles. Additionally noted was a small focal area of FLAIR and T2 hyperintensity along the anterior aspect of T12 without evidence of cortical break. This probably represents a sequela of a mild contusion. Additionally, the patient and the daughter report that a bone density performed last year was reportedly negative for osteopenia or osteoporosis. Because of this and after further discussion with the patient's daughter, it was decided to have the tissue examined by pathology. Also the patient has a history of removal of a skin cancer histology unknown in the past. Also there is a family history of melanoma. This was discussed with the patient's daughter. The patient's daughter was asked to check with the patient's primary care physician provider in 24-48 hours regarding the report of the biopsy. Questions were answered to their satisfaction. They both leave with good understanding and agreement with the above management plan. Electronically Signed   By: Luanne Bras M.D.   On: 05/18/2017 17:08    Procedures Procedures (including critical care time)  Medications Ordered in  ED Medications - No data to display   Initial Impression / Assessment and Plan / ED Course  I have reviewed the triage vital signs and the nursing notes.  Pertinent labs & imaging results that were available during my care of the patient were reviewed by me and considered in my medical decision making (see chart for details).     Well appearing edlerly female with recent work up (yesterday) and discharge to NH presentign with mechanical fall. Small bruising to L elbow.  Negaive imaging. Pt ambulatory, discusseed with family, plan to return to NH.    Final Clinical Impressions(s) / ED Diagnoses   Final diagnoses:  None    New Prescriptions New Prescriptions   No medications on file     Macarthur Critchley, MD 05/20/17 1026

## 2017-05-20 NOTE — ED Notes (Signed)
Pt ambulated to a wheelchair requiring assistance from myself and her son.  Pt uses a walker at home.  Pt tolerated well walking and standing with assistance.

## 2017-05-20 NOTE — ED Triage Notes (Signed)
Pt here from home after fall from standing , pt is c/o pain to bil hip  left elbow and back of her head , no loc

## 2017-05-24 ENCOUNTER — Other Ambulatory Visit: Payer: Self-pay | Admitting: Adult Health

## 2017-06-01 ENCOUNTER — Telehealth (HOSPITAL_COMMUNITY): Payer: Self-pay

## 2017-06-01 ENCOUNTER — Emergency Department (HOSPITAL_COMMUNITY)
Admission: EM | Admit: 2017-06-01 | Discharge: 2017-06-01 | Disposition: A | Discharge status: Home / Self Care | Payer: Medicare Other | Attending: Emergency Medicine | Admitting: Emergency Medicine

## 2017-06-01 DIAGNOSIS — Z5321 Procedure and treatment not carried out due to patient leaving prior to being seen by health care provider: Secondary | ICD-10-CM | POA: Diagnosis not present

## 2017-06-01 DIAGNOSIS — M549 Dorsalgia, unspecified: Secondary | ICD-10-CM | POA: Diagnosis present

## 2017-06-01 NOTE — ED Triage Notes (Signed)
Pt returns c/o continued  L1 pain from fall on 9/13. Pt reports taking tramadol this morning with little relief of symptoms.

## 2017-06-01 NOTE — Telephone Encounter (Signed)
Called to schedule 2 wk f/u, left message for pt to return call. AW 

## 2017-06-01 NOTE — ED Notes (Signed)
Pt called to obtain vitals, pt did not answer x3.

## 2017-06-01 NOTE — Telephone Encounter (Signed)
Spoke with pt's daughter. She called stating that pt was still having back pain and that they were in the ED. She wanted to know if Dr. Estanislado Pandy could order another MRI since it sounded like she had another fracture but I informed her that she would have to come in for f/u first so that he could assess where her pain was exactly. She said that she would wait and see what the ER doc says since they are already here and her mother is in so much pain and call me back. AW

## 2017-06-02 ENCOUNTER — Other Ambulatory Visit: Payer: Self-pay | Admitting: Neurology

## 2017-06-02 ENCOUNTER — Other Ambulatory Visit (HOSPITAL_COMMUNITY): Payer: Self-pay | Admitting: Interventional Radiology

## 2017-06-02 DIAGNOSIS — S32010A Wedge compression fracture of first lumbar vertebra, initial encounter for closed fracture: Secondary | ICD-10-CM

## 2017-06-02 DIAGNOSIS — M545 Low back pain: Secondary | ICD-10-CM

## 2017-06-14 ENCOUNTER — Ambulatory Visit (HOSPITAL_COMMUNITY)
Admission: RE | Admit: 2017-06-14 | Discharge: 2017-06-14 | Disposition: A | Discharge status: Home / Self Care | Payer: Medicare Other | Source: Ambulatory Visit | Attending: Interventional Radiology | Admitting: Interventional Radiology

## 2017-06-14 ENCOUNTER — Other Ambulatory Visit (HOSPITAL_COMMUNITY): Payer: Self-pay | Admitting: Interventional Radiology

## 2017-06-14 DIAGNOSIS — S32010A Wedge compression fracture of first lumbar vertebra, initial encounter for closed fracture: Secondary | ICD-10-CM

## 2017-06-14 DIAGNOSIS — M545 Low back pain: Secondary | ICD-10-CM

## 2017-06-14 HISTORY — PX: IR RADIOLOGIST EVAL & MGMT: IMG5224

## 2017-06-15 ENCOUNTER — Ambulatory Visit (INDEPENDENT_AMBULATORY_CARE_PROVIDER_SITE_OTHER): Payer: Medicare Other | Admitting: Neurology

## 2017-06-15 ENCOUNTER — Encounter: Payer: Self-pay | Admitting: Neurology

## 2017-06-15 VITALS — BP 192/81 | HR 66 | Ht 68.0 in | Wt 217.0 lb

## 2017-06-15 DIAGNOSIS — M549 Dorsalgia, unspecified: Secondary | ICD-10-CM | POA: Diagnosis not present

## 2017-06-15 DIAGNOSIS — G250 Essential tremor: Secondary | ICD-10-CM

## 2017-06-15 DIAGNOSIS — I951 Orthostatic hypotension: Secondary | ICD-10-CM

## 2017-06-15 MED ORDER — DULOXETINE HCL 20 MG PO CPEP: 20.0000 mg | ORAL_CAPSULE | Freq: Two times a day (BID) | ORAL | 3 refills | Status: DC

## 2017-06-15 NOTE — Patient Instructions (Signed)
We will increase the Cymbalta to 20 mg twice a day. Call if physical therapy does not start.

## 2017-06-15 NOTE — Progress Notes (Signed)
Reason for visit: Gait disorder, tremor  [name] is an 83 y.o. female  History of present illness:  [name] is an 83 year old right-handed white female with a history of an essential tremor, she has a history of diabetes and has an autonomic neuropathy associated with this causing orthostatic hypotension. The patient has had chronic low back pain, she has fallen on several occasions since I have seen her last. She was in the hospital on 05/14/2017, the patient sustained a mild L1 compression fracture, she underwent kyphoplasty. She fell again on 05/20/2017. The patient was at [facility], but she currently is back in her own home environment. She is to have physical therapy come out to the house in the near future. She has reported some neck stiffness and discomfort following one of the falls. She has no pain radiating down the arms, but she does have her ongoing low back pain with some right leg pain and right hip pain. The patient has not had any recent episodes of collapse or syncope. Most of her falls have been associated with not using her walker. The kyphoplasty did not help her back pain much. She returns for an evaluation.  Past Medical History:  Diagnosis Date  . Anemia    as a teenager  . Arthritis   . Cancer ([place])    skin  . Cholelithiasis   . Chronic insomnia   . Chronic low back pain   . Complication of anesthesia   . DDD (degenerative disc disease), lumbar   . Diabetes mellitus    Iddm x 12 years  . Diabetic peripheral neuropathy ([place])   . Essential tremor 04/01/2015  . GERD (gastroesophageal reflux disease)   . Hx of acute renal failure 02/2014   admitted to [facility]  . Hx: UTI (urinary tract infection) 02/2014  . Hyperlipemia   . Hypertension   . Obesity   . Peripheral edema   . Pneumonia    as a child  . PONV (postoperative nausea and vomiting)    pt states Zofran not that helpful in the past, Phenergan works best for her  . Staph  infection    1970's  . Umbilical hernia   . Wrist fracture    left October 2014    Past Surgical History:  Procedure Laterality Date  . ABDOMINAL HYSTERECTOMY    . BACK SURGERY  2005  . CARDIAC CATHETERIZATION  2000   Dr Einar Gip  . CATARACT EXTRACTION Bilateral    w/ lens implants  . COLONOSCOPY  >10 years  . EYE SURGERY    . HERNIA REPAIR    . IR KYPHO LUMBAR INC FX REDUCE BONE BX UNI/BIL CANNULATION INC/IMAGING  05/18/2017  . LUMBAR LAMINECTOMY/DECOMPRESSION MICRODISCECTOMY Left 03/16/2014   Procedure: LUMBAR LAMINECTOMY/DECOMPRESSION MICRODISCECTOMY 1 LEVEL  lumbar level three/four;  Surgeon: [name], MD;  Location: [place];  Service: Neurosurgery;  Laterality: Left;  . LUMBAR LAMINECTOMY/DECOMPRESSION MICRODISCECTOMY Right 02/06/2015   Procedure: Right L4-5 Microdiskectomy;  Surgeon: Leeroy Cha, MD;  Location: [place];  Service: Neurosurgery;  Laterality: Right;  Right L4-5 Microdiskectomy  . NECK SURGERY    . OOPHORECTOMY    . TOTAL KNEE ARTHROPLASTY Right     Family History  Problem Relation Age of Onset  . Cancer Brother        prostate  . Heart disease Brother   . Alzheimer's disease Brother   . Stroke Mother   . Hypertension Mother   .  COPD Brother   . Cancer Brother        Prostate cancer  . Hypertension Sister        Congestive heart failure  . Heart attack Son     Social history:  reports that she has never smoked. She has never used smokeless tobacco. She reports that she does not drink alcohol or use drugs.    Allergies  Allergen Reactions  . Ciprofloxacin Other (See Comments)    Acute kidney failure  . Codeine Nausea And Vomiting  . Dilaudid [Hydromorphone Hcl] Nausea And Vomiting and Other (See Comments)    Severe sweating  . Mysoline [Primidone] Nausea Only  . Oxycodone Nausea And Vomiting    Medications:  Prior to Admission medications   Medication Sig Start Date End Date Taking? Authorizing Provider  acetaminophen (TYLENOL) 325  MG tablet Take 2 tablets (650 mg total) by mouth every 6 (six) hours as needed for mild pain (or Fever >/= 101). 05/19/17  Yes Hosie Poisson, MD  DULoxetine (CYMBALTA) 20 MG capsule TAKE 1 CAPSULE BY MOUTH EVERY DAY 06/02/17  Yes Kathrynn Ducking, MD  HUMALOG MIX 75/25 KWIKPEN (75-25) 100 UNIT/ML Kwikpen Inject 15-60 Units into the skin 2 (two) times daily. Inject 60 units in the am and Inject 15 in the pm. 06/04/15  Yes [provider]  losartan (COZAAR) 50 MG tablet Take 50 mg by mouth daily. 04/28/17  Yes [provider]  midodrine (PROAMATINE) 5 MG tablet Take 5 mg by mouth 2 (two) times daily. 05/03/17  Yes [provider]  propranolol (INDERAL) 10 MG tablet Take 20 mg by mouth 2 (two) times daily.  03/18/17  Yes [provider]  traMADol (ULTRAM) 50 MG tablet Take 1 tablet (50 mg total) by mouth 2 (two) times daily. 05/19/17  Yes Hosie Poisson, MD    ROS:  Out of a complete 14 system review of symptoms, the patient complains only of the following symptoms, and all other reviewed systems are negative.  Incontinence of the bladder Back pain, neck pain Memory loss, tremors  Blood pressure (!) 192/81, pulse 66, height 5\' 8"  (1.727 m), weight 217 lb (98.4 kg).   Blood pressure, right arm, sitting is 148/60. Blood pressure, right arm, standing is 104/58.  Physical Exam  General: The patient is alert and cooperative at the time of the examination.  Skin: No significant peripheral edema is noted.   Neurologic Exam  Mental status: The patient is alert and oriented x 3 at the time of the examination. The patient has apparent normal recent and remote memory, with an apparently normal attention span and concentration ability.   Cranial nerves: Facial symmetry is present. Speech is normal, no aphasia or dysarthria is noted. Extraocular movements are full. Visual fields are full. A vocal tremor is noted.  Motor: The patient has good strength in all 4  extremities.  Sensory examination: Soft touch sensation is symmetric on the face, arms, and legs.  Coordination: The patient has good finger-nose-finger and heel-to-shin bilaterally. Tremors are noted with finger-nose-finger bilaterally.  Gait and station: The patient has a slightly wide-based gait, the patient walks with a walker. Tandem gait was not attempted. Romberg is negative. No drift is seen.  Reflexes: Deep tendon reflexes are symmetric.   Assessment/Plan:  1. Essential tremor  2. Gait disorder  3. Chronic low back pain  4. Orthostatic hypotension  5. Diabetes, autonomic neuropathy  The patient needs to be safe with walking and always use the  walker. The patient is having some neck discomfort following a fall, physical therapy should be coming out to her home in the near future. The patient will be increased on Cymbalta taking 20 mg twice daily. She will continue the propranolol taking 10 mg 4 times daily for the tremor. Off of propranolol she developed a significant tachycardia. The patient will follow-up in about 6 months.  Jill Alexanders MD 06/15/2017 10:41 AM  [facility] [address] [place], [place] [address]  Phone [phone] Fax [phone]

## 2017-06-16 ENCOUNTER — Encounter (HOSPITAL_COMMUNITY): Payer: Self-pay | Admitting: Interventional Radiology

## 2017-06-17 ENCOUNTER — Other Ambulatory Visit (HOSPITAL_COMMUNITY): Payer: Self-pay | Admitting: Interventional Radiology

## 2017-06-17 DIAGNOSIS — W19XXXA Unspecified fall, initial encounter: Secondary | ICD-10-CM

## 2017-06-17 DIAGNOSIS — M545 Low back pain: Secondary | ICD-10-CM

## 2017-06-17 DIAGNOSIS — Z8781 Personal history of (healed) traumatic fracture: Secondary | ICD-10-CM

## 2017-06-17 DIAGNOSIS — Y92129 Unspecified place in nursing home as the place of occurrence of the external cause: Secondary | ICD-10-CM

## 2017-06-23 ENCOUNTER — Ambulatory Visit (HOSPITAL_COMMUNITY)
Admission: RE | Admit: 2017-06-23 | Discharge: 2017-06-23 | Disposition: A | Discharge status: Home / Self Care | Payer: Medicare Other | Source: Ambulatory Visit | Attending: Interventional Radiology | Admitting: Interventional Radiology

## 2017-06-23 DIAGNOSIS — M48061 Spinal stenosis, lumbar region without neurogenic claudication: Secondary | ICD-10-CM | POA: Insufficient documentation

## 2017-06-23 DIAGNOSIS — M4807 Spinal stenosis, lumbosacral region: Secondary | ICD-10-CM | POA: Diagnosis not present

## 2017-06-23 DIAGNOSIS — Z8781 Personal history of (healed) traumatic fracture: Secondary | ICD-10-CM | POA: Insufficient documentation

## 2017-06-23 DIAGNOSIS — M545 Low back pain: Secondary | ICD-10-CM | POA: Diagnosis not present

## 2017-06-23 DIAGNOSIS — M4854XA Collapsed vertebra, not elsewhere classified, thoracic region, initial encounter for fracture: Secondary | ICD-10-CM | POA: Insufficient documentation

## 2017-06-23 DIAGNOSIS — Y92129 Unspecified place in nursing home as the place of occurrence of the external cause: Secondary | ICD-10-CM | POA: Insufficient documentation

## 2017-06-23 DIAGNOSIS — W19XXXA Unspecified fall, initial encounter: Secondary | ICD-10-CM | POA: Diagnosis not present

## 2017-06-28 ENCOUNTER — Other Ambulatory Visit (HOSPITAL_COMMUNITY): Payer: Self-pay | Admitting: Interventional Radiology

## 2017-06-28 DIAGNOSIS — W19XXXA Unspecified fall, initial encounter: Secondary | ICD-10-CM

## 2017-06-28 DIAGNOSIS — M545 Low back pain: Secondary | ICD-10-CM

## 2017-06-28 DIAGNOSIS — Y92129 Unspecified place in nursing home as the place of occurrence of the external cause: Principal | ICD-10-CM

## 2017-06-28 DIAGNOSIS — S22080A Wedge compression fracture of T11-T12 vertebra, initial encounter for closed fracture: Secondary | ICD-10-CM

## 2017-06-30 ENCOUNTER — Other Ambulatory Visit: Payer: Self-pay | Admitting: General Surgery

## 2017-06-30 ENCOUNTER — Other Ambulatory Visit: Payer: Self-pay | Admitting: Radiology

## 2017-07-01 ENCOUNTER — Ambulatory Visit (HOSPITAL_COMMUNITY)
Admission: RE | Admit: 2017-07-01 | Discharge: 2017-07-01 | Disposition: A | Discharge status: Home / Self Care | Payer: Medicare Other | Source: Ambulatory Visit | Attending: Interventional Radiology | Admitting: Interventional Radiology

## 2017-07-01 ENCOUNTER — Encounter: Payer: Self-pay | Admitting: *Deleted

## 2017-07-01 ENCOUNTER — Encounter (HOSPITAL_COMMUNITY): Payer: Self-pay

## 2017-07-01 DIAGNOSIS — F5104 Psychophysiologic insomnia: Secondary | ICD-10-CM | POA: Insufficient documentation

## 2017-07-01 DIAGNOSIS — M5136 Other intervertebral disc degeneration, lumbar region: Secondary | ICD-10-CM | POA: Insufficient documentation

## 2017-07-01 DIAGNOSIS — E1142 Type 2 diabetes mellitus with diabetic polyneuropathy: Secondary | ICD-10-CM | POA: Insufficient documentation

## 2017-07-01 DIAGNOSIS — E669 Obesity, unspecified: Secondary | ICD-10-CM | POA: Diagnosis not present

## 2017-07-01 DIAGNOSIS — M1990 Unspecified osteoarthritis, unspecified site: Secondary | ICD-10-CM | POA: Diagnosis not present

## 2017-07-01 DIAGNOSIS — Z6832 Body mass index (BMI) 32.0-32.9, adult: Secondary | ICD-10-CM | POA: Diagnosis not present

## 2017-07-01 DIAGNOSIS — G250 Essential tremor: Secondary | ICD-10-CM | POA: Diagnosis not present

## 2017-07-01 DIAGNOSIS — K219 Gastro-esophageal reflux disease without esophagitis: Secondary | ICD-10-CM | POA: Insufficient documentation

## 2017-07-01 DIAGNOSIS — E785 Hyperlipidemia, unspecified: Secondary | ICD-10-CM | POA: Insufficient documentation

## 2017-07-01 DIAGNOSIS — G8929 Other chronic pain: Secondary | ICD-10-CM | POA: Insufficient documentation

## 2017-07-01 DIAGNOSIS — S22089A Unspecified fracture of T11-T12 vertebra, initial encounter for closed fracture: Secondary | ICD-10-CM | POA: Insufficient documentation

## 2017-07-01 DIAGNOSIS — Z794 Long term (current) use of insulin: Secondary | ICD-10-CM | POA: Diagnosis not present

## 2017-07-01 DIAGNOSIS — I10 Essential (primary) hypertension: Secondary | ICD-10-CM | POA: Insufficient documentation

## 2017-07-01 DIAGNOSIS — S22080A Wedge compression fracture of T11-T12 vertebra, initial encounter for closed fracture: Secondary | ICD-10-CM

## 2017-07-01 DIAGNOSIS — W19XXXA Unspecified fall, initial encounter: Secondary | ICD-10-CM | POA: Insufficient documentation

## 2017-07-01 DIAGNOSIS — M545 Low back pain: Secondary | ICD-10-CM

## 2017-07-01 DIAGNOSIS — Y92129 Unspecified place in nursing home as the place of occurrence of the external cause: Secondary | ICD-10-CM

## 2017-07-01 HISTORY — PX: IR KYPHO THORACIC WITH BONE BIOPSY: IMG5518

## 2017-07-01 LAB — CBC
HCT: 39.3 % (ref 36.0–46.0)
Hemoglobin: 13.2 g/dL (ref 12.0–15.0)
MCH: 28.6 pg (ref 26.0–34.0)
MCHC: 33.6 g/dL (ref 30.0–36.0)
MCV: 85.2 fL (ref 78.0–100.0)
Platelets: 230 10*3/uL (ref 150–400)
RBC: 4.61 MIL/uL (ref 3.87–5.11)
RDW: 14 % (ref 11.5–15.5)
WBC: 10.1 10*3/uL (ref 4.0–10.5)

## 2017-07-01 LAB — BASIC METABOLIC PANEL
Anion gap: 12 (ref 5–15)
BUN: 22 mg/dL — ABNORMAL HIGH (ref 6–20)
CO2: 27 mmol/L (ref 22–32)
Calcium: 9.2 mg/dL (ref 8.9–10.3)
Chloride: 102 mmol/L (ref 101–111)
Creatinine, Ser: 1.11 mg/dL — ABNORMAL HIGH (ref 0.44–1.00)
GFR calc Af Amer: 52 mL/min — ABNORMAL LOW (ref >60)
GFR calc non Af Amer: 45 mL/min — ABNORMAL LOW (ref >60)
Glucose, Bld: 99 mg/dL (ref 65–99)
Potassium: 4 mmol/L (ref 3.5–5.1)
Sodium: 141 mmol/L (ref 135–145)

## 2017-07-01 LAB — PROTIME-INR
INR: 1.13
Prothrombin Time: 14.4 seconds (ref 11.4–15.2)

## 2017-07-01 LAB — GLUCOSE, CAPILLARY
Glucose-Capillary: 119 mg/dL — ABNORMAL HIGH (ref 65–99)
Glucose-Capillary: 121 mg/dL — ABNORMAL HIGH (ref 65–99)

## 2017-07-01 LAB — APTT: aPTT: 29 seconds (ref 24–36)

## 2017-07-01 MED ORDER — CEFAZOLIN SODIUM-DEXTROSE 2-4 GM/100ML-% IV SOLN
INTRAVENOUS | Status: AC
  Filled 2017-07-01: qty 100

## 2017-07-01 MED ORDER — CEFAZOLIN SODIUM-DEXTROSE 2-4 GM/100ML-% IV SOLN
2.0000 g | INTRAVENOUS | Status: AC
  Administered 2017-07-01: 2 g via INTRAVENOUS

## 2017-07-01 MED ORDER — IOPAMIDOL (ISOVUE-300) INJECTION 61%
INTRAVENOUS | Status: AC
  Administered 2017-07-01: 10 mL
  Filled 2017-07-01: qty 50

## 2017-07-01 MED ORDER — LIDOCAINE HCL (PF) 1 % IJ SOLN
INTRAMUSCULAR | Status: AC | PRN
  Administered 2017-07-01: 20 mL

## 2017-07-01 MED ORDER — FENTANYL CITRATE (PF) 100 MCG/2ML IJ SOLN
INTRAMUSCULAR | Status: AC
  Filled 2017-07-01: qty 2

## 2017-07-01 MED ORDER — MIDAZOLAM HCL 2 MG/2ML IJ SOLN
INTRAMUSCULAR | Status: AC
  Filled 2017-07-01: qty 2

## 2017-07-01 MED ORDER — LIDOCAINE HCL 1 % IJ SOLN
INTRAMUSCULAR | Status: AC
  Filled 2017-07-01: qty 20

## 2017-07-01 MED ORDER — MIDAZOLAM HCL 2 MG/2ML IJ SOLN
INTRAMUSCULAR | Status: AC | PRN
  Administered 2017-07-01 (×2): 1 mg via INTRAVENOUS

## 2017-07-01 MED ORDER — SODIUM CHLORIDE 0.9 % IV SOLN: INTRAVENOUS | Status: DC

## 2017-07-01 MED ORDER — TOBRAMYCIN SULFATE 1.2 G IJ SOLR
INTRAMUSCULAR | Status: AC
  Filled 2017-07-01: qty 1.2

## 2017-07-01 MED ORDER — FENTANYL CITRATE (PF) 100 MCG/2ML IJ SOLN
INTRAMUSCULAR | Status: AC | PRN
  Administered 2017-07-01 (×2): 50 ug via INTRAVENOUS

## 2017-07-01 NOTE — H&P (Signed)
Chief Complaint: Back pain  Referring Physician(s): No referring physician/patient known to our service  Supervising Physician: Sandi Mariscal  Patient Status: [facility] - Out-pt  History of Present Illness: [name] is a 83 y.o. female who is known to our service for previous kyphoplasty of L1 back in September.  She has unfortunately had another fall and has sustained a T12 compression fracture.  She is here today for kyphoplasty.  She is not currently having pain as long as she is lying still, however she does report that the pain is interfering with her daily activities.  She states her pain can be a 8 or 9 out of 10 when she is up moving around.  She also reports some radiation of pain down her hip and into her foot but this is not new. She states she has had this for years and understands kyphoplasty will not help this.  She is NPO. No blood thinners.  Past Medical History:  Diagnosis Date  . Anemia    as a teenager  . Arthritis   . Cancer ([place])    skin  . Cholelithiasis   . Chronic insomnia   . Chronic low back pain   . Complication of anesthesia   . DDD (degenerative disc disease), lumbar   . Diabetes mellitus    Iddm x 12 years  . Diabetic peripheral neuropathy ([place])   . Essential tremor 04/01/2015  . GERD (gastroesophageal reflux disease)   . Hx of acute renal failure 02/2014   admitted to [facility]  . Hx: UTI (urinary tract infection) 02/2014  . Hyperlipemia   . Hypertension   . Obesity   . Peripheral edema   . Pneumonia    as a child  . PONV (postoperative nausea and vomiting)    pt states Zofran not that helpful in the past, Phenergan works best for her  . Staph infection    1970's  . Umbilical hernia   . Wrist fracture    left October 2014    Past Surgical History:  Procedure Laterality Date  . ABDOMINAL HYSTERECTOMY    . BACK SURGERY  2005  . CARDIAC CATHETERIZATION  2000   Dr Einar Gip  . CATARACT EXTRACTION Bilateral    w/ lens  implants  . COLONOSCOPY  >10 years  . EYE SURGERY    . HERNIA REPAIR    . IR KYPHO LUMBAR INC FX REDUCE BONE BX UNI/BIL CANNULATION INC/IMAGING  05/18/2017  . IR RADIOLOGIST EVAL & MGMT  06/14/2017  . LUMBAR LAMINECTOMY/DECOMPRESSION MICRODISCECTOMY Left 03/16/2014   Procedure: LUMBAR LAMINECTOMY/DECOMPRESSION MICRODISCECTOMY 1 LEVEL  lumbar level three/four;  Surgeon: [name], MD;  Location: [employer];  Service: Neurosurgery;  Laterality: Left;  . LUMBAR LAMINECTOMY/DECOMPRESSION MICRODISCECTOMY Right 02/06/2015   Procedure: Right L4-5 Microdiskectomy;  Surgeon: Leeroy Cha, MD;  Location: [place];  Service: Neurosurgery;  Laterality: Right;  Right L4-5 Microdiskectomy  . NECK SURGERY    . OOPHORECTOMY    . TOTAL KNEE ARTHROPLASTY Right     Allergies: Ciprofloxacin; Codeine; Dilaudid [hydromorphone hcl]; Mysoline [primidone]; and Oxycodone  Medications: Prior to Admission medications   Medication Sig Start Date End Date Taking? Authorizing Provider  acetaminophen (TYLENOL) 325 MG tablet Take 2 tablets (650 mg total) by mouth every 6 (six) hours as needed for mild pain (or Fever >/= 101). 05/19/17  Yes Hosie Poisson, MD  DULoxetine (CYMBALTA) 20 MG capsule Take 1 capsule (20 mg total) by mouth 2 (two) times daily. 06/15/17  Yes Kathrynn Ducking, MD  HUMALOG MIX 75/25 KWIKPEN (75-25) 100 UNIT/ML Kwikpen Inject 15-50 Units into the skin 2 (two) times daily. Inject 50 units in the am and Inject 15 in the pm. 06/04/15  Yes [provider]  Liniments (SALONPAS PAIN RELIEF PATCH EX) Place 1 patch onto the skin daily as needed.   Yes [provider]  losartan (COZAAR) 50 MG tablet Take 50 mg by mouth daily. 04/28/17  Yes [provider]  Menthol, Topical Analgesic, (BLUE-EMU MAXIMUM STRENGTH) 2.5 % LIQD Apply 1 application topically 2 (two) times daily as needed.   Yes [provider]  midodrine (PROAMATINE) 5 MG tablet Take 10 mg by mouth 2 (two) times  daily.  05/03/17  Yes [provider]  propranolol (INDERAL) 10 MG tablet Take 20 mg by mouth 2 (two) times daily.  03/18/17  Yes [provider]  traMADol (ULTRAM) 50 MG tablet Take 1 tablet (50 mg total) by mouth 2 (two) times daily. 05/19/17  Yes Hosie Poisson, MD     Family History  Problem Relation Age of Onset  . Cancer Brother        prostate  . Heart disease Brother   . Alzheimer's disease Brother   . Stroke Mother   . Hypertension Mother   . COPD Brother   . Cancer Brother        Prostate cancer  . Hypertension Sister        Congestive heart failure  . Heart attack Son     Social History   Social History  . Marital status: Widowed    Spouse name: N/A  . Number of children: 3  . Years of education: 49   Occupational History  . Retired     Social History Main Topics  . Smoking status: Never Smoker  . Smokeless tobacco: Never Used  . Alcohol use No  . Drug use: No  . Sexual activity: No   Other Topics Concern  . None   Social History Narrative   Patient lives at home alone.    Patient is widowed.    Patient has 3 children and 2 step children.    Patient is right handed.    Patient has a high school education and CNA certificate.     Patient is retired.    Patient does not drink caffeine.    Review of Systems: A 12 point ROS discussed  Review of Systems  Constitutional: Positive for activity change.  HENT: Negative.   Respiratory: Negative.   Cardiovascular: Negative.   Gastrointestinal: Negative.   Genitourinary: Negative.   Musculoskeletal: Positive for back pain.  Skin: Negative.   Neurological: Negative.   Hematological: Negative.   Psychiatric/Behavioral: Negative.     Vital Signs: BP (!) 179/80   Pulse 65   Temp 98.6 F (37 C) (Oral)   Resp 18   Ht 5\' 8"  (1.727 m)   Wt 217 lb (98.4 kg)   SpO2 95%   BMI 32.99 kg/m   Physical Exam  Constitutional: She is oriented to person, place, and time. She appears  well-developed.  HENT:  Head: Normocephalic and atraumatic.  Eyes: EOM are normal.  Neck: Normal range of motion.  Cardiovascular: Normal rate, regular rhythm and normal heart sounds.   Pulmonary/Chest: Effort normal and breath sounds normal. No respiratory distress. She has no wheezes.  Abdominal: Soft. She exhibits no distension. There is no tenderness.  Musculoskeletal: Normal range of motion.  Neurological: She is alert  and oriented to person, place, and time.  Skin: Skin is warm and dry.  Psychiatric: She has a normal mood and affect. Her behavior is normal. Judgment and thought content normal.  Vitals reviewed.   Imaging: Mr Lumbar Spine Wo Contrast  Result Date: 06/23/2017 CLINICAL DATA:  [facility] at nursing home 2 months ago. Low back and RIGHT hip pain. History of compression fracture and cement augmentation. EXAM: MRI LUMBAR SPINE WITHOUT CONTRAST TECHNIQUE: Multiplanar, multisequence MR imaging of the lumbar spine was performed. No intravenous contrast was administered. COMPARISON:  MRI lumbar spine May 14, 2017 FINDINGS: SEGMENTATION: For the purposes of this report, the last well-formed intervertebral disc will be reported as L5-S1. ALIGNMENT: Maintained lumbar lordosis. No malalignment. VERTEBRAE:Mild to moderate old L1 compression fracture with central low signal compatible with cement augmentation, mild bone marrow edema. New T12 inferior endplate mild compression fracture involving anterior column, no significant height loss. Remaining lumbar vertebral bodies intact. Similar moderate to severe L4-5 disc height loss with disc desiccation all levels. Moderate chronic discogenic endplate changes K8-1. CONUS MEDULLARIS: Conus medullaris terminates at T12-L1 and demonstrates normal morphology and signal characteristics. Cauda equina is normal. PARASPINAL AND SOFT TISSUES: Included prevertebral and paraspinal soft tissues are nonacute. Moderate to severe paraspinal muscle atrophy. DISC  LEVELS: T11-12: No disc bulge, canal stenosis nor neural foraminal narrowing. T12-L1: New small broad-based disc bulge, mild facet arthropathy and ligamentum flavum redundancy without canal stenosis or neural foraminal narrowing. L1-2: No disc bulge, canal stenosis nor neural foraminal narrowing. Mild facet arthropathy. L3-4: Status post laminectomies. No significant disc bulge. Moderate facet arthropathy without canal stenosis. Mild bilateral neural foraminal narrowing. L4-5: Status post RIGHT hemilaminectomy and facetectomy with granulation tissue effacing the RIGHT lateral recess. Mild LEFT facet arthropathy. No canal stenosis. Moderate to severe RIGHT neural foraminal narrowing. Encroaching upon the exited RIGHT L4 nerve. L5-S1: RIGHT hemilaminectomy. Moderate RIGHT greater than LEFT facets at arthropathy. No significant disc bulge. No canal stenosis. Moderate RIGHT neural foraminal narrowing. IMPRESSION: 1. Acute mild T12 inferior endplate compression fracture without significant height loss. Status post L1 cement augmentation without progressive height loss. 2. Stable postoperative change of lower lumbar spine. 3. No canal stenosis. Stable neural foraminal narrowing L3-4 through L5-S1: Moderate to severe on the RIGHT at L4-5. Electronically Signed   By: Elon Alas M.D.   On: 06/23/2017 13:56   Ir Radiologist Eval & Mgmt  Result Date: 06/16/2017 EXAM: ESTABLISHED PATIENT OFFICE VISIT CHIEF COMPLAINT: Right low back pain, hip pain with intermittent radicular pain. Current Pain Level: 1-10 HISTORY OF PRESENT ILLNESS: The patient is an 83 year old lady who is status post L1 vertebral body augmentation with a balloon kyphoplasty on May 18, 2017. This was for severe low back pain in the upper lumbar region secondary to a compression fracture. The procedure was uneventful. The patient was discharged to a care facility. According to the patient and the daughter, the following day at [facility],  the patient fell and landed on her buttocks. Since that time, the patient reports constant pain in the lower lumbar region, with intermittent sharp shooting radicular pain starting in the right hip region and extending laterally and then posteriorly and the anteriorly to the top of her right big toe. According to the patient this usually occurs while she is sitting towards the end of the day. The pain is only intermittent.  It is relieved by lying down. She denies any associated motor weakness or change in the motor function of the lower extremities.  She continues to ambulate with a walker and is able to perform her routine daily activities. While at home, the patient is taken care of by her granddaughter and also her daughter. Otherwise, the patient lives independently by herself. She reports her appetite to be decreased since the onset of the pain. She reports having lost 20 pounds. She denies any debilitating symptoms of chest pain, shortness of breath, coughing or wheezing or hemoptysis, or of chills, fever or rigors. She denies any symptoms of autonomic dysfunction of her bowel or bladder activities. She denies any dysuria, hematuria or polyuria. She is a diabetic and has good control of her blood sugar which runs in the 110s to 120 range on a daily basis. Past medical history, past surgical history and family history as per H and P of 05/18/17 unchanged in [place]. Medications: Tylenol, Cymbalta, Humalog insulin, losartan, midodrine, propranolol, tramadol. Allergies:  Ciprofloxacin, codeine, Dilaudid, mysoline, oxycodone. PHYSICAL EXAMINATION: The patient was in somewhat mild to moderate distress on account of the pain in the low back region. Neurologically, otherwise, grossly intact. The patient demonstrated exquisite tenderness just to the right of the midline at L4 and L5 levels. ASSESSMENT AND PLAN: The immediate post balloon kyphoplasty radiograms were reviewed with the patient and her daughter. Given the  patient's above history, and also her history of fall the second day after the procedure and clinical examination, it was suggested to the patient that further evaluation to evaluate for the possibility of a new fracture would require an MRI of the lumbosacral spine. The patient reports no history of claustrophobia. Both the patient and her daughter are in agreement to proceeding with an MRI of the lumbosacral spine. In the meantime, she has been advised to continue using a walker all the time, and also to avoid stooping, bending or lifting anything above 10 pounds. Questions were answered to their satisfaction. Further recommendations to depend on the findings of the MRI scan of the lumbosacral spine. They both leave with good understanding and agreement with the above management plan. Electronically Signed   By: Luanne Bras M.D.   On: 06/15/2017 09:03    Labs:  CBC:  Recent Labs  05/15/17 0529 05/17/17 1227 05/19/17 0650 07/01/17 0715  WBC 9.6 7.4 7.5 10.1  HGB 12.0 12.9 12.3 13.2  HCT 37.3 39.3 38.2 39.3  PLT 211 218 202 230    COAGS:  Recent Labs  05/17/17 1135 05/17/17 1227  INR 1.06  --   APTT  --  33    BMP:  Recent Labs  05/14/17 1646 05/17/17 1227 05/18/17 0619 05/19/17 0650  NA 140 137 139 139  K 3.8 5.4* 3.6 3.7  CL 102 103 108 108  CO2 29 26 23 26   GLUCOSE 45* 197* 191* 164*  BUN 22* 18 18 13   CALCIUM 9.8 9.0 8.7* 9.0  CREATININE 0.85 0.90 0.92 0.85  GFRNONAA >60 58* 56* >60  GFRAA >60 >60 >60 >60    LIVER FUNCTION TESTS:  Recent Labs  01/04/17 1353 01/06/17 0412 05/14/17 1646  BILITOT 0.7 0.4 0.7  AST 31 24 18   ALT 22 18 12*  ALKPHOS 56 51 89  PROT 7.0 5.8* 7.9  ALBUMIN 3.8 3.1* 4.1    TUMOR MARKERS: No results for input(s): AFPTM, CEA, CA199, CHROMGRNA in the last 8760 hours.  Assessment and Plan:  Acute mild T12 inferior endplate compression fracture without significant height loss.  History of KP at L1 with good  result.  Will proceed  with image guided kyphoplasty of T12 today by Dr. Pascal Lux.  Risks and benefits of kyphoplasty were discussed with the patient including, but not limited to education regarding the natural healing process of compression fractures without intervention, bleeding, infection, cement migration which may cause spinal cord damage, paralysis, pulmonary embolism or even death.  This interventional procedure involves the use of X-rays and because of the nature of the planned procedure, it is possible that we will have prolonged use of X-ray fluoroscopy.  Potential radiation risks to you include (but are not limited to) the following: - A slightly elevated risk for cancer  several years later in life. This risk is typically less than 0.5% percent. This risk is low in comparison to the normal incidence of human cancer, which is 33% for women and 50% for men according to the [facility]. - Radiation induced injury can include skin redness, resembling a rash, tissue breakdown / ulcers and hair loss (which can be temporary or permanent).   The likelihood of either of these occurring depends on the difficulty of the procedure and whether you are sensitive to radiation due to previous procedures, disease, or genetic conditions.   IF your procedure requires a prolonged use of radiation, you will be notified and given written instructions for further action.  It is your responsibility to monitor the irradiated area for the 2 weeks following the procedure and to notify your physician if you are concerned that you have suffered a radiation induced injury.    All of the patient's questions were answered, patient is agreeable to proceed.  Consent signed and in chart.   Electronically Signed: Murrell Redden, PA-C 07/01/2017, 7:36 AM   I spent a total of  25 Minutes in face to face in clinical consultation, greater than 50% of which was counseling/coordinating care for  kyphoplasty.

## 2017-07-01 NOTE — Procedures (Signed)
Pre procedural Dx: Symptomatic compression fracture Post procedural Dx: Same  Technically successful cement augmentation of the T12 vertebral body.  Patient is to lie flat for 3 hours.  EBL: Minimal Complications: None immediate.  Jay Skipper Dacosta, MD Pager #: [phone]   

## 2017-07-01 NOTE — Discharge Instructions (Addendum)
KYPHOPLASTY/VERTEBROPLASTY DISCHARGE INSTRUCTIONS  Medications: (check all that apply)     Resume all home medications as before procedure.                    Continue your pain medications as prescribed as needed.  Over the next 3-5 days, decrease your pain medication as tolerated.  Over the counter medications (i.e. Tylenol, ibuprofen, and aleve) may be substituted once severe/moderate pain symptoms have subsided.   Wound Care: - Bandages may be removed the day following your procedure.  You may get your incision wet once bandages are removed.  Bandaids may be used to cover the incisions until scab formation.  Topical ointments are optional.  - If you develop a fever greater than 101 degrees, have increased skin redness at the incision sites or pus-like oozing from incisions occurring within 1 week of the procedure, contact radiology at [phone] or [phone].  - Ice pack to back for 15-20 minutes 2-3 time per day for first 2-3 days post procedure.  The ice will expedite muscle healing and help with the pain from the incisions.   Activity: - Bedrest today with limited activity for 24 hours post procedure.  - No driving for 48 hours.  - Increase your activity as tolerated after bedrest (with assistance if necessary).  - Refrain from any strenuous activity or heavy lifting (greater than 10 lbs.).   Follow up: - Contact radiology at [phone] or [phone] if any questions/concerns.  - A physician assistant from radiology will contact you in approximately 1 week.  - If a biopsy was performed at the time of your procedure, your referring physician should receive the results in usually 2-3 days.        Moderate Conscious Sedation, Adult, Care After These instructions provide you with information about caring for yourself after your procedure. Your health care provider may also give you more specific instructions. Your treatment has been planned according to current medical practices,  but problems sometimes occur. Call your health care provider if you have any problems or questions after your procedure. What can I expect after the procedure? After your procedure, it is common:  To feel sleepy for several hours.  To feel clumsy and have poor balance for several hours.  To have poor judgment for several hours.  To vomit if you eat too soon.  Follow these instructions at home: For at least 24 hours after the procedure:   Do not: ? Participate in activities where you could fall or become injured. ? Drive. ? Use heavy machinery. ? Drink alcohol. ? Take sleeping pills or medicines that cause drowsiness. ? Make important decisions or sign legal documents. ? Take care of children on your own.  Rest. Eating and drinking  Follow the diet recommended by your health care provider.  If you vomit: ? Drink water, juice, or soup when you can drink without vomiting. ? Make sure you have little or no nausea before eating solid foods. General instructions  Have a responsible adult stay with you until you are awake and alert.  Take over-the-counter and prescription medicines only as told by your health care provider.  If you smoke, do not smoke without supervision.  Keep all follow-up visits as told by your health care provider. This is important. Contact a health care provider if:  You keep feeling nauseous or you keep vomiting.  You feel light-headed.  You develop a rash.  You have a fever. Get help right away if:  You have trouble breathing. This information is not intended to replace advice given to you by your health care provider. Make sure you discuss any questions you have with your health care provider. Document Released: 06/14/2013 Document Revised: 01/27/2016 Document Reviewed: 12/14/2015 Elsevier Interactive Patient Education  [employer].

## 2017-07-01 NOTE — Sedation Documentation (Signed)
Patient is resting comfortably. 

## 2017-07-07 ENCOUNTER — Other Ambulatory Visit (HOSPITAL_COMMUNITY): Payer: Self-pay | Admitting: Interventional Radiology

## 2017-07-07 DIAGNOSIS — S22000A Wedge compression fracture of unspecified thoracic vertebra, initial encounter for closed fracture: Secondary | ICD-10-CM

## 2017-07-14 ENCOUNTER — Telehealth: Payer: Self-pay | Admitting: Neurology

## 2017-07-14 NOTE — Telephone Encounter (Signed)
I got a call from Dr. Dahlia Bailiff, patient continues to have problems with orthostasis, she has hypertension at times as well.  She will consider coming down on the Cozaar to 25 mg daily, I will get the patient set up for an evaluation.  In the past, propranolol reduction has resulted in significant increase in heart rate.  The patient may benefit from other measures such as Mestinon.  The patient apparently does not want to use compression stockings.

## 2017-07-14 NOTE — Telephone Encounter (Signed)
Called and LVM for pt on home number to call and schedule appt. Can offer 07/27/17 at 730am if she calls.  Tried mobile but VM full, unable to LVM.

## 2017-07-16 NOTE — Telephone Encounter (Signed)
Called and LVM for pt again to call and schedule f/u. Gave [facility] phone number. Advised office now closed, we open Monday at [place]

## 2017-07-20 NOTE — Telephone Encounter (Signed)
LVM on home number again for pt to call office. Was calling to set up follow up with Dr Jannifer Franklin. Tried mobile again, but went to VM, VM full, unable to leave message.

## 2017-07-20 NOTE — Telephone Encounter (Signed)
Pt called office back. Could not do 730am appt. Scheduled appt for 08/06/17 at 12pm, check in 1130am.

## 2017-08-06 ENCOUNTER — Encounter: Payer: Self-pay | Admitting: Neurology

## 2017-08-06 ENCOUNTER — Ambulatory Visit: Payer: Medicare Other | Admitting: Neurology

## 2017-08-06 VITALS — BP 191/86 | HR 70 | Ht 68.0 in | Wt 214.5 lb

## 2017-08-06 DIAGNOSIS — G6289 Other specified polyneuropathies: Secondary | ICD-10-CM | POA: Diagnosis not present

## 2017-08-06 DIAGNOSIS — G250 Essential tremor: Secondary | ICD-10-CM

## 2017-08-06 DIAGNOSIS — I951 Orthostatic hypotension: Secondary | ICD-10-CM | POA: Diagnosis not present

## 2017-08-06 DIAGNOSIS — E538 Deficiency of other specified B group vitamins: Secondary | ICD-10-CM

## 2017-08-06 DIAGNOSIS — R413 Other amnesia: Secondary | ICD-10-CM | POA: Insufficient documentation

## 2017-08-06 HISTORY — DX: Other amnesia: R41.3

## 2017-08-06 NOTE — Progress Notes (Signed)
Reason for visit: Essential tremor, orthostatic hypotension  [name] is an 83 y.o. female  History of present illness:  Ms. [name] is an 83 year old right-handed white female with a history of an essential tremor and orthostatic hypotension.  The patient has diabetes and likely has an autonomic neuropathy.  The patient is on midodrine, her blood pressure medication has been reduced.  The patient is keeping blood pressure checks regularly, she is noting that her blood pressures are running in the 841 systolic range frequently.  The patient has not had any further falls or blackout episodes.  The patient is being careful when she stands up to get her bearings first and then start walking.  The patient uses a walker for ambulation.  She has gotten kyphoplasty for her compression fractures of the spine.  The patient denies any significant dizziness with standing.  She continues to have tremors, she remains on propranolol taking 20 mg twice daily.  The patient takes midodrine as well.  She comes in reporting a 1 year history of memory problems that are getting worse gradually over time.  The patient is having difficulty with short-term memory and memory for names of people.  The patient is concerned about this issue.  Her brother has Alzheimer's disease.  Past Medical History:  Diagnosis Date  . Anemia    as a teenager  . Arthritis   . Cancer ([place])    skin  . Cholelithiasis   . Chronic insomnia   . Chronic low back pain   . Complication of anesthesia   . DDD (degenerative disc disease), lumbar   . Diabetes mellitus    Iddm x 12 years  . Diabetic peripheral neuropathy ([place])   . Essential tremor 04/01/2015  . GERD (gastroesophageal reflux disease)   . Hx of acute renal failure 02/2014   admitted to [facility]  . Hx: UTI (urinary tract infection) 02/2014  . Hyperlipemia   . Hypertension   . Obesity   . Peripheral edema   . Pneumonia    as a child  . PONV (postoperative nausea and  vomiting)    pt states Zofran not that helpful in the past, Phenergan works best for her  . Staph infection    1970's  . Umbilical hernia   . Wrist fracture    left October 2014    Past Surgical History:  Procedure Laterality Date  . ABDOMINAL HYSTERECTOMY    . BACK SURGERY  2005  . CARDIAC CATHETERIZATION  2000   Dr [name]  . CATARACT EXTRACTION Bilateral    w/ lens implants  . COLONOSCOPY  >10 years  . EYE SURGERY    . HERNIA REPAIR    . IR KYPHO LUMBAR INC FX REDUCE BONE BX UNI/BIL CANNULATION INC/IMAGING  05/18/2017  . IR KYPHO THORACIC WITH BONE BIOPSY  07/01/2017  . IR RADIOLOGIST EVAL & MGMT  06/14/2017  . LUMBAR LAMINECTOMY/DECOMPRESSION MICRODISCECTOMY Left 03/16/2014   Procedure: LUMBAR LAMINECTOMY/DECOMPRESSION MICRODISCECTOMY 1 LEVEL  lumbar level three/four;  Surgeon: [name], MD;  Location: [place];  Service: Neurosurgery;  Laterality: Left;  . LUMBAR LAMINECTOMY/DECOMPRESSION MICRODISCECTOMY Right 02/06/2015   Procedure: Right L4-5 Microdiskectomy;  Surgeon: Leeroy Cha, MD;  Location: [place] [facility];  Service: Neurosurgery;  Laterality: Right;  Right L4-5 Microdiskectomy  . NECK SURGERY    . OOPHORECTOMY    . TOTAL KNEE ARTHROPLASTY Right     Family History  Problem Relation Age of Onset  . Cancer Brother  prostate  . Heart disease Brother   . Alzheimer's disease Brother   . Stroke Mother   . Hypertension Mother   . COPD Brother   . Cancer Brother        Prostate cancer  . Hypertension Sister        Congestive heart failure  . Heart attack Son     Social history:  reports that  has never smoked. she has never used smokeless tobacco. She reports that she does not drink alcohol or use drugs.    Allergies  Allergen Reactions  . Ciprofloxacin Other (See Comments)    Acute kidney failure  . Codeine Nausea And Vomiting  . Dilaudid [Hydromorphone Hcl] Nausea And Vomiting and Other (See Comments)    Severe sweating  . Mysoline [Primidone]  Nausea Only  . Oxycodone Nausea And Vomiting    Medications:  Prior to Admission medications   Medication Sig Start Date End Date Taking? Authorizing Provider  acetaminophen (TYLENOL) 325 MG tablet Take 2 tablets (650 mg total) by mouth every 6 (six) hours as needed for mild pain (or Fever >/= 101). 05/19/17  Yes Hosie Poisson, MD  celecoxib (CELEBREX) 200 MG capsule Take 200 mg by mouth daily.   Yes [provider]  DULoxetine (CYMBALTA) 20 MG capsule Take 1 capsule (20 mg total) by mouth 2 (two) times daily. 06/15/17  Yes Kathrynn Ducking, MD  insulin lispro (HUMALOG) 100 UNIT/ML injection Inject 15-50 Units into the skin 2 (two) times daily. 50/15   Yes [provider]  Liniments (SALONPAS PAIN RELIEF PATCH EX) Place 1 patch onto the skin daily as needed.   Yes [provider]  losartan (COZAAR) 50 MG tablet Take 50 mg by mouth daily. 04/28/17  Yes [provider]  Menthol, Topical Analgesic, (BLUE-EMU MAXIMUM STRENGTH) 2.5 % LIQD Apply 1 application topically 2 (two) times daily as needed.   Yes [provider]  midodrine (PROAMATINE) 5 MG tablet Take 10 mg by mouth 2 (two) times daily.  05/03/17  Yes [provider]  propranolol (INDERAL) 10 MG tablet Take 20 mg by mouth 2 (two) times daily.  03/18/17  Yes [provider]  traMADol (ULTRAM) 50 MG tablet Take 1 tablet (50 mg total) by mouth 2 (two) times daily. 05/19/17  Yes Hosie Poisson, MD    ROS:  Out of a complete 14 system review of symptoms, the patient complains only of the following symptoms, and all other reviewed systems are negative.  Decreased appetite, excessive sweating Runny nose Heat intolerance Incontinence of the bladder Back pain Memory loss, tremors Confusion  Blood pressure (!) 191/86, pulse 70, height 5\' 8"  (1.727 m), weight 214 lb 8 oz (97.3 kg).   Blood pressure, right arm, sitting is 158/78.  Blood pressure, right arm, standing is  108/60.  Physical Exam  General: The patient is alert and cooperative at the time of the examination.  The patient is moderately to markedly obese.  Skin: 2+ edema below the knees is seen bilaterally.   Neurologic Exam  Mental status: The patient is alert and oriented x 3 at the time of the examination. The patient has apparent normal recent and remote memory, with an apparently normal attention span and concentration ability.  Mini-Mental status examination done today shows a total score 28.  The patient is able to name 7 four legged animals in 30 seconds.   Cranial nerves: Facial symmetry is present. Speech is normal, no aphasia or dysarthria is noted.  Extraocular movements are full. Visual fields are full.  Motor: The patient has good strength in all 4 extremities.  Sensory examination: Soft touch sensation is symmetric on the face, arms, and legs.  Coordination: The patient has good finger-nose-finger and heel-to-shin bilaterally.  The patient has bilateral intention tremors with arms with finger-nose-finger.  Gait and station: The patient has a wide-based gait, the patient can walk independently, usually uses a walker.  Tandem gait was not attempted.  Romberg is negative. No drift is seen.  Reflexes: Deep tendon reflexes are symmetric, but are depressed.   Assessment/Plan:  1.  Memory disturbance  2.  Essential tremor  3.  Orthostatic hypotension  4.  Gait disorder  The patient fortunately has not had any further falls.  She still drops about 50 points of systolic blood pressure from sitting to standing.  The patient is being safe however, with her walking.  The patient will have blood work done today to evaluate the memory, she did have a CT scan of the brain recently.  The patient will be placed on Aricept at some point in the future, she does not wish to go on medications at this time.  The patient will follow-up for her next scheduled visit in April 2019.  Jill Alexanders MD 08/06/2017 12:24 PM  [facility] [address] [place], [place] [address]  Phone [phone] Fax [phone]

## 2017-08-07 LAB — SEDIMENTATION RATE: Sed Rate: 18 mm/hr (ref 0–40)

## 2017-08-07 LAB — VITAMIN B12: Vitamin B-12: 295 pg/mL (ref 232–1245)

## 2017-08-07 LAB — RPR: RPR Ser Ql: NONREACTIVE

## 2017-08-09 ENCOUNTER — Telehealth: Payer: Self-pay | Admitting: *Deleted

## 2017-08-09 NOTE — Telephone Encounter (Signed)
-----   Message from Kathrynn Ducking, MD sent at 08/07/2017  2:14 PM EST -----   The blood work results are unremarkable. Please call the patient.  ----- Message ----- From: Lavone Neri Lab Results In Sent: 08/07/2017   7:41 AM To: Kathrynn Ducking, MD

## 2017-08-09 NOTE — Telephone Encounter (Signed)
Called and relayed unremarkable labs per CW,MD note. She verbalized understanding.

## 2017-08-17 ENCOUNTER — Other Ambulatory Visit: Payer: Medicare Other

## 2017-09-09 ENCOUNTER — Encounter: Payer: Self-pay | Admitting: *Deleted

## 2017-09-09 ENCOUNTER — Ambulatory Visit
Admission: RE | Admit: 2017-09-09 | Discharge: 2017-09-09 | Disposition: A | Discharge status: Home / Self Care | Payer: Medicare Other | Source: Ambulatory Visit | Attending: Interventional Radiology | Admitting: Interventional Radiology

## 2017-09-09 DIAGNOSIS — S22000A Wedge compression fracture of unspecified thoracic vertebra, initial encounter for closed fracture: Secondary | ICD-10-CM

## 2017-09-09 HISTORY — PX: IR RADIOLOGIST EVAL & MGMT: IMG5224

## 2017-09-09 NOTE — Progress Notes (Signed)
Patient ID: [name], female   DOB: 01-20-1933, 84 y.o.   MRN: [id number]         Chief Complaint: Patient was seen in consultation today for  Chief Complaint  Patient presents with  . Follow-up    follow up Kyphoplasty    Referring Physician(s): Deveshwar  History of Present Illness: [name] is a 84 y.o. female with past medical history significant for hypertension, hyperlipidemia, diabetes and memory disorder was initially seen in consultation by Dr. Estanislado Pandy 06/16/2017 for recurrent back pain following a technically successful fluoroscopic guided L1 kyphoplasty procedure performed on 05/18/2017.  Unfortunately, the night following the L1 kyphoplasty, the patient suffered a fall with acute worsening of her back pain. Subsequent lumbar spine MRI performed 06/23/2017 demonstrated a mild compression deformity involving the anterior inferior endplate of I62. For this, the patient presented for attempted kyphoplasty on 07/01/2017. Note, Dr. Estanislado Pandy was planning on performing the procedure however had to leave town due to a death in his family and as such the patient was agreeable to have myself perform the procedure.  Patient presents today for post procedural evaluation and management. She is accompanied by her daughter though serves as her own historian.  Patient states that she experienced near complete relief of her back pain following the kyphoplasty procedure.  Patient continues to report chronic lower back pain which extends to involve her right hip, however this is been present for at least the past 4 years.  Note, patient has undergone previous lumbar spine surgery.  Patient is otherwise without complaint. No fever or chills. No lower extremity weakness or paresthesias.  Past Medical History:  Diagnosis Date  . Anemia    as a teenager  . Arthritis   . Cancer ([place])    skin  . Cholelithiasis   . Chronic insomnia   . Chronic low back pain   . Complication of  anesthesia   . DDD (degenerative disc disease), lumbar   . Diabetes mellitus    Iddm x 12 years  . Diabetic peripheral neuropathy ([place])   . Essential tremor 04/01/2015  . GERD (gastroesophageal reflux disease)   . Hx of acute renal failure 02/2014   admitted to [facility]  . Hx: UTI (urinary tract infection) 02/2014  . Hyperlipemia   . Hypertension   . Memory disorder 08/06/2017  . Obesity   . Peripheral edema   . Pneumonia    as a child  . PONV (postoperative nausea and vomiting)    pt states Zofran not that helpful in the past, Phenergan works best for her  . Staph infection    1970's  . Umbilical hernia   . Wrist fracture    left October 2014    Past Surgical History:  Procedure Laterality Date  . ABDOMINAL HYSTERECTOMY    . BACK SURGERY  2005  . CARDIAC CATHETERIZATION  2000   Dr Einar Gip  . CATARACT EXTRACTION Bilateral    w/ lens implants  . COLONOSCOPY  >10 years  . EYE SURGERY    . HERNIA REPAIR    . IR KYPHO LUMBAR INC FX REDUCE BONE BX UNI/BIL CANNULATION INC/IMAGING  05/18/2017  . IR KYPHO THORACIC WITH BONE BIOPSY  07/01/2017  . IR RADIOLOGIST EVAL & MGMT  06/14/2017  . LUMBAR LAMINECTOMY/DECOMPRESSION MICRODISCECTOMY Left 03/16/2014   Procedure: LUMBAR LAMINECTOMY/DECOMPRESSION MICRODISCECTOMY 1 LEVEL  lumbar level three/four;  Surgeon: [name], MD;  Location: [place];  Service: Neurosurgery;  Laterality: Left;  .  LUMBAR LAMINECTOMY/DECOMPRESSION MICRODISCECTOMY Right 02/06/2015   Procedure: Right L4-5 Microdiskectomy;  Surgeon: Leeroy Cha, MD;  Location: [place] [facility];  Service: Neurosurgery;  Laterality: Right;  Right L4-5 Microdiskectomy  . NECK SURGERY    . OOPHORECTOMY    . TOTAL KNEE ARTHROPLASTY Right     Allergies: Ciprofloxacin; Codeine; Dilaudid [hydromorphone hcl]; Mysoline [primidone]; and Oxycodone  Medications: Prior to Admission medications   Medication Sig Start Date End Date Taking? Authorizing Provider  acetaminophen (TYLENOL) 325  MG tablet Take 2 tablets (650 mg total) by mouth every 6 (six) hours as needed for mild pain (or Fever >/= 101). 05/19/17  Yes Hosie Poisson, MD  DULoxetine (CYMBALTA) 20 MG capsule Take 1 capsule (20 mg total) by mouth 2 (two) times daily. 06/15/17  Yes Kathrynn Ducking, MD  insulin lispro (HUMALOG) 100 UNIT/ML injection Inject 15-50 Units into the skin 2 (two) times daily. 50/15   Yes [provider]  losartan (COZAAR) 50 MG tablet Take 25 mg by mouth daily.  04/28/17  Yes [provider]  midodrine (PROAMATINE) 5 MG tablet Take 10 mg by mouth 2 (two) times daily.  05/03/17  Yes [provider]  propranolol (INDERAL) 10 MG tablet Take 20 mg by mouth 2 (two) times daily.  03/18/17  Yes [provider]  celecoxib (CELEBREX) 200 MG capsule Take 200 mg by mouth daily.    [provider]  Liniments (SALONPAS PAIN RELIEF PATCH EX) Place 1 patch onto the skin daily as needed.    [provider]  Menthol, Topical Analgesic, (BLUE-EMU MAXIMUM STRENGTH) 2.5 % LIQD Apply 1 application topically 2 (two) times daily as needed.    [provider]  traMADol (ULTRAM) 50 MG tablet Take 1 tablet (50 mg total) by mouth 2 (two) times daily. Patient not taking: Reported on 09/09/2017 05/19/17   Hosie Poisson, MD     Family History  Problem Relation Age of Onset  . Cancer Brother        prostate  . Heart disease Brother   . Alzheimer's disease Brother   . Stroke Mother   . Hypertension Mother   . COPD Brother   . Cancer Brother        Prostate cancer  . Hypertension Sister        Congestive heart failure  . Heart attack Son     Social History   Socioeconomic History  . Marital status: Widowed    Spouse name: Not on file  . Number of children: 3  . Years of education: 27  . Highest education level: Not on file  Social Needs  . Financial resource strain: Not on file  . Food insecurity - worry: Not on file  . Food insecurity - inability: Not on  file  . Transportation needs - medical: Not on file  . Transportation needs - non-medical: Not on file  Occupational History  . Occupation: Retired   Tobacco Use  . Smoking status: Never Smoker  . Smokeless tobacco: Never Used  Substance and Sexual Activity  . Alcohol use: No  . Drug use: No  . Sexual activity: No  Other Topics Concern  . Not on file  Social History Narrative   Patient lives at home alone.    Patient is widowed.    Patient has 3 children and 2 step children.    Patient is right handed.    Patient has a high school education and CNA certificate.     Patient is  retired.    Patient does not drink caffeine.    ECOG Status: 1 - Symptomatic but completely ambulatory  Review of Systems: A 12 point ROS discussed and pertinent positives are indicated in the HPI above.  All other systems are negative.  Review of Systems  Constitutional: Negative for activity change, appetite change and fatigue.  Respiratory: Negative.   Cardiovascular: Negative.   Musculoskeletal: Positive for back pain.    Vital Signs: BP (!) 160/67   Pulse 92   Temp 98.2 F (36.8 C) (Oral)   Resp 16   Ht 5\' 9"  (1.753 m)   Wt 214 lb (97.1 kg)   SpO2 99%   BMI 31.60 kg/m   Physical Exam   Imaging:  Selected images from lumbar spine MRI performed 03/23/2017 and intraprocedural images from T12 kyphoplasty performed 07/01/2017 were reviewed in detail with the patient and the patient's daughter.   Labs:  CBC: Recent Labs    05/15/17 0529 05/17/17 1227 05/19/17 0650 07/01/17 0715  WBC 9.6 7.4 7.5 10.1  HGB 12.0 12.9 12.3 13.2  HCT 37.3 39.3 38.2 39.3  PLT 211 218 202 230    COAGS: Recent Labs    05/17/17 1135 05/17/17 1227 07/01/17 0715  INR 1.06  --  1.13  APTT  --  33 29    BMP: Recent Labs    05/17/17 1227 05/18/17 0619 05/19/17 0650 07/01/17 0715  NA 137 139 139 141  K 5.4* 3.6 3.7 4.0  CL 103 108 108 102  CO2 26 23 26 27   GLUCOSE 197* 191* 164* 99  BUN  18 18 13  22*  CALCIUM 9.0 8.7* 9.0 9.2  CREATININE 0.90 0.92 0.85 1.11*  GFRNONAA 58* 56* >60 45*  GFRAA >60 >60 >60 52*    LIVER FUNCTION TESTS: Recent Labs    01/04/17 1353 01/06/17 0412 05/14/17 1646  BILITOT 0.7 0.4 0.7  AST 31 24 18   ALT 22 18 12*  ALKPHOS 56 51 89  PROT 7.0 5.8* 7.9  ALBUMIN 3.8 3.1* 4.1    TUMOR MARKERS: No results for input(s): AFPTM, CEA, CA199, CHROMGRNA in the last 8760 hours.  Assessment and Plan:  ATLEIGH GRUEN is a 84 y.o. female with past medical history significant for hypertension, hyperlipidemia, diabetes and memory disorder was initially seen in consultation by Dr. Estanislado Pandy 06/16/2017 for recurrent back pain following a technically successful fluoroscopic guided L1 kyphoplasty procedure performed on 05/18/2017.  Note, Dr. Estanislado Pandy was planning on performing the procedure however had to leave town due to a death in his family and as such the patient was agreeable to have myself perform the procedure.  Selected images from lumbar spine MRI performed 03/23/2017 and intraprocedural images from T12 kyphoplasty performed 07/01/2017 were reviewed in detail with the patient and the patient's daughter.  Patient states that she experienced near complete relief of her back pain following the kyphoplasty procedure.  I explained that as the patient has suffered two previous compression fractures, she is at risk for additional compression fractures and should not hesitate to call the interventional radiology clinic were Dr. Estanislado Pandy if she were to experience acute onset of similar back pain as this could herald another compression fracture.   The patient and the patient's daughter demonstrated excellent understanding of this discussion.  Patient was encouraged to call the interventional radiology clinic with any future questions or concerns him a otherwise follow-up on a when necessary basis.  A copy of this report was sent to the requesting  provider on  this date.  Electronically Signed: Sandi Mariscal 09/09/2017, 4:33 PM   I spent a total of 15 Minutes in face to face in clinical consultation, greater than 50% of which was counseling/coordinating care for post T12 KP.

## 2017-12-16 ENCOUNTER — Ambulatory Visit: Payer: Medicare Other | Admitting: Neurology

## 2017-12-18 ENCOUNTER — Other Ambulatory Visit: Payer: Self-pay | Admitting: Neurology

## 2017-12-22 ENCOUNTER — Ambulatory Visit: Payer: Medicare Other | Admitting: Neurology

## 2017-12-22 ENCOUNTER — Encounter: Payer: Self-pay | Admitting: Neurology

## 2017-12-22 VITALS — BP 201/95 | HR 75 | Ht 69.0 in | Wt 223.5 lb

## 2017-12-22 DIAGNOSIS — G250 Essential tremor: Secondary | ICD-10-CM

## 2017-12-22 DIAGNOSIS — R413 Other amnesia: Secondary | ICD-10-CM

## 2017-12-22 DIAGNOSIS — I951 Orthostatic hypotension: Secondary | ICD-10-CM

## 2017-12-22 NOTE — Progress Notes (Signed)
Reason for visit: Essential tremor, memory disturbance, orthostatic hypotension  [name] is an 84 y.o. female  History of present illness:  [name] is an 84 year old right-handed white female with a history of an essential tremor.  The patient is having ongoing difficulty with the tremor, she finds it difficult to perform handwriting and to feed herself.  The patient does have some vocal tremor as well.  The patient has had orthostatic hypotension, she is on midodrine taking 5 mg twice daily.  She has not had any blackouts recently, she denies any dizziness with standing.  She has almost fallen on 2 occasions, she uses a walker for ambulation.  The patient has not had any significant change in her memory, she has good days and bad days with this, sometimes she cannot remember the name for someone.  She has not wanted to go on any medications for memory in the past.  The patient is on propranolol taking 20 mg twice daily for the tremor.  She seems to tolerate this fairly well.  She returns for an evaluation.  She remains on Cymbalta 20 mg twice daily.    Past Medical History:  Diagnosis Date  . Anemia    as a teenager  . Arthritis   . Cancer ([place])    skin  . Cholelithiasis   . Chronic insomnia   . Chronic low back pain   . Complication of anesthesia   . DDD (degenerative disc disease), lumbar   . Diabetes mellitus    Iddm x 12 years  . Diabetic peripheral neuropathy ([place])   . Essential tremor 04/01/2015  . GERD (gastroesophageal reflux disease)   . Hx of acute renal failure 02/2014   admitted to [facility]  . Hx: UTI (urinary tract infection) 02/2014  . Hyperlipemia   . Hypertension   . Memory disorder 08/06/2017  . Obesity   . Peripheral edema   . Pneumonia    as a child  . PONV (postoperative nausea and vomiting)    pt states Zofran not that helpful in the past, Phenergan works best for her  . Staph infection    1970's  . Umbilical hernia   . Wrist fracture    left October 2014    Past Surgical History:  Procedure Laterality Date  . ABDOMINAL HYSTERECTOMY    . BACK SURGERY  2005  . CARDIAC CATHETERIZATION  2000   Dr Einar Gip  . CATARACT EXTRACTION Bilateral    w/ lens implants  . COLONOSCOPY  >10 years  . EYE SURGERY    . HERNIA REPAIR    . IR KYPHO LUMBAR INC FX REDUCE BONE BX UNI/BIL CANNULATION INC/IMAGING  05/18/2017  . IR KYPHO THORACIC WITH BONE BIOPSY  07/01/2017  . IR RADIOLOGIST EVAL & MGMT  06/14/2017  . IR RADIOLOGIST EVAL & MGMT  09/09/2017  . LUMBAR LAMINECTOMY/DECOMPRESSION MICRODISCECTOMY Left 03/16/2014   Procedure: LUMBAR LAMINECTOMY/DECOMPRESSION MICRODISCECTOMY 1 LEVEL  lumbar level three/four;  Surgeon: [name], MD;  Location: [place];  Service: Neurosurgery;  Laterality: Left;  . LUMBAR LAMINECTOMY/DECOMPRESSION MICRODISCECTOMY Right 02/06/2015   Procedure: Right L4-5 Microdiskectomy;  Surgeon: Leeroy Cha, MD;  Location: [place] [facility];  Service: Neurosurgery;  Laterality: Right;  Right L4-5 Microdiskectomy  . NECK SURGERY    . OOPHORECTOMY    . TOTAL KNEE ARTHROPLASTY Right     Family History  Problem Relation Age of Onset  . Cancer Brother        prostate  .  Heart disease Brother   . Alzheimer's disease Brother   . Stroke Mother   . Hypertension Mother   . COPD Brother   . Cancer Brother        Prostate cancer  . Hypertension Sister        Congestive heart failure  . Heart attack Son     Social history:  reports that she has never smoked. She has never used smokeless tobacco. She reports that she does not drink alcohol or use drugs.    Allergies  Allergen Reactions  . Ciprofloxacin Other (See Comments)    Acute kidney failure  . Codeine Nausea And Vomiting  . Dilaudid [Hydromorphone Hcl] Nausea And Vomiting and Other (See Comments)    Severe sweating  . Mysoline [Primidone] Nausea Only  . Oxycodone Nausea And Vomiting    Medications:  Prior to Admission medications   Medication Sig Start Date  End Date Taking? Authorizing Provider  acetaminophen (TYLENOL) 325 MG tablet Take 2 tablets (650 mg total) by mouth every 6 (six) hours as needed for mild pain (or Fever >/= 101). 05/19/17  Yes Hosie Poisson, MD  DULoxetine (CYMBALTA) 20 MG capsule TAKE 1 CAPSULE BY MOUTH TWICE A DAY 12/20/17  Yes Kathrynn Ducking, MD  insulin lispro (HUMALOG) 100 UNIT/ML injection Inject 15-50 Units into the skin 2 (two) times daily. 50/15   Yes [provider]  losartan (COZAAR) 50 MG tablet Take 25 mg by mouth daily.  04/28/17  Yes [provider]  Menthol, Topical Analgesic, (BLUE-EMU MAXIMUM STRENGTH) 2.5 % LIQD Apply 1 application topically 2 (two) times daily as needed.   Yes [provider]  midodrine (PROAMATINE) 5 MG tablet Take 5 mg by mouth 2 (two) times daily. 05/03/17  Yes [provider]  propranolol (INDERAL) 10 MG tablet Take 20 mg by mouth 2 (two) times daily.  03/18/17  Yes [provider]    ROS:  Out of a complete 14 system review of symptoms, the patient complains only of the following symptoms, and all other reviewed systems are negative.  Hearing loss Excessive thirst Incontinence of the bladder, frequency of urination, urinary urgency Back pain, neck stiffness, neck pain Daytime sleepiness Bruising easily Memory loss, tremors  Blood pressure (!) 201/95, pulse 75, height 5\' 9"  (1.753 m), weight 223 lb 8 oz (101.4 kg).    Blood pressure, right arm, sitting is 164/80.  Blood pressure, right arm, standing is 128/64.  Physical Exam  General: The patient is alert and cooperative at the time of the examination.  The patient is moderately to markedly obese.  Skin: No significant peripheral edema is noted.   Neurologic Exam  Mental status: The patient is alert and oriented x 3 at the time of the examination. The patient has apparent normal recent and remote memory, with an apparently normal attention span and concentration ability.   Mini-Mental status examination done today shows a total score 29/30.   Cranial nerves: Facial symmetry is present. Speech is normal, no aphasia or dysarthria is noted. Extraocular movements are full. Visual fields are full.  A mild vocal tremor is noted.  Motor: The patient has good strength in all 4 extremities.  Sensory examination: Soft touch sensation is symmetric on the face, arms, and legs.  Coordination: The patient has good finger-nose-finger and heel-to-shin bilaterally.  Tremors are noted with finger-nose-finger bilaterally, when drawing a spiral there is some tremor translated into handwriting.  Gait and station: The patient has a slightly wide-based  gait, the patient usually uses a walker for ambulation.  Romberg is negative. No drift is seen.  Reflexes: Deep tendon reflexes are symmetric, but are depressed.   Assessment/Plan:  1.  Essential tremor  2.  Orthostatic hypotension  3.  Gait disorder  4.  Mild memory disorder  The patient appears to be doing relatively well at this time.  The tremor is an issue for her, but she does not wish to go on further medication to improve the tremor.  She has not had any blackout episodes, she still drops of blood pressure with standing.  The memory issue remained stable, we will follow this over time.  She will follow-up in about 6 months.  Jill Alexanders MD 12/22/2017 10:21 AM  [facility] [address] [place], [place] [address]  Phone [phone] Fax [phone]

## 2018-04-12 ENCOUNTER — Other Ambulatory Visit: Payer: Self-pay | Admitting: Neurology

## 2018-04-13 ENCOUNTER — Encounter: Payer: Self-pay | Admitting: Endocrinology

## 2018-05-27 ENCOUNTER — Ambulatory Visit (INDEPENDENT_AMBULATORY_CARE_PROVIDER_SITE_OTHER): Payer: Medicare Other | Admitting: Endocrinology

## 2018-05-27 ENCOUNTER — Encounter: Payer: Self-pay | Admitting: Endocrinology

## 2018-05-27 VITALS — BP 164/100 | HR 80 | Ht 69.0 in | Wt 227.6 lb

## 2018-05-27 DIAGNOSIS — E1122 Type 2 diabetes mellitus with diabetic chronic kidney disease: Secondary | ICD-10-CM

## 2018-05-27 DIAGNOSIS — N183 Chronic kidney disease, stage 3 unspecified: Secondary | ICD-10-CM

## 2018-05-27 DIAGNOSIS — Z794 Long term (current) use of insulin: Secondary | ICD-10-CM | POA: Diagnosis not present

## 2018-05-27 LAB — POCT GLYCOSYLATED HEMOGLOBIN (HGB A1C): Hemoglobin A1C: 9.3 % — AB (ref 4.0–5.6)

## 2018-05-27 NOTE — Progress Notes (Signed)
Subjective:    Patient ID: [name], female    DOB: December 07, 1932, 84 y.o.   MRN: [id number]  HPI pt is referred by Dr Radene Ou, for diabetes.  Pt states DM was dx'ed in 2007; she has moderate neuropathy of the lower extremities; she has associated renal failure and autonomic neuropathy; she has been on insulin since 2010; pt says her diet is not good, and exercise is limited by health problems; she has never had GDM, pancreatitis, pancreatic surgery, DKA, or [name] hypoglycemia.  She lives in independent living, and takes her own insulin.  She checks cbg fasting only.  She says it varies from 100's-200's.  Past Medical History:  Diagnosis Date  . Anemia    as a teenager  . Arthritis   . Cancer ([place])    skin  . Cholelithiasis   . Chronic insomnia   . Chronic low back pain   . Complication of anesthesia   . DDD (degenerative disc disease), lumbar   . Diabetes mellitus    Iddm x 12 years  . Diabetic peripheral neuropathy ([place])   . Essential tremor 04/01/2015  . GERD (gastroesophageal reflux disease)   . Hx of acute renal failure 02/2014   admitted to [facility]  . Hx: UTI (urinary tract infection) 02/2014  . Hyperlipemia   . Hypertension   . Memory disorder 08/06/2017  . Obesity   . Peripheral edema   . Pneumonia    as a child  . PONV (postoperative nausea and vomiting)    pt states Zofran not that helpful in the past, Phenergan works best for her  . Staph infection    1970's  . Umbilical hernia   . Wrist fracture    left October 2014    Past Surgical History:  Procedure Laterality Date  . ABDOMINAL HYSTERECTOMY    . BACK SURGERY  2005  . CARDIAC CATHETERIZATION  2000   Dr Einar Gip  . CATARACT EXTRACTION Bilateral    w/ lens implants  . COLONOSCOPY  >10 years  . EYE SURGERY    . HERNIA REPAIR    . IR KYPHO LUMBAR INC FX REDUCE BONE BX UNI/BIL CANNULATION INC/IMAGING  05/18/2017  . IR KYPHO THORACIC WITH BONE BIOPSY  07/01/2017  . IR RADIOLOGIST EVAL & MGMT  06/14/2017   . IR RADIOLOGIST EVAL & MGMT  09/09/2017  . LUMBAR LAMINECTOMY/DECOMPRESSION MICRODISCECTOMY Left 03/16/2014   Procedure: LUMBAR LAMINECTOMY/DECOMPRESSION MICRODISCECTOMY 1 LEVEL  lumbar level three/four;  Surgeon: [name], MD;  Location: [place];  Service: Neurosurgery;  Laterality: Left;  . LUMBAR LAMINECTOMY/DECOMPRESSION MICRODISCECTOMY Right 02/06/2015   Procedure: Right L4-5 Microdiskectomy;  Surgeon: Leeroy Cha, MD;  Location: [place] [facility];  Service: Neurosurgery;  Laterality: Right;  Right L4-5 Microdiskectomy  . NECK SURGERY    . OOPHORECTOMY    . TOTAL KNEE ARTHROPLASTY Right     Social History   Socioeconomic History  . Marital status: Widowed    Spouse name: Not on file  . Number of children: 3  . Years of education: 75  . Highest education level: Not on file  Occupational History  . Occupation: Retired   Scientific laboratory technician  . Financial resource strain: Not on file  . Food insecurity:    Worry: Not on file    Inability: Not on file  . Transportation needs:    Medical: Not on file    Non-medical: Not on file  Tobacco Use  . Smoking status: Never Smoker  .  Smokeless tobacco: Never Used  Substance and Sexual Activity  . Alcohol use: No  . Drug use: No  . Sexual activity: Never  Lifestyle  . Physical activity:    Days per week: Not on file    Minutes per session: Not on file  . Stress: Not on file  Relationships  . Social connections:    Talks on phone: Not on file    Gets together: Not on file    Attends religious service: Not on file    Active member of club or organization: Not on file    Attends meetings of clubs or organizations: Not on file    Relationship status: Not on file  . Intimate partner violence:    Fear of current or ex partner: Not on file    Emotionally abused: Not on file    Physically abused: Not on file    Forced sexual activity: Not on file  Other Topics Concern  . Not on file  Social History Narrative   Patient lives at home  alone.    Patient is widowed.    Patient has 3 children and 2 step children.    Patient is right handed.    Patient has a high school education and CNA certificate.     Patient is retired.    Patient does not drink caffeine.    Current Outpatient Medications on File Prior to Visit  Medication Sig Dispense Refill  . acetaminophen (TYLENOL) 325 MG tablet Take 2 tablets (650 mg total) by mouth every 6 (six) hours as needed for mild pain (or Fever >/= 101).    . DULoxetine (CYMBALTA) 20 MG capsule TAKE 1 CAPSULE BY MOUTH TWICE A DAY 180 capsule 2  . insulin lispro (HUMALOG) 100 UNIT/ML injection Inject 15-50 Units into the skin 2 (two) times daily. 50/15    . losartan (COZAAR) 50 MG tablet Take 25 mg by mouth daily.     . Menthol, Topical Analgesic, (BLUE-EMU MAXIMUM STRENGTH) 2.5 % LIQD Apply 1 application topically 2 (two) times daily as needed.    . midodrine (PROAMATINE) 5 MG tablet Take 5 mg by mouth 2 (two) times daily.    . propranolol (INDERAL) 10 MG tablet Take 20 mg by mouth 2 (two) times daily.      No current facility-administered medications on file prior to visit.     Allergies  Allergen Reactions  . Ciprofloxacin Other (See Comments)    Acute kidney failure  . Codeine Nausea And Vomiting  . Dilaudid [Hydromorphone Hcl] Nausea And Vomiting and Other (See Comments)    [name] sweating  . Hydromorphone   . Mysoline [Primidone] Nausea Only  . Oxycodone Nausea And Vomiting    Family History  Problem Relation Age of Onset  . Cancer Brother        prostate  . Heart disease Brother   . Alzheimer's disease Brother   . Diabetes Brother   . Stroke Mother   . Hypertension Mother   . COPD Brother   . Cancer Brother        Prostate cancer  . Hypertension Sister        Congestive heart failure  . Diabetes Sister   . Heart attack Son     BP (!) 164/100 (BP Location: Left Arm, Patient Position: Sitting)   Pulse 80   Ht 5\' 9"  (1.753 m)   Wt 227 lb 9.6 oz (103.2 kg)    SpO2 97%   BMI 33.61 kg/m  Review of Systems denies weight loss, blurry vision, headache, chest pain, sob, n/v, muscle cramps, excessive diaphoresis, depression, hypoglycemia, and cold intolerance.  No change in chronic postural dizziness, rhinorrhea, easy bruising, urinary frequency, or back pain.       Objective:   Physical Exam VS: see vs page GEN: no distress HEAD: head: no deformity eyes: no periorbital swelling, no proptosis external nose and ears are normal mouth: no lesion seen NECK: supple, thyroid is not enlarged CHEST WALL: no deformity LUNGS: clear to auscultation CV: reg rate and rhythm, no murmur.   ABD: abdomen is soft, nontender.  no hepatosplenomegaly.  not distended.  no hernia.   MUSCULOSKELETAL: muscle bulk and strength are grossly normal.  no obvious joint swelling.  gait is steady, with a walker.   EXTEMITIES: no deformity.  no ulcer on the feet.  feet are of normal temp, but are slightly cyanotic.  1+ bilat leg edema, and bilat vv's.   PULSES: dorsalis pedis intact bilat.  no carotid bruit NEURO:  cn 2-12 grossly intact, except vision is not tested (wearing glasses).   readily moves all 4's.  sensation is intact to touch on the feet, but decreased from normal.  Fine tremor of the hands.   SKIN:  Normal texture and temperature.  No rash or suspicious lesion is visible.  Few ecchymoses of the forearms.   NODES:  None palpable at the neck PSYCH: alert, well-oriented.  Does not appear anxious nor depressed.    Lab Results  Component Value Date   CREATININE 1.11 (H) 07/01/2017   BUN 22 (H) 07/01/2017   NA 141 07/01/2017   K 4.0 07/01/2017   CL 102 07/01/2017   CO2 27 07/01/2017   Lab Results  Component Value Date   HGBA1C 9.3 (A) 05/27/2018   I have reviewed outside records, and summarized: Pt was noted to have elevated a1c, and referred here.  Other probs addressed were HTN and incontinence      Assessment & Plan:  Insulin-requiring type 2 DM,  with renal failure, new to me: We need cbg's in order to safely adjust insulin Memory loss: she is not a candidate for multiple daily injections  Patient Instructions  Your blood pressure is high today.  Please see your primary care provider soon, to have it rechecked good diet and exercise significantly improve the control of your diabetes.  please let me know if you wish to be referred to a dietician.  high blood sugar is very risky to your health.  you should see an eye doctor and dentist every year.  It is very important to get all recommended vaccinations.  Controlling your blood pressure and cholesterol drastically reduces the damage diabetes does to your body.  Those who smoke should quit.  Please discuss these with your doctor.  check your blood sugar twice a day.  vary the time of day when you check, between before the 3 meals, and at bedtime.  also check if you have symptoms of your blood sugar being too high or too low.  please keep a record of the readings and bring it to your next appointment here (or you can bring the meter itself).  You can write it on any piece of paper.  please call us sooner if your blood sugar goes below 70, or if you have a lot of readings over 200. Please continue the same insulin for now. Please come back for a follow-up appointment in 1 month.

## 2018-05-27 NOTE — Patient Instructions (Addendum)
Your blood pressure is high today.  Please see your primary care provider soon, to have it rechecked good diet and exercise significantly improve the control of your diabetes.  please let me know if you wish to be referred to a dietician.  high blood sugar is very risky to your health.  you should see an eye doctor and dentist every year.  It is very important to get all recommended vaccinations.  Controlling your blood pressure and cholesterol drastically reduces the damage diabetes does to your body.  Those who smoke should quit.  Please discuss these with your doctor.  check your blood sugar twice a day.  vary the time of day when you check, between before the 3 meals, and at bedtime.  also check if you have symptoms of your blood sugar being too high or too low.  please keep a record of the readings and bring it to your next appointment here (or you can bring the meter itself).  You can write it on any piece of paper.  please call us sooner if your blood sugar goes below 70, or if you have a lot of readings over 200. Please continue the same insulin for now. Please come back for a follow-up appointment in 1 month.

## 2018-06-01 IMAGING — NM NM PULMONARY VENT & PERF
15 series · 15 of 15 positions shown · non-contrast
Comparison: Chest radiograph from earlier today.

CLINICAL DATA: Tachycardia.  Inpatient.

EXAM:
NUCLEAR MEDICINE VENTILATION - PERFUSION LUNG SCAN
TECHNIQUE: Ventilation images were obtained in multiple projections using
inhaled aerosol Rc-FFm DTPA. Perfusion images were obtained in
multiple projections after intravenous injection of Rc-FFm MAA.
RADIOPHARMACEUTICALS:  31.8 mCi 3echnetium-TTm DTPA aerosol
inhalation and 4.3 mCi 3echnetium-TTm MAA IV

[Series 1: ant/post vent · 4.14mm/px · 1 of 1 slices shown]
[im 1/1]
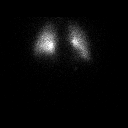

[Series 2: lao/rpo vent · 4.14mm/px · 1 of 1 slices shown (1 of 2)]
[im 1/1]
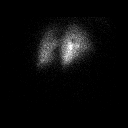

[Series 2: lao/rpo vent · 4.14mm/px · 1 of 1 slices shown (2 of 2)]
[im 1/1]
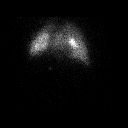

[Series 3: lpo/rao vent · 4.14mm/px · 1 of 1 slices shown (1 of 2)]
[im 1/1]
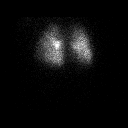

[Series 3: lpo/rao vent · 4.14mm/px · 1 of 1 slices shown (2 of 2)]
[im 1/1]
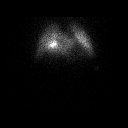

[Series 4: lt lat/rt lat vent · 4.14mm/px · 1 of 1 slices shown (1 of 2)]
[im 1/1]
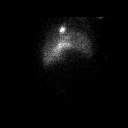

[Series 4: lt lat/rt lat vent · 4.14mm/px · 1 of 1 slices shown (2 of 2)]
[im 1/1]
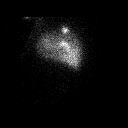

[Series 5: lt lat/rt lat perf · 4.14mm/px · 1 of 1 slices shown (1 of 2)]
[im 1/1]
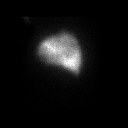

[Series 5: lt lat/rt lat perf · 4.14mm/px · 1 of 1 slices shown (2 of 2)]
[im 1/1]
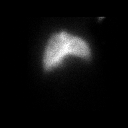

[Series 6: lpo/rao perf · 4.14mm/px · 1 of 1 slices shown (1 of 2)]
[im 1/1]
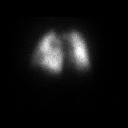

[Series 6: lpo/rao perf · 4.14mm/px · 1 of 1 slices shown (2 of 2)]
[im 1/1]
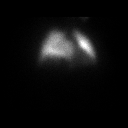

[Series 7: ant/post perf · 4.14mm/px · 1 of 1 slices shown (1 of 2)]
[im 1/1]
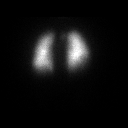

[Series 7: ant/post perf · 4.14mm/px · 1 of 1 slices shown (2 of 2)]
[im 1/1]
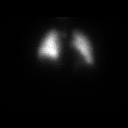

[Series 8: lao/rpo perf · 4.14mm/px · 1 of 1 slices shown (1 of 2)]
[im 1/1]
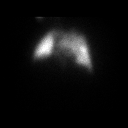

[Series 8: lao/rpo perf · 4.14mm/px · 1 of 1 slices shown (2 of 2)]
[im 1/1]
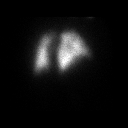

[15 of 15 positions shown; findings below may reference images not displayed]

FINDINGS: Ventilation: No focal ventilation defect.

Perfusion: No wedge shaped peripheral perfusion defects to suggest
acute pulmonary embolism.
IMPRESSION: Normal V/Q scan.

## 2018-06-20 ENCOUNTER — Other Ambulatory Visit: Payer: Self-pay

## 2018-06-20 ENCOUNTER — Emergency Department (HOSPITAL_COMMUNITY): Payer: Medicare Other

## 2018-06-20 ENCOUNTER — Telehealth: Payer: Self-pay | Admitting: Endocrinology

## 2018-06-20 ENCOUNTER — Encounter (HOSPITAL_COMMUNITY): Payer: Self-pay | Admitting: Emergency Medicine

## 2018-06-20 ENCOUNTER — Emergency Department (HOSPITAL_COMMUNITY)
Admission: EM | Admit: 2018-06-20 | Discharge: 2018-06-20 | Disposition: A | Discharge status: Home / Self Care | Payer: Medicare Other | Attending: Emergency Medicine | Admitting: Emergency Medicine

## 2018-06-20 ENCOUNTER — Other Ambulatory Visit (HOSPITAL_COMMUNITY): Payer: Self-pay | Admitting: Interventional Radiology

## 2018-06-20 DIAGNOSIS — Y929 Unspecified place or not applicable: Secondary | ICD-10-CM | POA: Diagnosis not present

## 2018-06-20 DIAGNOSIS — Y999 Unspecified external cause status: Secondary | ICD-10-CM | POA: Insufficient documentation

## 2018-06-20 DIAGNOSIS — E1122 Type 2 diabetes mellitus with diabetic chronic kidney disease: Secondary | ICD-10-CM | POA: Insufficient documentation

## 2018-06-20 DIAGNOSIS — Z79899 Other long term (current) drug therapy: Secondary | ICD-10-CM | POA: Insufficient documentation

## 2018-06-20 DIAGNOSIS — Y939 Activity, unspecified: Secondary | ICD-10-CM | POA: Diagnosis not present

## 2018-06-20 DIAGNOSIS — W19XXXA Unspecified fall, initial encounter: Secondary | ICD-10-CM | POA: Diagnosis not present

## 2018-06-20 DIAGNOSIS — N183 Chronic kidney disease, stage 3 (moderate): Secondary | ICD-10-CM | POA: Diagnosis not present

## 2018-06-20 DIAGNOSIS — I129 Hypertensive chronic kidney disease with stage 1 through stage 4 chronic kidney disease, or unspecified chronic kidney disease: Secondary | ICD-10-CM | POA: Diagnosis not present

## 2018-06-20 DIAGNOSIS — M545 Low back pain, unspecified: Secondary | ICD-10-CM

## 2018-06-20 DIAGNOSIS — Z794 Long term (current) use of insulin: Secondary | ICD-10-CM | POA: Insufficient documentation

## 2018-06-20 DIAGNOSIS — S32000A Wedge compression fracture of unspecified lumbar vertebra, initial encounter for closed fracture: Secondary | ICD-10-CM

## 2018-06-20 DIAGNOSIS — E1140 Type 2 diabetes mellitus with diabetic neuropathy, unspecified: Secondary | ICD-10-CM | POA: Insufficient documentation

## 2018-06-20 DIAGNOSIS — S0990XA Unspecified injury of head, initial encounter: Secondary | ICD-10-CM | POA: Diagnosis present

## 2018-06-20 MED ORDER — DICLOFENAC SODIUM ER 100 MG PO TB24: 100.0000 mg | ORAL_TABLET | Freq: Every day | ORAL | 0 refills | Status: DC

## 2018-06-20 MED ORDER — LIDOCAINE 5 % EX PTCH: 1.0000 | MEDICATED_PATCH | CUTANEOUS | 0 refills | Status: DC

## 2018-06-20 MED ORDER — BD PEN NEEDLE SHORT U/F 31G X 8 MM MISC: 11 refills | Status: DC

## 2018-06-20 MED ORDER — LIDOCAINE 5 % EX PTCH
2.0000 | MEDICATED_PATCH | CUTANEOUS | Status: DC
  Administered 2018-06-20: 2 via TRANSDERMAL
  Filled 2018-06-20: qty 2

## 2018-06-20 NOTE — ED Provider Notes (Signed)
[facility] EMERGENCY DEPARTMENT Provider Note   CSN: [id number] Arrival date & time: 06/20/18  5597     History   Chief Complaint Chief Complaint  Patient presents with  . Fall    HPI BAILY HOVANEC is a 84 y.o. female.  The history is provided by the patient.  Fall  This is a recurrent problem. The current episode started less than 1 hour ago. The problem occurs constantly. The problem has not changed since onset.Pertinent negatives include no chest pain, no abdominal pain, no headaches and no shortness of breath. Nothing aggravates the symptoms. Nothing relieves the symptoms. She has tried nothing for the symptoms. The treatment provided no relief.  Was not able to get her walker into the bathroom and grabbed door frame slipped and hit head and lower back.    Past Medical History:  Diagnosis Date  . Anemia    as a teenager  . Arthritis   . Cancer ([place])    skin  . Cholelithiasis   . Chronic insomnia   . Chronic low back pain   . Complication of anesthesia   . DDD (degenerative disc disease), lumbar   . Diabetes mellitus    Iddm x 12 years  . Diabetic peripheral neuropathy ([place])   . Essential tremor 04/01/2015  . GERD (gastroesophageal reflux disease)   . Hx of acute renal failure 02/2014   admitted to [facility]  . Hx: UTI (urinary tract infection) 02/2014  . Hyperlipemia   . Hypertension   . Memory disorder 08/06/2017  . Obesity   . Peripheral edema   . Pneumonia    as a child  . PONV (postoperative nausea and vomiting)    pt states Zofran not that helpful in the past, Phenergan works best for her  . Staph infection    1970's  . Umbilical hernia   . Wrist fracture    left October 2014    Patient Active Problem List   Diagnosis Date Noted  . Memory disorder 08/06/2017  . Left ventricular diastolic dysfunction with preserved systolic function 41/63/8453  . Closed compression fracture of L1 lumbar vertebra 05/17/2017  . Intractable  back pain 05/17/2017  . Diabetes ([place]) 05/17/2017  . GERD (gastroesophageal reflux disease) 05/17/2017  . Hypertension 05/17/2017  . Hyperlipemia 05/17/2017  . Back pain 05/15/2017  . Syncope 01/05/2017  . Tachycardia 01/04/2017  . CKD (chronic kidney disease) stage 3, GFR 30-59 ml/min (HCC) 01/04/2017  . Diabetes mellitus with complication ([place]) 64/68/0321  . Peripheral neuropathy 01/04/2017  . Orthostatic hypotension 01/04/2017  . Fall 12/24/2016  . Acute encephalopathy 12/24/2016  . Depression 12/24/2016  . Acute renal failure superimposed on stage 3 chronic kidney disease ([place]) 12/24/2016  . Vertigo 05/16/2016  . Chest pain 05/15/2016  . Tremor, essential 04/01/2015  . Chronic low back pain 04/01/2015  . Lumbar herniated disc 02/06/2015  . Lumbar radiculopathy 03/20/2014  . UTI (lower urinary tract infection) 03/18/2014  . Hyperglycemia 03/18/2014  . Dyslipidemia 03/18/2014  . Lumbar stenosis 03/16/2014  . HTN (hypertension) 02/12/2014  . Diabetes mellitus ([place]) 02/12/2014  . Acute renal failure ([place]) 02/11/2014  . H/O total knee replacement 08/11/2013  . Gallstones 10/01/2011    Past Surgical History:  Procedure Laterality Date  . ABDOMINAL HYSTERECTOMY    . BACK SURGERY  2005  . CARDIAC CATHETERIZATION  2000   Dr Einar Gip  . CATARACT EXTRACTION Bilateral    w/ lens implants  . COLONOSCOPY  >10 years  .  EYE SURGERY    . HERNIA REPAIR    . IR KYPHO LUMBAR INC FX REDUCE BONE BX UNI/BIL CANNULATION INC/IMAGING  05/18/2017  . IR KYPHO THORACIC WITH BONE BIOPSY  07/01/2017  . IR RADIOLOGIST EVAL & MGMT  06/14/2017  . IR RADIOLOGIST EVAL & MGMT  09/09/2017  . LUMBAR LAMINECTOMY/DECOMPRESSION MICRODISCECTOMY Left 03/16/2014   Procedure: LUMBAR LAMINECTOMY/DECOMPRESSION MICRODISCECTOMY 1 LEVEL  lumbar level three/four;  Surgeon: [name], MD;  Location: [facility];  Service: Neurosurgery;  Laterality: Left;  . LUMBAR LAMINECTOMY/DECOMPRESSION MICRODISCECTOMY Right 02/06/2015     Procedure: Right L4-5 Microdiskectomy;  Surgeon: Leeroy Cha, MD;  Location: [facility];  Service: Neurosurgery;  Laterality: Right;  Right L4-5 Microdiskectomy  . NECK SURGERY    . OOPHORECTOMY    . TOTAL KNEE ARTHROPLASTY Right      OB History   None      Home Medications    Prior to Admission medications   Medication Sig Start Date End Date Taking? Authorizing Provider  acetaminophen (TYLENOL) 325 MG tablet Take 2 tablets (650 mg total) by mouth every 6 (six) hours as needed for mild pain (or Fever >/= 101). 05/19/17   Hosie Poisson, MD  DULoxetine (CYMBALTA) 20 MG capsule TAKE 1 CAPSULE BY MOUTH TWICE A DAY 04/12/18   Kathrynn Ducking, MD  insulin lispro (HUMALOG) 100 UNIT/ML injection Inject 15-50 Units into the skin 2 (two) times daily. 50/15    [provider]  losartan (COZAAR) 50 MG tablet Take 25 mg by mouth daily.  04/28/17   [provider]  Menthol, Topical Analgesic, (BLUE-EMU MAXIMUM STRENGTH) 2.5 % LIQD Apply 1 application topically 2 (two) times daily as needed.    [provider]  midodrine (PROAMATINE) 5 MG tablet Take 5 mg by mouth 2 (two) times daily. 05/03/17   [provider]  propranolol (INDERAL) 10 MG tablet Take 20 mg by mouth 2 (two) times daily.  03/18/17   [provider]    Family History Family History  Problem Relation Age of Onset  . Cancer Brother        prostate  . Heart disease Brother   . Alzheimer's disease Brother   . Diabetes Brother   . Stroke Mother   . Hypertension Mother   . COPD Brother   . Cancer Brother        Prostate cancer  . Hypertension Sister        Congestive heart failure  . Diabetes Sister   . Heart attack Son     Social History Social History   Tobacco Use  . Smoking status: Never Smoker  . Smokeless tobacco: Never Used  Substance Use Topics  . Alcohol use: No  . Drug use: No     Allergies   Ciprofloxacin; Codeine; Dilaudid [hydromorphone hcl];  Hydromorphone; Mysoline [primidone]; and Oxycodone   Review of Systems Review of Systems  Respiratory: Negative for shortness of breath.   Cardiovascular: Negative for chest pain.  Gastrointestinal: Negative for abdominal pain.  Musculoskeletal: Positive for back pain.  Neurological: Negative for weakness, numbness and headaches.  All other systems reviewed and are negative.    Physical Exam Updated Vital Signs BP (!) 176/82   Pulse 78   Temp 98.2 F (36.8 C) (Oral)   Resp 17   SpO2 98%   Physical Exam  Constitutional: She appears well-developed and well-nourished. No distress.  HENT:  Head: Normocephalic and atraumatic.  Right Ear: External ear normal.  Left  Ear: External ear normal.  Nose: Nose normal.  Mouth/Throat: Oropharynx is clear and moist. No oropharyngeal exudate.  Eyes: Pupils are equal, round, and reactive to light. Conjunctivae are normal.  Neck: Normal range of motion. Neck supple.  Cardiovascular: Normal rate, regular rhythm, normal heart sounds and intact distal pulses.  Pulmonary/Chest: Effort normal and breath sounds normal. No stridor. She has no wheezes. She has no rales.  Abdominal: Soft. Bowel sounds are normal. She exhibits no mass. There is no tenderness. There is no rebound and no guarding.  Musculoskeletal: Normal range of motion. She exhibits no deformity.       Right hip: Normal.       Left hip: Normal.       Right upper leg: Normal.       Left upper leg: Normal.  Neurological: She is alert. She displays normal reflexes.  Skin: Skin is warm and dry. Capillary refill takes less than 2 seconds.  Psychiatric: She has a normal mood and affect.     ED Treatments / Results  Labs (all labs ordered are listed, but only abnormal results are displayed) Results for orders placed or performed in visit on 05/27/18  POCT glycosylated hemoglobin (Hb A1C)  Result Value Ref Range   Hemoglobin A1C 9.3 (A) 4.0 - 5.6 %   HbA1c POC (<> result, manual  entry)     HbA1c, POC (prediabetic range)     HbA1c, POC (controlled diabetic range)     Dg Lumbar Spine Complete  Result Date: 06/20/2018 CLINICAL DATA:  Patient felt dizzy and fell backwards. Hypertension. Low back pain. EXAM: LUMBAR SPINE - COMPLETE 4+ VIEW COMPARISON:  09/20/2017 FINDINGS: Normal alignment of the lumbar spine. Old vertebral compressions at T12 and L1 post kyphoplasty. Diffuse degenerative change with narrowed interspaces and endplate hypertrophic changes. Prominent degenerative changes in the facet joints. No acute vertebral compression or deformity identified. No focal bone lesion or bone destruction. Visualized sacrum appears intact. Calcification in the right upper quadrant likely representing a gallstone. IMPRESSION: Old vertebral compressions at T12 and L1 post kyphoplasty. Degenerative changes. Normal alignment. No acute bony abnormalities. Electronically Signed   By: Lucienne Capers M.D.   On: 06/20/2018 06:22   Ct Head Wo Contrast  Result Date: 06/20/2018 CLINICAL DATA:  Fall backwards with impact to the posterior head. EXAM: CT HEAD WITHOUT CONTRAST CT CERVICAL SPINE WITHOUT CONTRAST TECHNIQUE: Multidetector CT imaging of the head and cervical spine was performed following the standard protocol without intravenous contrast. Multiplanar CT image reconstructions of the cervical spine were also generated. COMPARISON:  CT of the head and cervical spine 05/20/2017 FINDINGS: CT HEAD FINDINGS Brain: There is no mass, hemorrhage or extra-axial collection. The size and configuration of the ventricles and extra-axial CSF spaces are normal. There is no acute or chronic infarction. There is hypoattenuation of the periventricular white matter, most commonly indicating chronic ischemic microangiopathy. Vascular: No abnormal hyperdensity of the major intracranial arteries or dural venous sinuses. No intracranial atherosclerosis. Skull: The visualized skull base, calvarium and extracranial  soft tissues are normal. Sinuses/Orbits: No fluid levels or advanced mucosal thickening of the visualized paranasal sinuses. No mastoid or middle ear effusion. The orbits are normal. CT CERVICAL SPINE FINDINGS Alignment: No static subluxation. Facets are aligned. Occipital condyles are normally positioned. Skull base and vertebrae: No acute fracture. There is ACDF at C4-C7. There is solid osseous fusion anteriorly. Soft tissues and spinal canal: No prevertebral fluid or swelling. No visible canal hematoma. Disc levels: There is  multilevel moderate-to-severe facet arthrosis, right worse than left. No bony spinal canal stenosis. Upper chest: No pneumothorax, pulmonary nodule or pleural effusion. Other: Normal visualized paraspinal cervical soft tissues. IMPRESSION: 1. Chronic ischemic microangiopathy without acute intracranial abnormality. 2. No skull fracture. 3. No fracture or static subluxation of the cervical spine. Electronically Signed   By: Ulyses Jarred M.D.   On: 06/20/2018 06:33   Ct Cervical Spine Wo Contrast  Result Date: 06/20/2018 CLINICAL DATA:  Fall backwards with impact to the posterior head. EXAM: CT HEAD WITHOUT CONTRAST CT CERVICAL SPINE WITHOUT CONTRAST TECHNIQUE: Multidetector CT imaging of the head and cervical spine was performed following the standard protocol without intravenous contrast. Multiplanar CT image reconstructions of the cervical spine were also generated. COMPARISON:  CT of the head and cervical spine 05/20/2017 FINDINGS: CT HEAD FINDINGS Brain: There is no mass, hemorrhage or extra-axial collection. The size and configuration of the ventricles and extra-axial CSF spaces are normal. There is no acute or chronic infarction. There is hypoattenuation of the periventricular white matter, most commonly indicating chronic ischemic microangiopathy. Vascular: No abnormal hyperdensity of the major intracranial arteries or dural venous sinuses. No intracranial atherosclerosis. Skull:  The visualized skull base, calvarium and extracranial soft tissues are normal. Sinuses/Orbits: No fluid levels or advanced mucosal thickening of the visualized paranasal sinuses. No mastoid or middle ear effusion. The orbits are normal. CT CERVICAL SPINE FINDINGS Alignment: No static subluxation. Facets are aligned. Occipital condyles are normally positioned. Skull base and vertebrae: No acute fracture. There is ACDF at C4-C7. There is solid osseous fusion anteriorly. Soft tissues and spinal canal: No prevertebral fluid or swelling. No visible canal hematoma. Disc levels: There is multilevel moderate-to-severe facet arthrosis, right worse than left. No bony spinal canal stenosis. Upper chest: No pneumothorax, pulmonary nodule or pleural effusion. Other: Normal visualized paraspinal cervical soft tissues. IMPRESSION: 1. Chronic ischemic microangiopathy without acute intracranial abnormality. 2. No skull fracture. 3. No fracture or static subluxation of the cervical spine. Electronically Signed   By: Ulyses Jarred M.D.   On: 06/20/2018 06:33    EKG None  Radiology No results found.  Procedures Procedures (including critical care time)  Medications Ordered in ED Medications  lidocaine (LIDODERM) 5 % 2 patch (has no administration in time range)       Final Clinical Impressions(s) / ED Diagnoses   Return for weakness, numbness, changes in vision or speech, fevers >100.4 unrelieved by medication, shortness of breath, intractable vomiting, or diarrhea, abdominal pain, Inability to tolerate liquids or food, cough, altered mental status or any concerns. No signs of systemic illness or infection. The patient is nontoxic-appearing on exam and vital signs are within normal limits.    I have reviewed the triage vital signs and the nursing notes. Pertinent labs &imaging results that were available during my care of the patient were reviewed by me and considered in my medical decision making (see  chart for details).  After history, exam, and medical workup I feel the patient has been appropriately medically screened and is safe for discharge home. Pertinent diagnoses were discussed with the patient. Patient was given return precautions.   Teron Blais, MD 06/20/18 [phone]

## 2018-06-20 NOTE — ED Triage Notes (Signed)
Pt was going to the restroom this morning and felt dizzy falling backwards hitting the back of her head and lower back on bathroom floor. Pt states this is the first fall she has had in over a year.  Was using her walker as she is supposed to.  No blood thinners. Hypertensive at 220/119 then 192/102.  Pt has tremors at baseline.  No LOC. No dizziness or weakness at this time. States she just "lost her balance" Complaints of low back pain. Hx of multiple back surgeries.

## 2018-06-20 NOTE — Telephone Encounter (Signed)
Pt is completely out of needles for humalog quick pen and is requesting a refill. Uses [facility].

## 2018-06-20 NOTE — Telephone Encounter (Signed)
sent 

## 2018-06-27 ENCOUNTER — Ambulatory Visit: Payer: Medicare Other | Admitting: Neurology

## 2018-06-27 ENCOUNTER — Ambulatory Visit (HOSPITAL_COMMUNITY): Payer: Medicare Other

## 2018-06-27 ENCOUNTER — Other Ambulatory Visit: Payer: Self-pay

## 2018-06-27 ENCOUNTER — Encounter: Payer: Self-pay | Admitting: Neurology

## 2018-06-27 VITALS — BP 219/92 | HR 77 | Resp 18 | Ht 69.0 in | Wt 225.0 lb

## 2018-06-27 DIAGNOSIS — I951 Orthostatic hypotension: Secondary | ICD-10-CM | POA: Diagnosis not present

## 2018-06-27 DIAGNOSIS — G250 Essential tremor: Secondary | ICD-10-CM | POA: Diagnosis not present

## 2018-06-27 NOTE — Progress Notes (Signed)
Reason for visit: Tremor, diabetic neuropathy, gait disorder, orthostatic hypotension  [name] is an 84 y.o. female  History of present illness:  [name] is an 84 year old right-handed white female with a history of a chronic gait disorder, the patient uses a walker for ambulation.  The patient fell and went to the emergency room on 20 June 2018.  The patient was in her bathroom, she was washing her face and then stood up and then fell backwards.  The patient underwent a CT scan of the brain and cervical spine that were unremarkable.  This is the only fall that she has had since seen last in April 2019.  The patient has a mild memory disorder that she believes has been quite stable.  The patient does not operate a motor vehicle, she does very little cooking, she claims that she has good and bad days with the tremor.  The patient has been cut back on the midodrine, she is taking 5 mg in the morning, she may be quite hypertensive at times.  The patient returns to this office for an evaluation.  The patient takes Cymbalta for the neuropathy, she believes that this is adequate for her.  She remains on propranolol for the tremor.  Past Medical History:  Diagnosis Date  . Anemia    as a teenager  . Arthritis   . Cancer ([place])    skin  . Cholelithiasis   . Chronic insomnia   . Chronic low back pain   . Complication of anesthesia   . DDD (degenerative disc disease), lumbar   . Diabetes mellitus    Iddm x 12 years  . Diabetic peripheral neuropathy ([place])   . Essential tremor 04/01/2015  . GERD (gastroesophageal reflux disease)   . Hx of acute renal failure 02/2014   admitted to [facility]  . Hx: UTI (urinary tract infection) 02/2014  . Hyperlipemia   . Hypertension   . Memory disorder 08/06/2017  . Obesity   . Peripheral edema   . Pneumonia    as a child  . PONV (postoperative nausea and vomiting)    pt states Zofran not that helpful in the past, Phenergan works best for her   . Staph infection    1970's  . Umbilical hernia   . Wrist fracture    left October 2014    Past Surgical History:  Procedure Laterality Date  . ABDOMINAL HYSTERECTOMY    . BACK SURGERY  2005  . CARDIAC CATHETERIZATION  2000   Dr Einar Gip  . CATARACT EXTRACTION Bilateral    w/ lens implants  . COLONOSCOPY  >10 years  . EYE SURGERY    . HERNIA REPAIR    . IR KYPHO LUMBAR INC FX REDUCE BONE BX UNI/BIL CANNULATION INC/IMAGING  05/18/2017  . IR KYPHO THORACIC WITH BONE BIOPSY  07/01/2017  . IR RADIOLOGIST EVAL & MGMT  06/14/2017  . IR RADIOLOGIST EVAL & MGMT  09/09/2017  . LUMBAR LAMINECTOMY/DECOMPRESSION MICRODISCECTOMY Left 03/16/2014   Procedure: LUMBAR LAMINECTOMY/DECOMPRESSION MICRODISCECTOMY 1 LEVEL  lumbar level three/four;  Surgeon: [name], MD;  Location: [facility];  Service: Neurosurgery;  Laterality: Left;  . LUMBAR LAMINECTOMY/DECOMPRESSION MICRODISCECTOMY Right 02/06/2015   Procedure: Right L4-5 Microdiskectomy;  Surgeon: Leeroy Cha, MD;  Location: [facility];  Service: Neurosurgery;  Laterality: Right;  Right L4-5 Microdiskectomy  . NECK SURGERY    . OOPHORECTOMY    . TOTAL KNEE ARTHROPLASTY Right     Family History  Problem Relation Age of Onset  . Cancer Brother        prostate  . Heart disease Brother   . Alzheimer's disease Brother   . Diabetes Brother   . Stroke Mother   . Hypertension Mother   . COPD Brother   . Cancer Brother        Prostate cancer  . Hypertension Sister        Congestive heart failure  . Diabetes Sister   . Heart attack Son     Social history:  reports that she has never smoked. She has never used smokeless tobacco. She reports that she does not drink alcohol or use drugs.    Allergies  Allergen Reactions  . Ciprofloxacin Other (See Comments)    Acute kidney failure  . Codeine Nausea And Vomiting  . Dilaudid [Hydromorphone Hcl] Nausea And Vomiting and Other (See Comments)    Severe sweating  . Hydromorphone   . Mysoline  [Primidone] Nausea Only  . Oxycodone Nausea And Vomiting    Medications:  Prior to Admission medications   Medication Sig Start Date End Date Taking? Authorizing Provider  acetaminophen (TYLENOL) 325 MG tablet Take 2 tablets (650 mg total) by mouth every 6 (six) hours as needed for mild pain (or Fever >/= 101). 05/19/17   Hosie Poisson, MD  B-D ULTRAFINE III SHORT PEN 31G X 8 MM MISC USE 1 PEN NEEDLE SUBCUTANEOUSLY EVERY DAY 06/20/18   Renato Shin, MD  Diclofenac Sodium CR 100 MG 24 hr tablet Take 1 tablet (100 mg total) by mouth daily. 06/20/18   Palumbo, April, MD  DULoxetine (CYMBALTA) 20 MG capsule TAKE 1 CAPSULE BY MOUTH TWICE A DAY 04/12/18   Kathrynn Ducking, MD  insulin lispro (HUMALOG) 100 UNIT/ML injection Inject 15-50 Units into the skin 2 (two) times daily. 50/15    [provider]  lidocaine (LIDODERM) 5 % Place 1 patch onto the skin daily. Remove & Discard patch within 12 hours or as directed by MD 06/20/18   Randal Buba, April, MD  losartan (COZAAR) 50 MG tablet Take 25 mg by mouth daily.  04/28/17   [provider]  Menthol, Topical Analgesic, (BLUE-EMU MAXIMUM STRENGTH) 2.5 % LIQD Apply 1 application topically 2 (two) times daily as needed.    [provider]  midodrine (PROAMATINE) 5 MG tablet Take 5 mg by mouth 2 (two) times daily. 05/03/17   [provider]  propranolol (INDERAL) 10 MG tablet Take 20 mg by mouth 2 (two) times daily.  03/18/17   [provider]    ROS:  Out of a complete 14 system review of symptoms, the patient complains only of the following symptoms, and all other reviewed systems are negative.  Tremors Incontinence of the bladder Memory loss  Blood pressure (!) 219/92, pulse 77, resp. rate 18, height 5\' 9"  (1.753 m), weight 225 lb (102.1 kg).  Physical Exam  General: The patient is alert and cooperative at the time of the examination.  The patient is moderately to markedly obese.  Skin: 2+ edema is noted  below the knees bilaterally.   Neurologic Exam  Mental status: The patient is alert and oriented x 3 at the time of the examination. The Mini-Mental status examination done today shows a total score 29/30.  The patient is able to name 8 four-legged animals in 1 minute..   Cranial nerves: Facial symmetry is present. Speech is normal, no aphasia or dysarthria is noted. Extraocular movements are full. Visual fields  are full.  Motor: The patient has good strength in all 4 extremities.  Sensory examination: Soft touch sensation is symmetric on the face, arms, and legs.  Coordination: The patient has good finger-nose-finger and heel-to-shin bilaterally.  Intention tremors are seen with finger-nose-finger bilaterally, some head and neck tremor is also noted.  Gait and station: The patient has a slightly wide-based gait, the patient usually uses a walker for ambulation.  Romberg is negative.  Reflexes: Deep tendon reflexes are symmetric, but are depressed.   CT head and cervical 06/20/18:  IMPRESSION: 1. Chronic ischemic microangiopathy without acute intracranial abnormality. 2. No skull fracture. 3. No fracture or static subluxation of the cervical spine.  * CT scan images were reviewed online. I agree with the written report.    Assessment/Plan:  1.  Peripheral neuropathy  2.  Gait disorder, recent fall  3.  Essential tremor  4.  Mild memory disturbance  The patient only gets Cymbalta through this office, we will follow the memory issues over time, the patient does not wish to go on anything for memory currently.  The patient has been relatively stable otherwise, she will follow-up in 1 year.   Jill Alexanders MD 06/27/2018 10:12 AM  [facility] [address] [place], [place] [address]  Phone [phone] Fax [phone]

## 2018-06-29 ENCOUNTER — Encounter: Payer: Self-pay | Admitting: Endocrinology

## 2018-06-29 ENCOUNTER — Ambulatory Visit (INDEPENDENT_AMBULATORY_CARE_PROVIDER_SITE_OTHER): Payer: Medicare Other | Admitting: Endocrinology

## 2018-06-29 DIAGNOSIS — R413 Other amnesia: Secondary | ICD-10-CM

## 2018-06-29 DIAGNOSIS — Z23 Encounter for immunization: Secondary | ICD-10-CM | POA: Diagnosis not present

## 2018-06-29 DIAGNOSIS — Z794 Long term (current) use of insulin: Secondary | ICD-10-CM

## 2018-06-29 DIAGNOSIS — E1129 Type 2 diabetes mellitus with other diabetic kidney complication: Secondary | ICD-10-CM | POA: Diagnosis not present

## 2018-06-29 MED ORDER — INSULIN LISPRO 100 UNIT/ML ~~LOC~~ SOLN: SUBCUTANEOUS | 11 refills | Status: DC

## 2018-06-29 MED ORDER — INSULIN LISPRO 100 UNIT/ML (KWIKPEN): PEN_INJECTOR | SUBCUTANEOUS | 11 refills | Status: DC

## 2018-06-29 NOTE — Patient Instructions (Addendum)
check your blood sugar twice a day.  vary the time of day when you check, between before the 3 meals, and at bedtime.  also check if you have symptoms of your blood sugar being too high or too low.  please keep a record of the readings and bring it to your next appointment here (or you can bring the meter itself).  You can write it on any piece of paper.  please call us sooner if your blood sugar goes below 70, or if you have a lot of readings over 200. Please continue the same insulin for now, but please take the humalog with the evening meal.   Please come back for a follow-up appointment in 1 month.

## 2018-06-29 NOTE — Progress Notes (Signed)
Subjective:    Patient ID: [name], female    DOB: April 10, 1933, 84 y.o.   MRN: [id number]  HPI Pt returns for f/u of diabetes mellitus: DM type: Insulin-requiring type 2 Dx'ed: 2229 Complications: polyneuropathy, renal failure, and autonomic neuropathy Therapy: insulin since 2010 GDM: never DKA: never [name] hypoglycemia: never Pancreatitis: never Pancreatic imaging: normal on 2015 MRI Other: She lives in independent living, and takes her own insulin; however, she is not a candidate for more frequent injections, due to memory loss Interval history: She takes humalog 50 units with breakfast, and 15 with supper or at HS.  she brings a record of her cbg's which I have reviewed today.  It varies from 81-274.  There is no trend throughout the day.  She eats meals at 7-10 AM, 2 PM, and 7-8 PM.  Pt says she never misses the insulin, and dtr agrees.   Past Medical History:  Diagnosis Date  . Anemia    as a teenager  . Arthritis   . Cancer ([place])    skin  . Cholelithiasis   . Chronic insomnia   . Chronic low back pain   . Complication of anesthesia   . DDD (degenerative disc disease), lumbar   . Diabetes mellitus    Iddm x 12 years  . Diabetic peripheral neuropathy ([place])   . Essential tremor 04/01/2015  . GERD (gastroesophageal reflux disease)   . Hx of acute renal failure 02/2014   admitted to [facility]  . Hx: UTI (urinary tract infection) 02/2014  . Hyperlipemia   . Hypertension   . Memory disorder 08/06/2017  . Obesity   . Peripheral edema   . Pneumonia    as a child  . PONV (postoperative nausea and vomiting)    pt states Zofran not that helpful in the past, Phenergan works best for her  . Staph infection    1970's  . Umbilical hernia   . Wrist fracture    left October 2014    Past Surgical History:  Procedure Laterality Date  . ABDOMINAL HYSTERECTOMY    . BACK SURGERY  2005  . CARDIAC CATHETERIZATION  2000   Dr Einar Gip  . CATARACT EXTRACTION Bilateral    w/  lens implants  . COLONOSCOPY  >10 years  . EYE SURGERY    . HERNIA REPAIR    . IR KYPHO LUMBAR INC FX REDUCE BONE BX UNI/BIL CANNULATION INC/IMAGING  05/18/2017  . IR KYPHO THORACIC WITH BONE BIOPSY  07/01/2017  . IR RADIOLOGIST EVAL & MGMT  06/14/2017  . IR RADIOLOGIST EVAL & MGMT  09/09/2017  . LUMBAR LAMINECTOMY/DECOMPRESSION MICRODISCECTOMY Left 03/16/2014   Procedure: LUMBAR LAMINECTOMY/DECOMPRESSION MICRODISCECTOMY 1 LEVEL  lumbar level three/four;  Surgeon: [name], MD;  Location: [place];  Service: Neurosurgery;  Laterality: Left;  . LUMBAR LAMINECTOMY/DECOMPRESSION MICRODISCECTOMY Right 02/06/2015   Procedure: Right L4-5 Microdiskectomy;  Surgeon: Leeroy Cha, MD;  Location: [place] [facility];  Service: Neurosurgery;  Laterality: Right;  Right L4-5 Microdiskectomy  . NECK SURGERY    . OOPHORECTOMY    . TOTAL KNEE ARTHROPLASTY Right     Social History   Socioeconomic History  . Marital status: Widowed    Spouse name: Not on file  . Number of children: 3  . Years of education: 57  . Highest education level: Not on file  Occupational History  . Occupation: Retired   Scientific laboratory technician  . Financial resource strain: Not on file  . Food insecurity:  Worry: Not on file    Inability: Not on file  . Transportation needs:    Medical: Not on file    Non-medical: Not on file  Tobacco Use  . Smoking status: Never Smoker  . Smokeless tobacco: Never Used  Substance and Sexual Activity  . Alcohol use: No  . Drug use: No  . Sexual activity: Never  Lifestyle  . Physical activity:    Days per week: Not on file    Minutes per session: Not on file  . Stress: Not on file  Relationships  . Social connections:    Talks on phone: Not on file    Gets together: Not on file    Attends religious service: Not on file    Active member of club or organization: Not on file    Attends meetings of clubs or organizations: Not on file    Relationship status: Not on file  . Intimate partner  violence:    Fear of current or ex partner: Not on file    Emotionally abused: Not on file    Physically abused: Not on file    Forced sexual activity: Not on file  Other Topics Concern  . Not on file  Social History Narrative   Patient lives at home alone.    Patient is widowed.    Patient has 3 children and 2 step children.    Patient is right handed.    Patient has a high school education and CNA certificate.     Patient is retired.    Patient does not drink caffeine.    Current Outpatient Medications on File Prior to Visit  Medication Sig Dispense Refill  . acetaminophen (TYLENOL) 325 MG tablet Take 2 tablets (650 mg total) by mouth every 6 (six) hours as needed for mild pain (or Fever >/= 101).    . Diclofenac Sodium CR 100 MG 24 hr tablet Take 1 tablet (100 mg total) by mouth daily. 10 tablet 0  . DULoxetine (CYMBALTA) 20 MG capsule TAKE 1 CAPSULE BY MOUTH TWICE A DAY 180 capsule 2  . lidocaine (LIDODERM) 5 % Place 1 patch onto the skin daily. Remove & Discard patch within 12 hours or as directed by MD 30 patch 0  . losartan (COZAAR) 50 MG tablet Take 25 mg by mouth daily.     . Menthol, Topical Analgesic, (BLUE-EMU MAXIMUM STRENGTH) 2.5 % LIQD Apply 1 application topically 2 (two) times daily as needed.    . midodrine (PROAMATINE) 5 MG tablet Take 5 mg by mouth daily.    . propranolol (INDERAL) 10 MG tablet Take 20 mg by mouth 2 (two) times daily.      No current facility-administered medications on file prior to visit.     Allergies  Allergen Reactions  . Ciprofloxacin Other (See Comments)    Acute kidney failure  . Codeine Nausea And Vomiting  . Dilaudid [Hydromorphone Hcl] Nausea And Vomiting and Other (See Comments)    [name] sweating  . Hydromorphone   . Mysoline [Primidone] Nausea Only  . Oxycodone Nausea And Vomiting    Family History  Problem Relation Age of Onset  . Cancer Brother        prostate  . Heart disease Brother   . Alzheimer's disease Brother    . Diabetes Brother   . Stroke Mother   . Hypertension Mother   . COPD Brother   . Cancer Brother        Prostate cancer  .  Hypertension Sister        Congestive heart failure  . Diabetes Sister   . Heart attack Son     BP 136/72   Pulse 87   Ht 5' 8.5" (1.74 m)   Wt 224 lb (101.6 kg)   SpO2 97%   BMI 33.56 kg/m   Review of Systems She denies hypoglycemia.      Objective:   Physical Exam VITAL SIGNS:  See vs page GENERAL: no distress Pulses: dorsalis pedis intact bilat.   MSK: no deformity of the feet CV: 1+ bilat leg edema Skin:  no ulcer on the feet.  normal color and temp on the feet. Neuro: sensation is intact to touch on the feet.  Ext: There is bilateral onychomycosis of the toenails.     Lab Results  Component Value Date   HGBA1C 9.3 (A) 05/27/2018   Lab Results  Component Value Date   CREATININE 1.11 (H) 07/01/2017   BUN 22 (H) 07/01/2017   NA 141 07/01/2017   K 4.0 07/01/2017   CL 102 07/01/2017   CO2 27 07/01/2017      Assessment & Plan:  Insulin-requiring type 2 DM: she needs increased rx, but I hesitate to change rx, because it was chosen by previous endocrinologist after failure of several other options.  Memory loss: we'll stay with 2 or fewer insulin dose per day. Renal insuff: this is the likely reason she does not seem to need basal insulin.   Patient Instructions  check your blood sugar twice a day.  vary the time of day when you check, between before the 3 meals, and at bedtime.  also check if you have symptoms of your blood sugar being too high or too low.  please keep a record of the readings and bring it to your next appointment here (or you can bring the meter itself).  You can write it on any piece of paper.  please call us sooner if your blood sugar goes below 70, or if you have a lot of readings over 200. Please continue the same insulin for now, but please take the humalog with the evening meal.   Please come back for a follow-up  appointment in 1 month.

## 2018-07-08 ENCOUNTER — Other Ambulatory Visit: Payer: Self-pay

## 2018-07-08 MED ORDER — GLUCOSE BLOOD VI STRP: ORAL_STRIP | 12 refills | Status: DC

## 2018-07-21 ENCOUNTER — Telehealth: Payer: Self-pay | Admitting: Endocrinology

## 2018-07-21 NOTE — Telephone Encounter (Signed)
Patient needs new Rx sent for Microlet [id number] - [place], [place]  Please Advise, thanks

## 2018-07-22 ENCOUNTER — Other Ambulatory Visit: Payer: Self-pay

## 2018-07-22 MED ORDER — MICROLET LANCETS MISC: 1 refills | Status: AC

## 2018-07-22 NOTE — Telephone Encounter (Signed)
Received notification from pt re: request to refill Microlets. Request authorized and refill sent as requested.

## 2018-07-22 NOTE — Telephone Encounter (Signed)
Rx sent today as requested

## 2018-07-26 ENCOUNTER — Ambulatory Visit: Payer: Medicare Other | Admitting: Endocrinology

## 2018-07-26 DIAGNOSIS — Z0289 Encounter for other administrative examinations: Secondary | ICD-10-CM

## 2018-08-22 ENCOUNTER — Ambulatory Visit: Payer: Medicare Other | Admitting: Endocrinology

## 2018-08-22 ENCOUNTER — Encounter: Payer: Self-pay | Admitting: Endocrinology

## 2018-08-22 VITALS — BP 148/78 | HR 80 | Ht 69.0 in | Wt 222.0 lb

## 2018-08-22 DIAGNOSIS — N183 Chronic kidney disease, stage 3 unspecified: Secondary | ICD-10-CM

## 2018-08-22 DIAGNOSIS — Z794 Long term (current) use of insulin: Secondary | ICD-10-CM | POA: Diagnosis not present

## 2018-08-22 DIAGNOSIS — E1122 Type 2 diabetes mellitus with diabetic chronic kidney disease: Secondary | ICD-10-CM | POA: Diagnosis not present

## 2018-08-22 LAB — POCT GLYCOSYLATED HEMOGLOBIN (HGB A1C): Hemoglobin A1C: 7.2 % — AB (ref 4.0–5.6)

## 2018-08-22 MED ORDER — INSULIN LISPRO (1 UNIT DIAL) 100 UNIT/ML (KWIKPEN): PEN_INJECTOR | SUBCUTANEOUS | 11 refills | Status: DC

## 2018-08-22 NOTE — Patient Instructions (Addendum)
check your blood sugar twice a day.  vary the time of day when you check, between before the 3 meals, and at bedtime.  also check if you have symptoms of your blood sugar being too high or too low.  please keep a record of the readings and bring it to your next appointment here (or you can bring the meter itself).  You can write it on any piece of paper.  please call us sooner if your blood sugar goes below 70, or if you have a lot of readings over 200. Please continue the same insulin for now, but please take the humalog with the evening meal.   Please come back for a follow-up appointment in 2 months.

## 2018-08-22 NOTE — Progress Notes (Signed)
Subjective:    Patient ID: [name], female    DOB: Nov 02, 1932, 84 y.o.   MRN: [id number]  HPI Pt returns for f/u of diabetes mellitus: DM type: Insulin-requiring type 2 Dx'ed: 8466 Complications: polyneuropathy, renal failure, and autonomic neuropathy.  Therapy: insulin since 2010 GDM: never DKA: never [name] hypoglycemia: never Pancreatitis: never Pancreatic imaging: normal on 2015 MRI Other: She lives in independent living, and takes her own insulin; however, she is not a candidate for multiple daily injections, due to memory loss; She eats meals at 7-10 AM, 2 PM, and 7-8 PM Interval history: She takes humalog 50 units with breakfast, and 15 at HS (not with supper).  she brings a record of her cbg's which I have reviewed today.  It varies from 139-371.  It is in general higher as the day goes on.  Pt says she never misses the insulin.  pt states she feels well in general. Past Medical History:  Diagnosis Date  . Anemia    as a teenager  . Arthritis   . Cancer ([place])    skin  . Cholelithiasis   . Chronic insomnia   . Chronic low back pain   . Complication of anesthesia   . DDD (degenerative disc disease), lumbar   . Diabetes mellitus    Iddm x 12 years  . Diabetic peripheral neuropathy ([place])   . Essential tremor 04/01/2015  . GERD (gastroesophageal reflux disease)   . Hx of acute renal failure 02/2014   admitted to [facility]  . Hx: UTI (urinary tract infection) 02/2014  . Hyperlipemia   . Hypertension   . Memory disorder 08/06/2017  . Obesity   . Peripheral edema   . Pneumonia    as a child  . PONV (postoperative nausea and vomiting)    pt states Zofran not that helpful in the past, Phenergan works best for her  . Staph infection    1970's  . Umbilical hernia   . Wrist fracture    left October 2014    Past Surgical History:  Procedure Laterality Date  . ABDOMINAL HYSTERECTOMY    . BACK SURGERY  2005  . CARDIAC CATHETERIZATION  2000   Dr Einar Gip  .  CATARACT EXTRACTION Bilateral    w/ lens implants  . COLONOSCOPY  >10 years  . EYE SURGERY    . HERNIA REPAIR    . IR KYPHO LUMBAR INC FX REDUCE BONE BX UNI/BIL CANNULATION INC/IMAGING  05/18/2017  . IR KYPHO THORACIC WITH BONE BIOPSY  07/01/2017  . IR RADIOLOGIST EVAL & MGMT  06/14/2017  . IR RADIOLOGIST EVAL & MGMT  09/09/2017  . LUMBAR LAMINECTOMY/DECOMPRESSION MICRODISCECTOMY Left 03/16/2014   Procedure: LUMBAR LAMINECTOMY/DECOMPRESSION MICRODISCECTOMY 1 LEVEL  lumbar level three/four;  Surgeon: [name], MD;  Location: [place];  Service: Neurosurgery;  Laterality: Left;  . LUMBAR LAMINECTOMY/DECOMPRESSION MICRODISCECTOMY Right 02/06/2015   Procedure: Right L4-5 Microdiskectomy;  Surgeon: Leeroy Cha, MD;  Location: [place] [facility];  Service: Neurosurgery;  Laterality: Right;  Right L4-5 Microdiskectomy  . NECK SURGERY    . OOPHORECTOMY    . TOTAL KNEE ARTHROPLASTY Right     Social History   Socioeconomic History  . Marital status: Widowed    Spouse name: Not on file  . Number of children: 3  . Years of education: 92  . Highest education level: Not on file  Occupational History  . Occupation: Retired   Scientific laboratory technician  . Financial resource strain: Not on  file  . Food insecurity:    Worry: Not on file    Inability: Not on file  . Transportation needs:    Medical: Not on file    Non-medical: Not on file  Tobacco Use  . Smoking status: Never Smoker  . Smokeless tobacco: Never Used  Substance and Sexual Activity  . Alcohol use: No  . Drug use: No  . Sexual activity: Never  Lifestyle  . Physical activity:    Days per week: Not on file    Minutes per session: Not on file  . Stress: Not on file  Relationships  . Social connections:    Talks on phone: Not on file    Gets together: Not on file    Attends religious service: Not on file    Active member of club or organization: Not on file    Attends meetings of clubs or organizations: Not on file    Relationship status:  Not on file  . Intimate partner violence:    Fear of current or ex partner: Not on file    Emotionally abused: Not on file    Physically abused: Not on file    Forced sexual activity: Not on file  Other Topics Concern  . Not on file  Social History Narrative   Patient lives at home alone.    Patient is widowed.    Patient has 3 children and 2 step children.    Patient is right handed.    Patient has a high school education and CNA certificate.     Patient is retired.    Patient does not drink caffeine.    Current Outpatient Medications on File Prior to Visit  Medication Sig Dispense Refill  . acetaminophen (TYLENOL) 325 MG tablet Take 2 tablets (650 mg total) by mouth every 6 (six) hours as needed for mild pain (or Fever >/= 101).    . Diclofenac Sodium CR 100 MG 24 hr tablet Take 1 tablet (100 mg total) by mouth daily. 10 tablet 0  . DULoxetine (CYMBALTA) 20 MG capsule TAKE 1 CAPSULE BY MOUTH TWICE A DAY 180 capsule 2  . glucose blood test strip Use as instructed 200 each 12  . lidocaine (LIDODERM) 5 % Place 1 patch onto the skin daily. Remove & Discard patch within 12 hours or as directed by MD 30 patch 0  . losartan (COZAAR) 50 MG tablet Take 25 mg by mouth daily.     . Menthol, Topical Analgesic, (BLUE-EMU MAXIMUM STRENGTH) 2.5 % LIQD Apply 1 application topically 2 (two) times daily as needed.    [name] MICROLET LANCETS MISC Use to monitor glucose levels BID 200 each 1  . midodrine (PROAMATINE) 5 MG tablet Take 5 mg by mouth daily.    . propranolol (INDERAL) 10 MG tablet Take 20 mg by mouth 2 (two) times daily.      No current facility-administered medications on file prior to visit.     Allergies  Allergen Reactions  . Ciprofloxacin Other (See Comments)    Acute kidney failure  . Codeine Nausea And Vomiting  . Dilaudid [Hydromorphone Hcl] Nausea And Vomiting and Other (See Comments)    [name] sweating  . Hydromorphone   . Mysoline [Primidone] Nausea Only  . Oxycodone  Nausea And Vomiting    Family History  Problem Relation Age of Onset  . Cancer Brother        prostate  . Heart disease Brother   . Alzheimer's disease Brother   .  Diabetes Brother   . Stroke Mother   . Hypertension Mother   . COPD Brother   . Cancer Brother        Prostate cancer  . Hypertension Sister        Congestive heart failure  . Diabetes Sister   . Heart attack Son     BP (!) 148/78 (BP Location: Right Arm, Patient Position: Sitting, Cuff Size: Normal)   Pulse 80   Ht 5\' 9"  (1.753 m)   Wt 222 lb (100.7 kg)   SpO2 96%   BMI 32.78 kg/m   Review of Systems She denies hypoglycemia.     Objective:   Physical Exam VITAL SIGNS:  See vs page GENERAL: no distress Pulses: dorsalis pedis intact bilat.   MSK: no deformity of the feet CV: no leg edema Skin:  no ulcer on the feet.  normal color and temp on the feet. Neuro: sensation is intact to touch on the feet.    A1c=8.9%    Assessment & Plan:  Insulin-requiring type 2 DM, with PN: she needs increased rx, if it can be done with a regimen that avoids or minimizes hypoglycemia.  The next step is to start taking pm humalog with the evening meal.  Renal insuff: she is at risk for hypoglycemia, especially at night.  Frail elderly state: she is not a candidate for aggressive glycemic control.   Patient Instructions  check your blood sugar twice a day.  vary the time of day when you check, between before the 3 meals, and at bedtime.  also check if you have symptoms of your blood sugar being too high or too low.  please keep a record of the readings and bring it to your next appointment here (or you can bring the meter itself).  You can write it on any piece of paper.  please call us sooner if your blood sugar goes below 70, or if you have a lot of readings over 200. Please continue the same insulin for now, but please take the humalog with the evening meal.   Please come back for a follow-up appointment in 2 months.

## 2018-09-13 ENCOUNTER — Telehealth: Payer: Self-pay | Admitting: Endocrinology

## 2018-09-13 NOTE — Telephone Encounter (Signed)
Patient states Dr. Loanne Drilling prescribed her Humalog. The first one picked up day 9797 on the box, patient stated new RX has 8791. Patient asked if this was ok? Please Advise, thanks

## 2018-09-13 NOTE — Telephone Encounter (Signed)
Returned pt call. Informed her to speak with pharmacist re: discrepancy. Unfortunately cannot answer question as it pertains to the packaging of her insulin. Verbalized acceptance and understanding.

## 2018-10-09 IMAGING — CR DG CHEST 2V
2 series · 2 of 2 positions shown · non-contrast
Comparison: 01/04/2017

CLINICAL DATA: Chest pain. Occasional left shoulder pain. Fall 8
days ago.

EXAM:
CHEST  2 VIEW

[w chest pa]
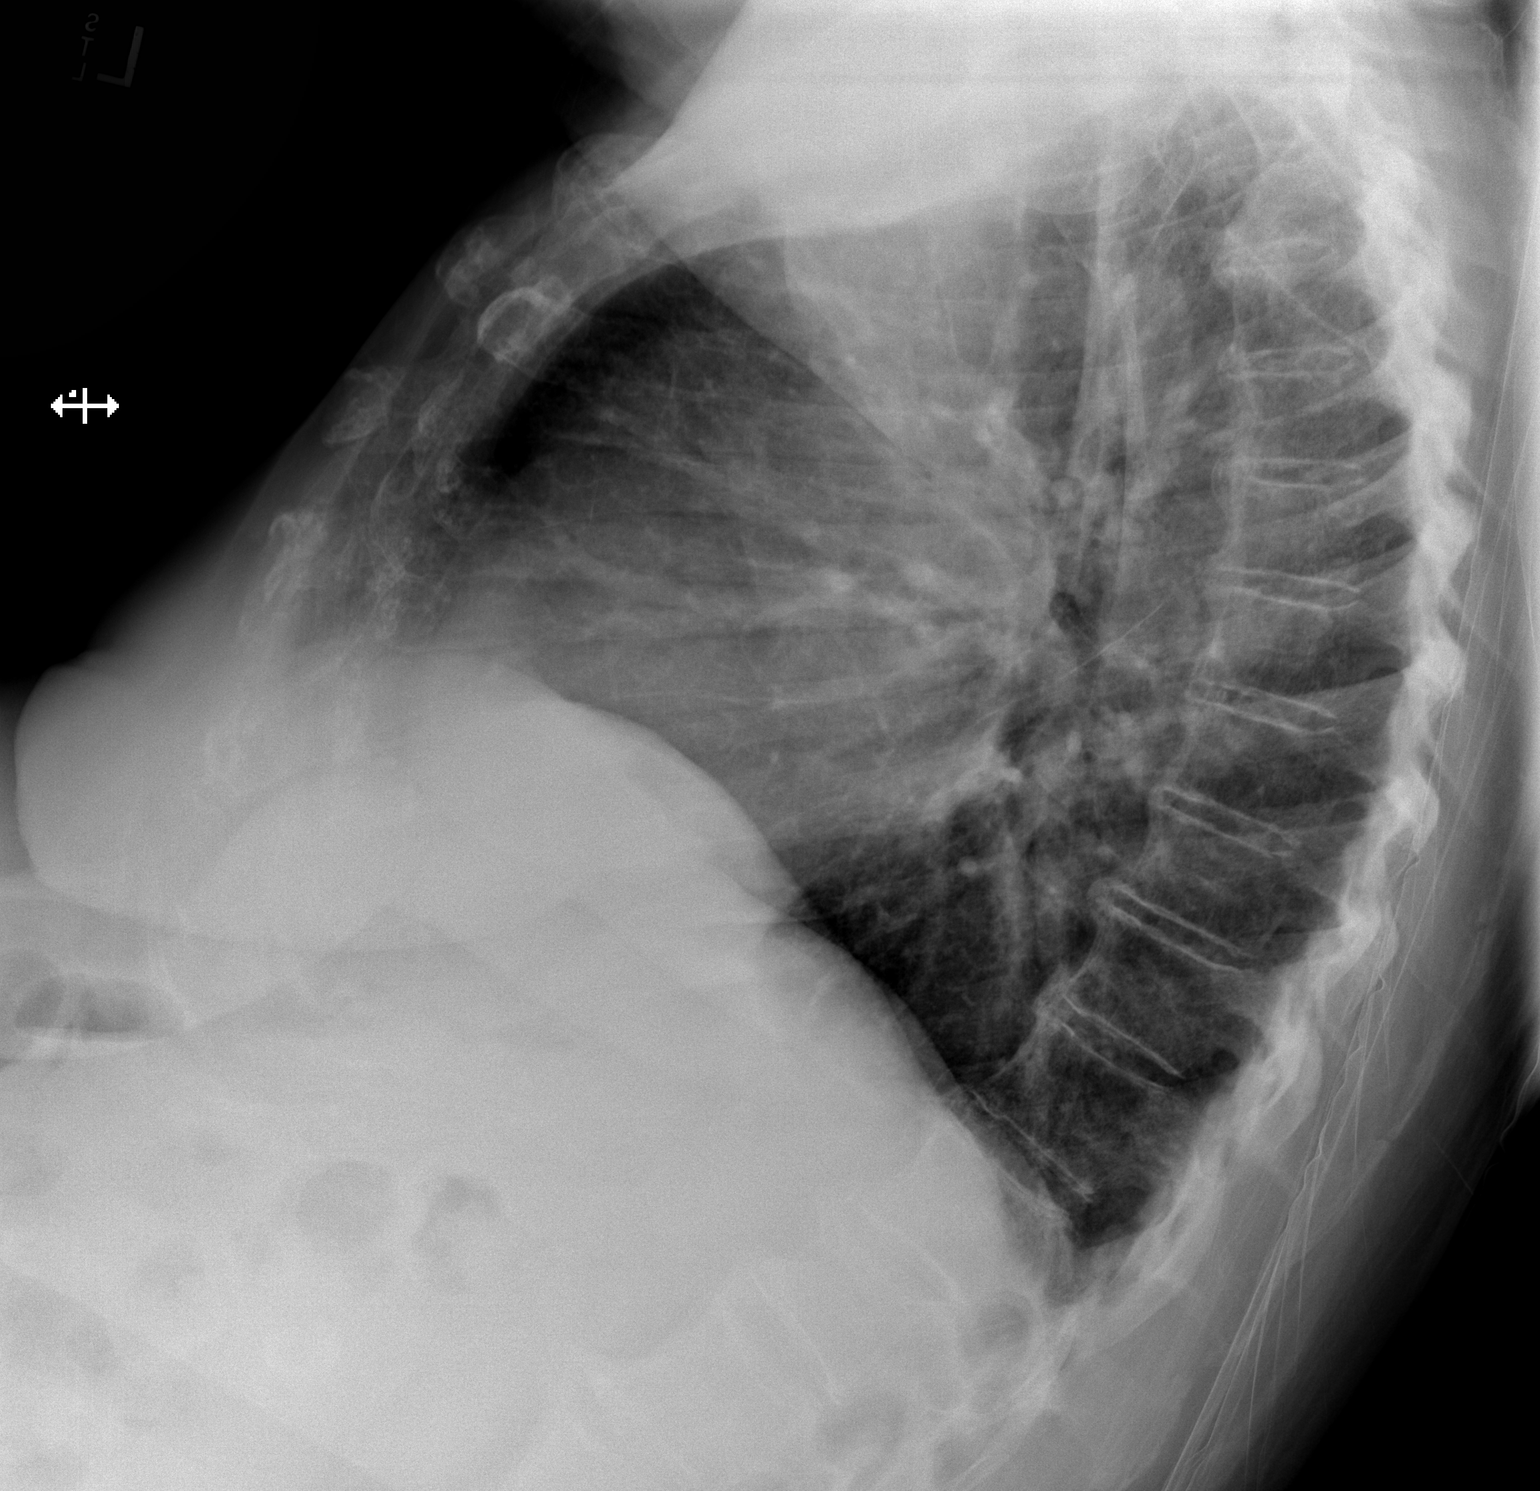

[x chest ap]
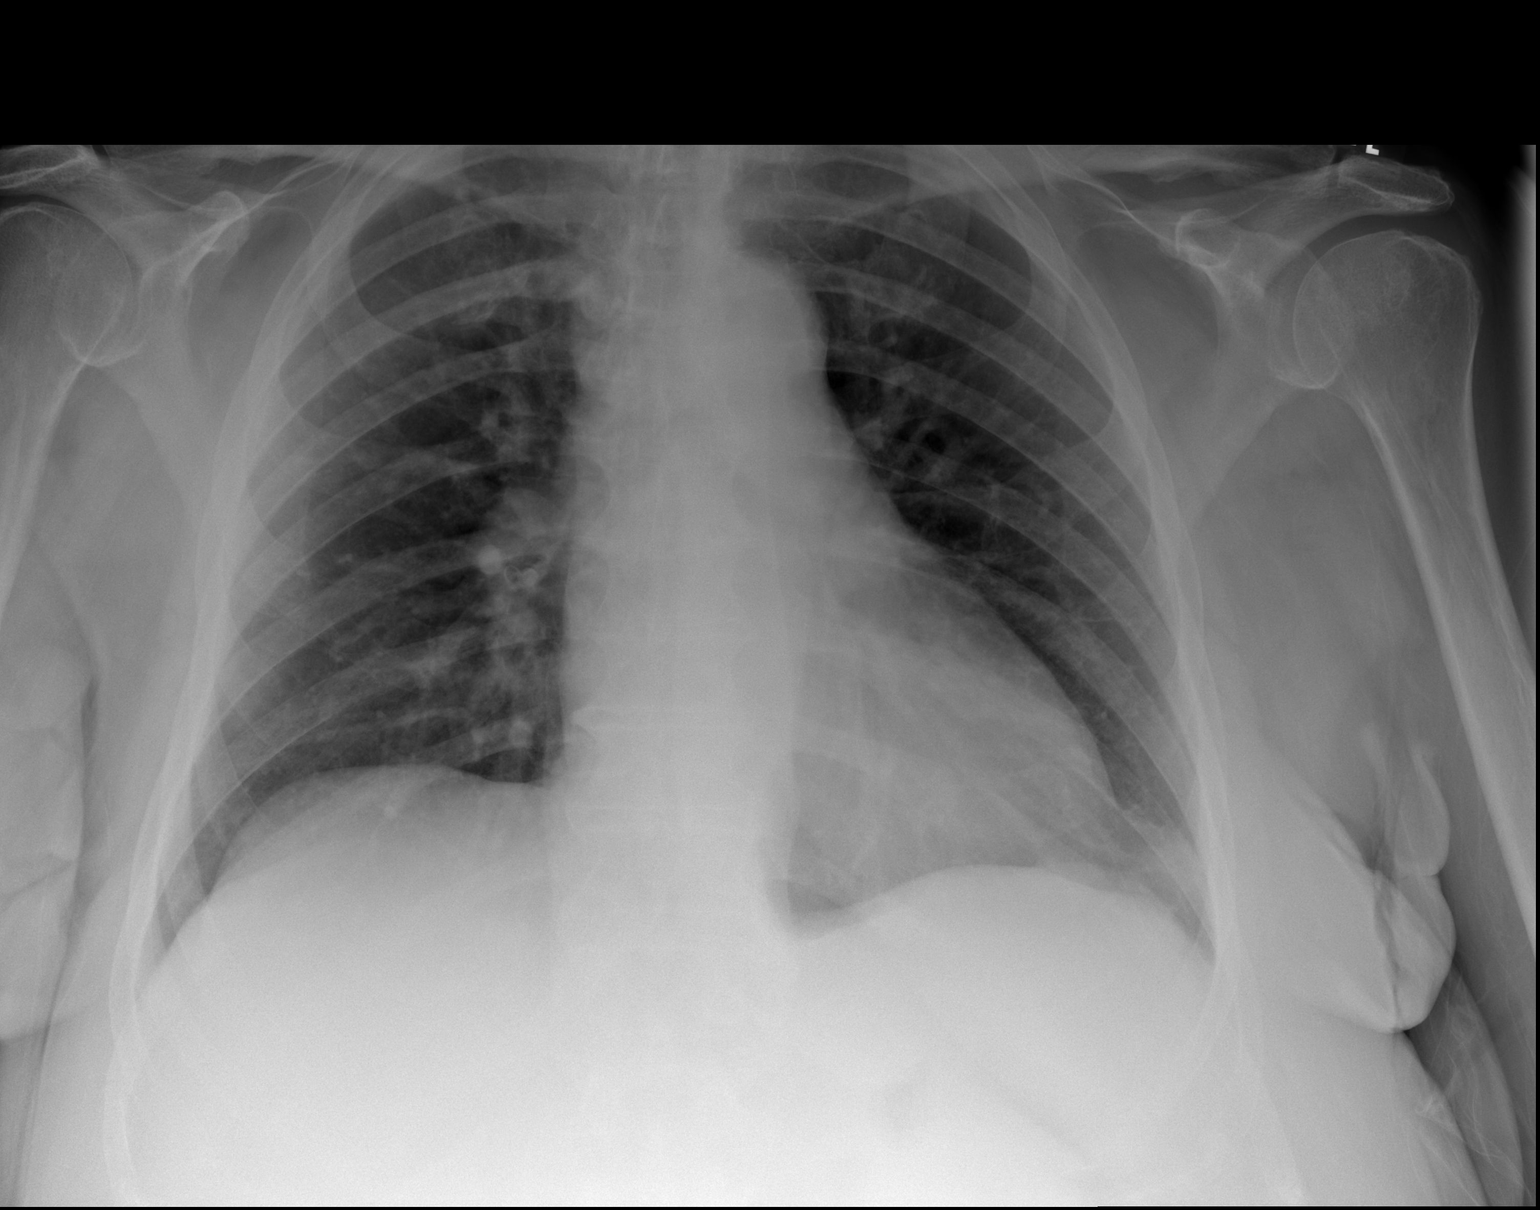

[2 of 2 positions shown; findings below may reference images not displayed]

FINDINGS: The cardiomediastinal silhouette is within normal limits. Slight
elevation of the right hemidiaphragm anteriorly is unchanged. No
airspace consolidation, edema, pleural effusion, pneumothorax is
identified. No acute osseous abnormality is identified. Prior
cervical spine fusion is noted.
IMPRESSION: No active cardiopulmonary disease.

## 2018-10-26 ENCOUNTER — Other Ambulatory Visit: Payer: Self-pay

## 2018-10-26 ENCOUNTER — Ambulatory Visit (INDEPENDENT_AMBULATORY_CARE_PROVIDER_SITE_OTHER): Payer: Medicare Other | Admitting: Endocrinology

## 2018-10-26 ENCOUNTER — Encounter: Payer: Self-pay | Admitting: Endocrinology

## 2018-10-26 VITALS — BP 164/70 | HR 72 | Ht 69.0 in | Wt 225.4 lb

## 2018-10-26 DIAGNOSIS — E1122 Type 2 diabetes mellitus with diabetic chronic kidney disease: Secondary | ICD-10-CM

## 2018-10-26 DIAGNOSIS — N183 Chronic kidney disease, stage 3 unspecified: Secondary | ICD-10-CM

## 2018-10-26 DIAGNOSIS — Z794 Long term (current) use of insulin: Secondary | ICD-10-CM

## 2018-10-26 LAB — POCT GLYCOSYLATED HEMOGLOBIN (HGB A1C): Hemoglobin A1C: 9.4 % — AB (ref 4.0–5.6)

## 2018-10-26 MED ORDER — INSULIN LISPRO (1 UNIT DIAL) 100 UNIT/ML (KWIKPEN): PEN_INJECTOR | SUBCUTANEOUS | 11 refills | Status: DC

## 2018-10-26 MED ORDER — INSULIN LISPRO PROT & LISPRO (75-25 MIX) 100 UNIT/ML KWIKPEN: PEN_INJECTOR | SUBCUTANEOUS | 11 refills | Status: DC

## 2018-10-26 MED ORDER — NEEDLES & SYRINGES MISC: 1.0000 | Freq: Two times a day (BID) | 6 refills | Status: DC

## 2018-10-26 MED ORDER — INSULIN LISPRO PROT & LISPRO (75-25 MIX) 100 UNIT/ML ~~LOC~~ SUSP: 65.0000 [IU] | Freq: Two times a day (BID) | SUBCUTANEOUS | 11 refills | Status: DC

## 2018-10-26 NOTE — Patient Instructions (Addendum)
Your blood pressure is high today.  Please see your primary care provider soon, to have it rechecked. Ms. [name]: thank you for periodically checking on pt's meds, to verify. I have sent a prescription to your pharmacy, to change back to the 75/25.   check your blood sugar twice a day.  vary the time of day when you check, between before the 3 meals, and at bedtime.  also check if you have symptoms of your blood sugar being too high or too low.  please keep a record of the readings and bring it to your next appointment here (or you can bring the meter itself).  You can write it on any piece of paper.  please call us sooner if your blood sugar goes below 70, or if you have a lot of readings over 200.  Please come back for a follow-up appointment in 2 months.

## 2018-10-26 NOTE — Progress Notes (Signed)
   Subjective:    Patient ID: [name], female    DOB: 27-Jan-1933, 85 y.o.   MRN: [id number]  HPI Pt returns for f/u of diabetes mellitus: DM type: Insulin-requiring type 2 Dx'ed: 6256 Complications: polyneuropathy, renal insuff, and autonomic neuropathy.  Therapy: insulin since 2010 GDM: never DKA: never [name] hypoglycemia: never Pancreatitis: never.  Pancreatic imaging: normal on 2015 MRI.  Other: She lives in independent living, and takes her own insulin; however, she is not a candidate for multiple daily injections, due to memory loss; She eats meals at 7-10 AM, 2 PM, and 7-8 PM Interval history: pt says she was taking 75/25 insulin until 12/19, when she started taking humalog (both were 50 units with breakfast, and 15 with supper).  no cbg record, but states cbg's vary from 58-371.  She says it was lowest at lunch.  Pt says she never misses the insulin.  pt states she feels well in general, except for hypoglycemia.  Our records show pt was only on humalog (not 75/25), since 2018.     Review of Systems Denies LOC    Objective:   Physical Exam VITAL SIGNS:  See vs page GENERAL: no distress Pulses: dorsalis pedis intact bilat.   MSK: no deformity of the feet CV: no leg edema Skin:  no ulcer on the feet.  normal color and temp on the feet. Neuro: sensation is intact to touch on the feet  Lab Results  Component Value Date   HGBA1C 9.4 (A) 10/26/2018    Lab Results  Component Value Date   CREATININE 1.11 (H) 07/01/2017   BUN 22 (H) 07/01/2017   NA 141 07/01/2017   K 4.0 07/01/2017   CL 102 07/01/2017   CO2 27 07/01/2017        Assessment & Plan:  Insulin-requiring type 2 DM, wth renal insufff: worse: she had better a1c on 75/25.   Medication error, as pt was unaware of which insulin she was taking.  we discussed.  Pt does not want to go to a higher level of care.  dtr Celene Skeen) says she will periodically check on patient to verify accuracy of med  list. HTN: is noted today  Patient Instructions  Your blood pressure is high today.  Please see your primary care provider soon, to have it rechecked. [name]: thank you for periodically checking on pt's meds, to verify. I have sent a prescription to your pharmacy, to change back to the 75/25.   check your blood sugar twice a day.  vary the time of day when you check, between before the 3 meals, and at bedtime.  also check if you have symptoms of your blood sugar being too high or too low.  please keep a record of the readings and bring it to your next appointment here (or you can bring the meter itself).  You can write it on any piece of paper.  please call us sooner if your blood sugar goes below 70, or if you have a lot of readings over 200.  Please come back for a follow-up appointment in 2 months.

## 2018-12-12 ENCOUNTER — Other Ambulatory Visit: Payer: Self-pay

## 2018-12-12 MED ORDER — PROPRANOLOL HCL 10 MG PO TABS: 10.0000 mg | ORAL_TABLET | Freq: Four times a day (QID) | ORAL | 0 refills | Status: DC

## 2018-12-28 ENCOUNTER — Ambulatory Visit: Payer: Medicare Other | Admitting: Endocrinology

## 2019-01-11 ENCOUNTER — Ambulatory Visit (INDEPENDENT_AMBULATORY_CARE_PROVIDER_SITE_OTHER): Payer: Medicare Other | Admitting: Endocrinology

## 2019-01-11 ENCOUNTER — Encounter: Payer: Self-pay | Admitting: Endocrinology

## 2019-01-11 DIAGNOSIS — E1151 Type 2 diabetes mellitus with diabetic peripheral angiopathy without gangrene: Secondary | ICD-10-CM | POA: Diagnosis not present

## 2019-01-11 DIAGNOSIS — Z794 Long term (current) use of insulin: Secondary | ICD-10-CM

## 2019-01-11 MED ORDER — INSULIN LISPRO PROT & LISPRO (75-25 MIX) 100 UNIT/ML ~~LOC~~ SUSP: 65.0000 [IU] | Freq: Two times a day (BID) | SUBCUTANEOUS | 11 refills | Status: DC

## 2019-01-11 MED ORDER — INSULIN LISPRO PROT & LISPRO (75-25 MIX) 100 UNIT/ML ~~LOC~~ SUSP: SUBCUTANEOUS | 11 refills | Status: DC

## 2019-01-11 NOTE — Patient Instructions (Addendum)
Please increase the insulin to 55 units with breakfast, and 15 with supper.   check your blood sugar twice a day.  vary the time of day when you check, between before the 3 meals, and at bedtime.  also check if you have symptoms of your blood sugar being too high or too low.  please keep a record of the readings and bring it to your next appointment here (or you can bring the meter itself).  You can write it on any piece of paper.  please call us sooner if your blood sugar goes below 70, or if you have a lot of readings over 200.  Please come back for a follow-up appointment in 1 month.

## 2019-01-11 NOTE — Progress Notes (Signed)
Subjective:    Patient ID: [name], female    DOB: Jul 10, 1933, 85 y.o.   MRN: [id number]  HPI  telehealth visit today via phone, x 11 minutes Alternatives to telehealth are presented to this patient, and the patient agrees to the telehealth visit. Pt is advised of the cost of the visit, and agrees to this, also.   Patient is at home, and I am at the office.   Persons attending the telehealth visit: the patient and I Pt returns for f/u of diabetes mellitus: DM type: Insulin-requiring type 2 Dx'ed: 4970 Complications: polyneuropathy, renal insuff, and autonomic neuropathy.  Therapy: insulin since 2010 GDM: never DKA: never [name] hypoglycemia: never Pancreatitis: never.  Pancreatic imaging: normal on 2015 MRI.  Other: She lives in independent living, and takes her own insulin; however, she is not a candidate for multiple daily injections, due to memory loss; She eats meals at 7-10 AM, 2 PM, and 7-8 PM.  Interval history: She takes 50 units qam and 15 units qpm.  She says cbg varies from 200-300's.  There is no trend throughout the day.  pt states she feels well in general.  Pt says she never misses the insulin.  Past Medical History:  Diagnosis Date  . Anemia    as a teenager  . Arthritis   . Cancer ([place])    skin  . Cholelithiasis   . Chronic insomnia   . Chronic low back pain   . Complication of anesthesia   . DDD (degenerative disc disease), lumbar   . Diabetes mellitus    Iddm x 12 years  . Diabetic peripheral neuropathy ([place])   . Essential tremor 04/01/2015  . GERD (gastroesophageal reflux disease)   . Hx of acute renal failure 02/2014   admitted to [facility]  . Hx: UTI (urinary tract infection) 02/2014  . Hyperlipemia   . Hypertension   . Memory disorder 08/06/2017  . Obesity   . Peripheral edema   . Pneumonia    as a child  . PONV (postoperative nausea and vomiting)    pt states Zofran not that helpful in the past, Phenergan works best for her  . Staph  infection    1970's  . Umbilical hernia   . Wrist fracture    left October 2014    Past Surgical History:  Procedure Laterality Date  . ABDOMINAL HYSTERECTOMY    . BACK SURGERY  2005  . CARDIAC CATHETERIZATION  2000   Dr Einar Gip  . CATARACT EXTRACTION Bilateral    w/ lens implants  . COLONOSCOPY  >10 years  . EYE SURGERY    . HERNIA REPAIR    . IR KYPHO LUMBAR INC FX REDUCE BONE BX UNI/BIL CANNULATION INC/IMAGING  05/18/2017  . IR KYPHO THORACIC WITH BONE BIOPSY  07/01/2017  . IR RADIOLOGIST EVAL & MGMT  06/14/2017  . IR RADIOLOGIST EVAL & MGMT  09/09/2017  . LUMBAR LAMINECTOMY/DECOMPRESSION MICRODISCECTOMY Left 03/16/2014   Procedure: LUMBAR LAMINECTOMY/DECOMPRESSION MICRODISCECTOMY 1 LEVEL  lumbar level three/four;  Surgeon: [name], MD;  Location: [facility];  Service: Neurosurgery;  Laterality: Left;  . LUMBAR LAMINECTOMY/DECOMPRESSION MICRODISCECTOMY Right 02/06/2015   Procedure: Right L4-5 Microdiskectomy;  Surgeon: Leeroy Cha, MD;  Location: [place] [facility];  Service: Neurosurgery;  Laterality: Right;  Right L4-5 Microdiskectomy  . NECK SURGERY    . OOPHORECTOMY    . TOTAL KNEE ARTHROPLASTY Right     Social History   Socioeconomic History  . Marital status:  Widowed    Spouse name: Not on file  . Number of children: 3  . Years of education: 36  . Highest education level: Not on file  Occupational History  . Occupation: Retired   Scientific laboratory technician  . Financial resource strain: Not on file  . Food insecurity:    Worry: Not on file    Inability: Not on file  . Transportation needs:    Medical: Not on file    Non-medical: Not on file  Tobacco Use  . Smoking status: Never Smoker  . Smokeless tobacco: Never Used  Substance and Sexual Activity  . Alcohol use: No  . Drug use: No  . Sexual activity: Never  Lifestyle  . Physical activity:    Days per week: Not on file    Minutes per session: Not on file  . Stress: Not on file  Relationships  . Social connections:     Talks on phone: Not on file    Gets together: Not on file    Attends religious service: Not on file    Active member of club or organization: Not on file    Attends meetings of clubs or organizations: Not on file    Relationship status: Not on file  . Intimate partner violence:    Fear of current or ex partner: Not on file    Emotionally abused: Not on file    Physically abused: Not on file    Forced sexual activity: Not on file  Other Topics Concern  . Not on file  Social History Narrative   Patient lives at home alone.    Patient is widowed.    Patient has 3 children and 2 step children.    Patient is right handed.    Patient has a high school education and CNA certificate.     Patient is retired.    Patient does not drink caffeine.    Current Outpatient Medications on File Prior to Visit  Medication Sig Dispense Refill  . acetaminophen (TYLENOL) 325 MG tablet Take 2 tablets (650 mg total) by mouth every 6 (six) hours as needed for mild pain (or Fever >/= 101).    . Diclofenac Sodium CR 100 MG 24 hr tablet Take 1 tablet (100 mg total) by mouth daily. 10 tablet 0  . DULoxetine (CYMBALTA) 20 MG capsule TAKE 1 CAPSULE BY MOUTH TWICE A DAY 180 capsule 2  . glucose blood test strip Use as instructed 200 each 12  . lidocaine (LIDODERM) 5 % Place 1 patch onto the skin daily. Remove & Discard patch within 12 hours or as directed by MD 30 patch 0  . losartan (COZAAR) 50 MG tablet Take 25 mg by mouth daily.     . Menthol, Topical Analgesic, (BLUE-EMU MAXIMUM STRENGTH) 2.5 % LIQD Apply 1 application topically 2 (two) times daily as needed.    [name] MICROLET LANCETS MISC Use to monitor glucose levels BID 200 each 1  . midodrine (PROAMATINE) 5 MG tablet Take 5 mg by mouth daily.    . Needles & Syringes MISC 1 Package by Does not apply route 2 (two) times daily with a meal. 100 each 6  . propranolol (INDERAL) 10 MG tablet Take 1 tablet (10 mg total) by mouth 4 (four) times daily. 360 tablet 0    No current facility-administered medications on file prior to visit.     Allergies  Allergen Reactions  . Ciprofloxacin Other (See Comments)    Acute kidney failure  .  Codeine Nausea And Vomiting  . Dilaudid [Hydromorphone Hcl] Nausea And Vomiting and Other (See Comments)    [name] sweating  . Hydromorphone   . Mysoline [Primidone] Nausea Only  . Oxycodone Nausea And Vomiting    Family History  Problem Relation Age of Onset  . Cancer Brother        prostate  . Heart disease Brother   . Alzheimer's disease Brother   . Diabetes Brother   . Stroke Mother   . Hypertension Mother   . COPD Brother   . Cancer Brother        Prostate cancer  . Hypertension Sister        Congestive heart failure  . Diabetes Sister   . Heart attack Son     Review of Systems She denies hypoglycemia.      Objective:   Physical Exam   Lab Results  Component Value Date   CREATININE 1.11 (H) 07/01/2017   BUN 22 (H) 07/01/2017   NA 141 07/01/2017   K 4.0 07/01/2017   CL 102 07/01/2017   CO2 27 07/01/2017      Assessment & Plan:  Insulin-requiring type 2 DM, with renal insuff: she needs increased rx frail elderly state; in this setting, we need to increase insulin slowly.    Patient Instructions  Please increase the insulin to 55 units with breakfast, and 15 with supper.   check your blood sugar twice a day.  vary the time of day when you check, between before the 3 meals, and at bedtime.  also check if you have symptoms of your blood sugar being too high or too low.  please keep a record of the readings and bring it to your next appointment here (or you can bring the meter itself).  You can write it on any piece of paper.  please call us sooner if your blood sugar goes below 70, or if you have a lot of readings over 200.  Please come back for a follow-up appointment in 1 month.

## 2019-02-13 ENCOUNTER — Encounter: Payer: Self-pay | Admitting: Endocrinology

## 2019-02-13 ENCOUNTER — Other Ambulatory Visit: Payer: Self-pay

## 2019-02-13 ENCOUNTER — Ambulatory Visit (INDEPENDENT_AMBULATORY_CARE_PROVIDER_SITE_OTHER): Payer: Medicare Other | Admitting: Endocrinology

## 2019-02-13 VITALS — BP 188/86 | HR 78 | Temp 98.6°F | Wt 224.0 lb

## 2019-02-13 DIAGNOSIS — E1129 Type 2 diabetes mellitus with other diabetic kidney complication: Secondary | ICD-10-CM | POA: Diagnosis not present

## 2019-02-13 DIAGNOSIS — Z794 Long term (current) use of insulin: Secondary | ICD-10-CM | POA: Diagnosis not present

## 2019-02-13 DIAGNOSIS — R609 Edema, unspecified: Secondary | ICD-10-CM | POA: Diagnosis not present

## 2019-02-13 DIAGNOSIS — E1151 Type 2 diabetes mellitus with diabetic peripheral angiopathy without gangrene: Secondary | ICD-10-CM | POA: Diagnosis not present

## 2019-02-13 LAB — POCT GLYCOSYLATED HEMOGLOBIN (HGB A1C): Hemoglobin A1C: 10.6 % — AB (ref 4.0–5.6)

## 2019-02-13 MED ORDER — INSULIN LISPRO PROT & LISPRO (75-25 MIX) 100 UNIT/ML KWIKPEN: PEN_INJECTOR | SUBCUTANEOUS | 11 refills | Status: DC

## 2019-02-13 NOTE — Progress Notes (Signed)
Subjective:    Patient ID: [name], female    DOB: March 11, 1933, 85 y.o.   MRN: [id number]  HPI Pt returns for f/u of diabetes mellitus:  DM type: Insulin-requiring type 2 Dx'ed: 5621 Complications: polyneuropathy, renal insuff, and autonomic neuropathy.  Therapy: insulin since 2010 GDM: never DKA: never [name] hypoglycemia: never Pancreatitis: never.  Pancreatic imaging: normal on 2015 MRI.  Other: She lives in independent living, and takes her own insulin; however, she is not a candidate for multiple daily injections, due to memory loss; She eats meals at 7-10 AM, 2 PM, and 7-8 PM.  Interval history: She takes 55 units qam and 15 units qpm.  She says cbg varies from 102-372.  It is in general higher as the day goes on.  pt states she feels well in general.  Pt says she never misses the insulin.  Past Medical History:  Diagnosis Date  . Anemia    as a teenager  . Arthritis   . Cancer ([place])    skin  . Cholelithiasis   . Chronic insomnia   . Chronic low back pain   . Complication of anesthesia   . DDD (degenerative disc disease), lumbar   . Diabetes mellitus    Iddm x 12 years  . Diabetic peripheral neuropathy ([place])   . Essential tremor 04/01/2015  . GERD (gastroesophageal reflux disease)   . Hx of acute renal failure 02/2014   admitted to [facility]  . Hx: UTI (urinary tract infection) 02/2014  . Hyperlipemia   . Hypertension   . Memory disorder 08/06/2017  . Obesity   . Peripheral edema   . Pneumonia    as a child  . PONV (postoperative nausea and vomiting)    pt states Zofran not that helpful in the past, Phenergan works best for her  . Staph infection    1970's  . Umbilical hernia   . Wrist fracture    left October 2014    Past Surgical History:  Procedure Laterality Date  . ABDOMINAL HYSTERECTOMY    . BACK SURGERY  2005  . CARDIAC CATHETERIZATION  2000   Dr Einar Gip  . CATARACT EXTRACTION Bilateral    w/ lens implants  . COLONOSCOPY  >10 years  . EYE  SURGERY    . HERNIA REPAIR    . IR KYPHO LUMBAR INC FX REDUCE BONE BX UNI/BIL CANNULATION INC/IMAGING  05/18/2017  . IR KYPHO THORACIC WITH BONE BIOPSY  07/01/2017  . IR RADIOLOGIST EVAL & MGMT  06/14/2017  . IR RADIOLOGIST EVAL & MGMT  09/09/2017  . LUMBAR LAMINECTOMY/DECOMPRESSION MICRODISCECTOMY Left 03/16/2014   Procedure: LUMBAR LAMINECTOMY/DECOMPRESSION MICRODISCECTOMY 1 LEVEL  lumbar level three/four;  Surgeon: [name], MD;  Location: [facility];  Service: Neurosurgery;  Laterality: Left;  . LUMBAR LAMINECTOMY/DECOMPRESSION MICRODISCECTOMY Right 02/06/2015   Procedure: Right L4-5 Microdiskectomy;  Surgeon: Leeroy Cha, MD;  Location: [place] [facility];  Service: Neurosurgery;  Laterality: Right;  Right L4-5 Microdiskectomy  . NECK SURGERY    . OOPHORECTOMY    . TOTAL KNEE ARTHROPLASTY Right     Social History   Socioeconomic History  . Marital status: Widowed    Spouse name: Not on file  . Number of children: 3  . Years of education: 54  . Highest education level: Not on file  Occupational History  . Occupation: Retired   Scientific laboratory technician  . Financial resource strain: Not on file  . Food insecurity:    Worry: Not on  file    Inability: Not on file  . Transportation needs:    Medical: Not on file    Non-medical: Not on file  Tobacco Use  . Smoking status: Never Smoker  . Smokeless tobacco: Never Used  Substance and Sexual Activity  . Alcohol use: No  . Drug use: No  . Sexual activity: Never  Lifestyle  . Physical activity:    Days per week: Not on file    Minutes per session: Not on file  . Stress: Not on file  Relationships  . Social connections:    Talks on phone: Not on file    Gets together: Not on file    Attends religious service: Not on file    Active member of club or organization: Not on file    Attends meetings of clubs or organizations: Not on file    Relationship status: Not on file  . Intimate partner violence:    Fear of current or ex partner: Not on  file    Emotionally abused: Not on file    Physically abused: Not on file    Forced sexual activity: Not on file  Other Topics Concern  . Not on file  Social History Narrative   Patient lives at home alone.    Patient is widowed.    Patient has 3 children and 2 step children.    Patient is right handed.    Patient has a high school education and CNA certificate.     Patient is retired.    Patient does not drink caffeine.    Current Outpatient Medications on File Prior to Visit  Medication Sig Dispense Refill  . acetaminophen (TYLENOL) 325 MG tablet Take 2 tablets (650 mg total) by mouth every 6 (six) hours as needed for mild pain (or Fever >/= 101).    . Diclofenac Sodium CR 100 MG 24 hr tablet Take 1 tablet (100 mg total) by mouth daily. 10 tablet 0  . DULoxetine (CYMBALTA) 20 MG capsule TAKE 1 CAPSULE BY MOUTH TWICE A DAY 180 capsule 2  . glucose blood test strip Use as instructed 200 each 12  . lidocaine (LIDODERM) 5 % Place 1 patch onto the skin daily. Remove & Discard patch within 12 hours or as directed by MD 30 patch 0  . losartan (COZAAR) 50 MG tablet Take 25 mg by mouth daily.     . Menthol, Topical Analgesic, (BLUE-EMU MAXIMUM STRENGTH) 2.5 % LIQD Apply 1 application topically 2 (two) times daily as needed.    [name] MICROLET LANCETS MISC Use to monitor glucose levels BID 200 each 1  . midodrine (PROAMATINE) 5 MG tablet Take 5 mg by mouth daily.    . Needles & Syringes MISC 1 Package by Does not apply route 2 (two) times daily with a meal. 100 each 6  . propranolol (INDERAL) 10 MG tablet Take 1 tablet (10 mg total) by mouth 4 (four) times daily. 360 tablet 0   No current facility-administered medications on file prior to visit.     Allergies  Allergen Reactions  . Ciprofloxacin Other (See Comments)    Acute kidney failure  . Codeine Nausea And Vomiting  . Dilaudid [Hydromorphone Hcl] Nausea And Vomiting and Other (See Comments)    [name] sweating  . Hydromorphone   .  Mysoline [Primidone] Nausea Only  . Oxycodone Nausea And Vomiting    Family History  Problem Relation Age of Onset  . Cancer Brother  prostate  . Heart disease Brother   . Alzheimer's disease Brother   . Diabetes Brother   . Stroke Mother   . Hypertension Mother   . COPD Brother   . Cancer Brother        Prostate cancer  . Hypertension Sister        Congestive heart failure  . Diabetes Sister   . Heart attack Son     BP (!) 188/86 (BP Location: Right Arm, Patient Position: Sitting, Cuff Size: Normal)   Pulse 78   Temp 98.6 F (37 C) (Oral)   Wt 224 lb (101.6 kg)   SpO2 96%   BMI 33.08 kg/m    Review of Systems She denies hypoglycemia    Objective:   Physical Exam VITAL SIGNS:  See vs page GENERAL: no distress Pulses: dorsalis pedis intact bilat.   MSK: no deformity of the feet CV: trace bilat leg edema, and vv's Skin:  no ulcer on the feet, but there is an abrasion near the left great toenail.  normal color and temp on the feet. Neuro: sensation is intact to touch on the feet    A1c=10.6%    Assessment & Plan:  HTN: is noted today.   Insulin-requiring type 2 DM, with renal insuff: worse.   Edema: This limits rx options.     Patient Instructions  Your blood pressure is high today.  Please see your primary care provider soon, to have it rechecked.   Please increase the insulin to 75 units with breakfast, and 10 with supper.   On this type of insulin schedule, you should eat meals on a regular schedule.  If a meal is missed or significantly delayed, your blood sugar could go low.   check your blood sugar twice a day.  vary the time of day when you check, between before the 3 meals, and at bedtime.  also check if you have symptoms of your blood sugar being too high or too low.  please keep a record of the readings and bring it to your next appointment here (or you can bring the meter itself).  You can write it on any piece of paper.  please call us sooner  if your blood sugar goes below 70, or if you have a lot of readings over 200.  Please come back for a follow-up appointment in 2 months.

## 2019-02-13 NOTE — Patient Instructions (Addendum)
Your blood pressure is high today.  Please see your primary care provider soon, to have it rechecked.   Please increase the insulin to 75 units with breakfast, and 10 with supper.   On this type of insulin schedule, you should eat meals on a regular schedule.  If a meal is missed or significantly delayed, your blood sugar could go low.   check your blood sugar twice a day.  vary the time of day when you check, between before the 3 meals, and at bedtime.  also check if you have symptoms of your blood sugar being too high or too low.  please keep a record of the readings and bring it to your next appointment here (or you can bring the meter itself).  You can write it on any piece of paper.  please call us sooner if your blood sugar goes below 70, or if you have a lot of readings over 200.  Please come back for a follow-up appointment in 2 months.

## 2019-02-14 ENCOUNTER — Other Ambulatory Visit: Payer: Self-pay | Admitting: Neurology

## 2019-03-18 ENCOUNTER — Other Ambulatory Visit: Payer: Self-pay

## 2019-03-18 ENCOUNTER — Other Ambulatory Visit: Payer: Self-pay | Admitting: Cardiology

## 2019-03-18 ENCOUNTER — Emergency Department (HOSPITAL_BASED_OUTPATIENT_CLINIC_OR_DEPARTMENT_OTHER)
Admission: EM | Admit: 2019-03-18 | Discharge: 2019-03-18 | Disposition: A | Discharge status: Home / Self Care | Payer: Medicare Other | Attending: Emergency Medicine | Admitting: Emergency Medicine

## 2019-03-18 ENCOUNTER — Emergency Department (HOSPITAL_BASED_OUTPATIENT_CLINIC_OR_DEPARTMENT_OTHER): Payer: Medicare Other

## 2019-03-18 ENCOUNTER — Encounter (HOSPITAL_BASED_OUTPATIENT_CLINIC_OR_DEPARTMENT_OTHER): Payer: Self-pay | Admitting: Emergency Medicine

## 2019-03-18 DIAGNOSIS — E119 Type 2 diabetes mellitus without complications: Secondary | ICD-10-CM | POA: Insufficient documentation

## 2019-03-18 DIAGNOSIS — M79605 Pain in left leg: Secondary | ICD-10-CM | POA: Diagnosis not present

## 2019-03-18 DIAGNOSIS — Z96659 Presence of unspecified artificial knee joint: Secondary | ICD-10-CM | POA: Diagnosis not present

## 2019-03-18 DIAGNOSIS — W19XXXA Unspecified fall, initial encounter: Secondary | ICD-10-CM | POA: Diagnosis not present

## 2019-03-18 DIAGNOSIS — M25552 Pain in left hip: Secondary | ICD-10-CM | POA: Insufficient documentation

## 2019-03-18 DIAGNOSIS — Z85820 Personal history of malignant melanoma of skin: Secondary | ICD-10-CM | POA: Insufficient documentation

## 2019-03-18 MED ORDER — DICLOFENAC SODIUM 1 % TD GEL: 2.0000 g | Freq: Four times a day (QID) | TRANSDERMAL | 0 refills | Status: DC | PRN

## 2019-03-18 MED ORDER — LIDOCAINE 5 % EX PTCH: 1.0000 | MEDICATED_PATCH | CUTANEOUS | 0 refills | Status: DC

## 2019-03-18 MED ORDER — DICLOFENAC SODIUM 1 % TD GEL: 2.0000 g | Freq: Four times a day (QID) | TRANSDERMAL | 0 refills | Status: AC | PRN

## 2019-03-18 NOTE — Discharge Instructions (Signed)
You were seen in the emergency department today after a fall 1 week ago with left hip pain.  The x-rays of the hip and knee are normal.  You likely have a muscle strain and/or bruise.  If your pain symptoms continue your primary care doctor may send you for additional imaging of the hip or refer you to an orthopedic surgeon.  Use the topical pain medication as needed and be sure to use your walking aids at home.  Return to the emergency department any new or suddenly worsening symptoms.

## 2019-03-18 NOTE — ED Provider Notes (Signed)
Emergency Department Provider Note   I have reviewed the triage vital signs and the nursing notes.   HISTORY  Chief Complaint Fall   HPI [name] is a 85 y.o. female with past medical history reviewed below presents to the emergency department with continued left hip and knee pain 1 week after mechanical fall.  Patient states that she fell between furniture in her living room and did not strike her head.  No loss of consciousness.  She landed on her left side.  She lives alone but was able to scoot and crawl out onto the floor and ultimately activated her life alert bracelet.  Help arrived to assist the patient but she was not transported to the emergency department.  She was able to get back to her normal daily activities but has had persistent pain in the left hip.  No additional falls.  No headache or neck pain.  No chest pain.  Denies abdominal or lower back pain.  Denies any unilateral numbness or weakness.   Past Medical History:  Diagnosis Date  . Anemia    as a teenager  . Arthritis   . Cancer ([place])    skin  . Cholelithiasis   . Chronic insomnia   . Chronic low back pain   . Complication of anesthesia   . DDD (degenerative disc disease), lumbar   . Diabetes mellitus    Iddm x 12 years  . Diabetic peripheral neuropathy ([place])   . Essential tremor 04/01/2015  . GERD (gastroesophageal reflux disease)   . Hx of acute renal failure 02/2014   admitted to [facility]  . Hx: UTI (urinary tract infection) 02/2014  . Hyperlipemia   . Hypertension   . Memory disorder 08/06/2017  . Obesity   . Peripheral edema   . Pneumonia    as a child  . PONV (postoperative nausea and vomiting)    pt states Zofran not that helpful in the past, Phenergan works best for her  . Staph infection    1970's  . Umbilical hernia   . Wrist fracture    left October 2014    Patient Active Problem List   Diagnosis Date Noted  . Memory disorder 08/06/2017  . Left ventricular diastolic  dysfunction with preserved systolic function 62/37/6283  . Closed compression fracture of L1 lumbar vertebra 05/17/2017  . Intractable back pain 05/17/2017  . Diabetes ([place]) 05/17/2017  . GERD (gastroesophageal reflux disease) 05/17/2017  . Hypertension 05/17/2017  . Hyperlipemia 05/17/2017  . Back pain 05/15/2017  . Syncope 01/05/2017  . Tachycardia 01/04/2017  . CKD (chronic kidney disease) stage 3, GFR 30-59 ml/min (HCC) 01/04/2017  . Diabetes mellitus with complication ([place]) 15/17/6160  . Peripheral neuropathy 01/04/2017  . Orthostatic hypotension 01/04/2017  . Fall 12/24/2016  . Acute encephalopathy 12/24/2016  . Depression 12/24/2016  . Acute renal failure superimposed on stage 3 chronic kidney disease ([place]) 12/24/2016  . Vertigo 05/16/2016  . Chest pain 05/15/2016  . Tremor, essential 04/01/2015  . Chronic low back pain 04/01/2015  . Lumbar herniated disc 02/06/2015  . Lumbar radiculopathy 03/20/2014  . UTI (lower urinary tract infection) 03/18/2014  . Hyperglycemia 03/18/2014  . Dyslipidemia 03/18/2014  . Lumbar stenosis 03/16/2014  . HTN (hypertension) 02/12/2014  . Diabetes mellitus ([place]) 02/12/2014  . Acute renal failure ([place]) 02/11/2014  . H/O total knee replacement 08/11/2013  . Gallstones 10/01/2011    Past Surgical History:  Procedure Laterality Date  . ABDOMINAL HYSTERECTOMY    .  BACK SURGERY  2005  . CARDIAC CATHETERIZATION  2000   Dr Einar Gip  . CATARACT EXTRACTION Bilateral    w/ lens implants  . COLONOSCOPY  >10 years  . EYE SURGERY    . HERNIA REPAIR    . IR KYPHO LUMBAR INC FX REDUCE BONE BX UNI/BIL CANNULATION INC/IMAGING  05/18/2017  . IR KYPHO THORACIC WITH BONE BIOPSY  07/01/2017  . IR RADIOLOGIST EVAL & MGMT  06/14/2017  . IR RADIOLOGIST EVAL & MGMT  09/09/2017  . LUMBAR LAMINECTOMY/DECOMPRESSION MICRODISCECTOMY Left 03/16/2014   Procedure: LUMBAR LAMINECTOMY/DECOMPRESSION MICRODISCECTOMY 1 LEVEL  lumbar level three/four;  Surgeon: [name], MD;  Location: [facility];  Service: Neurosurgery;  Laterality: Left;  . LUMBAR LAMINECTOMY/DECOMPRESSION MICRODISCECTOMY Right 02/06/2015   Procedure: Right L4-5 Microdiskectomy;  Surgeon: Leeroy Cha, MD;  Location: [facility];  Service: Neurosurgery;  Laterality: Right;  Right L4-5 Microdiskectomy  . NECK SURGERY    . OOPHORECTOMY    . TOTAL KNEE ARTHROPLASTY Right     Allergies Ciprofloxacin, Codeine, Dilaudid [hydromorphone hcl], Hydromorphone, Mysoline [primidone], and Oxycodone  Family History  Problem Relation Age of Onset  . Cancer Brother        prostate  . Heart disease Brother   . Alzheimer's disease Brother   . Diabetes Brother   . Stroke Mother   . Hypertension Mother   . COPD Brother   . Cancer Brother        Prostate cancer  . Hypertension Sister        Congestive heart failure  . Diabetes Sister   . Heart attack Son     Social History Social History   Tobacco Use  . Smoking status: Never Smoker  . Smokeless tobacco: Never Used  Substance Use Topics  . Alcohol use: No  . Drug use: No    Review of Systems  Constitutional: No fever/chills Eyes: No visual changes. ENT: No sore throat. Cardiovascular: Denies chest pain. Respiratory: Denies shortness of breath. Gastrointestinal: No abdominal pain.  No nausea, no vomiting.  No diarrhea.  No constipation. Genitourinary: Negative for dysuria. Musculoskeletal: Positive left hip pain.  Skin: Negative for rash. Neurological: Negative for headaches, focal weakness or numbness.  10-point ROS otherwise negative.  ____________________________________________   PHYSICAL EXAM:  VITAL SIGNS: ED Triage Vitals  Enc Vitals Group     BP 03/18/19 1135 (!) 189/85     Pulse Rate 03/18/19 1135 75     Resp 03/18/19 1135 18     Temp 03/18/19 1135 98 F (36.7 C)     Temp Source 03/18/19 1135 Oral     SpO2 03/18/19 1135 98 %     Weight 03/18/19 1134 220 lb (99.8 kg)     Height 03/18/19 1134 5\' 9"  (1.753  m)   Constitutional: Alert and oriented. Well appearing and in no acute distress. Eyes: Conjunctivae are normal. PERRL Head: Atraumatic. Nose: No congestion/rhinnorhea. Mouth/Throat: Mucous membranes are moist.  Oropharynx non-erythematous. Neck: No stridor. No cervical spine tenderness to palpation. Cardiovascular: Normal rate, regular rhythm. Good peripheral circulation. Grossly normal heart sounds.   Respiratory: Normal respiratory effort.  No retractions. Lungs CTAB. Gastrointestinal: Soft and nontender. No distention.  Musculoskeletal: No lower extremity tenderness nor edema. No gross deformities of extremities. Normal ROM of the left hip and knee with some reported discomfort. No deformity.  Neurologic:  Normal speech and language. No gross focal neurologic deficits are appreciated.  Skin:  Skin is warm, dry and intact. No rash noted.  ____________________________________________  RADIOLOGY  Dg Knee 2 Views Left  Result Date: 03/18/2019 CLINICAL DATA:  Status post fall with left knee pain. EXAM: LEFT KNEE - 1-2 VIEW COMPARISON:  None. FINDINGS: No evidence of fracture, dislocation, or joint effusion. Mild 3 compartment osteoarthritic changes of the left knee. Soft tissues are unremarkable. IMPRESSION: No acute fracture or dislocation identified about the left knee. Electronically Signed   By: Fidela Salisbury M.D.   On: 03/18/2019 12:19   Dg Hip Unilat W Or Wo Pelvis 2-3 Views Left  Result Date: 03/18/2019 CLINICAL DATA:  Left hip pain post fall 1 week ago. EXAM: DG HIP (WITH OR WITHOUT PELVIS) 2-3V LEFT COMPARISON:  None. FINDINGS: There is no evidence of hip fracture or dislocation. Mild osteoarthritic changes of the left hip. IMPRESSION: Negative. Electronically Signed   By: Fidela Salisbury M.D.   On: 03/18/2019 12:18    ____________________________________________   PROCEDURES  Procedure(s) performed:    Procedures  None ____________________________________________   INITIAL IMPRESSION / ASSESSMENT AND PLAN / ED COURSE  Pertinent labs & imaging results that were available during my care of the patient were reviewed by me and considered in my medical decision making (see chart for details).   Patient presents to the emergency department with left hip pain after a fall 1 week prior.  No evidence of head or cervical spine trauma.  Plain films of the left knee and hip reviewed with no acute findings.  Patient is ambulating well with her Rollator in the emergency department.  She has a life alert bracelet and family in town to check on her.  Plan for topical pain medication along with close PCP follow-up.  Discussed that if pain symptoms worsen or do not improve significantly over the next week she may require additional imaging, to be ordered by her PCP, to rule out occult fracture but very low suspicion for this based on my exam and review of the plain films.  ____________________________________________  FINAL CLINICAL IMPRESSION(S) / ED DIAGNOSES  Final diagnoses:  Fall, initial encounter  Left leg pain     NEW OUTPATIENT MEDICATIONS STARTED DURING THIS VISIT:  Discharge Medication List as of 03/18/2019 12:27 PM    START taking these medications   Details  diclofenac sodium (VOLTAREN) 1 % GEL Apply 2 g topically 4 (four) times daily as needed for up to 7 days., Starting Sat 03/18/2019, Until Sat 03/25/2019, Print        Note:  This document was prepared using Dragon voice recognition software and may include unintentional dictation errors.  Nanda Quinton, MD Emergency Medicine    Jacqeline Broers, [facility], MD 03/18/19 2005

## 2019-03-18 NOTE — ED Triage Notes (Signed)
Pt fell 1 week ago. C/o L hip pain.

## 2019-03-21 ENCOUNTER — Other Ambulatory Visit: Payer: Self-pay

## 2019-03-21 DIAGNOSIS — I10 Essential (primary) hypertension: Secondary | ICD-10-CM

## 2019-03-21 MED ORDER — LOSARTAN POTASSIUM 50 MG PO TABS: 25.0000 mg | ORAL_TABLET | Freq: Every day | ORAL | 3 refills | Status: DC

## 2019-04-26 ENCOUNTER — Ambulatory Visit: Payer: Medicare Other | Admitting: Endocrinology

## 2019-05-26 ENCOUNTER — Other Ambulatory Visit: Payer: Self-pay | Admitting: Surgery

## 2019-05-29 ENCOUNTER — Other Ambulatory Visit: Payer: Self-pay | Admitting: Surgery

## 2019-05-29 DIAGNOSIS — C44529 Squamous cell carcinoma of skin of other part of trunk: Secondary | ICD-10-CM

## 2019-06-01 ENCOUNTER — Ambulatory Visit
Admission: RE | Admit: 2019-06-01 | Discharge: 2019-06-01 | Disposition: A | Discharge status: Home / Self Care | Payer: Medicare Other | Source: Ambulatory Visit | Attending: Surgery | Admitting: Surgery

## 2019-06-01 DIAGNOSIS — C44529 Squamous cell carcinoma of skin of other part of trunk: Secondary | ICD-10-CM

## 2019-06-01 MED ORDER — IOPAMIDOL (ISOVUE-300) INJECTION 61%
100.0000 mL | Freq: Once | INTRAVENOUS | Status: AC | PRN
  Administered 2019-06-01: 100 mL via INTRAVENOUS

## 2019-06-05 ENCOUNTER — Ambulatory Visit: Payer: Self-pay | Admitting: General Surgery

## 2019-06-05 NOTE — H&P (View-Only) (Signed)
History of Present Illness Leighton Ruff MD; 99991111 11:00 AM) The patient is a 85 year old female who presents with a skin neoplasm. This patient is referred by Dr. Cindie Crumbly for a squamous cell cancer on the right buttock. She has had a skin lesion draining in that area for about a year. She also has had a change in bowel function. She underwent a shave biopsy of the lesion showing at least squamous cell cancer in situ. CT Abd and pelvis shows no sign of metastatic disease.   Problem List/Past Medical Leighton Ruff, MD; 99991111 11:01 AM) SQUAMOUS CELL CANCER OF SKIN OF BUTTOCK [id number])  Past Surgical History Leighton Ruff, MD; 99991111 11:01 AM) Cataract Surgery Bilateral. Hysterectomy (not due to cancer) - Complete Knee Surgery Right. Spinal Surgery - Lower Back Spinal Surgery - Neck  Diagnostic Studies History Leighton Ruff, MD; 99991111 11:01 AM) Colonoscopy >10 years ago Mammogram >3 years ago Pap Smear >5 years ago  Allergies Nance Pew, CMA; 06/05/2019 10:42 AM) Ciprofloxacin *CHEMICALS* CODEINE Nausea, Vomiting. Dilaudid *ANALGESICS - OPIOID* Nausea, Vomiting. HYDROmorphone HCl *ANALGESICS - OPIOID* Mysoline *ANTICONVULSANTS* Nausea. oxyCODONE HCl *ANALGESICS - OPIOID* Allergies Reconciled  Medication History (Sabrina Canty, CMA; 06/05/2019 10:42 AM) Losartan Potassium (50MG  Tablet, Oral) Active. Diclofenac Sodium (1% Gel, Transdermal) Active. HumaLOG Mix 75/25 KwikPen ((75-25) 100UNIT/ML Susp Pen-inj, Subcutaneous) Active. Myrbetriq (50MG  Tablet ER 24HR, Oral) Active. Propranolol HCl (10MG  Tablet, Oral) Active. Cefdinir (300MG  Capsule, Oral) Active. Picato (0.015% Gel, External) Active. Oxybutynin Chloride ER (10MG  Tablet ER 24HR, Oral) Active. Lidocaine (5% Patch, External) Active. Medications Reconciled  Social History Leighton Ruff, MD; 99991111 11:01 AM) No alcohol use No caffeine use No drug use Tobacco  use Never smoker.  Family History Leighton Ruff, MD; 99991111 11:01 AM) Arthritis Daughter, Son. Cerebrovascular Accident Father. Diabetes Mellitus Brother, Son. Hypertension Brother, Sister, Son. Melanoma Sister. Migraine Headache Daughter. Thyroid problems Daughter.  Pregnancy / Birth History Leighton Ruff, MD; 99991111 11:01 AM) Age of menopause <47 Gravida 3 Irregular periods Maternal age 12-30 Para 3  Other Problems Leighton Ruff, MD; 99991111 11:01 AM) Arthritis Back Pain Bladder Problems Diabetes Mellitus High blood pressure Melanoma Oophorectomy Bilateral. Umbilical Hernia Repair     Review of Systems Leighton Ruff MD; 99991111 11:01 AM) General Not Present- Appetite Loss, Chills, Fatigue, Fever, Night Sweats, Weight Gain and Weight Loss. Skin Not Present- Change in Wart/Mole, Dryness, Hives, Jaundice, New Lesions, Non-Healing Wounds, Rash and Ulcer. HEENT Present- Seasonal Allergies and Wears glasses/contact lenses. Not Present- Earache, Hearing Loss, Hoarseness, Nose Bleed, Oral Ulcers, Ringing in the Ears, Sinus Pain, Sore Throat, Visual Disturbances and Yellow Eyes. Breast Not Present- Breast Mass, Breast Pain, Nipple Discharge and Skin Changes. Cardiovascular Present- Swelling of Extremities. Not Present- Chest Pain, Difficulty Breathing Lying Down, Leg Cramps, Palpitations, Rapid Heart Rate and Shortness of Breath. Gastrointestinal Present- Chronic diarrhea. Not Present- Abdominal Pain, Bloating, Bloody Stool, Change in Bowel Habits, Constipation, Difficulty Swallowing, Excessive gas, Gets full quickly at meals, Hemorrhoids, Indigestion, Nausea, Rectal Pain and Vomiting. Female Genitourinary Present- Nocturia. Not Present- Frequency, Painful Urination, Pelvic Pain and Urgency. Musculoskeletal Present- Joint Pain. Not Present- Back Pain, Joint Stiffness, Muscle Pain, Muscle Weakness and Swelling of Extremities. Neurological Present-  Decreased Memory, Tremor and Trouble walking. Not Present- Fainting, Headaches, Numbness, Seizures, Tingling and Weakness. Psychiatric Not Present- Anxiety, Bipolar, Change in Sleep Pattern, Depression, Fearful and Frequent crying. Endocrine Present- Heat Intolerance. Not Present- Cold Intolerance, Excessive Hunger, Hair Changes, Hot flashes and New Diabetes. Hematology Present- Easy Bruising. Not Present- Blood  Thinners, Excessive bleeding, Gland problems, HIV and Persistent Infections.  Vitals (Sabrina Canty CMA; 06/05/2019 10:43 AM) 06/05/2019 10:43 AM Weight: 224.38 lb Height: 69in Body Surface Area: 2.17 m Body Mass Index: 33.13 kg/m  Temp.: 97.14F(Temporal)  Pulse: 96 (Regular)  P.OX: 96% (Room air) BP: 180/90 (Sitting, Left Arm, Standard)        Physical Exam Leighton Ruff MD; 99991111 11:11 AM)  General Mental Status-Alert. General Appearance-Not in acute distress. Build & Nutrition-Well nourished. Posture-Normal posture. Gait-Normal.  Head and Neck Head-normocephalic, atraumatic with no lesions or palpable masses. Trachea-midline.  Chest and Lung Exam Chest and lung exam reveals -on auscultation, normal breath sounds, no adventitious sounds and normal vocal resonance.  Cardiovascular Cardiovascular examination reveals -normal heart sounds, regular rate and rhythm with no murmurs.  Abdomen Inspection Inspection of the abdomen reveals - No Hernias. Palpation/Percussion Palpation and Percussion of the abdomen reveal - Soft, Non Tender, No Rigidity (guarding), No hepatosplenomegaly and No Palpable abdominal masses.  Rectal Anorectal Exam External - Note: R buttock lesion.  Neurologic Neurologic evaluation reveals -alert and oriented x 3 with no impairment of recent or remote memory, normal attention span and ability to concentrate, normal sensation and normal coordination.  Musculoskeletal Normal Exam - Bilateral-Upper  Extremity Strength Normal and Lower Extremity Strength Normal.   Results Leighton Ruff MD; 99991111 11:16 AM) Procedures  Name Value Date ANOSCOPY, DIAGNOSTIC [id number]) [ Hemorrhoids ] Procedure Other: Procedure: Anoscopy....Marland KitchenMarland KitchenSurgeon: Marcello Moores....Marland KitchenMarland KitchenAfter the risks and benefits were explained, verbal consent was obtained for above procedure. A medical assistant chaperone was present thoroughout the entire procedure. ....Marland KitchenMarland KitchenAnesthesia: none....Marland KitchenMarland KitchenDiagnosis: abnormal imaging....Marland KitchenMarland KitchenFindings: grade 2 hemorrhoids in left lateral and right anterior positions, moderately inflamed. no masses noted, no internal lesions.  Performed: 06/05/2019 11:15 AM    Assessment & Plan Leighton Ruff MD; 99991111 11:15 AM)  SQUAMOUS CELL CARCINOMA IN SITU (SCCIS) (D09.9) Impression: 85 year old female who presents to the office for evaluation of a squamous cell carcinoma in situ lesion biopsied by her dermatologist. Deep margin was positive. She was seen by Dr. Rush Farmer and a computed tomography scan showed some thickening in the anal canal. She was sent to me for further evaluation. on anoscopy today I do not see any signs of anal lesions or masses. She does have some grade 2 internal hemorrhoids. I have recommended excisional biopsy of the lesion to clear her margins. We have discussed risk of wound infection, bleeding, need for additional surgery.

## 2019-06-05 NOTE — H&P (Signed)
History of Present Illness Leighton Ruff MD; 99991111 11:00 AM) The patient is a 85 year old female who presents with a skin neoplasm. This patient is referred by Dr. Cindie Crumbly for a squamous cell cancer on the right buttock. She has had a skin lesion draining in that area for about a year. She also has had a change in bowel function. She underwent a shave biopsy of the lesion showing at least squamous cell cancer in situ. CT Abd and pelvis shows no sign of metastatic disease.   Problem List/Past Medical Leighton Ruff, MD; 99991111 11:01 AM) SQUAMOUS CELL CANCER OF SKIN OF BUTTOCK [id number])  Past Surgical History Leighton Ruff, MD; 99991111 11:01 AM) Cataract Surgery Bilateral. Hysterectomy (not due to cancer) - Complete Knee Surgery Right. Spinal Surgery - Lower Back Spinal Surgery - Neck  Diagnostic Studies History Leighton Ruff, MD; 99991111 11:01 AM) Colonoscopy >10 years ago Mammogram >3 years ago Pap Smear >5 years ago  Allergies Nance Pew, CMA; 06/05/2019 10:42 AM) Ciprofloxacin *CHEMICALS* CODEINE Nausea, Vomiting. Dilaudid *ANALGESICS - OPIOID* Nausea, Vomiting. HYDROmorphone HCl *ANALGESICS - OPIOID* Mysoline *ANTICONVULSANTS* Nausea. oxyCODONE HCl *ANALGESICS - OPIOID* Allergies Reconciled  Medication History (Sabrina Canty, CMA; 06/05/2019 10:42 AM) Losartan Potassium (50MG  Tablet, Oral) Active. Diclofenac Sodium (1% Gel, Transdermal) Active. HumaLOG Mix 75/25 KwikPen ((75-25) 100UNIT/ML Susp Pen-inj, Subcutaneous) Active. Myrbetriq (50MG  Tablet ER 24HR, Oral) Active. Propranolol HCl (10MG  Tablet, Oral) Active. Cefdinir (300MG  Capsule, Oral) Active. Picato (0.015% Gel, External) Active. Oxybutynin Chloride ER (10MG  Tablet ER 24HR, Oral) Active. Lidocaine (5% Patch, External) Active. Medications Reconciled  Social History Leighton Ruff, MD; 99991111 11:01 AM) No alcohol use No caffeine use No drug use Tobacco  use Never smoker.  Family History Leighton Ruff, MD; 99991111 11:01 AM) Arthritis Daughter, Son. Cerebrovascular Accident Father. Diabetes Mellitus Brother, Son. Hypertension Brother, Sister, Son. Melanoma Sister. Migraine Headache Daughter. Thyroid problems Daughter.  Pregnancy / Birth History Leighton Ruff, MD; 99991111 11:01 AM) Age of menopause <47 Gravida 3 Irregular periods Maternal age 54-30 Para 3  Other Problems Leighton Ruff, MD; 99991111 11:01 AM) Arthritis Back Pain Bladder Problems Diabetes Mellitus High blood pressure Melanoma Oophorectomy Bilateral. Umbilical Hernia Repair     Review of Systems Leighton Ruff MD; 99991111 11:01 AM) General Not Present- Appetite Loss, Chills, Fatigue, Fever, Night Sweats, Weight Gain and Weight Loss. Skin Not Present- Change in Wart/Mole, Dryness, Hives, Jaundice, New Lesions, Non-Healing Wounds, Rash and Ulcer. HEENT Present- Seasonal Allergies and Wears glasses/contact lenses. Not Present- Earache, Hearing Loss, Hoarseness, Nose Bleed, Oral Ulcers, Ringing in the Ears, Sinus Pain, Sore Throat, Visual Disturbances and Yellow Eyes. Breast Not Present- Breast Mass, Breast Pain, Nipple Discharge and Skin Changes. Cardiovascular Present- Swelling of Extremities. Not Present- Chest Pain, Difficulty Breathing Lying Down, Leg Cramps, Palpitations, Rapid Heart Rate and Shortness of Breath. Gastrointestinal Present- Chronic diarrhea. Not Present- Abdominal Pain, Bloating, Bloody Stool, Change in Bowel Habits, Constipation, Difficulty Swallowing, Excessive gas, Gets full quickly at meals, Hemorrhoids, Indigestion, Nausea, Rectal Pain and Vomiting. Female Genitourinary Present- Nocturia. Not Present- Frequency, Painful Urination, Pelvic Pain and Urgency. Musculoskeletal Present- Joint Pain. Not Present- Back Pain, Joint Stiffness, Muscle Pain, Muscle Weakness and Swelling of Extremities. Neurological Present-  Decreased Memory, Tremor and Trouble walking. Not Present- Fainting, Headaches, Numbness, Seizures, Tingling and Weakness. Psychiatric Not Present- Anxiety, Bipolar, Change in Sleep Pattern, Depression, Fearful and Frequent crying. Endocrine Present- Heat Intolerance. Not Present- Cold Intolerance, Excessive Hunger, Hair Changes, Hot flashes and New Diabetes. Hematology Present- Easy Bruising. Not Present- Blood  Thinners, Excessive bleeding, Gland problems, HIV and Persistent Infections.  Vitals (Sabrina Canty CMA; 06/05/2019 10:43 AM) 06/05/2019 10:43 AM Weight: 224.38 lb Height: 69in Body Surface Area: 2.17 m Body Mass Index: 33.13 kg/m  Temp.: 97.110F(Temporal)  Pulse: 96 (Regular)  P.OX: 96% (Room air) BP: 180/90 (Sitting, Left Arm, Standard)        Physical Exam Leighton Ruff MD; 99991111 11:11 AM)  General Mental Status-Alert. General Appearance-Not in acute distress. Build & Nutrition-Well nourished. Posture-Normal posture. Gait-Normal.  Head and Neck Head-normocephalic, atraumatic with no lesions or palpable masses. Trachea-midline.  Chest and Lung Exam Chest and lung exam reveals -on auscultation, normal breath sounds, no adventitious sounds and normal vocal resonance.  Cardiovascular Cardiovascular examination reveals -normal heart sounds, regular rate and rhythm with no murmurs.  Abdomen Inspection Inspection of the abdomen reveals - No Hernias. Palpation/Percussion Palpation and Percussion of the abdomen reveal - Soft, Non Tender, No Rigidity (guarding), No hepatosplenomegaly and No Palpable abdominal masses.  Rectal Anorectal Exam External - Note: R buttock lesion.  Neurologic Neurologic evaluation reveals -alert and oriented x 3 with no impairment of recent or remote memory, normal attention span and ability to concentrate, normal sensation and normal coordination.  Musculoskeletal Normal Exam - Bilateral-Upper  Extremity Strength Normal and Lower Extremity Strength Normal.   Results Leighton Ruff MD; 99991111 11:16 AM) Procedures  Name Value Date ANOSCOPY, DIAGNOSTIC [id number]) [ Hemorrhoids ] Procedure Other: Procedure: Anoscopy....Marland KitchenMarland KitchenSurgeon: Marcello Moores....Marland KitchenMarland KitchenAfter the risks and benefits were explained, verbal consent was obtained for above procedure. A medical assistant chaperone was present thoroughout the entire procedure. ....Marland KitchenMarland KitchenAnesthesia: none....Marland KitchenMarland KitchenDiagnosis: abnormal imaging....Marland KitchenMarland KitchenFindings: grade 2 hemorrhoids in left lateral and right anterior positions, moderately inflamed. no masses noted, no internal lesions.  Performed: 06/05/2019 11:15 AM    Assessment & Plan Leighton Ruff MD; 99991111 11:15 AM)  SQUAMOUS CELL CARCINOMA IN SITU (SCCIS) (D09.9) Impression: 85 year old female who presents to the office for evaluation of a squamous cell carcinoma in situ lesion biopsied by her dermatologist. Deep margin was positive. She was seen by Dr. Rush Farmer and a computed tomography scan showed some thickening in the anal canal. She was sent to me for further evaluation. on anoscopy today I do not see any signs of anal lesions or masses. She does have some grade 2 internal hemorrhoids. I have recommended excisional biopsy of the lesion to clear her margins. We have discussed risk of wound infection, bleeding, need for additional surgery.

## 2019-06-07 ENCOUNTER — Encounter (HOSPITAL_BASED_OUTPATIENT_CLINIC_OR_DEPARTMENT_OTHER): Payer: Self-pay | Admitting: *Deleted

## 2019-06-07 ENCOUNTER — Other Ambulatory Visit: Payer: Self-pay

## 2019-06-07 NOTE — Progress Notes (Addendum)
Spoke with patient and daughter via telephone for pre op interview. No solids after MN before surgery, may have clear liquids until 0715 AM of surgery. Verbalized understanding of clear liquid diet and no milk products. Arrival time 1115.   Case discussed with Konrad Felix, PA due to patients Hgb A1C being elevated and sugars running between 150-250. Robertson for patient to be done at surgery center. Notified patients daughter as well that if blood sugar is 250 or greater on DOS surgery could potentially be cancelled, and that anesthesia recommended patient follow up with Dr Loanne Drilling as per his previous note from 02/2019. Patients daughter verbalized understanding.

## 2019-06-12 ENCOUNTER — Other Ambulatory Visit (HOSPITAL_COMMUNITY)
Admission: RE | Admit: 2019-06-12 | Discharge: 2019-06-12 | Disposition: A | Discharge status: Home / Self Care | Payer: Medicare Other | Source: Ambulatory Visit | Attending: General Surgery | Admitting: General Surgery

## 2019-06-12 DIAGNOSIS — Z01812 Encounter for preprocedural laboratory examination: Secondary | ICD-10-CM | POA: Diagnosis present

## 2019-06-12 DIAGNOSIS — Z20828 Contact with and (suspected) exposure to other viral communicable diseases: Secondary | ICD-10-CM | POA: Insufficient documentation

## 2019-06-13 LAB — NOVEL CORONAVIRUS, NAA (HOSP ORDER, SEND-OUT TO REF LAB; TAT 18-24 HRS): SARS-CoV-2, NAA: NOT DETECTED

## 2019-06-14 NOTE — Progress Notes (Signed)
Received call back from pt's daughter Juliann Pulse, whom voice message left previously, via phone about surgery time change.  Daughter confirmed and aware for pt to arrive at 0700.

## 2019-06-15 ENCOUNTER — Ambulatory Visit (HOSPITAL_BASED_OUTPATIENT_CLINIC_OR_DEPARTMENT_OTHER): Payer: Medicare Other | Admitting: Physician Assistant

## 2019-06-15 ENCOUNTER — Encounter (HOSPITAL_BASED_OUTPATIENT_CLINIC_OR_DEPARTMENT_OTHER)
Admission: RE | Disposition: A | Discharge status: Home / Self Care | Payer: Self-pay | Source: Home / Self Care | Attending: General Surgery

## 2019-06-15 ENCOUNTER — Ambulatory Visit (HOSPITAL_BASED_OUTPATIENT_CLINIC_OR_DEPARTMENT_OTHER)
Admission: RE | Admit: 2019-06-15 | Discharge: 2019-06-15 | Disposition: A | Discharge status: Home / Self Care | Payer: Medicare Other | Attending: General Surgery | Admitting: General Surgery

## 2019-06-15 ENCOUNTER — Other Ambulatory Visit: Payer: Self-pay

## 2019-06-15 ENCOUNTER — Encounter (HOSPITAL_BASED_OUTPATIENT_CLINIC_OR_DEPARTMENT_OTHER): Payer: Self-pay

## 2019-06-15 DIAGNOSIS — Z79899 Other long term (current) drug therapy: Secondary | ICD-10-CM | POA: Insufficient documentation

## 2019-06-15 DIAGNOSIS — I10 Essential (primary) hypertension: Secondary | ICD-10-CM | POA: Diagnosis not present

## 2019-06-15 DIAGNOSIS — E119 Type 2 diabetes mellitus without complications: Secondary | ICD-10-CM | POA: Insufficient documentation

## 2019-06-15 DIAGNOSIS — Z794 Long term (current) use of insulin: Secondary | ICD-10-CM | POA: Diagnosis not present

## 2019-06-15 DIAGNOSIS — D045 Carcinoma in situ of skin of trunk: Secondary | ICD-10-CM | POA: Insufficient documentation

## 2019-06-15 HISTORY — PX: MASS EXCISION: SHX2000

## 2019-06-15 LAB — GLUCOSE, CAPILLARY
Glucose-Capillary: 106 mg/dL — ABNORMAL HIGH (ref 70–99)
Glucose-Capillary: 117 mg/dL — ABNORMAL HIGH (ref 70–99)

## 2019-06-15 SURGERY — EXCISION MASS
Anesthesia: Monitor Anesthesia Care | Site: Buttocks | Laterality: Right

## 2019-06-15 MED ORDER — ACETAMINOPHEN 325 MG PO TABS
650.0000 mg | ORAL_TABLET | ORAL | Status: DC | PRN
  Filled 2019-06-15: qty 2

## 2019-06-15 MED ORDER — PROPOFOL 500 MG/50ML IV EMUL
INTRAVENOUS | Status: AC
  Filled 2019-06-15: qty 50

## 2019-06-15 MED ORDER — ACETAMINOPHEN 500 MG PO TABS
ORAL_TABLET | ORAL | Status: AC
  Filled 2019-06-15: qty 2

## 2019-06-15 MED ORDER — LACTATED RINGERS IV SOLN
INTRAVENOUS | Status: DC
  Administered 2019-06-15: 08:00:00 1000 mL via INTRAVENOUS
  Filled 2019-06-15: qty 1000

## 2019-06-15 MED ORDER — BUPIVACAINE-EPINEPHRINE 0.5% -1:200000 IJ SOLN
INTRAMUSCULAR | Status: DC | PRN
  Administered 2019-06-15: 20 mL

## 2019-06-15 MED ORDER — SODIUM CHLORIDE 0.9 % IV SOLN
250.0000 mL | INTRAVENOUS | Status: DC | PRN
  Filled 2019-06-15: qty 250

## 2019-06-15 MED ORDER — SODIUM CHLORIDE 0.9% FLUSH
3.0000 mL | INTRAVENOUS | Status: DC | PRN
  Filled 2019-06-15: qty 3

## 2019-06-15 MED ORDER — FENTANYL CITRATE (PF) 100 MCG/2ML IJ SOLN
25.0000 ug | INTRAMUSCULAR | Status: DC | PRN
  Filled 2019-06-15: qty 1

## 2019-06-15 MED ORDER — FENTANYL CITRATE (PF) 100 MCG/2ML IJ SOLN
INTRAMUSCULAR | Status: DC | PRN
  Administered 2019-06-15 (×6): 6.25 ug via INTRAVENOUS
  Administered 2019-06-15: 12.5 ug via INTRAVENOUS

## 2019-06-15 MED ORDER — FENTANYL CITRATE (PF) 100 MCG/2ML IJ SOLN
INTRAMUSCULAR | Status: AC
  Filled 2019-06-15: qty 2

## 2019-06-15 MED ORDER — SODIUM CHLORIDE 0.9% FLUSH
3.0000 mL | Freq: Two times a day (BID) | INTRAVENOUS | Status: DC
  Filled 2019-06-15: qty 3

## 2019-06-15 MED ORDER — ACETAMINOPHEN 650 MG RE SUPP
650.0000 mg | RECTAL | Status: DC | PRN
  Filled 2019-06-15: qty 1

## 2019-06-15 MED ORDER — MEPERIDINE HCL 25 MG/ML IJ SOLN
6.2500 mg | INTRAMUSCULAR | Status: DC | PRN
  Filled 2019-06-15: qty 1

## 2019-06-15 MED ORDER — ONDANSETRON HCL 4 MG/2ML IJ SOLN
INTRAMUSCULAR | Status: DC | PRN
  Administered 2019-06-15: 4 mg via INTRAVENOUS

## 2019-06-15 MED ORDER — ONDANSETRON HCL 4 MG/2ML IJ SOLN
4.0000 mg | Freq: Once | INTRAMUSCULAR | Status: DC | PRN
  Filled 2019-06-15: qty 2

## 2019-06-15 MED ORDER — PROPOFOL 10 MG/ML IV BOLUS
INTRAVENOUS | Status: DC | PRN
  Administered 2019-06-15: 10 mg via INTRAVENOUS

## 2019-06-15 MED ORDER — LIDOCAINE HCL (CARDIAC) PF 100 MG/5ML IV SOSY
PREFILLED_SYRINGE | INTRAVENOUS | Status: DC | PRN
  Administered 2019-06-15: 20 mg via INTRAVENOUS

## 2019-06-15 MED ORDER — ACETAMINOPHEN 500 MG PO TABS
1000.0000 mg | ORAL_TABLET | ORAL | Status: AC
  Administered 2019-06-15: 1000 mg via ORAL
  Filled 2019-06-15: qty 2

## 2019-06-15 MED ORDER — ONDANSETRON HCL 4 MG/2ML IJ SOLN
INTRAMUSCULAR | Status: AC
  Filled 2019-06-15: qty 2

## 2019-06-15 MED ORDER — LIDOCAINE 2% (20 MG/ML) 5 ML SYRINGE
INTRAMUSCULAR | Status: AC
  Filled 2019-06-15: qty 5

## 2019-06-15 MED ORDER — PROPOFOL 500 MG/50ML IV EMUL
INTRAVENOUS | Status: DC | PRN
  Administered 2019-06-15: 25 ug/kg/min via INTRAVENOUS

## 2019-06-15 SURGICAL SUPPLY — 46 items
BENZOIN TINCTURE PRP APPL 2/3 (GAUZE/BANDAGES/DRESSINGS) IMPLANT
BLADE CLIPPER SENSICLIP SURGIC (BLADE) IMPLANT
BLADE EXTENDED COATED 6.5IN (ELECTRODE) IMPLANT
BLADE SURG 10 STRL SS (BLADE) ×2 IMPLANT
CHLORAPREP W/TINT 26ML (MISCELLANEOUS) ×1 IMPLANT
COVER BACK TABLE 60X90IN (DRAPES) ×2 IMPLANT
COVER MAYO STAND STRL (DRAPES) ×2 IMPLANT
COVER WAND RF STERILE (DRAPES) ×2 IMPLANT
DECANTER SPIKE VIAL GLASS SM (MISCELLANEOUS) IMPLANT
DERMABOND ADVANCED (GAUZE/BANDAGES/DRESSINGS) ×1
DERMABOND ADVANCED .7 DNX12 (GAUZE/BANDAGES/DRESSINGS) IMPLANT
DRAPE LAPAROTOMY 100X72 PEDS (DRAPES) ×2 IMPLANT
DRAPE UTILITY XL STRL (DRAPES) ×2 IMPLANT
DRSG TEGADERM 4X4.75 (GAUZE/BANDAGES/DRESSINGS) IMPLANT
ELECT REM PT RETURN 9FT ADLT (ELECTROSURGICAL) ×2
ELECTRODE REM PT RTRN 9FT ADLT (ELECTROSURGICAL) ×1 IMPLANT
GAUZE SPONGE 4X4 12PLY STRL (GAUZE/BANDAGES/DRESSINGS) ×2 IMPLANT
GLOVE BIO SURGEON STRL SZ 6.5 (GLOVE) ×4 IMPLANT
GLOVE BIOGEL PI IND STRL 7.0 (GLOVE) ×1 IMPLANT
GLOVE BIOGEL PI INDICATOR 7.0 (GLOVE) ×1
GOWN STRL REUS W/TWL 2XL LVL3 (GOWN DISPOSABLE) ×2 IMPLANT
KIT TURNOVER CYSTO (KITS) ×2 IMPLANT
NEEDLE HYPO 22GX1.5 SAFETY (NEEDLE) ×2 IMPLANT
NS IRRIG 500ML POUR BTL (IV SOLUTION) IMPLANT
PACK BASIN DAY SURGERY FS (CUSTOM PROCEDURE TRAY) ×2 IMPLANT
PAD ARMBOARD 7.5X6 YLW CONV (MISCELLANEOUS) IMPLANT
PENCIL BUTTON HOLSTER BLD 10FT (ELECTRODE) ×2 IMPLANT
STRIP CLOSURE SKIN 1/2X4 (GAUZE/BANDAGES/DRESSINGS) IMPLANT
SUT ETHILON 2 0 FS 18 (SUTURE) IMPLANT
SUT ETHILON 4 0 PS 2 18 (SUTURE) IMPLANT
SUT SILK 2 0 SH (SUTURE) IMPLANT
SUT VIC AB 2-0 SH 27 (SUTURE)
SUT VIC AB 2-0 SH 27XBRD (SUTURE) IMPLANT
SUT VIC AB 3-0 SH 18 (SUTURE) IMPLANT
SUT VIC AB 3-0 SH 8-18 (SUTURE) ×1 IMPLANT
SUT VIC AB 4-0 SH 18 (SUTURE) IMPLANT
SUT VICRYL 4-0 PS2 18IN ABS (SUTURE) IMPLANT
SYR BULB IRRIGATION 50ML (SYRINGE) ×2 IMPLANT
SYR CONTROL 10ML LL (SYRINGE) ×3 IMPLANT
TOWEL OR 17X26 10 PK STRL BLUE (TOWEL DISPOSABLE) ×3 IMPLANT
TRAY DSU PREP LF (CUSTOM PROCEDURE TRAY) IMPLANT
TUBE ANAEROBIC SPECIMEN COL (MISCELLANEOUS) IMPLANT
TUBE CONNECTING 12X1/4 (SUCTIONS) ×2 IMPLANT
UNDERPAD 30X30 (UNDERPADS AND DIAPERS) ×1 IMPLANT
WATER STERILE IRR 500ML POUR (IV SOLUTION) ×2 IMPLANT
YANKAUER SUCT BULB TIP NO VENT (SUCTIONS) ×2 IMPLANT

## 2019-06-15 NOTE — Interval H&P Note (Signed)
History and Physical Interval Note:  06/15/2019 7:35 AM  [name]  has presented today for surgery, with the diagnosis of squamous cell carcinoma in situ.  The various methods of treatment have been discussed with the patient and family. After consideration of risks, benefits and other options for treatment, the patient has consented to  Procedure(s): EXCISIONAL BIOPSY RIGHT BUTTOCK (Right) as a surgical intervention.  The patient's history has been reviewed, patient examined, no change in status, stable for surgery.  I have reviewed the patient's chart and labs.  Questions were answered to the patient's satisfaction.     Rosario Adie, MD  [facility]

## 2019-06-15 NOTE — Anesthesia Procedure Notes (Signed)
Procedure Name: MAC Date/Time: 06/15/2019 9:15 AM Performed by: Justice Rocher, CRNA Pre-anesthesia Checklist: Patient identified, Emergency Drugs available, Suction available, Patient being monitored and Timeout performed Patient Re-evaluated:Patient Re-evaluated prior to induction Oxygen Delivery Method: Simple face mask Preoxygenation: Pre-oxygenation with 100% oxygen Induction Type: IV induction Placement Confirmation: breath sounds checked- equal and bilateral and positive ETCO2

## 2019-06-15 NOTE — Transfer of Care (Signed)
Immediate Anesthesia Transfer of Care Note  Patient: [name]  Procedure(s) Performed: Procedure(s) (LRB): EXCISIONAL BIOPSY RIGHT BUTTOCK (Right)  Patient Location: PACU  Anesthesia Type: General  Level of Consciousness: awake, sedated, patient cooperative and responds to stimulation  Airway & Oxygen Therapy: Patient Spontanous Breathing and Patient connected to RA and soft FM   Post-op Assessment: Report given to PACU RN, Post -op Vital signs reviewed and stable and Patient moving all extremities  Post vital signs: Reviewed and stable  Complications: No apparent anesthesia complications

## 2019-06-15 NOTE — Discharge Instructions (Addendum)
GENERAL SURGERY: POST OP INSTRUCTIONS  1. DIET: Follow a light bland diet the first 24 hours after arrival home, such as soup, liquids, crackers, etc.  Be sure to include lots of fluids daily.  Avoid fast food or heavy meals as your are more likely to get nauseated.   2. Take your usually prescribed home medications unless otherwise directed. 3. PAIN CONTROL: a. Pain is best controlled by a usual combination of three different methods TOGETHER: i. Ice/Heat ii. Over the counter pain medication iii. Prescription pain medication b. Most patients will experience some swelling and bruising around the incisions.  Ice packs or heating pads (30-60 minutes up to 6 times a day) will help. Use ice for the first few days to help decrease swelling and bruising, then switch to heat to help relax tight/sore spots and speed recovery.  Some people prefer to use ice alone, heat alone, alternating between ice & heat.  Experiment to what works for you.  Swelling and bruising can take several weeks to resolve.   c. It is helpful to take an over-the-counter pain medication regularly for the first few weeks.  Choose one of the following that works best for you: i. Naproxen (Aleve, etc)  Two 220mg  tabs twice a day ii. Ibuprofen (Advil, etc) Three 200mg  tabs four times a day (every meal & bedtime) iii. Tylenol 650mg  every 4 hours 4. Avoid getting constipated.  Between the surgery and the pain medications, it is common to experience some constipation.  Increasing fluid intake and taking a fiber supplement (such as Metamucil, Citrucel, FiberCon, MiraLax, etc) 1-2 times a day regularly will usually help prevent this problem from occurring.  A mild laxative (prune juice, Milk of Magnesia, MiraLax, etc) should be taken according to package directions if there are no bowel movements after 48 hours.   5. Sit on a soft pillow for the first few days after surgery to avoid pressure to the area while it is healing. 6. Wash / shower  every day.  You may shower over the dressings as they are waterproof.  Continue to shower over incision(s) after the dressing is off. 7. You have skin glue over your incision.  It will peel off in 7-14 days.    8. ACTIVITIES as tolerated:   a. You may resume regular (light) daily activities beginning the next day--such as daily self-care, walking, climbing stairs--gradually increasing activities as tolerated.  If you can walk 30 minutes without difficulty, it is safe to try more intense activity such as jogging, treadmill, bicycling, low-impact aerobics, swimming, etc. b. Save the most intensive and strenuous activity for last such as sit-ups, heavy lifting, contact sports, etc  Refrain from any heavy lifting or straining until you are off narcotics for pain control.   c. DO NOT PUSH THROUGH PAIN.  Let pain be your guide: If it hurts to do something, don't do it.  Pain is your body warning you to avoid that activity for another week until the pain goes down. d. You may drive when you are no longer taking prescription pain medication, you can comfortably wear a seatbelt, and you can safely maneuver your car and apply brakes. e. Dennis Bast may have sexual intercourse when it is comfortable.  9. FOLLOW UP in our office a. Please call [facility] at [phone] to set up an appointment to see your surgeon in the office for a follow-up appointment approximately 2-3 weeks after your surgery. b. Make sure that you call for this appointment the  day you arrive home to insure a convenient appointment time. 9. IF YOU HAVE DISABILITY OR FAMILY LEAVE FORMS, BRING THEM TO THE OFFICE FOR PROCESSING.  DO NOT GIVE THEM TO YOUR DOCTOR.   WHEN TO CALL us [phone]: 1. Poor pain control 2. Reactions / problems with new medications (rash/itching, nausea, etc)  3. Fever over 101.5 F (38.5 C) 4. Worsening swelling or bruising 5. Continued bleeding from incision. 6. Increased pain, redness, or drainage from the  incision   The clinic staff is available to answer your questions during regular business hours (8:30am-5pm).  Please dont hesitate to call and ask to speak to one of our nurses for clinical concerns.   If you have a medical emergency, go to the nearest emergency room or call 911.  A surgeon from [facility] is always on call at the [facility], [place], [place], [place], [place]  [address] ? MAIN: [phone] ? TOLL FREE: [phone] ?  FAX [phone] [email or web address]    Post Anesthesia Home Care Instructions  Activity: Get plenty of rest for the remainder of the day. A responsible adult should stay with you for 24 hours following the procedure.  For the next 24 hours, DO NOT: -Drive a car -Paediatric nurse -Drink alcoholic beverages -Take any medication unless instructed by your physician -Make any legal decisions or sign important papers.  Meals: Start with liquid foods such as gelatin or soup. Progress to regular foods as tolerated. Avoid greasy, spicy, heavy foods. If nausea and/or vomiting occur, drink only clear liquids until the nausea and/or vomiting subsides. Call your physician if vomiting continues.  Special Instructions/Symptoms: Your throat may feel dry or sore from the anesthesia or the breathing tube placed in your throat during surgery. If this causes discomfort, gargle with warm salt water. The discomfort should disappear within 24 hours.  If you had a scopolamine patch placed behind your ear for the management of post- operative nausea and/or vomiting:  1. The medication in the patch is effective for 72 hours, after which it should be removed.  Wrap patch in a tissue and discard in the trash. Wash hands thoroughly with soap and water. 2. You may remove the patch earlier than 72 hours if you experience unpleasant side effects which may include dry mouth, dizziness or visual  disturbances. 3. Avoid touching the patch. Wash your hands with soap and water after contact with the patch.

## 2019-06-15 NOTE — Anesthesia Preprocedure Evaluation (Addendum)
Anesthesia Evaluation  Patient identified by MRN, date of birth, ID band Patient awake    Reviewed: Allergy & Precautions, NPO status , Patient's Chart, lab work & pertinent test results  History of Anesthesia Complications (+) PONV and history of anesthetic complications  Airway Mallampati: I       Dental no notable dental hx. (+) Teeth Intact   Pulmonary    Pulmonary exam normal breath sounds clear to auscultation       Cardiovascular hypertension, Pt. on medications Normal cardiovascular exam Rhythm:Regular Rate:Normal     Neuro/Psych    GI/Hepatic   Endo/Other  diabetes, Insulin Dependent  Renal/GU      Musculoskeletal   Abdominal Normal abdominal exam  (+)   Peds  Hematology   Anesthesia Other Findings   Reproductive/Obstetrics                            Anesthesia Physical Anesthesia Plan  ASA: II  Anesthesia Plan: MAC   Post-op Pain Management:    Induction:   PONV Risk Score and Plan: Propofol infusion  Airway Management Planned:   Additional Equipment: None  Intra-op Plan:   Post-operative Plan:   Informed Consent:   Plan Discussed with: CRNA  Anesthesia Plan Comments:         Anesthesia Quick Evaluation

## 2019-06-15 NOTE — Anesthesia Postprocedure Evaluation (Signed)
Anesthesia Post Note  Patient: [name]  Procedure(s) Performed: EXCISIONAL BIOPSY RIGHT BUTTOCK (Right Buttocks)     Patient location during evaluation: PACU Anesthesia Type: MAC Level of consciousness: awake Pain management: pain level controlled Vital Signs Assessment: post-procedure vital signs reviewed and stable Respiratory status: spontaneous breathing Cardiovascular status: stable Postop Assessment: no apparent nausea or vomiting Anesthetic complications: no    Last Vitals:  Vitals:   06/15/19 1000 06/15/19 1015  BP: (!) 160/78 (!) 151/72  Pulse: 66 63  Resp: 15 20  Temp:    SpO2: 95% 95%    Last Pain:  Vitals:   06/15/19 1030  TempSrc:   PainSc: 0-No pain   Pain Goal: Patients Stated Pain Goal: 7 (06/15/19 0828)                 Huston Foley

## 2019-06-15 NOTE — Op Note (Signed)
06/15/2019  9:42 AM  PATIENT:  [name]  85 y.o. female  Patient Care Team: Rankins, Bill Salinas, MD as PCP - General (Family Medicine) Adrian Prows, MD as Consulting Physician (Cardiology)  PRE-OPERATIVE DIAGNOSIS:  squamous cell carcinoma in situ  POST-OPERATIVE DIAGNOSIS:  squamous cell carcinoma in situ  PROCEDURE:  EXCISIONAL BIOPSY BUTTOCK   Surgeon(s): Leighton Ruff, MD  ASSISTANT: none   ANESTHESIA:   local and MAC  EBL: 46m Total I/O In: 300 [I.V.:300] Out: 5 [Blood:5]  DRAINS: none   SPECIMEN:  Source of Specimen:  R buttock lesion  DISPOSITION OF SPECIMEN:  PATHOLOGY  COUNTS:  YES  PLAN OF CARE: Discharge to home after PACU  PATIENT DISPOSITION:  PACU - hemodynamically stable.  INDICATION: 85y.o. F with biopsy proven SCC of the right buttock   OR FINDINGS: Right superior buttock lesion  DESCRIPTION: the patient was identified in the preoperative holding area and taken to the OR where they were laid prone on the operating room table.  MAC anesthesia was induced without difficulty. SCDs were also noted to be in place prior to the initiation of anesthesia.  The patient was then prepped and draped in the usual sterile fashion.   A surgical timeout was performed indicating the correct patient, procedure, positioning and need for preoperative antibiotics.   I began by defining the borders of the lesion.  I then marked a 5 mm border around this using a ruler marker.  I made this into a vertical elliptical incision.  An incision was made around this border using a 10 blade scalpel.  Dissection was carried down through to the subcutaneous fat.  The entire skin paddle was then removed at the dermal layer.  I mobilized the medial flap using electrocautery.  Hemostasis was good.  The dermal layer was then reapproximated using 3-0 Vicryl sutures.  The skin was closed using a running 4-0 Vicryl subcuticular suture and Dermabond.  Patient tolerated this well and was  sent to postanesthesia care unit in stable condition.  The specimen was marked with long stitch at the lateral border and short stitch at the superior border.  All counts were correct per operating room staff.

## 2019-06-16 ENCOUNTER — Encounter (HOSPITAL_BASED_OUTPATIENT_CLINIC_OR_DEPARTMENT_OTHER): Payer: Self-pay | Admitting: General Surgery

## 2019-06-16 LAB — SURGICAL PATHOLOGY

## 2019-06-17 ENCOUNTER — Other Ambulatory Visit: Payer: Self-pay | Admitting: Cardiology

## 2019-07-03 ENCOUNTER — Ambulatory Visit: Payer: Medicare Other | Admitting: Adult Health

## 2019-07-04 ENCOUNTER — Other Ambulatory Visit: Payer: Self-pay

## 2019-07-04 ENCOUNTER — Encounter (HOSPITAL_COMMUNITY): Payer: Self-pay | Admitting: Emergency Medicine

## 2019-07-04 ENCOUNTER — Emergency Department (HOSPITAL_COMMUNITY)
Admission: EM | Admit: 2019-07-04 | Discharge: 2019-07-04 | Disposition: A | Discharge status: Home / Self Care | Payer: Medicare Other | Attending: Emergency Medicine | Admitting: Emergency Medicine

## 2019-07-04 ENCOUNTER — Emergency Department (HOSPITAL_COMMUNITY): Payer: Medicare Other

## 2019-07-04 DIAGNOSIS — N183 Chronic kidney disease, stage 3 unspecified: Secondary | ICD-10-CM | POA: Insufficient documentation

## 2019-07-04 DIAGNOSIS — R319 Hematuria, unspecified: Secondary | ICD-10-CM

## 2019-07-04 DIAGNOSIS — E1122 Type 2 diabetes mellitus with diabetic chronic kidney disease: Secondary | ICD-10-CM | POA: Insufficient documentation

## 2019-07-04 DIAGNOSIS — Z79899 Other long term (current) drug therapy: Secondary | ICD-10-CM | POA: Insufficient documentation

## 2019-07-04 DIAGNOSIS — R1032 Left lower quadrant pain: Secondary | ICD-10-CM | POA: Insufficient documentation

## 2019-07-04 DIAGNOSIS — Z794 Long term (current) use of insulin: Secondary | ICD-10-CM | POA: Diagnosis not present

## 2019-07-04 DIAGNOSIS — I129 Hypertensive chronic kidney disease with stage 1 through stage 4 chronic kidney disease, or unspecified chronic kidney disease: Secondary | ICD-10-CM | POA: Diagnosis not present

## 2019-07-04 DIAGNOSIS — R109 Unspecified abdominal pain: Secondary | ICD-10-CM

## 2019-07-04 LAB — COMPREHENSIVE METABOLIC PANEL
ALT: 13 U/L (ref 0–44)
AST: 19 U/L (ref 15–41)
Albumin: 3.4 g/dL — ABNORMAL LOW (ref 3.5–5.0)
Alkaline Phosphatase: 64 U/L (ref 38–126)
Anion gap: 11 (ref 5–15)
BUN: 22 mg/dL (ref 8–23)
CO2: 22 mmol/L (ref 22–32)
Calcium: 8.9 mg/dL (ref 8.9–10.3)
Chloride: 103 mmol/L (ref 98–111)
Creatinine, Ser: 0.9 mg/dL (ref 0.44–1.00)
GFR calc Af Amer: >60 mL/min (ref >60)
GFR calc non Af Amer: 59 mL/min — ABNORMAL LOW (ref >60)
Glucose, Bld: 268 mg/dL — ABNORMAL HIGH (ref 70–99)
Potassium: 4.2 mmol/L (ref 3.5–5.1)
Sodium: 136 mmol/L (ref 135–145)
Total Bilirubin: 0.7 mg/dL (ref 0.3–1.2)
Total Protein: 6.4 g/dL — ABNORMAL LOW (ref 6.5–8.1)

## 2019-07-04 LAB — URINALYSIS, ROUTINE W REFLEX MICROSCOPIC
Bilirubin Urine: NEGATIVE
Glucose, UA: >=500 mg/dL — AB
Ketones, ur: NEGATIVE mg/dL
Leukocytes,Ua: NEGATIVE
Nitrite: NEGATIVE
Protein, ur: 100 mg/dL — AB
Specific Gravity, Urine: 1.022 (ref 1.005–1.030)
pH: 5 (ref 5.0–8.0)

## 2019-07-04 LAB — CBC WITH DIFFERENTIAL/PLATELET
Abs Immature Granulocytes: 0.01 10*3/uL (ref 0.00–0.07)
Basophils Absolute: 0 10*3/uL (ref 0.0–0.1)
Basophils Relative: 1 %
Eosinophils Absolute: 0.2 10*3/uL (ref 0.0–0.5)
Eosinophils Relative: 3 %
HCT: 40.7 % (ref 36.0–46.0)
Hemoglobin: 13.1 g/dL (ref 12.0–15.0)
Immature Granulocytes: 0 %
Lymphocytes Relative: 25 %
Lymphs Abs: 1.7 10*3/uL (ref 0.7–4.0)
MCH: 28.2 pg (ref 26.0–34.0)
MCHC: 32.2 g/dL (ref 30.0–36.0)
MCV: 87.5 fL (ref 80.0–100.0)
Monocytes Absolute: 0.5 10*3/uL (ref 0.1–1.0)
Monocytes Relative: 8 %
Neutro Abs: 4.2 10*3/uL (ref 1.7–7.7)
Neutrophils Relative %: 63 %
Platelets: 188 10*3/uL (ref 150–400)
RBC: 4.65 MIL/uL (ref 3.87–5.11)
RDW: 12.8 % (ref 11.5–15.5)
WBC: 6.6 10*3/uL (ref 4.0–10.5)
nRBC: 0 % (ref 0.0–0.2)

## 2019-07-04 LAB — PROTIME-INR
INR: 1.1 (ref 0.8–1.2)
Prothrombin Time: 13.7 seconds (ref 11.4–15.2)

## 2019-07-04 LAB — CBG MONITORING, ED: Glucose-Capillary: 196 mg/dL — ABNORMAL HIGH (ref 70–99)

## 2019-07-04 MED ORDER — FENTANYL CITRATE (PF) 100 MCG/2ML IJ SOLN
50.0000 ug | Freq: Once | INTRAMUSCULAR | Status: AC
  Administered 2019-07-04: 50 ug via INTRAVENOUS
  Filled 2019-07-04: qty 2

## 2019-07-04 NOTE — Discharge Instructions (Signed)
Your work-up today did show evidence of the blood in your urine called hematuria.  We did not see convincing evidence of infection nor was there evidence of kidney stone on the imaging.  Your other labs were overall reassuring.  We feel you are safe for discharge home to follow-up with your urologist for further evaluation management.  Please maintain hydration and rest.  If any symptoms change or worsen, please return to nearest emergency department.

## 2019-07-04 NOTE — ED Notes (Signed)
Patient Alert and oriented to baseline. Stable and ambulatory to baseline. Patient verbalized understanding of the discharge instructions.  Patient belongings were taken by the patient.   

## 2019-07-04 NOTE — ED Notes (Signed)
Juliann Pulse (Daughter [phone]) called/would like a call back for update.

## 2019-07-04 NOTE — ED Notes (Signed)
CBG 196 

## 2019-07-04 NOTE — ED Provider Notes (Signed)
[facility] EMERGENCY DEPARTMENT Provider Note   CSN: [id number] Arrival date & time: 07/04/19  0701     History   Chief Complaint Chief Complaint  Patient presents with  . Hematuria    HPI [name] is a 85 y.o. female.     The history is provided by the patient and medical records. No language interpreter was used.  Hematuria This is a new problem. The current episode started 12 to 24 hours ago. The problem occurs constantly. The problem has not changed since onset.Associated symptoms include abdominal pain (left flank pain). Pertinent negatives include no chest pain, no headaches and no shortness of breath. Nothing aggravates the symptoms. Nothing relieves the symptoms. She has tried nothing for the symptoms. The treatment provided no relief.    Past Medical History:  Diagnosis Date  . Anemia    as a teenager  . Arthritis   . Cancer ([place])    skin  . Cholelithiasis   . Chronic insomnia   . Chronic low back pain   . Complication of anesthesia   . DDD (degenerative disc disease), lumbar   . Diabetes mellitus    Iddm x 12 years  . Diabetic peripheral neuropathy ([place])   . Essential tremor 04/01/2015  . GERD (gastroesophageal reflux disease)   . Hx of acute renal failure 02/2014   admitted to [facility]  . Hx: UTI (urinary tract infection) 02/2014  . Hyperlipemia   . Hypertension   . Memory disorder 08/06/2017  . Obesity   . Peripheral edema   . Pneumonia    as a child  . PONV (postoperative nausea and vomiting)    pt states Zofran not that helpful in the past, Phenergan works best for her  . Staph infection    1970's  . Umbilical hernia   . Wrist fracture    left October 2014    Patient Active Problem List   Diagnosis Date Noted  . Memory disorder 08/06/2017  . Left ventricular diastolic dysfunction with preserved systolic function [id number]  . Closed compression fracture of L1 lumbar vertebra 05/17/2017  . Intractable back pain  05/17/2017  . Diabetes ([place]) 05/17/2017  . GERD (gastroesophageal reflux disease) 05/17/2017  . Hypertension 05/17/2017  . Hyperlipemia 05/17/2017  . Back pain 05/15/2017  . Syncope 01/05/2017  . Tachycardia 01/04/2017  . CKD (chronic kidney disease) stage 3, GFR 30-59 ml/min 01/04/2017  . Diabetes mellitus with complication ([place]) 123XX123  . Peripheral neuropathy 01/04/2017  . Orthostatic hypotension 01/04/2017  . Fall 12/24/2016  . Acute encephalopathy 12/24/2016  . Depression 12/24/2016  . Acute renal failure superimposed on stage 3 chronic kidney disease ([place]) 12/24/2016  . Vertigo 05/16/2016  . Chest pain 05/15/2016  . Tremor, essential 04/01/2015  . Chronic low back pain 04/01/2015  . Lumbar herniated disc 02/06/2015  . Lumbar radiculopathy 03/20/2014  . UTI (lower urinary tract infection) 03/18/2014  . Hyperglycemia 03/18/2014  . Dyslipidemia 03/18/2014  . Lumbar stenosis 03/16/2014  . HTN (hypertension) 02/12/2014  . Diabetes mellitus ([place]) 02/12/2014  . Acute renal failure ([place]) 02/11/2014  . H/O total knee replacement 08/11/2013  . Gallstones 10/01/2011    Past Surgical History:  Procedure Laterality Date  . ABDOMINAL HYSTERECTOMY    . BACK SURGERY  2005  . CARDIAC CATHETERIZATION  2000   Dr Einar Gip  . CATARACT EXTRACTION Bilateral    w/ lens implants  . COLONOSCOPY  >10 years  . EYE SURGERY    . HERNIA  REPAIR    . IR KYPHO LUMBAR INC FX REDUCE BONE BX UNI/BIL CANNULATION INC/IMAGING  05/18/2017  . IR KYPHO THORACIC WITH BONE BIOPSY  07/01/2017  . IR RADIOLOGIST EVAL & MGMT  06/14/2017  . IR RADIOLOGIST EVAL & MGMT  09/09/2017  . LUMBAR LAMINECTOMY/DECOMPRESSION MICRODISCECTOMY Left 03/16/2014   Procedure: LUMBAR LAMINECTOMY/DECOMPRESSION MICRODISCECTOMY 1 LEVEL  lumbar level three/four;  Surgeon: [name], MD;  Location: [facility];  Service: Neurosurgery;  Laterality: Left;  . LUMBAR LAMINECTOMY/DECOMPRESSION MICRODISCECTOMY Right 02/06/2015   Procedure:  Right L4-5 Microdiskectomy;  Surgeon: Leeroy Cha, MD;  Location: [facility];  Service: Neurosurgery;  Laterality: Right;  Right L4-5 Microdiskectomy  . MASS EXCISION Right 06/15/2019   Procedure: EXCISIONAL BIOPSY RIGHT BUTTOCK;  Surgeon: Leighton Ruff, MD;  Location: [facility];  Service: General;  Laterality: Right;  . NECK SURGERY    . OOPHORECTOMY    . TOTAL KNEE ARTHROPLASTY Right      OB History   No obstetric history on file.      Home Medications    Prior to Admission medications   Medication Sig Start Date End Date Taking? Authorizing Provider  acetaminophen (TYLENOL) 325 MG tablet Take 2 tablets (650 mg total) by mouth every 6 (six) hours as needed for mild pain (or Fever >/= 101). 05/19/17   Hosie Poisson, MD  Diclofenac Sodium CR 100 MG 24 hr tablet Take 1 tablet (100 mg total) by mouth daily. 06/20/18   Palumbo, April, MD  DULoxetine (CYMBALTA) 20 MG capsule TAKE 1 CAPSULE BY MOUTH TWICE A DAY 02/14/19   Kathrynn Ducking, MD  glucose blood test strip Use as instructed 07/08/18   Renato Shin, MD  Insulin Lispro Prot & Lispro (HUMALOG MIX 75/25 KWIKPEN) (75-25) 100 UNIT/ML Kwikpen 75 units with breakfast, and 10 units with supper 02/13/19   Renato Shin, MD  losartan (COZAAR) 50 MG tablet Take 0.5 tablets (25 mg total) by mouth daily. 03/21/19   Adrian Prows, MD  Menthol, Topical Analgesic, (BLUE-EMU MAXIMUM STRENGTH) 2.5 % LIQD Apply 1 application topically 2 (two) times daily as needed.    [provider]  MICROLET LANCETS MISC Use to monitor glucose levels BID 07/22/18   Renato Shin, MD  midodrine (PROAMATINE) 5 MG tablet Take 5 mg by mouth daily. 05/03/17   [provider]  Needles & Syringes MISC 1 Package by Does not apply route 2 (two) times daily with a meal. 10/26/18   Renato Shin, MD  propranolol (INDERAL) 10 MG tablet TAKE 1 TABLET (10 MG TOTAL) BY MOUTH 4 (FOUR) TIMES DAILY. 06/19/19   Adrian Prows, MD    Family History Family  History  Problem Relation Age of Onset  . Cancer Brother        prostate  . Heart disease Brother   . Alzheimer's disease Brother   . Diabetes Brother   . Stroke Mother   . Hypertension Mother   . COPD Brother   . Cancer Brother        Prostate cancer  . Hypertension Sister        Congestive heart failure  . Diabetes Sister   . Heart attack Son     Social History Social History   Tobacco Use  . Smoking status: Never Smoker  . Smokeless tobacco: Never Used  Substance Use Topics  . Alcohol use: No  . Drug use: No     Allergies   Ciprofloxacin, Codeine, Dilaudid [hydromorphone hcl], Hydromorphone, Mysoline [primidone],  and Oxycodone   Review of Systems Review of Systems  Constitutional: Negative for chills, diaphoresis, fatigue and fever.  HENT: Negative for congestion.   Respiratory: Negative for cough, chest tightness, shortness of breath and wheezing.   Cardiovascular: Negative for chest pain, palpitations and leg swelling.  Gastrointestinal: Positive for abdominal pain (left flank pain). Negative for blood in stool, constipation, diarrhea, nausea and vomiting.  Genitourinary: Positive for flank pain and hematuria. Negative for dysuria and frequency.  Musculoskeletal: Negative for back pain, neck pain and neck stiffness.  Skin: Positive for wound (recent bipsy site). Negative for rash.  Neurological: Negative for light-headedness, numbness and headaches.  Psychiatric/Behavioral: Negative for agitation.  All other systems reviewed and are negative.    Physical Exam Updated Vital Signs BP (!) 181/81 (BP Location: Right Arm)   Pulse 100   Temp 98.2 F (36.8 C) (Oral)   Resp 17   SpO2 99%   Physical Exam Vitals signs and nursing note reviewed.  Constitutional:      General: She is not in acute distress.    Appearance: She is well-developed. She is not ill-appearing, toxic-appearing or diaphoretic.  HENT:     Head: Normocephalic and atraumatic.     Right  Ear: External ear normal.     Left Ear: External ear normal.     Nose: Nose normal. No congestion or rhinorrhea.     Mouth/Throat:     Pharynx: No oropharyngeal exudate.  Eyes:     Conjunctiva/sclera: Conjunctivae normal.     Pupils: Pupils are equal, round, and reactive to light.  Neck:     Musculoskeletal: Normal range of motion and neck supple. No muscular tenderness.  Cardiovascular:     Rate and Rhythm: Normal rate.     Heart sounds: No murmur.  Pulmonary:     Effort: No respiratory distress.     Breath sounds: No stridor. No wheezing, rhonchi or rales.  Chest:     Chest wall: No tenderness.  Abdominal:     General: Abdomen is flat. Bowel sounds are normal. There is no distension.     Tenderness: There is no abdominal tenderness. There is no right CVA tenderness, left CVA tenderness, guarding or rebound.       Comments: Left flank tenderness.  No other abdominal tenderness.  Musculoskeletal:        General: Tenderness present.       Back:  Skin:    General: Skin is warm.     Capillary Refill: Capillary refill takes less than 2 seconds.     Findings: No erythema or rash.  Neurological:     Mental Status: She is alert and oriented to person, place, and time.     Motor: No abnormal muscle tone.     Coordination: Coordination normal.     Deep Tendon Reflexes: Reflexes are normal and symmetric.  Psychiatric:        Mood and Affect: Mood normal.      ED Treatments / Results  Labs (all labs ordered are listed, but only abnormal results are displayed) Labs Reviewed  URINALYSIS, ROUTINE W REFLEX MICROSCOPIC - Abnormal; Notable for the following components:      Result Value   APPearance HAZY (*)    Glucose, UA >=500 (*)    Hgb urine dipstick SMALL (*)    Protein, ur 100 (*)    Bacteria, UA RARE (*)    All other components within normal limits  COMPREHENSIVE METABOLIC PANEL - Abnormal; Notable for  the following components:   Glucose, Bld 268 (*)    Total Protein  6.4 (*)    Albumin 3.4 (*)    GFR calc non Af Amer 59 (*)    All other components within normal limits  CBG MONITORING, ED - Abnormal; Notable for the following components:   Glucose-Capillary 196 (*)    All other components within normal limits  URINE CULTURE  CBC WITH DIFFERENTIAL/PLATELET  PROTIME-INR    EKG None  Radiology Ct Renal Stone Study  Result Date: 07/04/2019 CLINICAL DATA:  Hematuria and flank pain today. EXAM: CT ABDOMEN AND PELVIS WITHOUT CONTRAST TECHNIQUE: Multidetector CT imaging of the abdomen and pelvis was performed following the standard protocol without IV contrast. COMPARISON:  June 01, 2019 FINDINGS: Lower chest: No acute abnormality. Hepatobiliary: Calcified granuloma is identified in the liver. No focal liver lesion is identified. There is a gallstone in the gallbladder. No inflammation is noted around gallbladder. The biliary tree is normal. Pancreas: Small focal calcification is identified in the pancreatic head unchanged. Previously noted cyst in the tail the pancreas is not as well appreciated on this noncontrast exam. Spleen: Normal in size without focal abnormality. Adrenals/Urinary Tract: Adrenal glands are unremarkable. Kidneys are normal, without renal calculi, focal lesion, or hydronephrosis. Bladder is unremarkable. Stomach/Bowel: Stomach is within normal limits. Appendix appears normal. No evidence of bowel wall thickening, distention, or inflammatory changes. There is diverticulosis of colon without diverticulitis. Vascular/Lymphatic: Aortic atherosclerosis. No enlarged abdominal or pelvic lymph nodes. Reproductive: Status post hysterectomy. No adnexal masses. Other: Umbilical herniation of mesenteric fat is identified. Musculoskeletal: Degenerative joint changes of the spine are identified. Patient status post prior vertebroplasty unchanged. IMPRESSION: No nephrolithiasis or hydronephrosis identified bilaterally. No acute abnormality identified in the  abdomen and pelvis. Electronically Signed   By: Abelardo Diesel M.D.   On: 07/04/2019 18:33    Procedures Procedures (including critical care time)  Medications Ordered in ED Medications  fentaNYL (SUBLIMAZE) injection 50 mcg (50 mcg Intravenous Given 07/04/19 1755)     Initial Impression / Assessment and Plan / ED Course  I have reviewed the triage vital signs and the nursing notes.  Pertinent labs & imaging results that were available during my care of the patient were reviewed by me and considered in my medical decision making (see chart for details).        [name] is a 85 y.o. female with a past medical history significant for hypertension, diabetes, prior renal failure, prior urinary tract infections, back surgery, CHF, and recent mass excision on her low back who presents with left flank pain and hematuria.  Patient reports that she had hematuria today when urinating and had a large amount of blood in the vault.  She does not think there was bleeding from her excision site and she is having no pain from her back.  She is complaining of left flank pain which is worsened recently.  She says she is had flank pain on and off for "a long time" but it is in her left flank and worsened with hematuria.  She is unclear if she has had hematuria in the past but denies any known history of kidney stones.  She denies recent trauma aside from a fall several months ago.  She has no recent fevers, chills, chest pain, shortness of breath, nausea, vomiting, constipation, or diarrhea.  She is primarily concerned about the hematuria.  On exam, lungs are clear and chest is nontender.  Abdomen is nontender.  Patient does have some left flank tenderness.  No CVA tenderness.  Normal bowel sounds.  Chaperone was present and her excision site was examined.  It had opened but there was no bleeding, erythema, or tenderness.  No induration or evidence of infection.  Exam otherwise unremarkable.  Clinically I  am concerned either urinary tract infection, idiopathic hematuria, or kidney stone causing her symptoms.  Due to the left flank pain, will get stone study to further evaluate as well as the labs.  Will get urinalysis to look for infection.  Patient requested pain medication will be given for the left leg discomfort.  Chart view shows that she has tolerated fentanyl in the past.  Anticipate reassessment after work-up.  7:22 PM Patient's work-up again to return.  Urinalysis shows blood as the patient was concerned about but does not show is of infection with no nitrites or leukocytes.  Culture was sent.  Other labs otherwise reassuring.  Patient CT scan does not show any stones or other significant abnormalities.   Patient was informed of need to follow-up with her urologist for further evaluation of the hematuria and she would be called if there was evidence of infection.  Patient was feeling well and agreed for discharge home.  Patient will follow up with urology and return precautions.  Patient discharged in good condition with family.   Final Clinical Impressions(s) / ED Diagnoses   Final diagnoses:  Hematuria, unspecified type  Left flank pain    ED Discharge Orders    None      Clinical Impression: 1. Hematuria, unspecified type   2. Left flank pain     Disposition: Discharge  Condition: Good  I have discussed the results, Dx and Tx plan with the pt(& family if present). He/she/they expressed understanding and agree(s) with the plan. Discharge instructions discussed at great length. Strict return precautions discussed and pt &/or family have verbalized understanding of the instructions. No further questions at time of discharge.    New Prescriptions   No medications on file    Follow Up: [facility] [place]       Taylon Coole, Gwenyth Allegra, MD 07/04/19 [phone]

## 2019-07-04 NOTE — ED Triage Notes (Signed)
Patient reports hematuria this morning , denies dysuria or fever .

## 2019-07-05 LAB — URINE CULTURE

## 2019-09-18 NOTE — Progress Notes (Deleted)
PATIENT: [name] DOB: August 13, 1933  REASON FOR VISIT: follow up HISTORY FROM: patient  HISTORY OF PRESENT ILLNESS: Today 09/18/19 [name] is an 86 year old female with history of a chronic gait disorder.  She has not been seen in the office since October 2019. She remains on Cymbalta for neuropathy.  She was in the ER in July 2020 for mechanical fall.  HISTORY 06/27/2018 Dr. Jannifer Franklin: [name] is an 86 year old right-handed white female with a history of a chronic gait disorder, the patient uses a walker for ambulation.  The patient fell and went to the emergency room on 20 June 2018.  The patient was in her bathroom, she was washing her face and then stood up and then fell backwards.  The patient underwent a CT scan of the brain and cervical spine that were unremarkable.  This is the only fall that she has had since seen last in April 2019.  The patient has a mild memory disorder that she believes has been quite stable.  The patient does not operate a motor vehicle, she does very little cooking, she claims that she has good and bad days with the tremor.  The patient has been cut back on the midodrine, she is taking 5 mg in the morning, she may be quite hypertensive at times.  The patient returns to this office for an evaluation.  The patient takes Cymbalta for the neuropathy, she believes that this is adequate for her.  She remains on propranolol for the tremor.  REVIEW OF SYSTEMS: Out of a complete 14 system review of symptoms, the patient complains only of the following symptoms, and all other reviewed systems are negative.  ALLERGIES: Allergies  Allergen Reactions  . Ciprofloxacin Other (See Comments)    Acute kidney failure  . Codeine Nausea And Vomiting  . Dilaudid [Hydromorphone Hcl] Nausea And Vomiting and Other (See Comments)    Severe sweating  . Hydromorphone   . Mysoline [Primidone] Nausea Only  . Oxycodone Nausea And Vomiting    HOME MEDICATIONS: Outpatient  Medications Prior to Visit  Medication Sig Dispense Refill  . acetaminophen (TYLENOL) 325 MG tablet Take 2 tablets (650 mg total) by mouth every 6 (six) hours as needed for mild pain (or Fever >/= 101).    . Diclofenac Sodium CR 100 MG 24 hr tablet Take 1 tablet (100 mg total) by mouth daily. 10 tablet 0  . DULoxetine (CYMBALTA) 20 MG capsule TAKE 1 CAPSULE BY MOUTH TWICE A DAY 180 capsule 2  . glucose blood test strip Use as instructed 200 each 12  . Insulin Lispro Prot & Lispro (HUMALOG MIX 75/25 KWIKPEN) (75-25) 100 UNIT/ML Kwikpen 75 units with breakfast, and 10 units with supper 10 pen 11  . losartan (COZAAR) 50 MG tablet Take 0.5 tablets (25 mg total) by mouth daily. 90 tablet 3  . Menthol, Topical Analgesic, (BLUE-EMU MAXIMUM STRENGTH) 2.5 % LIQD Apply 1 application topically 2 (two) times daily as needed.    [name] MICROLET LANCETS MISC Use to monitor glucose levels BID 200 each 1  . midodrine (PROAMATINE) 5 MG tablet Take 5 mg by mouth daily.    . Needles & Syringes MISC 1 Package by Does not apply route 2 (two) times daily with a meal. 100 each 6  . propranolol (INDERAL) 10 MG tablet TAKE 1 TABLET (10 MG TOTAL) BY MOUTH 4 (FOUR) TIMES DAILY. 360 tablet 0   No facility-administered medications prior to visit.    PAST MEDICAL  HISTORY: Past Medical History:  Diagnosis Date  . Anemia    as a teenager  . Arthritis   . Cancer ([place])    skin  . Cholelithiasis   . Chronic insomnia   . Chronic low back pain   . Complication of anesthesia   . DDD (degenerative disc disease), lumbar   . Diabetes mellitus    Iddm x 12 years  . Diabetic peripheral neuropathy ([place])   . Essential tremor 04/01/2015  . GERD (gastroesophageal reflux disease)   . Hx of acute renal failure 02/2014   admitted to [facility]  . Hx: UTI (urinary tract infection) 02/2014  . Hyperlipemia   . Hypertension   . Memory disorder 08/06/2017  . Obesity   . Peripheral edema   . Pneumonia    as a child  . PONV  (postoperative nausea and vomiting)    pt states Zofran not that helpful in the past, Phenergan works best for her  . Staph infection    1970's  . Umbilical hernia   . Wrist fracture    left October 2014    PAST SURGICAL HISTORY: Past Surgical History:  Procedure Laterality Date  . ABDOMINAL HYSTERECTOMY    . BACK SURGERY  2005  . CARDIAC CATHETERIZATION  2000   Dr Einar Gip  . CATARACT EXTRACTION Bilateral    w/ lens implants  . COLONOSCOPY  >10 years  . EYE SURGERY    . HERNIA REPAIR    . IR KYPHO LUMBAR INC FX REDUCE BONE BX UNI/BIL CANNULATION INC/IMAGING  05/18/2017  . IR KYPHO THORACIC WITH BONE BIOPSY  07/01/2017  . IR RADIOLOGIST EVAL & MGMT  06/14/2017  . IR RADIOLOGIST EVAL & MGMT  09/09/2017  . LUMBAR LAMINECTOMY/DECOMPRESSION MICRODISCECTOMY Left 03/16/2014   Procedure: LUMBAR LAMINECTOMY/DECOMPRESSION MICRODISCECTOMY 1 LEVEL  lumbar level three/four;  Surgeon: [name], MD;  Location: [place];  Service: Neurosurgery;  Laterality: Left;  . LUMBAR LAMINECTOMY/DECOMPRESSION MICRODISCECTOMY Right 02/06/2015   Procedure: Right L4-5 Microdiskectomy;  Surgeon: Leeroy Cha, MD;  Location: [place] [facility];  Service: Neurosurgery;  Laterality: Right;  Right L4-5 Microdiskectomy  . MASS EXCISION Right 06/15/2019   Procedure: EXCISIONAL BIOPSY RIGHT BUTTOCK;  Surgeon: Leighton Ruff, MD;  Location: [facility];  Service: General;  Laterality: Right;  . NECK SURGERY    . OOPHORECTOMY    . TOTAL KNEE ARTHROPLASTY Right     FAMILY HISTORY: Family History  Problem Relation Age of Onset  . Cancer Brother        prostate  . Heart disease Brother   . Alzheimer's disease Brother   . Diabetes Brother   . Stroke Mother   . Hypertension Mother   . COPD Brother   . Cancer Brother        Prostate cancer  . Hypertension Sister        Congestive heart failure  . Diabetes Sister   . Heart attack Son     SOCIAL HISTORY: Social History   Socioeconomic History  .  Marital status: Widowed    Spouse name: Not on file  . Number of children: 3  . Years of education: 10  . Highest education level: Not on file  Occupational History  . Occupation: Retired   Tobacco Use  . Smoking status: Never Smoker  . Smokeless tobacco: Never Used  Substance and Sexual Activity  . Alcohol use: No  . Drug use: No  . Sexual activity: Not Currently  Other Topics Concern  .  Not on file  Social History Narrative   Patient lives at home alone.    Patient is widowed.    Patient has 3 children and 2 step children.    Patient is right handed.    Patient has a high school education and CNA certificate.     Patient is retired.    Patient does not drink caffeine.   Social Determinants of Health   Financial Resource Strain:   . Difficulty of Paying Living Expenses: Not on file  Food Insecurity:   . Worried About Charity fundraiser in the Last Year: Not on file  . Ran Out of Food in the Last Year: Not on file  Transportation Needs:   . Lack of Transportation (Medical): Not on file  . Lack of Transportation (Non-Medical): Not on file  Physical Activity:   . Days of Exercise per Week: Not on file  . Minutes of Exercise per Session: Not on file  Stress:   . Feeling of Stress : Not on file  Social Connections:   . Frequency of Communication with Friends and Family: Not on file  . Frequency of Social Gatherings with Friends and Family: Not on file  . Attends Religious Services: Not on file  . Active Member of Clubs or Organizations: Not on file  . Attends Archivist Meetings: Not on file  . Marital Status: Not on file  Intimate Partner Violence:   . Fear of Current or Ex-Partner: Not on file  . Emotionally Abused: Not on file  . Physically Abused: Not on file  . Sexually Abused: Not on file      PHYSICAL EXAM  There were no vitals filed for this visit. There is no height or weight on file to calculate BMI.  Generalized: Well developed, in no  acute distress   Neurological examination  Mentation: Alert oriented to time, place, history taking. Follows all commands speech and language fluent Cranial nerve II-XII: Pupils were equal round reactive to light. Extraocular movements were full, visual field were full on confrontational test. Facial sensation and strength were normal. Uvula tongue midline. Head turning and shoulder shrug  were normal and symmetric. Motor: The motor testing reveals 5 over 5 strength of all 4 extremities. Good symmetric motor tone is noted throughout.  Sensory: Sensory testing is intact to soft touch on all 4 extremities. No evidence of extinction is noted.  Coordination: Cerebellar testing reveals good finger-nose-finger and heel-to-shin bilaterally.  Gait and station: Gait is normal. Tandem gait is normal. Romberg is negative. No drift is seen.  Reflexes: Deep tendon reflexes are symmetric and normal bilaterally.   DIAGNOSTIC DATA (LABS, IMAGING, TESTING) - I reviewed patient records, labs, notes, testing and imaging myself where available.  Lab Results  Component Value Date   WBC 6.6 07/04/2019   HGB 13.1 07/04/2019   HCT 40.7 07/04/2019   MCV 87.5 07/04/2019   PLT 188 07/04/2019      Component Value Date/Time   NA 136 07/04/2019 0725   K 4.2 07/04/2019 0725   CL 103 07/04/2019 0725   CO2 22 07/04/2019 0725   GLUCOSE 268 (H) 07/04/2019 0725   BUN 22 07/04/2019 0725   CREATININE 0.90 07/04/2019 0725   CALCIUM 8.9 07/04/2019 0725   PROT 6.4 (L) 07/04/2019 0725   ALBUMIN 3.4 (L) 07/04/2019 0725   AST 19 07/04/2019 0725   ALT 13 07/04/2019 0725   ALKPHOS 64 07/04/2019 0725   BILITOT 0.7 07/04/2019 0725  GFRNONAA 59 (L) 07/04/2019 0725   GFRAA >60 07/04/2019 0725   Lab Results  Component Value Date   CHOL 152 05/16/2016   HDL 38 (L) 05/16/2016   LDLCALC 57 05/16/2016   TRIG 283 (H) 05/16/2016   CHOLHDL 4.0 05/16/2016   Lab Results  Component Value Date   HGBA1C 10.6 (A) 02/13/2019    Lab Results  Component Value Date   VITAMINB12 295 08/06/2017   Lab Results  Component Value Date   TSH 3.097 01/04/2017      ASSESSMENT AND PLAN 86 y.o. year old female  has a past medical history of Anemia, Arthritis, Cancer ([place]), Cholelithiasis, Chronic insomnia, Chronic low back pain, Complication of anesthesia, DDD (degenerative disc disease), lumbar, Diabetes mellitus, Diabetic peripheral neuropathy ([place]), Essential tremor (04/01/2015), GERD (gastroesophageal reflux disease), acute renal failure (02/2014), UTI (urinary tract infection) (02/2014), Hyperlipemia, Hypertension, Memory disorder (08/06/2017), Obesity, Peripheral edema, Pneumonia, PONV (postoperative nausea and vomiting), Staph infection, Umbilical hernia, and Wrist fracture. here with ***   I spent 15 minutes with the patient. 50% of this time was spent   Butler Denmark, [place], DNP 09/18/2019, 1:27 PM [facility] [address], [place],  [address] [phone]

## 2019-09-19 ENCOUNTER — Ambulatory Visit: Payer: Medicare Other | Admitting: Neurology

## 2019-09-19 ENCOUNTER — Encounter: Payer: Self-pay | Admitting: Neurology

## 2019-09-20 NOTE — Progress Notes (Signed)
PATIENT: [name] DOB: [birth year]  REASON FOR VISIT: follow up HISTORY FROM: patient  HISTORY OF PRESENT ILLNESS: Today 09/21/19  [name] is an 86 year old female with history of a chronic gait disorder.  She uses a walker for ambulation.  She also has a mild memory disorder.  She takes Cymbalta for neuropathy, and propanolol for tremor. She presents today for a visit with her daughter, reporting an increase in falls, reports she fell on Christmas, hit the left side of her head, since then, has reported dizziness when lying down, or when leaning forward.  She also fell on 09/16/2019.  She says when she leans forward, she feels spacey.  She lives alone, uses her walker to ambulate.  She denies any numbness or weakness in her arms or legs, or visual changes.  She does report that she feels her memory is not as good.  She denies any real headache, but she does report mild headache to the left side.  He has history of orthostatic hypotension, taking midodrine 5 mg daily.  She did not take her blood pressure medication today.  She also has essential tremor, in both hands.  She does not drive a car.  HISTORY 06/27/2018 Dr. Jannifer Franklin: [name] is an 86 year old right-handed white female with a history of a chronic gait disorder, the patient uses a walker for ambulation.  The patient fell and went to the emergency room on 20 June 2018.  The patient was in her bathroom, she was washing her face and then stood up and then fell backwards.  The patient underwent a CT scan of the brain and cervical spine that were unremarkable.  This is the only fall that she has had since seen last in April 2019.  The patient has a mild memory disorder that she believes has been quite stable.  The patient does not operate a motor vehicle, she does very little cooking, she claims that she has good and bad days with the tremor.  The patient has been cut back on the midodrine, she is taking 5 mg in the morning, she may be  quite hypertensive at times.  The patient returns to this office for an evaluation.  The patient takes Cymbalta for the neuropathy, she believes that this is adequate for her.  She remains on propranolol for the tremor.  REVIEW OF SYSTEMS: Out of a complete 14 system review of symptoms, the patient complains only of the following symptoms, and all other reviewed systems are negative.  Dizziness, falls  ALLERGIES: Allergies  Allergen Reactions  . Ciprofloxacin Other (See Comments)    Acute kidney failure  . Codeine Nausea And Vomiting  . Dilaudid [Hydromorphone Hcl] Nausea And Vomiting and Other (See Comments)    Severe sweating  . Hydromorphone   . Mysoline [Primidone] Nausea Only  . Oxycodone Nausea And Vomiting    HOME MEDICATIONS: Outpatient Medications Prior to Visit  Medication Sig Dispense Refill  . acetaminophen (TYLENOL) 325 MG tablet Take 2 tablets (650 mg total) by mouth every 6 (six) hours as needed for mild pain (or Fever >/= 101).    . Diclofenac Sodium CR 100 MG 24 hr tablet Take 1 tablet (100 mg total) by mouth daily. 10 tablet 0  . DULoxetine (CYMBALTA) 20 MG capsule TAKE 1 CAPSULE BY MOUTH TWICE A DAY 180 capsule 2  . glucose blood test strip Use as instructed 200 each 12  . Insulin Lispro Prot & Lispro (HUMALOG MIX 75/25 KWIKPEN) (75-25)  100 UNIT/ML Kwikpen 75 units with breakfast, and 10 units with supper 10 pen 11  . losartan (COZAAR) 50 MG tablet Take 0.5 tablets (25 mg total) by mouth daily. 90 tablet 3  . MICROLET LANCETS MISC Use to monitor glucose levels BID 200 each 1  . Needles & Syringes MISC 1 Package by Does not apply route 2 (two) times daily with a meal. 100 each 6  . propranolol (INDERAL) 10 MG tablet TAKE 1 TABLET (10 MG TOTAL) BY MOUTH 4 (FOUR) TIMES DAILY. 360 tablet 0  . Menthol, Topical Analgesic, (BLUE-EMU MAXIMUM STRENGTH) 2.5 % LIQD Apply 1 application topically 2 (two) times daily as needed.    . midodrine (PROAMATINE) 5 MG tablet Take 5 mg by  mouth daily.     No facility-administered medications prior to visit.    PAST MEDICAL HISTORY: Past Medical History:  Diagnosis Date  . Anemia    as a teenager  . Arthritis   . Cancer ([place])    skin  . Cholelithiasis   . Chronic insomnia   . Chronic low back pain   . Complication of anesthesia   . DDD (degenerative disc disease), lumbar   . Diabetes mellitus    Iddm x 12 years  . Diabetic peripheral neuropathy ([place])   . Essential tremor 04/01/2015  . GERD (gastroesophageal reflux disease)   . Hx of acute renal failure 02/2014   admitted to [facility]  . Hx: UTI (urinary tract infection) 02/2014  . Hyperlipemia   . Hypertension   . Memory disorder 08/06/2017  . Obesity   . Peripheral edema   . Pneumonia    as a child  . PONV (postoperative nausea and vomiting)    pt states Zofran not that helpful in the past, Phenergan works best for her  . Staph infection    1970's  . Umbilical hernia   . Wrist fracture    left October 2014    PAST SURGICAL HISTORY: Past Surgical History:  Procedure Laterality Date  . ABDOMINAL HYSTERECTOMY    . BACK SURGERY  2005  . CARDIAC CATHETERIZATION  2000   Dr Einar Gip  . CATARACT EXTRACTION Bilateral    w/ lens implants  . COLONOSCOPY  >10 years  . EYE SURGERY    . HERNIA REPAIR    . IR KYPHO LUMBAR INC FX REDUCE BONE BX UNI/BIL CANNULATION INC/IMAGING  05/18/2017  . IR KYPHO THORACIC WITH BONE BIOPSY  07/01/2017  . IR RADIOLOGIST EVAL & MGMT  06/14/2017  . IR RADIOLOGIST EVAL & MGMT  09/09/2017  . LUMBAR LAMINECTOMY/DECOMPRESSION MICRODISCECTOMY Left 03/16/2014   Procedure: LUMBAR LAMINECTOMY/DECOMPRESSION MICRODISCECTOMY 1 LEVEL  lumbar level three/four;  Surgeon: [name], MD;  Location: [place];  Service: Neurosurgery;  Laterality: Left;  . LUMBAR LAMINECTOMY/DECOMPRESSION MICRODISCECTOMY Right 02/06/2015   Procedure: Right L4-5 Microdiskectomy;  Surgeon: Leeroy Cha, MD;  Location: [place] [facility];  Service: Neurosurgery;   Laterality: Right;  Right L4-5 Microdiskectomy  . MASS EXCISION Right 06/15/2019   Procedure: EXCISIONAL BIOPSY RIGHT BUTTOCK;  Surgeon: Leighton Ruff, MD;  Location: [facility];  Service: General;  Laterality: Right;  . NECK SURGERY    . OOPHORECTOMY    . TOTAL KNEE ARTHROPLASTY Right     FAMILY HISTORY: Family History  Problem Relation Age of Onset  . Cancer Brother        prostate  . Heart disease Brother   . Alzheimer's disease Brother   . Diabetes Brother   .  Stroke Mother   . Hypertension Mother   . COPD Brother   . Cancer Brother        Prostate cancer  . Hypertension Sister        Congestive heart failure  . Diabetes Sister   . Heart attack Son     SOCIAL HISTORY: Social History   Socioeconomic History  . Marital status: Widowed    Spouse name: Not on file  . Number of children: 3  . Years of education: 78  . Highest education level: Not on file  Occupational History  . Occupation: Retired   Tobacco Use  . Smoking status: Never Smoker  . Smokeless tobacco: Never Used  Substance and Sexual Activity  . Alcohol use: No  . Drug use: No  . Sexual activity: Not Currently  Other Topics Concern  . Not on file  Social History Narrative   Patient lives at home alone.    Patient is widowed.    Patient has 3 children and 2 step children.    Patient is right handed.    Patient has a high school education and CNA certificate.     Patient is retired.    Patient does not drink caffeine.   Social Determinants of Health   Financial Resource Strain:   . Difficulty of Paying Living Expenses: Not on file  Food Insecurity:   . Worried About Charity fundraiser in the Last Year: Not on file  . Ran Out of Food in the Last Year: Not on file  Transportation Needs:   . Lack of Transportation (Medical): Not on file  . Lack of Transportation (Non-Medical): Not on file  Physical Activity:   . Days of Exercise per Week: Not on file  . Minutes of Exercise  per Session: Not on file  Stress:   . Feeling of Stress : Not on file  Social Connections:   . Frequency of Communication with Friends and Family: Not on file  . Frequency of Social Gatherings with Friends and Family: Not on file  . Attends Religious Services: Not on file  . Active Member of Clubs or Organizations: Not on file  . Attends Archivist Meetings: Not on file  . Marital Status: Not on file  Intimate Partner Violence:   . Fear of Current or Ex-Partner: Not on file  . Emotionally Abused: Not on file  . Physically Abused: Not on file  . Sexually Abused: Not on file   PHYSICAL EXAM Vitals:   09/21/19 1314  BP: (!) 192/88  Pulse: 77  Temp: 97.6 F (36.4 C)  Weight: 229 lb (103.9 kg)  Height: 5\' 8"  (1.727 m)   Body mass index is 34.82 kg/m.  Generalized: Well developed, in no acute distress   Neurological examination  Mentation: Alert oriented to time, place, history taking. Follows all commands speech and language fluent Cranial nerve II-XII: Pupils were equal round reactive to light. Extraocular movements were full, visual field were full on confrontational test. Facial sensation and strength were normal.  Head turning and shoulder shrug  were normal and symmetric. Motor: The motor testing reveals 5 over 5 strength of all 4 extremities. Good symmetric motor tone is noted throughout.  Sensory: Sensory testing is intact to soft touch on all 4 extremities. No evidence of extinction is noted.  Coordination: Cerebellar testing reveals good finger-nose-finger and heel-to-shin bilaterally.  Gait and station: Has to push off from seated position to stand, take her time to stand, able  to use stool to get on exam table, gait is fairly wide-based, cautious, able to ambulate in room with standby assist Reflexes: Deep tendon reflexes are symmetric  DIAGNOSTIC DATA (LABS, IMAGING, TESTING) - I reviewed patient records, labs, notes, testing and imaging myself where  available.  Lab Results  Component Value Date   WBC 6.6 07/04/2019   HGB 13.1 07/04/2019   HCT 40.7 07/04/2019   MCV 87.5 07/04/2019   PLT 188 07/04/2019      Component Value Date/Time   NA 136 07/04/2019 0725   K 4.2 07/04/2019 0725   CL 103 07/04/2019 0725   CO2 22 07/04/2019 0725   GLUCOSE 268 (H) 07/04/2019 0725   BUN 22 07/04/2019 0725   CREATININE 0.90 07/04/2019 0725   CALCIUM 8.9 07/04/2019 0725   PROT 6.4 (L) 07/04/2019 0725   ALBUMIN 3.4 (L) 07/04/2019 0725   AST 19 07/04/2019 0725   ALT 13 07/04/2019 0725   ALKPHOS 64 07/04/2019 0725   BILITOT 0.7 07/04/2019 0725   GFRNONAA 59 (L) 07/04/2019 0725   GFRAA >60 07/04/2019 0725   Lab Results  Component Value Date   CHOL 152 05/16/2016   HDL 38 (L) 05/16/2016   LDLCALC 57 05/16/2016   TRIG 283 (H) 05/16/2016   CHOLHDL 4.0 05/16/2016   Lab Results  Component Value Date   HGBA1C 10.6 (A) 02/13/2019   Lab Results  Component Value Date   VITAMINB12 295 08/06/2017   Lab Results  Component Value Date   TSH 3.097 01/04/2017   ASSESSMENT AND PLAN 86 y.o. year old female  has a past medical history of Anemia, Arthritis, Cancer ([place]), Cholelithiasis, Chronic insomnia, Chronic low back pain, Complication of anesthesia, DDD (degenerative disc disease), lumbar, Diabetes mellitus, Diabetic peripheral neuropathy ([place]), Essential tremor (04/01/2015), GERD (gastroesophageal reflux disease), acute renal failure (02/2014), UTI (urinary tract infection) (02/2014), Hyperlipemia, Hypertension, Memory disorder (08/06/2017), Obesity, Peripheral edema, Pneumonia, PONV (postoperative nausea and vomiting), Staph infection, Umbilical hernia, and Wrist fracture. here with:  1.  Gait disorder, recent fall 2.  Dizziness, since recent fall 09/01/2019 3.  Peripheral neuropathy 4.  Orthostatic hypotension 5.  Essential tremor  She has not been seen at this office since October 2019.  She is here today complaining of dizziness when lying  down or leaning forward since a recent fall 09/01/2019, since then has had at least one more fall.  When she fell initially, she hit the left side of her head, no LOC, she is concerned about the dizziness.  She is not on any blood thinners.  Given the fall, with impact to her head, and subsequent dizziness, I think imaging is necessary.  I suggested CT head, the patient prefers MRI of the brain, feels this has been the best scan for her in the past.  I will place the order.  Her blood pressure was elevated today 192/88, but she did not take her blood pressure medication today.  She is to take her medication, monitor her blood pressure at home.  Her exam, is rather unremarkable, reviewing her chart, she has longstanding history of falls, and gait abnormality, want to rule out acute intracranial process given head trauma.  She will follow-up in 3 to 4 months with Dr. Jannifer Franklin.  I did advise her symptoms worsen or she develops any new symptoms she should let us know.  I spent 25 minutes with the patient. 50% of this time was spent discussing her plan of care.  Butler Denmark, AGNP-C, DNP 09/21/2019,  2:07 PM [facility] [address] [place], [place] [address] [phone]

## 2019-09-21 ENCOUNTER — Ambulatory Visit: Payer: Medicare Other | Admitting: Neurology

## 2019-09-21 ENCOUNTER — Encounter: Payer: Self-pay | Admitting: Neurology

## 2019-09-21 ENCOUNTER — Other Ambulatory Visit: Payer: Self-pay

## 2019-09-21 VITALS — BP 192/88 | HR 77 | Temp 97.6°F | Ht 68.0 in | Wt 229.0 lb

## 2019-09-21 DIAGNOSIS — G6289 Other specified polyneuropathies: Secondary | ICD-10-CM | POA: Diagnosis not present

## 2019-09-21 DIAGNOSIS — I951 Orthostatic hypotension: Secondary | ICD-10-CM

## 2019-09-21 DIAGNOSIS — R42 Dizziness and giddiness: Secondary | ICD-10-CM | POA: Diagnosis not present

## 2019-09-21 DIAGNOSIS — G250 Essential tremor: Secondary | ICD-10-CM

## 2019-09-21 NOTE — Patient Instructions (Signed)
I will order MRI of the brain, please take your BP medication as prescribed, please be very careful not to fall

## 2019-09-21 NOTE — Progress Notes (Signed)
I have read the note, and I agree with the clinical assessment and plan.  Shyne Resch K Breannah Kratt   

## 2019-10-23 ENCOUNTER — Other Ambulatory Visit: Payer: Medicare Other

## 2019-11-27 ENCOUNTER — Other Ambulatory Visit: Payer: Self-pay | Admitting: Endocrinology

## 2019-12-03 ENCOUNTER — Other Ambulatory Visit: Payer: Self-pay | Admitting: Endocrinology

## 2019-12-03 NOTE — Telephone Encounter (Signed)
1.  Please schedule f/u appt 2.  Then please refill x 1, pending that appt.  

## 2019-12-04 ENCOUNTER — Ambulatory Visit
Admission: RE | Admit: 2019-12-04 | Discharge: 2019-12-04 | Disposition: A | Discharge status: Home / Self Care | Payer: Medicare Other | Source: Ambulatory Visit | Attending: Neurology | Admitting: Neurology

## 2019-12-04 ENCOUNTER — Other Ambulatory Visit: Payer: Self-pay

## 2019-12-04 ENCOUNTER — Other Ambulatory Visit: Payer: Self-pay | Admitting: Endocrinology

## 2019-12-04 DIAGNOSIS — R42 Dizziness and giddiness: Secondary | ICD-10-CM | POA: Diagnosis not present

## 2019-12-05 ENCOUNTER — Telehealth: Payer: Self-pay | Admitting: *Deleted

## 2019-12-05 ENCOUNTER — Telehealth: Payer: Self-pay | Admitting: Neurology

## 2019-12-05 NOTE — Telephone Encounter (Signed)
Pt said [place], [place] called her.  Pt is now asking for a call back from her.

## 2019-12-05 NOTE — Telephone Encounter (Signed)
I spoke to pt and relayed her MRI results with no acute findings.  She is doing ok, she did have another fall, stumble on rug.  She did hit her head.  She is using walker.  Confirmed appt 413-21 with Dr. Jannifer Franklin.

## 2019-12-05 NOTE — Telephone Encounter (Signed)
LMVM for pt to speak to her after 1300.

## 2019-12-05 NOTE — Telephone Encounter (Signed)
See other note.  I gave her results.

## 2019-12-05 NOTE — Telephone Encounter (Signed)
-----   Message from Suzzanne Cloud, NP sent at 12/05/2019  6:10 AM EDT ----- MRI of the brain did not show acute findings related to dizziness, history of falls when last seen. Keep follow-up appointment with Dr. Jannifer Franklin in April.  IMPRESSION: This MRI of the brain without contrast shows the following: 1.   Mild generalized cortical atrophy, typical for age is stable compared to the 2017 MRI. 2.   Scattered T2/FLAIR hyperintense foci consistent with mild chronic microvascular ischemic change, mildly progressed compared to the 2017 MRI. 3.   The internal auditory canals and the mastoid air cells appear normal.   4.   There were no acute findings.

## 2019-12-07 ENCOUNTER — Other Ambulatory Visit: Payer: Self-pay

## 2019-12-07 ENCOUNTER — Telehealth: Payer: Self-pay | Admitting: Endocrinology

## 2019-12-07 MED ORDER — NEEDLES & SYRINGES MISC: 1.0000 | Freq: Two times a day (BID) | 6 refills | Status: AC

## 2019-12-07 NOTE — Telephone Encounter (Signed)
sent 

## 2019-12-07 NOTE — Telephone Encounter (Signed)
MEDICATION: Pen Needles  PHARMACY:  [facility] on [address]  IS THIS A 90 DAY SUPPLY :   IS PATIENT OUT OF MEDICATION: yes  IF NOT; HOW MUCH IS LEFT:   LAST APPOINTMENT DATE: @3 /29/2021  NEXT APPOINTMENT DATE:@4 /04/2020  DO WE HAVE YOUR PERMISSION TO LEAVE A DETAILED MESSAGE: yes  OTHER COMMENTS:    **Let patient know to contact pharmacy at the end of the day to make sure medication is ready. **  ** Please notify patient to allow 48-72 hours to process**  **Encourage patient to contact the pharmacy for refills or they can request refills through [facility]**

## 2019-12-14 ENCOUNTER — Ambulatory Visit: Payer: Medicare Other | Admitting: Endocrinology

## 2019-12-14 ENCOUNTER — Other Ambulatory Visit: Payer: Self-pay | Admitting: Endocrinology

## 2019-12-18 ENCOUNTER — Other Ambulatory Visit: Payer: Self-pay

## 2019-12-18 ENCOUNTER — Ambulatory Visit: Payer: Medicare Other | Admitting: Endocrinology

## 2019-12-18 ENCOUNTER — Encounter: Payer: Self-pay | Admitting: Endocrinology

## 2019-12-18 VITALS — BP 130/80 | HR 87 | Ht 68.0 in | Wt 228.0 lb

## 2019-12-18 DIAGNOSIS — Z794 Long term (current) use of insulin: Secondary | ICD-10-CM | POA: Diagnosis not present

## 2019-12-18 DIAGNOSIS — R230 Cyanosis: Secondary | ICD-10-CM | POA: Diagnosis not present

## 2019-12-18 DIAGNOSIS — E1151 Type 2 diabetes mellitus with diabetic peripheral angiopathy without gangrene: Secondary | ICD-10-CM

## 2019-12-18 LAB — POCT GLYCOSYLATED HEMOGLOBIN (HGB A1C): Hemoglobin A1C: 9.9 % — AB (ref 4.0–5.6)

## 2019-12-18 NOTE — Patient Instructions (Addendum)
Please fax or message [place] the blood sugar numbers. Then we'll increase the insulin if we safely can.  On this type of insulin schedule, you should eat meals on a regular schedule.  If a meal is missed or significantly delayed, your blood sugar could go low.   Let's check the circulation.  you will receive a phone call, about a day and time for an appointment.   check your blood sugar twice a day.  vary the time of day when you check, between before the 3 meals, and at bedtime.  also check if you have symptoms of your blood sugar being too high or too low.  please keep a record of the readings and bring it to your next appointment here (or you can bring the meter itself).  You can write it on any piece of paper.  please call us sooner if your blood sugar goes below 70, or if you have a lot of readings over 200.  Please come back for a follow-up appointment in 2 months.

## 2019-12-18 NOTE — Progress Notes (Signed)
Subjective:    Patient ID: Morgan [name], female    DOB: 1932/12/11, 86 y.o.   MRN: [id number]  HPI Pt returns for f/u of diabetes mellitus:  DM type: Insulin-requiring type 2 Dx'ed: [id number] Complications: polyneuropathy, renal insuff, and autonomic neuropathy.  Therapy: insulin since 2010 GDM: never DKA: never [name] hypoglycemia: never Pancreatitis: never.  Pancreatic imaging: normal on 2015 MRI.  Other: She lives in independent living, and takes her own insulin; however, she is not a candidate for multiple daily injections, due to memory loss; She eats meals at 7-10 AM, 2 PM, and 7-8 PM.   Interval history: no cbg record, and pt is not sure how cbg's are.  pt states she feels well in general.  Pt says she never misses the insulin.   Past Medical History:  Diagnosis Date  . Anemia    as a teenager  . Arthritis   . Cancer ([place])    skin  . Cholelithiasis   . Chronic insomnia   . Chronic low back pain   . Complication of anesthesia   . DDD (degenerative disc disease), lumbar   . Diabetes mellitus    Iddm x 12 years  . Diabetic peripheral neuropathy ([place])   . Essential tremor 04/01/2015  . GERD (gastroesophageal reflux disease)   . Hx of acute renal failure 02/2014   admitted to [facility]  . Hx: UTI (urinary tract infection) 02/2014  . Hyperlipemia   . Hypertension   . Memory disorder 08/06/2017  . Obesity   . Peripheral edema   . Pneumonia    as a child  . PONV (postoperative nausea and vomiting)    pt states Zofran not that helpful in the past, Phenergan works best for her  . Staph infection    1970's  . Umbilical hernia   . Wrist fracture    left October 2014    Past Surgical History:  Procedure Laterality Date  . ABDOMINAL HYSTERECTOMY    . BACK SURGERY  2005  . CARDIAC CATHETERIZATION  2000   Dr Einar Gip  . CATARACT EXTRACTION Bilateral    w/ lens implants  . COLONOSCOPY  >10 years  . EYE SURGERY    . HERNIA REPAIR    . IR KYPHO LUMBAR INC FX REDUCE BONE  BX UNI/BIL CANNULATION INC/IMAGING  05/18/2017  . IR KYPHO THORACIC WITH BONE BIOPSY  07/01/2017  . IR RADIOLOGIST EVAL & MGMT  06/14/2017  . IR RADIOLOGIST EVAL & MGMT  09/09/2017  . LUMBAR LAMINECTOMY/DECOMPRESSION MICRODISCECTOMY Left 03/16/2014   Procedure: LUMBAR LAMINECTOMY/DECOMPRESSION MICRODISCECTOMY 1 LEVEL  lumbar level three/four;  Surgeon: [name], MD;  Location: [facility];  Service: Neurosurgery;  Laterality: Left;  . LUMBAR LAMINECTOMY/DECOMPRESSION MICRODISCECTOMY Right 02/06/2015   Procedure: Right L4-5 Microdiskectomy;  Surgeon: Leeroy Cha, MD;  Location: [facility];  Service: Neurosurgery;  Laterality: Right;  Right L4-5 Microdiskectomy  . MASS EXCISION Right 06/15/2019   Procedure: EXCISIONAL BIOPSY RIGHT BUTTOCK;  Surgeon: Leighton Ruff, MD;  Location: [facility];  Service: General;  Laterality: Right;  . NECK SURGERY    . OOPHORECTOMY    . TOTAL KNEE ARTHROPLASTY Right     Social History   Socioeconomic History  . Marital status: Widowed    Spouse name: Not on file  . Number of children: 3  . Years of education: 47  . Highest education level: Not on file  Occupational History  . Occupation: Retired   Tobacco Use  .  Smoking status: Never Smoker  . Smokeless tobacco: Never Used  Substance and Sexual Activity  . Alcohol use: No  . Drug use: No  . Sexual activity: Not Currently  Other Topics Concern  . Not on file  Social History Narrative   Patient lives at home alone.    Patient is widowed.    Patient has 3 children and 2 step children.    Patient is right handed.    Patient has a high school education and CNA certificate.     Patient is retired.    Patient does not drink caffeine.   Social Determinants of Health   Financial Resource Strain:   . Difficulty of Paying Living Expenses:   Food Insecurity:   . Worried About Charity fundraiser in the Last Year:   . Arboriculturist in the Last Year:   Transportation Needs:   . Consulting civil engineer (Medical):   [name] Lack of Transportation (Non-Medical):   Physical Activity:   . Days of Exercise per Week:   . Minutes of Exercise per Session:   Stress:   . Feeling of Stress :   Social Connections:   . Frequency of Communication with Friends and Family:   . Frequency of Social Gatherings with Friends and Family:   . Attends Religious Services:   . Active Member of Clubs or Organizations:   . Attends Archivist Meetings:   [name] Marital Status:   Intimate Partner Violence:   . Fear of Current or Ex-Partner:   . Emotionally Abused:   [name] Physically Abused:   . Sexually Abused:     Current Outpatient Medications on File Prior to Visit  Medication Sig Dispense Refill  . acetaminophen (TYLENOL) 325 MG tablet Take 2 tablets (650 mg total) by mouth every 6 (six) hours as needed for mild pain (or Fever >/= 101).    . B-D ULTRAFINE III SHORT PEN 31G X 8 MM MISC 1 each by Other route 2 (two) times daily. E11.9 60 each 0  . Diclofenac Sodium CR 100 MG 24 hr tablet Take 1 tablet (100 mg total) by mouth daily. 10 tablet 0  . DULoxetine (CYMBALTA) 20 MG capsule TAKE 1 CAPSULE BY MOUTH TWICE A DAY 180 capsule 2  . glucose blood test strip Use as instructed 200 each 12  . Insulin Lispro Prot & Lispro (HUMALOG MIX 75/25 KWIKPEN) (75-25) 100 UNIT/ML Kwikpen 75 units with breakfast, and 10 units with supper 10 pen 11  . losartan (COZAAR) 50 MG tablet Take 0.5 tablets (25 mg total) by mouth daily. 90 tablet 3  . MICROLET LANCETS MISC Use to monitor glucose levels BID 200 each 1  . midodrine (PROAMATINE) 5 MG tablet Take 5 mg by mouth daily.    . Needles & Syringes MISC 1 Package by Does not apply route 2 (two) times daily with a meal. 100 each 6  . propranolol (INDERAL) 10 MG tablet TAKE 1 TABLET (10 MG TOTAL) BY MOUTH 4 (FOUR) TIMES DAILY. 360 tablet 0   No current facility-administered medications on file prior to visit.    Allergies  Allergen Reactions  . Ciprofloxacin  Other (See Comments)    Acute kidney failure  . Codeine Nausea And Vomiting  . Dilaudid [Hydromorphone Hcl] Nausea And Vomiting and Other (See Comments)    [name] sweating  . Hydromorphone   . Mysoline [Primidone] Nausea Only  . Oxycodone Nausea And Vomiting    Family History  Problem  Relation Age of Onset  . Cancer Brother        prostate  . Heart disease Brother   . Alzheimer's disease Brother   . Diabetes Brother   . Stroke Mother   . Hypertension Mother   . COPD Brother   . Cancer Brother        Prostate cancer  . Hypertension Sister        Congestive heart failure  . Diabetes Sister   . Heart attack Son     BP 130/80 (BP Location: Left Arm, Patient Position: Sitting, Cuff Size: Large)   Pulse 87   Ht 5\' 8"  (1.727 m)   Wt 228 lb (103.4 kg)   SpO2 99%   BMI 34.67 kg/m    Review of Systems She denies hypoglycemia    Objective:   Physical Exam VITAL SIGNS:  See vs page GENERAL: no distress Pulses: dorsalis pedis intact bilat.   MSK: no deformity of the feet CV: 1+ bilat leg edema, and vv's Skin:  no ulcer on the feet.  normal temp on the feet.  There is cyanosis of the toenails bilaterally.   Neuro: sensation is intact to touch on the feet, but decreased from normal.    Lab Results  Component Value Date   HGBA1C 9.9 (A) 12/18/2019   Lab Results  Component Value Date   CREATININE 0.90 07/04/2019   BUN 22 07/04/2019   NA 136 07/04/2019   K 4.2 07/04/2019   CL 103 07/04/2019   CO2 22 07/04/2019       Assessment & Plan:  Insulin-requiring type 2 DM, with PN: she needs increased rx noncompliance with cbg recording and f/u: I told pt we need cbg info in order to safely increase insulin.  Memory loss: she is not a candidate for multiple daily injections.    Patient Instructions  Please fax or message [place] the blood sugar numbers. Then we'll increase the insulin if we safely can.  On this type of insulin schedule, you should eat meals on a regular  schedule.  If a meal is missed or significantly delayed, your blood sugar could go low.   Let's check the circulation.  you will receive a phone call, about a day and time for an appointment.   check your blood sugar twice a day.  vary the time of day when you check, between before the 3 meals, and at bedtime.  also check if you have symptoms of your blood sugar being too high or too low.  please keep a record of the readings and bring it to your next appointment here (or you can bring the meter itself).  You can write it on any piece of paper.  please call us sooner if your blood sugar goes below 70, or if you have a lot of readings over 200.  Please come back for a follow-up appointment in 2 months.

## 2019-12-19 ENCOUNTER — Ambulatory Visit: Payer: Medicare Other | Admitting: Neurology

## 2019-12-19 ENCOUNTER — Encounter: Payer: Self-pay | Admitting: Neurology

## 2019-12-19 VITALS — BP 159/80 | HR 71 | Temp 97.1°F | Ht 68.0 in | Wt 225.5 lb

## 2019-12-19 DIAGNOSIS — G250 Essential tremor: Secondary | ICD-10-CM

## 2019-12-19 NOTE — Progress Notes (Signed)
Reason for visit: Essential tremor, gait disturbance  [name] is an 86 y.o. female  History of present illness:  [name] is an 86 year old right-handed white female with a history of an essential tremor, she is treated with low-dose propranolol taking 20 mg twice daily.  She was evaluated in January 2021 after a fall, MRI of the brain was done showing a mild to moderate level small vessel disease without acute changes.  Since the fall, she has had some positional vertigo when lying down at night and when sitting up in the morning.  The episodes last only a few seconds and then resolved, the patient believes that the severity of the problem has gradually been improving.  She had physical therapy for her walking 2 years ago.  She last fell about a week ago when her foot got caught in the rug, she usually uses a walker around the house and is safe with doing this.  The patient has a daughter with an essential tremor as well.  Past Medical History:  Diagnosis Date  . Anemia    as a teenager  . Arthritis   . Cancer ([place])    skin  . Cholelithiasis   . Chronic insomnia   . Chronic low back pain   . Complication of anesthesia   . DDD (degenerative disc disease), lumbar   . Diabetes mellitus    Iddm x 12 years  . Diabetic peripheral neuropathy ([place])   . Essential tremor 04/01/2015  . GERD (gastroesophageal reflux disease)   . Hx of acute renal failure 02/2014   admitted to [facility]  . Hx: UTI (urinary tract infection) 02/2014  . Hyperlipemia   . Hypertension   . Memory disorder 08/06/2017  . Obesity   . Peripheral edema   . Pneumonia    as a child  . PONV (postoperative nausea and vomiting)    pt states Zofran not that helpful in the past, Phenergan works best for her  . Staph infection    1970's  . Umbilical hernia   . Wrist fracture    left October 2014    Past Surgical History:  Procedure Laterality Date  . ABDOMINAL HYSTERECTOMY    . BACK SURGERY  2005  .  CARDIAC CATHETERIZATION  2000   Dr Einar Gip  . CATARACT EXTRACTION Bilateral    w/ lens implants  . COLONOSCOPY  >10 years  . EYE SURGERY    . HERNIA REPAIR    . IR KYPHO LUMBAR INC FX REDUCE BONE BX UNI/BIL CANNULATION INC/IMAGING  05/18/2017  . IR KYPHO THORACIC WITH BONE BIOPSY  07/01/2017  . IR RADIOLOGIST EVAL & MGMT  06/14/2017  . IR RADIOLOGIST EVAL & MGMT  09/09/2017  . LUMBAR LAMINECTOMY/DECOMPRESSION MICRODISCECTOMY Left 03/16/2014   Procedure: LUMBAR LAMINECTOMY/DECOMPRESSION MICRODISCECTOMY 1 LEVEL  lumbar level three/four;  Surgeon: [name], MD;  Location: [facility];  Service: Neurosurgery;  Laterality: Left;  . LUMBAR LAMINECTOMY/DECOMPRESSION MICRODISCECTOMY Right 02/06/2015   Procedure: Right L4-5 Microdiskectomy;  Surgeon: Leeroy Cha, MD;  Location: [place] [facility];  Service: Neurosurgery;  Laterality: Right;  Right L4-5 Microdiskectomy  . MASS EXCISION Right 06/15/2019   Procedure: EXCISIONAL BIOPSY RIGHT BUTTOCK;  Surgeon: Leighton Ruff, MD;  Location[facility];  Service: General;  Laterality: Right;  . NECK SURGERY    . OOPHORECTOMY    . TOTAL KNEE ARTHROPLASTY Right     Family History  Problem Relation Age of Onset  . Cancer  Brother        prostate  . Heart disease Brother   . Alzheimer's disease Brother   . Diabetes Brother   . Stroke Mother   . Hypertension Mother   . COPD Brother   . Cancer Brother        Prostate cancer  . Hypertension Sister        Congestive heart failure  . Diabetes Sister   . Heart attack Son     Social history:  reports that she has never smoked. She has never used smokeless tobacco. She reports that she does not drink alcohol or use drugs.    Allergies  Allergen Reactions  . Ciprofloxacin Other (See Comments)    Acute kidney failure  . Codeine Nausea And Vomiting  . Dilaudid [Hydromorphone Hcl] Nausea And Vomiting and Other (See Comments)    Severe sweating  . Hydromorphone   . Mysoline [Primidone] Nausea  Only  . Oxycodone Nausea And Vomiting    Medications:  Prior to Admission medications   Medication Sig Start Date End Date Taking? Authorizing Provider  acetaminophen (TYLENOL) 325 MG tablet Take 2 tablets (650 mg total) by mouth every 6 (six) hours as needed for mild pain (or Fever >/= 101). 05/19/17  Yes Hosie Poisson, MD  B-D ULTRAFINE III SHORT PEN 31G X 8 MM MISC 1 each by Other route 2 (two) times daily. E11.9 12/14/19  Yes Renato Shin, MD  Diclofenac Sodium CR 100 MG 24 hr tablet Take 1 tablet (100 mg total) by mouth daily. 06/20/18  Yes Palumbo, April, MD  DULoxetine (CYMBALTA) 20 MG capsule TAKE 1 CAPSULE BY MOUTH TWICE A DAY 02/14/19  Yes Kathrynn Ducking, MD  glucose blood test strip Use as instructed 07/08/18  Yes Renato Shin, MD  Insulin Lispro Prot & Lispro (HUMALOG MIX 75/25 KWIKPEN) (75-25) 100 UNIT/ML Kwikpen 75 units with breakfast, and 10 units with supper 02/13/19  Yes Renato Shin, MD  losartan (COZAAR) 50 MG tablet Take 0.5 tablets (25 mg total) by mouth daily. 03/21/19  Yes Adrian Prows, MD  MICROLET LANCETS MISC Use to monitor glucose levels BID 07/22/18  Yes Renato Shin, MD  midodrine (PROAMATINE) 5 MG tablet Take 5 mg by mouth daily. 05/03/17  Yes [provider]  Needles & Syringes MISC 1 Package by Does not apply route 2 (two) times daily with a meal. 12/07/19  Yes Renato Shin, MD  propranolol (INDERAL) 10 MG tablet TAKE 1 TABLET (10 MG TOTAL) BY MOUTH 4 (FOUR) TIMES DAILY. 06/19/19  Yes Adrian Prows, MD    ROS:  Out of a complete 14 system review of symptoms, the patient complains only of the following symptoms, and all other reviewed systems are negative.  Tremor Walking instability Vertigo  Blood pressure (!) 159/80, pulse 71, temperature (!) 97.1 F (36.2 C), height 5\' 8"  (1.727 m), weight 225 lb 8 oz (102.3 kg).  Physical Exam  General: The patient is alert and cooperative at the time of the examination.  The patient is obese.  Skin: 1-2+ edema  the ankles is seen bilaterally.   Neurologic Exam  Mental status: The patient is alert and oriented x 3 at the time of the examination. The patient has apparent normal recent and remote memory, with an apparently normal attention span and concentration ability.   Cranial nerves: Facial symmetry is present. Speech is normal, no aphasia or dysarthria is noted. Extraocular movements are full. Visual fields are full.  Motor: The patient has good  strength in all 4 extremities.  Sensory examination: Soft touch sensation is symmetric on the face, arms, and legs.  Coordination: The patient has good finger-nose-finger and heel-to-shin bilaterally.  There does appear to be an intention tremor with finger-nose-finger bilaterally.  When drawing a spiral, the tremor is translated into the handwriting.  Gait and station: The patient has a slightly wide-based gait, she is able to walk with a walker with fairly good stability and with good turns.  Romberg is negative.  Reflexes: Deep tendon reflexes are symmetric.   Assessment/Plan:  1.  Essential tremor  2.  Gait disturbance  The patient does not wish to undergo physical therapy for gait training again.  The patient is being careful about using a walker.  She does appear to have some vertigo following the last fall, she believes that this is gradually improving.  We could consider vestibular rehabilitation in the future if this worsens.  The patient will follow-up here in about 8 months.  She will continue her propranolol.  Jill Alexanders MD 12/19/2019 12:23 PM  [facility] [address] [place], [place] [address]  Phone [phone] Fax [phone]

## 2020-02-08 ENCOUNTER — Ambulatory Visit: Payer: Medicare Other | Admitting: Endocrinology

## 2020-03-07 ENCOUNTER — Ambulatory Visit (INDEPENDENT_AMBULATORY_CARE_PROVIDER_SITE_OTHER): Payer: Medicare Other | Admitting: Endocrinology

## 2020-03-07 ENCOUNTER — Other Ambulatory Visit: Payer: Self-pay

## 2020-03-07 ENCOUNTER — Encounter: Payer: Self-pay | Admitting: Endocrinology

## 2020-03-07 VITALS — BP 164/78 | HR 85 | Ht 68.0 in | Wt 228.6 lb

## 2020-03-07 DIAGNOSIS — R230 Cyanosis: Secondary | ICD-10-CM | POA: Diagnosis not present

## 2020-03-07 DIAGNOSIS — E1151 Type 2 diabetes mellitus with diabetic peripheral angiopathy without gangrene: Secondary | ICD-10-CM

## 2020-03-07 DIAGNOSIS — Z794 Long term (current) use of insulin: Secondary | ICD-10-CM | POA: Diagnosis not present

## 2020-03-07 LAB — POCT GLYCOSYLATED HEMOGLOBIN (HGB A1C): Hemoglobin A1C: 9.6 % — AB (ref 4.0–5.6)

## 2020-03-07 MED ORDER — INSULIN LISPRO PROT & LISPRO (75-25 MIX) 100 UNIT/ML KWIKPEN: 75.0000 [IU] | PEN_INJECTOR | Freq: Every day | SUBCUTANEOUS | 11 refills | Status: DC

## 2020-03-07 MED ORDER — TRULICITY 0.75 MG/0.5ML ~~LOC~~ SOAJ: 0.7500 mg | SUBCUTANEOUS | 3 refills | Status: DC

## 2020-03-07 NOTE — Patient Instructions (Addendum)
I have sent a prescription to your pharmacy, to add "Trulicity, and: reduce the insulin to the morning only. On this type of insulin schedule, you should eat meals on a regular schedule.  If a meal is missed or significantly delayed, your blood sugar could go low.   check your blood sugar twice a day.  vary the time of day when you check, between before the 3 meals, and at bedtime.  also check if you have symptoms of your blood sugar being too high or too low.  please keep a record of the readings and bring it to your next appointment here (or you can bring the meter itself).  You can write it on any piece of paper.  please call us sooner if your blood sugar goes below 70, or if you have a lot of readings over 200.  Please come back for a follow-up appointment in 2 months.

## 2020-03-07 NOTE — Progress Notes (Signed)
Subjective:    Patient ID: [name], female    DOB: [birth year], 86 y.o.   MRN: [id number]  HPI Pt returns for f/u of diabetes mellitus:  DM type: Insulin-requiring type 2 Dx'ed: 7262 Complications: PN and AN.  Therapy: insulin since 2010 GDM: never DKA: never [name] hypoglycemia: never Pancreatitis: never.  Pancreatic imaging: normal on 2015 MRI.  Other: She lives in independent living, and takes her own insulin; however, she is not a candidate for multiple daily injections, due to memory loss; She eats meals at 7-10 AM, 2 PM, and 7-8 PM.   Interval history: she brings a record of her fasting cbg's which I have reviewed today.  cbg varies from 86-277.  She does not check later in the day.   pt states she feels well in general.  Pt says she never misses the insulin.   Past Medical History:  Diagnosis Date  . Anemia    as a teenager  . Arthritis   . Cancer ([place])    skin  . Cholelithiasis   . Chronic insomnia   . Chronic low back pain   . Complication of anesthesia   . DDD (degenerative disc disease), lumbar   . Diabetes mellitus    Iddm x 12 years  . Diabetic peripheral neuropathy ([place])   . Essential tremor 04/01/2015  . GERD (gastroesophageal reflux disease)   . Hx of acute renal failure 02/2014   admitted to [facility]  . Hx: UTI (urinary tract infection) 02/2014  . Hyperlipemia   . Hypertension   . Memory disorder 08/06/2017  . Obesity   . Peripheral edema   . Pneumonia    as a child  . PONV (postoperative nausea and vomiting)    pt states Zofran not that helpful in the past, Phenergan works best for her  . Staph infection    1970's  . Umbilical hernia   . Wrist fracture    left October 2014    Past Surgical History:  Procedure Laterality Date  . ABDOMINAL HYSTERECTOMY    . BACK SURGERY  2005  . CARDIAC CATHETERIZATION  2000   Dr Einar Gip  . CATARACT EXTRACTION Bilateral    w/ lens implants  . COLONOSCOPY  >10 years  . EYE SURGERY    . HERNIA REPAIR     . IR KYPHO LUMBAR INC FX REDUCE BONE BX UNI/BIL CANNULATION INC/IMAGING  05/18/2017  . IR KYPHO THORACIC WITH BONE BIOPSY  07/01/2017  . IR RADIOLOGIST EVAL & MGMT  06/14/2017  . IR RADIOLOGIST EVAL & MGMT  09/09/2017  . LUMBAR LAMINECTOMY/DECOMPRESSION MICRODISCECTOMY Left 03/16/2014   Procedure: LUMBAR LAMINECTOMY/DECOMPRESSION MICRODISCECTOMY 1 LEVEL  lumbar level three/four;  Surgeon: [name], MD;  Location: [facility];  Service: Neurosurgery;  Laterality: Left;  . LUMBAR LAMINECTOMY/DECOMPRESSION MICRODISCECTOMY Right 02/06/2015   Procedure: Right L4-5 Microdiskectomy;  Surgeon: Leeroy Cha, MD;  Location: [facility];  Service: Neurosurgery;  Laterality: Right;  Right L4-5 Microdiskectomy  . MASS EXCISION Right 06/15/2019   Procedure: EXCISIONAL BIOPSY RIGHT BUTTOCK;  Surgeon: Leighton Ruff, MD;  Location: [facility];  Service: General;  Laterality: Right;  . NECK SURGERY    . OOPHORECTOMY    . TOTAL KNEE ARTHROPLASTY Right     Social History   Socioeconomic History  . Marital status: Widowed    Spouse name: Not on file  . Number of children: 3  . Years of education: 25  . Highest education level: Not on  file  Occupational History  . Occupation: Retired   Tobacco Use  . Smoking status: Never Smoker  . Smokeless tobacco: Never Used  Vaping Use  . Vaping Use: Never used  Substance and Sexual Activity  . Alcohol use: No  . Drug use: No  . Sexual activity: Not Currently  Other Topics Concern  . Not on file  Social History Narrative   Patient lives at home alone.    Patient is widowed.    Patient has 3 children and 2 step children.    Patient is right handed.    Patient has a high school education and CNA certificate.     Patient is retired.    Patient does not drink caffeine.   Social Determinants of Health   Financial Resource Strain:   . Difficulty of Paying Living Expenses:   Food Insecurity:   . Worried About Charity fundraiser in the Last  Year:   . Arboriculturist in the Last Year:   Transportation Needs:   . Film/video editor (Medical):   [name] Lack of Transportation (Non-Medical):   Physical Activity:   . Days of Exercise per Week:   . Minutes of Exercise per Session:   Stress:   . Feeling of Stress :   Social Connections:   . Frequency of Communication with Friends and Family:   . Frequency of Social Gatherings with Friends and Family:   . Attends Religious Services:   . Active Member of Clubs or Organizations:   . Attends Archivist Meetings:   [name] Marital Status:   Intimate Partner Violence:   . Fear of Current or Ex-Partner:   . Emotionally Abused:   [name] Physically Abused:   . Sexually Abused:     Current Outpatient Medications on File Prior to Visit  Medication Sig Dispense Refill  . acetaminophen (TYLENOL) 325 MG tablet Take 2 tablets (650 mg total) by mouth every 6 (six) hours as needed for mild pain (or Fever >/= 101).    . B-D ULTRAFINE III SHORT PEN 31G X 8 MM MISC 1 each by Other route 2 (two) times daily. E11.9 60 each 0  . Diclofenac Sodium CR 100 MG 24 hr tablet Take 1 tablet (100 mg total) by mouth daily. 10 tablet 0  . DULoxetine (CYMBALTA) 20 MG capsule TAKE 1 CAPSULE BY MOUTH TWICE A DAY 180 capsule 2  . glucose blood test strip Use as instructed 200 each 12  . losartan (COZAAR) 50 MG tablet Take 0.5 tablets (25 mg total) by mouth daily. 90 tablet 3  . MICROLET LANCETS MISC Use to monitor glucose levels BID 200 each 1  . midodrine (PROAMATINE) 5 MG tablet Take 5 mg by mouth daily.    . Needles & Syringes MISC 1 Package by Does not apply route 2 (two) times daily with a meal. 100 each 6  . propranolol (INDERAL) 10 MG tablet TAKE 1 TABLET (10 MG TOTAL) BY MOUTH 4 (FOUR) TIMES DAILY. 360 tablet 0   No current facility-administered medications on file prior to visit.    Allergies  Allergen Reactions  . Ciprofloxacin Other (See Comments)    Acute kidney failure  . Codeine Nausea And  Vomiting  . Dilaudid [Hydromorphone Hcl] Nausea And Vomiting and Other (See Comments)    [name] sweating  . Hydromorphone   . Mysoline [Primidone] Nausea Only  . Oxycodone Nausea And Vomiting    Family History  Problem Relation Age of Onset  .  Cancer Brother        prostate  . Heart disease Brother   . Alzheimer's disease Brother   . Diabetes Brother   . Stroke Mother   . Hypertension Mother   . COPD Brother   . Cancer Brother        Prostate cancer  . Hypertension Sister        Congestive heart failure  . Diabetes Sister   . Heart attack Son     BP (!) 164/78   Pulse 85   Ht 5\' 8"  (1.727 m)   Wt 228 lb 9.6 oz (103.7 kg)   SpO2 95%   BMI 34.76 kg/m    Review of Systems She denies hypoglycemia.     Objective:   Physical Exam  VITAL SIGNS:  See vs page GENERAL: no distress Pulses: dorsalis pedis intact bilat.   MSK: no deformity of the feet CV: 2+ bilat leg edema, and vv's Skin:  no ulcer on the feet.  normal temp on the feet.  There is cyanosis of the toenails bilaterally.   Neuro: sensation is intact to touch on the feet, but decreased from normal.    Lab Results  Component Value Date   HGBA1C 9.6 (A) 03/07/2020    Lab Results  Component Value Date   CREATININE 0.90 07/04/2019   BUN 22 07/04/2019   NA 136 07/04/2019   K 4.2 07/04/2019   CL 103 07/04/2019   CO2 22 07/04/2019       Assessment & Plan:  Insulin-requiring type 2 DM, with PN: she needs increased rx Noncompliance with cbg recording: This limits rx of DM.  We discussed need to check later in the day.  HTN: recheck next time. Overweight: Trulicity might help.  Patient Instructions  I have sent a prescription to your pharmacy, to add "Trulicity, and: reduce the insulin to the morning only. On this type of insulin schedule, you should eat meals on a regular schedule.  If a meal is missed or significantly delayed, your blood sugar could go low.   check your blood sugar twice a day.  vary  the time of day when you check, between before the 3 meals, and at bedtime.  also check if you have symptoms of your blood sugar being too high or too low.  please keep a record of the readings and bring it to your next appointment here (or you can bring the meter itself).  You can write it on any piece of paper.  please call us sooner if your blood sugar goes below 70, or if you have a lot of readings over 200.  Please come back for a follow-up appointment in 2 months.

## 2020-03-19 ENCOUNTER — Other Ambulatory Visit: Payer: Self-pay | Admitting: Endocrinology

## 2020-03-26 ENCOUNTER — Telehealth: Payer: Self-pay | Admitting: Endocrinology

## 2020-03-26 DIAGNOSIS — R230 Cyanosis: Secondary | ICD-10-CM

## 2020-03-26 NOTE — Telephone Encounter (Signed)
Rashida from [facility] called stating the request that we sent to them for the ankle index needs an authorization. Ph# [phone]

## 2020-04-01 NOTE — Addendum Note (Signed)
Addended by: Renato Shin on: 04/01/2020 03:32 PM   Modules accepted: Orders

## 2020-04-01 NOTE — Telephone Encounter (Signed)
This came through as an order so I was not aware it needed to be worked the only procedures that show in the que are ones that are placed as referrals-please place a referral for this so that I can work it and track it

## 2020-04-01 NOTE — Telephone Encounter (Signed)
Please refer to Dr. Ellison's response below 

## 2020-04-01 NOTE — Telephone Encounter (Signed)
Ok, I did ref to your ofice

## 2020-04-01 NOTE — Telephone Encounter (Signed)
Please refer to request below

## 2020-04-02 NOTE — Telephone Encounter (Signed)
Referral sent-I will f/u next week to make sure pt was scheduled

## 2020-05-02 ENCOUNTER — Other Ambulatory Visit: Payer: Self-pay | Admitting: Cardiology

## 2020-05-02 DIAGNOSIS — I10 Essential (primary) hypertension: Secondary | ICD-10-CM

## 2020-05-14 ENCOUNTER — Ambulatory Visit: Payer: Medicare Other | Admitting: Endocrinology

## 2020-05-24 ENCOUNTER — Other Ambulatory Visit: Payer: Self-pay | Admitting: Cardiology

## 2020-05-24 DIAGNOSIS — I10 Essential (primary) hypertension: Secondary | ICD-10-CM

## 2020-06-13 ENCOUNTER — Other Ambulatory Visit: Payer: Self-pay | Admitting: Endocrinology

## 2020-07-03 ENCOUNTER — Ambulatory Visit: Payer: Medicare Other | Admitting: Endocrinology

## 2020-07-03 DIAGNOSIS — E1151 Type 2 diabetes mellitus with diabetic peripheral angiopathy without gangrene: Secondary | ICD-10-CM

## 2020-07-08 ENCOUNTER — Telehealth: Payer: Self-pay | Admitting: Neurology

## 2020-07-08 DIAGNOSIS — G250 Essential tremor: Secondary | ICD-10-CM

## 2020-07-08 NOTE — Telephone Encounter (Signed)
Pt's daughter Celene Skeen on [place] called wanting to know if Dr. Jannifer Franklin can take over the refills for her propranolol (INDERAL) 10 MG tablet  Please advise.

## 2020-07-09 NOTE — Progress Notes (Signed)
Primary Physician/Referring:  Aretta Nip, MD  Patient ID: [name], female    DOB: 09/23/1932, 86 y.o.   MRN: [id number]  Chief Complaint  Patient presents with  . Irregular Heart Beat  . Leg Swelling  . Follow-up   HPI:    [name]  is a 86 y.o. Caucasian female with history of uncontrolled type 2 insulin-dependent diabetes, mixed hyperlipidemia who was previously seen in our office for orthostatic hypotension and supine hypertension.  She was last seen 05/18/2018 by Dr. Einar Gip, at that time was doing well taking midodrine for [name] orthostatic hypotension, and was instructed to follow-up as needed.  She is referred back to our office by PCP Dr. Milagros Evener for evaluation of leg swelling and tachycardia.  I personally spoke directly with Dr. Radene Ou who expresses concern as patient has not followed up in her office since early 2020.  In the interim patient seems to have developed worsening dementia and is unclear of which medicine she is taking, how much, and when.  Dr. Radene Ou informed me that patient scheduled follow-up in her office due to concern of leg swelling.  Upon examination Dr. Radene Ou noticed considerable tremor as well as sinus tachycardia at rest and irregular pulse.  In the past patient has taken propranolol for tremor, however it is unclear if patient is currently taking this medication as directed.  Patient also has an unsteady gait for which she follows with neurology, patient is a high fall risk.  In view of memory and cognitive issues, Dr. Radene Ou expresses concern patient is unable to handle her medication management personally, of note external refill review by PCP showed no recent refills in the past year of any of her medications.  However although patient has willing family support, patient is not open to assistance.  Of note patient's diabetes is not well controlled, she continues to follow with endocrinology.  Although she seems to be struggling to  remember to check her blood sugar and take her insulin on a regular basis.  Patient was started on furosemide 20 mg daily by PCP on 07/08/2020 for leg edema. PCP also stopped patient's previously prescribed midodrine at this time.  Patient presents with complaints of bilateral lower leg edema worsening over the last 1 month as well as occasional brief palpitations.  She denies chest pain, shortness of breath, orthopnea, PND.  However she is  inactive and walking/exercise is limited secondary to unsteady gait due to neuropathy.  Patient denies symptoms of claudication, or resting limb pain.   Past Medical History:  Diagnosis Date  . Anemia    as a teenager  . Arthritis   . Cancer ([place])    skin  . Cholelithiasis   . Chronic insomnia   . Chronic low back pain   . Complication of anesthesia   . DDD (degenerative disc disease), lumbar   . Diabetes mellitus    Iddm x 12 years  . Diabetic peripheral neuropathy ([place])   . Essential tremor 04/01/2015  . GERD (gastroesophageal reflux disease)   . Hx of acute renal failure 02/2014   admitted to [facility]  . Hx: UTI (urinary tract infection) 02/2014  . Hyperlipemia   . Hypertension   . Memory disorder 08/06/2017  . Obesity   . Peripheral edema   . Pneumonia    as a child  . PONV (postoperative nausea and vomiting)    pt states Zofran not that helpful in the past, Phenergan works best for  her  . Staph infection    1970's  . Umbilical hernia   . Wrist fracture    left October 2014   Past Surgical History:  Procedure Laterality Date  . ABDOMINAL HYSTERECTOMY    . BACK SURGERY  2005  . CARDIAC CATHETERIZATION  2000   Dr Einar Gip  . CATARACT EXTRACTION Bilateral    w/ lens implants  . COLONOSCOPY  >10 years  . EYE SURGERY    . HERNIA REPAIR    . IR KYPHO LUMBAR INC FX REDUCE BONE BX UNI/BIL CANNULATION INC/IMAGING  05/18/2017  . IR KYPHO THORACIC WITH BONE BIOPSY  07/01/2017  . IR RADIOLOGIST EVAL & MGMT  06/14/2017  . IR RADIOLOGIST EVAL  & MGMT  09/09/2017  . LUMBAR LAMINECTOMY/DECOMPRESSION MICRODISCECTOMY Left 03/16/2014   Procedure: LUMBAR LAMINECTOMY/DECOMPRESSION MICRODISCECTOMY 1 LEVEL  lumbar level three/four;  Surgeon: [name], MD;  Location: [facility];  Service: Neurosurgery;  Laterality: Left;  . LUMBAR LAMINECTOMY/DECOMPRESSION MICRODISCECTOMY Right 02/06/2015   Procedure: Right L4-5 Microdiskectomy;  Surgeon: Leeroy Cha, MD;  Location: [place] [facility];  Service: Neurosurgery;  Laterality: Right;  Right L4-5 Microdiskectomy  . MASS EXCISION Right 06/15/2019   Procedure: EXCISIONAL BIOPSY RIGHT BUTTOCK;  Surgeon: Leighton Ruff, MD;  Location: [facility];  Service: General;  Laterality: Right;  . NECK SURGERY    . OOPHORECTOMY    . TOTAL KNEE ARTHROPLASTY Right    Family History  Problem Relation Age of Onset  . Cancer Brother        prostate  . Heart disease Brother   . Alzheimer's disease Brother   . Diabetes Brother   . Stroke Mother   . Hypertension Mother   . COPD Brother   . Cancer Brother        Prostate cancer  . Hypertension Sister        Congestive heart failure  . Diabetes Sister   . Heart attack Son     Social History   Tobacco Use  . Smoking status: Never Smoker  . Smokeless tobacco: Never Used  Substance Use Topics  . Alcohol use: No   Marital Status: Widowed   ROS  Review of Systems  Constitutional: Negative for malaise/fatigue and weight gain.  Cardiovascular: Positive for dyspnea on exertion (Chronic, stable), leg swelling and palpitations. Negative for chest pain, claudication, near-syncope, orthopnea, paroxysmal nocturnal dyspnea and syncope.  Respiratory: Negative for shortness of breath.   Hematologic/Lymphatic: Does not bruise/bleed easily.  Gastrointestinal: Negative for melena.  Neurological: Positive for disturbances in coordination and tremors. Negative for dizziness and weakness.    Objective  Blood pressure (!) 142/80, pulse 68, resp. rate 16,  height _0  (1.727 m), weight 228 lb (103.4 kg), SpO2 92 %.  Vitals with BMI 07/10/2020 03/07/2020 12/19/2019  Height _1  _2  _3   Weight 228 lbs 228 lbs 10 oz 225 lbs 8 oz  BMI 34.68 85.88 50.2  Systolic 774 128 786  Diastolic 80 78 80  Pulse 68 85 71    Orthostatic VS for the past 72 hrs (Last 3 readings):  Orthostatic BP Patient Position BP Location Cuff Size Orthostatic Pulse  07/10/20 1014 136/57 Standing Left Arm Large 76  07/10/20 1013 165/75 Sitting Left Arm Large 87  07/10/20 1012 152/67 Supine Left Arm Large 67     Physical Exam Vitals reviewed.  Constitutional:      Comments: Well-built and mildly obese  HENT:     Head: Normocephalic and atraumatic.  Cardiovascular:     Rate and Rhythm: Normal rate and regular rhythm.     Pulses: Intact distal pulses.          Carotid pulses are 2+ on the right side and 2+ on the left side.      Radial pulses are 2+ on the right side and 2+ on the left side.       Dorsalis pedis pulses are 1+ on the right side and 1+ on the left side.       Posterior tibial pulses are 1+ on the right side and 1+ on the left side.     Heart sounds: S1 normal and S2 normal. Murmur heard.  Early systolic murmur is present with a grade of 1/6 at the upper right sternal border.  No gallop.      Comments: Femoral and popliteal pulses difficult to evaluate due to patient's body habitus. Bilateral non-pitting edema below the knee Pulmonary:     Effort: Pulmonary effort is normal. No respiratory distress.     Breath sounds: No wheezing, rhonchi or rales.  Musculoskeletal:     Right lower leg: No edema.     Left lower leg: No edema.  Neurological:     Mental Status: She is alert.    Laboratory examination:   No results for input(s): NA, K, CL, CO2, GLUCOSE, BUN, CREATININE, CALCIUM, GFRNONAA, GFRAA in the last 8760 hours. CrCl cannot be calculated (Patient's most recent lab result is older than the maximum 21 days allowed.).  CMP Latest Ref Rng &  Units 07/04/2019 07/01/2017 05/19/2017  Glucose 70 - 99 mg/dL 268(H) 99 164(H)  BUN 8 - 23 mg/dL 22 22(H) 13  Creatinine 0.44 - 1.00 mg/dL 0.90 1.11(H) 0.85  Sodium 135 - 145 mmol/L 136 141 139  Potassium 3.5 - 5.1 mmol/L 4.2 4.0 3.7  Chloride 98 - 111 mmol/L 103 102 108  CO2 22 - 32 mmol/L _0 Calcium 8.9 - 10.3 mg/dL 8.9 9.2 9.0  Total Protein 6.5 - 8.1 g/dL 6.4(L) - -  Total Bilirubin 0.3 - 1.2 mg/dL 0.7 - -  Alkaline Phos 38 - 126 U/L 64 - -  AST 15 - 41 U/L 19 - -  ALT 0 - 44 U/L 13 - -   CBC Latest Ref Rng & Units 07/04/2019 07/01/2017 05/19/2017  WBC 4.0 - 10.5 K/uL 6.6 10.1 7.5  Hemoglobin 12.0 - 15.0 g/dL 13.1 13.2 12.3  Hematocrit 36 - 46 % 40.7 39.3 38.2  Platelets 150 - 400 K/uL 188 230 202    Lipid Panel No results for input(s): CHOL, TRIG, LDLCALC, VLDL, HDL, CHOLHDL, LDLDIRECT in the last 8760 hours.  HEMOGLOBIN A1C Lab Results  Component Value Date   HGBA1C 9.6 (A) 03/07/2020   MPG 189 01/05/2017   TSH No results for input(s): TSH in the last 8760 hours.  External labs:   06/26/2020: BUN 25, creatinine 1.05, EGFR 50, sodium 141, potassium 4.7, CMP otherwise normal.  Medications and allergies   Allergies  Allergen Reactions  . Ciprofloxacin Other (See Comments)    Acute kidney failure  . Codeine Nausea And Vomiting  . Dilaudid [Hydromorphone Hcl] Nausea And Vomiting and Other (See Comments)    [name] sweating  . Hydromorphone   . Mysoline [Primidone] Nausea Only  . Oxycodone Nausea And Vomiting     Outpatient Medications Prior to Visit  Medication Sig Dispense Refill  . acetaminophen (TYLENOL) 325 MG tablet Take 2 tablets (650 mg total) by  mouth every 6 (six) hours as needed for mild pain (or Fever >/= 101).    . B-D ULTRAFINE III SHORT PEN 31G X 8 MM MISC USE TWICE A DAY AS DIRECTED 200 each 3  . Dulaglutide (TRULICITY) 3.54 SF/6.8LE SOPN Inject 0.5 mLs (0.75 mg total) into the skin once a week. 4 pen 3  . DULoxetine (CYMBALTA) 20 MG  capsule TAKE 1 CAPSULE BY MOUTH TWICE A DAY 180 capsule 2  . furosemide (LASIX) 20 MG tablet Take 20 mg by mouth daily.    [name] glucose blood test strip Use as instructed 200 each 12  . HUMALOG MIX 75/25 KWIKPEN (75-25) 100 UNIT/ML Kwikpen INJECT 75 UNITS WITH BREAKFAST AND 10 UNITS WITH SUPPER 15 mL 1  . losartan (COZAAR) 50 MG tablet TAKE 1/2 TABLET BY MOUTH DAILY 45 tablet 7  . MICROLET LANCETS MISC Use to monitor glucose levels BID 200 each 1  . MYRBETRIQ 50 MG TB24 tablet Take 50 mg by mouth daily.    . Needles & Syringes MISC 1 Package by Does not apply route 2 (two) times daily with a meal. 100 each 6  . oxybutynin (DITROPAN-XL) 10 MG 24 hr tablet Take 10 mg by mouth daily.    . propranolol (INDERAL) 10 MG tablet TAKE 1 TABLET (10 MG TOTAL) BY MOUTH 4 (FOUR) TIMES DAILY. 360 tablet 0  . Diclofenac Sodium CR 100 MG 24 hr tablet Take 1 tablet (100 mg total) by mouth daily. 10 tablet 0  . midodrine (PROAMATINE) 5 MG tablet Take 5 mg by mouth daily.     No facility-administered medications prior to visit.     Radiology:   No results found.  Cardiac Studies:   Pulse oximetry 04/07/2017: Does not qualify for nocturnal oxygen per Medicare guidelines as patient did not experience qualifying desaturation events.  Echocardiogram 12/25/2016: Normal left ventricle: The cavity size was normal.  Wall thickness was normal.  Systolic function was normal.  Estimated ejection fraction 60-65%.  Wall motion was normal.  There were no regional wall motion abnormalities.  Doppler parameters are consistent with abnormal left ventricular relaxation (grade 1 diastolic dysfunction). Pulmonary arteries: Systolic pressure was mildly increased.  PA peak pressure 37 mmHg  EKG:   EKG 07/10/2020: Sinus rhythm at a rate of 70 bpm with first-degree AV block, left atrial enlargement.  Normal axis.  Poor R wave progression, cannot exclude anterior septal infarct old. Borderline LVH by voltage criteria.  Compared to EKG  05/18/2018, no significant change.  EKG 01/04/2017: Sinus tachycardia at a rate of 128 bpm, normal axis, poor R wave progression, cannot exclude anterior septal infarct old.  No evidence of ischemia.  Normal QT interval.  Assessment     ICD-10-CM   1. Bilateral leg edema  R60.0 Brain natriuretic peptide    PCV ECHOCARDIOGRAM COMPLETE  2. Palpitations  R00.2 EKG 12-Lead    TSH    PCV ECHOCARDIOGRAM COMPLETE  3. Orthostatic hypotension  I95.1   4. Supine hypertension  I10   5. Mixed hyperlipidemia  E78.2 Lipid Panel With LDL/HDL Ratio  6. Essential tremor  G25.0 propranolol (INDERAL) 10 MG tablet     Medications Discontinued During This Encounter  Medication Reason  . Diclofenac Sodium CR 100 MG 24 hr tablet Patient Preference  . midodrine (PROAMATINE) 5 MG tablet Patient Preference  . propranolol (INDERAL) 10 MG tablet Reorder    Meds ordered this encounter  Medications  . propranolol (INDERAL) 10 MG tablet    Sig:  Take 1 tablet (10 mg total) by mouth 4 (four) times daily.    Dispense:  360 tablet    Refill:  0    Recommendations:   [name] is a 86 y.o. Caucasian female with history of uncontrolled type 2 insulin-dependent diabetes, mixed hyperlipidemia who was previously seen in our office for orthostatic hypotension and supine hypertension.  She was last seen 05/18/2018 by Dr. Einar Gip, at that time was doing well taking midodrine for [name] orthostatic hypotension, and was instructed to follow-up as needed.  She is referred back to our office by PCP Dr. Milagros Evener for evaluation of leg swelling and tachycardia.  Patient presents she has been referred back to our office by Dr. Radene Ou for evaluation of leg edema and irregular pulse noted on PCP exam.  Patient does report occasional palpitations, although she is supposedly taking propranolol 20 mg twice daily.  In view that PCP noted irregular pulse, although this is not present in her office visit today and EKG is stable  from previous visit, will obtain 2-week cardiac monitor.  Patient also has moderate bilateral lower leg edema.  Suspect this is multifactorial including high salt diet.  However patient's previous echo in 7414 did note diastolic dysfunction.  We will repeat echocardiogram at this time.  We will also obtain TSH and BNP for further evaluation of etiology of palpitations and leg edema respectively.  Patient will continue 20 mg furosemide daily for leg edema at this time.  Patient's blood pressure is elevated today, however she has positive orthostatic vitals.  She is asymptomatic, denying dizziness, near syncope, syncope.  However in view of this we will not make changes to her antihypertensive medications at this time.  Instructed patient to monitor blood pressure on a daily basis at home and keep a log to bring with her to follow-up appointment, will reevaluate blood pressure control at this time.  Although patient is asymptomatic, suspect she is not active enough to provoke dyspnea that she has reported in the past.  We will continue to monitor this.  Of note patient requested a refill of her propranolol which she takes for essential tremor as she is almost out and does not see her neurologist again until December.  Will refill today, but encourage patient to follow-up with neurology regarding this.  Discussed with patient and her daughter Juliann Pulse, who was present, importance of diet and lifestyle modifications as well as medication management.    This was a 40-minute encounter with face-to-face counseling, medical records review, coordination of care, explanation of complex medical issues, complex medical decision making, external record and result review, discussion of patient with PCP.   During this visit I reviewed and updated: Tobacco history  allergies medication reconciliation  medical history  surgical history  family history  social history.  This note was created using a voice recognition  software as a result there may be grammatical errors inadvertently enclosed that do not reflect the nature of this encounter. Every attempt is made to correct such errors.   Alethia Berthold, PA-C 07/10/2020, 10:38 AM Office: [phone]

## 2020-07-10 ENCOUNTER — Ambulatory Visit: Payer: Medicare Other | Admitting: Student

## 2020-07-10 ENCOUNTER — Ambulatory Visit: Payer: Medicare Other

## 2020-07-10 ENCOUNTER — Encounter: Payer: Self-pay | Admitting: Student

## 2020-07-10 ENCOUNTER — Other Ambulatory Visit: Payer: Self-pay

## 2020-07-10 VITALS — BP 142/80 | HR 68 | Resp 16 | Ht 68.0 in | Wt 228.0 lb

## 2020-07-10 DIAGNOSIS — R000 Tachycardia, unspecified: Secondary | ICD-10-CM

## 2020-07-10 DIAGNOSIS — E782 Mixed hyperlipidemia: Secondary | ICD-10-CM

## 2020-07-10 DIAGNOSIS — R002 Palpitations: Secondary | ICD-10-CM

## 2020-07-10 DIAGNOSIS — I10 Essential (primary) hypertension: Secondary | ICD-10-CM

## 2020-07-10 DIAGNOSIS — R600 Localized edema: Secondary | ICD-10-CM

## 2020-07-10 DIAGNOSIS — I951 Orthostatic hypotension: Secondary | ICD-10-CM

## 2020-07-10 DIAGNOSIS — G250 Essential tremor: Secondary | ICD-10-CM

## 2020-07-10 MED ORDER — PROPRANOLOL HCL 10 MG PO TABS: 10.0000 mg | ORAL_TABLET | Freq: Four times a day (QID) | ORAL | 0 refills | Status: DC

## 2020-07-10 MED ORDER — PROPRANOLOL HCL 10 MG PO TABS: 10.0000 mg | ORAL_TABLET | Freq: Four times a day (QID) | ORAL | 3 refills | Status: DC

## 2020-07-10 NOTE — Addendum Note (Signed)
Addended by: Rosezena Sensor on: 07/10/2020 06:22 PM   Modules accepted: Orders

## 2020-07-10 NOTE — Telephone Encounter (Signed)
I called patient. Talked to the daughter, I will call in prescription for the tramadol.

## 2020-07-11 ENCOUNTER — Encounter: Payer: Self-pay | Admitting: General Practice

## 2020-07-11 ENCOUNTER — Encounter: Payer: Self-pay | Admitting: Endocrinology

## 2020-07-11 ENCOUNTER — Ambulatory Visit (INDEPENDENT_AMBULATORY_CARE_PROVIDER_SITE_OTHER): Payer: Medicare Other | Admitting: Endocrinology

## 2020-07-11 VITALS — BP 142/64 | Ht 68.0 in | Wt 233.0 lb

## 2020-07-11 DIAGNOSIS — E1151 Type 2 diabetes mellitus with diabetic peripheral angiopathy without gangrene: Secondary | ICD-10-CM | POA: Diagnosis not present

## 2020-07-11 DIAGNOSIS — Z794 Long term (current) use of insulin: Secondary | ICD-10-CM

## 2020-07-11 MED ORDER — INSULIN LISPRO PROT & LISPRO (75-25 MIX) 100 UNIT/ML KWIKPEN: 60.0000 [IU] | PEN_INJECTOR | Freq: Every day | SUBCUTANEOUS | 1 refills | Status: DC

## 2020-07-11 MED ORDER — TRULICITY 1.5 MG/0.5ML ~~LOC~~ SOAJ: 1.5000 mg | SUBCUTANEOUS | 3 refills | Status: DC

## 2020-07-11 NOTE — Patient Instructions (Addendum)
I have sent a prescription to your pharmacy, to increase the Trulicity, and: reduce the insulin to 60 units with breakfast.  On this type of insulin schedule, you should eat meals on a regular schedule.  If a meal is missed or significantly delayed, your blood sugar could go low.   check your blood sugar twice a day.  vary the time of day when you check, between before the 3 meals, and at bedtime.  also check if you have symptoms of your blood sugar being too high or too low.  please keep a record of the readings and bring it to your next appointment here (or you can bring the meter itself).  You can write it on any piece of paper.  please call us sooner if your blood sugar goes below 70, or if you have a lot of readings over 200.  Please come back for a follow-up appointment in January.

## 2020-07-11 NOTE — Addendum Note (Signed)
Addended by: Rosezena Sensor on: 07/11/2020 09:55 AM   Modules accepted: Orders

## 2020-07-11 NOTE — Progress Notes (Signed)
Subjective:    Patient ID: [name], female    DOB: November 24, 1932, 86 y.o.   MRN: [id number]  HPI Pt returns for f/u of diabetes mellitus:  DM type: Insulin-requiring type 2 Dx'ed: 3546 Complications: PN and AN.  Therapy: insulin since 2010 GDM: never DKA: never [name] hypoglycemia: never Pancreatitis: never.  Pancreatic imaging: normal on 2015 MRI.  Other: She lives alone, and takes her own insulin; however, she is not a candidate for multiple daily injections, due to memory loss; She eats meals at 7-10 AM, 2 PM, and 7-8 PM.   Interval history: she brings a record of her fasting cbg's which I have reviewed today.  cbg varies from 77-309. There is no trend throughout the day.  pt states she feels well in general.  Pt says she never misses the insulin.  She seldom has hypoglycemia, and these episodes are mild.  She does not take PM insulin.  Past Medical History:  Diagnosis Date  . Anemia    as a teenager  . Arthritis   . Cancer ([place])    skin  . Cholelithiasis   . Chronic insomnia   . Chronic low back pain   . Complication of anesthesia   . DDD (degenerative disc disease), lumbar   . Diabetes mellitus    Iddm x 12 years  . Diabetic peripheral neuropathy ([place])   . Essential tremor 04/01/2015  . GERD (gastroesophageal reflux disease)   . Hx of acute renal failure 02/2014   admitted to [facility]  . Hx: UTI (urinary tract infection) 02/2014  . Hyperlipemia   . Hypertension   . Memory disorder 08/06/2017  . Obesity   . Peripheral edema   . Pneumonia    as a child  . PONV (postoperative nausea and vomiting)    pt states Zofran not that helpful in the past, Phenergan works best for her  . Staph infection    1970's  . Umbilical hernia   . Wrist fracture    left October 2014    Past Surgical History:  Procedure Laterality Date  . ABDOMINAL HYSTERECTOMY    . BACK SURGERY  2005  . CARDIAC CATHETERIZATION  2000   Dr Einar Gip  . CATARACT EXTRACTION Bilateral    w/ lens  implants  . COLONOSCOPY  >10 years  . EYE SURGERY    . HERNIA REPAIR    . IR KYPHO LUMBAR INC FX REDUCE BONE BX UNI/BIL CANNULATION INC/IMAGING  05/18/2017  . IR KYPHO THORACIC WITH BONE BIOPSY  07/01/2017  . IR RADIOLOGIST EVAL & MGMT  06/14/2017  . IR RADIOLOGIST EVAL & MGMT  09/09/2017  . LUMBAR LAMINECTOMY/DECOMPRESSION MICRODISCECTOMY Left 03/16/2014   Procedure: LUMBAR LAMINECTOMY/DECOMPRESSION MICRODISCECTOMY 1 LEVEL  lumbar level three/four;  Surgeon: [name], MD;  Location: [facility];  Service: Neurosurgery;  Laterality: Left;  . LUMBAR LAMINECTOMY/DECOMPRESSION MICRODISCECTOMY Right 02/06/2015   Procedure: Right L4-5 Microdiskectomy;  Surgeon: Leeroy Cha, MD;  Location: [facility];  Service: Neurosurgery;  Laterality: Right;  Right L4-5 Microdiskectomy  . MASS EXCISION Right 06/15/2019   Procedure: EXCISIONAL BIOPSY RIGHT BUTTOCK;  Surgeon: Leighton Ruff, MD;  Location: [facility];  Service: General;  Laterality: Right;  . NECK SURGERY    . OOPHORECTOMY    . TOTAL KNEE ARTHROPLASTY Right     Social History   Socioeconomic History  . Marital status: Widowed    Spouse name: Not on file  . Number of children: 3  .  Years of education: 64  . Highest education level: Not on file  Occupational History  . Occupation: Retired   Tobacco Use  . Smoking status: Never Smoker  . Smokeless tobacco: Never Used  Vaping Use  . Vaping Use: Never used  Substance and Sexual Activity  . Alcohol use: No  . Drug use: No  . Sexual activity: Not Currently  Other Topics Concern  . Not on file  Social History Narrative   Patient lives at home alone.    Patient is widowed.    Patient has 3 children and 2 step children.    Patient is right handed.    Patient has a high school education and CNA certificate.     Patient is retired.    Patient does not drink caffeine.   Social Determinants of Health   Financial Resource Strain:   . Difficulty of Paying Living  Expenses: Not on file  Food Insecurity:   . Worried About Charity fundraiser in the Last Year: Not on file  . Ran Out of Food in the Last Year: Not on file  Transportation Needs:   . Lack of Transportation (Medical): Not on file  . Lack of Transportation (Non-Medical): Not on file  Physical Activity:   . Days of Exercise per Week: Not on file  . Minutes of Exercise per Session: Not on file  Stress:   . Feeling of Stress : Not on file  Social Connections:   . Frequency of Communication with Friends and Family: Not on file  . Frequency of Social Gatherings with Friends and Family: Not on file  . Attends Religious Services: Not on file  . Active Member of Clubs or Organizations: Not on file  . Attends Archivist Meetings: Not on file  . Marital Status: Not on file  Intimate Partner Violence:   . Fear of Current or Ex-Partner: Not on file  . Emotionally Abused: Not on file  . Physically Abused: Not on file  . Sexually Abused: Not on file    Current Outpatient Medications on File Prior to Visit  Medication Sig Dispense Refill  . acetaminophen (TYLENOL) 325 MG tablet Take 2 tablets (650 mg total) by mouth every 6 (six) hours as needed for mild pain (or Fever >/= 101).    . B-D ULTRAFINE III SHORT PEN 31G X 8 MM MISC USE TWICE A DAY AS DIRECTED 200 each 3  . DULoxetine (CYMBALTA) 20 MG capsule TAKE 1 CAPSULE BY MOUTH TWICE A DAY 180 capsule 2  . furosemide (LASIX) 20 MG tablet Take 20 mg by mouth daily.    [name] glucose blood test strip Use as instructed 200 each 12  . losartan (COZAAR) 50 MG tablet TAKE 1/2 TABLET BY MOUTH DAILY 45 tablet 7  . MICROLET LANCETS MISC Use to monitor glucose levels BID 200 each 1  . MYRBETRIQ 50 MG TB24 tablet Take 50 mg by mouth daily.    . Needles & Syringes MISC 1 Package by Does not apply route 2 (two) times daily with a meal. 100 each 6  . oxybutynin (DITROPAN-XL) 10 MG 24 hr tablet Take 10 mg by mouth daily.    . propranolol (INDERAL) 10 MG  tablet Take 1 tablet (10 mg total) by mouth 4 (four) times daily. 360 tablet 3   No current facility-administered medications on file prior to visit.    Allergies  Allergen Reactions  . Ciprofloxacin Other (See Comments)    Acute kidney failure  .  Codeine Nausea And Vomiting  . Dilaudid [Hydromorphone Hcl] Nausea And Vomiting and Other (See Comments)    [name] sweating  . Hydromorphone   . Mysoline [Primidone] Nausea Only  . Oxycodone Nausea And Vomiting    Family History  Problem Relation Age of Onset  . Cancer Brother        prostate  . Heart disease Brother   . Alzheimer's disease Brother   . Diabetes Brother   . Stroke Mother   . Hypertension Mother   . COPD Brother   . Cancer Brother        Prostate cancer  . Hypertension Sister        Congestive heart failure  . Diabetes Sister   . Heart attack Son     BP (!) 142/64   Ht 5\' 8"  (1.727 m)   Wt 233 lb (105.7 kg)   BMI 35.43 kg/m    Review of Systems     Objective:   Physical Exam VITAL SIGNS:  See vs page GENERAL: no distress Pulses: dorsalis pedis intact bilat.   MSK: no deformity of the feet CV: 2+ bilat leg edema, and vv's Skin:  no ulcer on the feet.  normal temp on the feet.  There is cyanosis of the toenails bilaterally.   Neuro: sensation is intact to touch on the feet, but decreased from normal.    A1c=8.7%     Assessment & Plan:  Insulin-requiring type 2 DM, with PN: uncontrolled Hypoglycemia, due to insulin: we'll favor GLP over insulin.   Patient Instructions  I have sent a prescription to your pharmacy, to increase the Trulicity, and: reduce the insulin to 60 units with breakfast.  On this type of insulin schedule, you should eat meals on a regular schedule.  If a meal is missed or significantly delayed, your blood sugar could go low.   check your blood sugar twice a day.  vary the time of day when you check, between before the 3 meals, and at bedtime.  also check if you have symptoms  of your blood sugar being too high or too low.  please keep a record of the readings and bring it to your next appointment here (or you can bring the meter itself).  You can write it on any piece of paper.  please call us sooner if your blood sugar goes below 70, or if you have a lot of readings over 200.  Please come back for a follow-up appointment in January.

## 2020-07-12 LAB — LIPID PANEL WITH LDL/HDL RATIO
Cholesterol, Total: 177 mg/dL (ref 100–199)
HDL: 54 mg/dL (ref >39)
LDL Chol Calc (NIH): 96 mg/dL (ref 0–99)
LDL/HDL Ratio: 1.8 ratio (ref 0.0–3.2)
Triglycerides: 156 mg/dL — ABNORMAL HIGH (ref 0–149)
VLDL Cholesterol Cal: 27 mg/dL (ref 5–40)

## 2020-07-12 LAB — TSH: TSH: 5.96 u[IU]/mL — ABNORMAL HIGH (ref 0.450–4.500)

## 2020-07-12 LAB — BRAIN NATRIURETIC PEPTIDE: BNP: 95.9 pg/mL (ref 0.0–100.0)

## 2020-07-12 NOTE — Progress Notes (Signed)
Called patient, NA, LMAM regarding her TSH result.

## 2020-07-12 NOTE — Progress Notes (Signed)
Please inform patient she is not in heart failure and that cholesterol is well controlled.  However thyroid function is abnormal, recommend follow-up with PCP.

## 2020-07-15 NOTE — Progress Notes (Signed)
Pt is aware.  

## 2020-07-16 ENCOUNTER — Other Ambulatory Visit: Payer: Medicare Other

## 2020-07-25 ENCOUNTER — Other Ambulatory Visit: Payer: Self-pay

## 2020-07-25 ENCOUNTER — Ambulatory Visit: Payer: Medicare Other

## 2020-07-25 DIAGNOSIS — R002 Palpitations: Secondary | ICD-10-CM

## 2020-07-25 DIAGNOSIS — R600 Localized edema: Secondary | ICD-10-CM

## 2020-07-29 NOTE — Progress Notes (Signed)
Called pt, no answer. Left vm requesting call back. AD/S

## 2020-07-29 NOTE — Progress Notes (Signed)
Spoke with patient. Patient voiced understanding.

## 2020-07-29 NOTE — Progress Notes (Signed)
Please inform patient echo showed mildly leaky valves and normal heart function. Will discuss further at next visit.

## 2020-08-07 NOTE — Progress Notes (Signed)
Primary Physician/Referring:  Aretta Nip, MD  Patient ID: [name], female    DOB: Jan 14, 1933, 86 y.o.   MRN: [id number]  Chief Complaint  Patient presents with  . Hypertension  . Leg Swelling  . Follow-up    4 weeks   HPI:    [name]  is a 86 y.o. Caucasian female with history of uncontrolled type 2 insulin-dependent diabetes, mixed hyperlipidemia who was previously seen in our office for orthostatic hypotension and supine hypertension.  She was last seen 05/18/2018 by Dr. Einar Gip, at that time was doing well taking midodrine for [name] orthostatic hypotension, and was instructed to follow-up as needed.  She is referred back to our office by PCP Dr. Milagros Evener for evaluation of leg swelling and tachycardia.  Patient presents for 1 month follow up and discussion of cardiac testing results.  Patient reports she is presently feeling well, with one episode of left arm pain lasting less than 1 minute since last visit.  Denies chest pain, dyspnea, orthopnea, PND.  Patient reports bilateral leg edema has improved since previous office visit.  She does still live alone and continues to eat mostly frozen meals with high salt content.  She is not active on a regular basis and has no formal exercise routine.  Her activity is limited secondary to an unsteady gait due to neuropathy.  She continues to follow with endocrinology for diabetes management as well as Dr. Milagros Evener as her primary care provider.  Past Medical History:  Diagnosis Date  . Anemia    as a teenager  . Arthritis   . Cancer ([place])    skin  . Cholelithiasis   . Chronic insomnia   . Chronic low back pain   . Complication of anesthesia   . DDD (degenerative disc disease), lumbar   . Diabetes mellitus    Iddm x 12 years  . Diabetic peripheral neuropathy ([place])   . Essential tremor 04/01/2015  . GERD (gastroesophageal reflux disease)   . Hx of acute renal failure 02/2014   admitted to [facility]  .  Hx: UTI (urinary tract infection) 02/2014  . Hyperlipemia   . Hypertension   . Memory disorder 08/06/2017  . Obesity   . Peripheral edema   . Pneumonia    as a child  . PONV (postoperative nausea and vomiting)    pt states Zofran not that helpful in the past, Phenergan works best for her  . Staph infection    1970's  . Umbilical hernia   . Wrist fracture    left October 2014   Past Surgical History:  Procedure Laterality Date  . ABDOMINAL HYSTERECTOMY    . BACK SURGERY  2005  . CARDIAC CATHETERIZATION  2000   Dr Einar Gip  . CATARACT EXTRACTION Bilateral    w/ lens implants  . COLONOSCOPY  >10 years  . EYE SURGERY    . HERNIA REPAIR    . IR KYPHO LUMBAR INC FX REDUCE BONE BX UNI/BIL CANNULATION INC/IMAGING  05/18/2017  . IR KYPHO THORACIC WITH BONE BIOPSY  07/01/2017  . IR RADIOLOGIST EVAL & MGMT  06/14/2017  . IR RADIOLOGIST EVAL & MGMT  09/09/2017  . LUMBAR LAMINECTOMY/DECOMPRESSION MICRODISCECTOMY Left 03/16/2014   Procedure: LUMBAR LAMINECTOMY/DECOMPRESSION MICRODISCECTOMY 1 LEVEL  lumbar level three/four;  Surgeon: [name], MD;  Location: [facility];  Service: Neurosurgery;  Laterality: Left;  . LUMBAR LAMINECTOMY/DECOMPRESSION MICRODISCECTOMY Right 02/06/2015   Procedure: Right L4-5 Microdiskectomy;  Surgeon: Stann Mainland  Joya Salm, MD;  Location: [facility];  Service: Neurosurgery;  Laterality: Right;  Right L4-5 Microdiskectomy  . MASS EXCISION Right 06/15/2019   Procedure: EXCISIONAL BIOPSY RIGHT BUTTOCK;  Surgeon: Leighton Ruff, MD;  Location: [facility];  Service: General;  Laterality: Right;  . NECK SURGERY    . OOPHORECTOMY    . TOTAL KNEE ARTHROPLASTY Right    Family History  Problem Relation Age of Onset  . Cancer Brother        prostate  . Heart disease Brother   . Alzheimer's disease Brother   . Diabetes Brother   . Stroke Mother   . Hypertension Mother   . COPD Brother   . Cancer Brother        Prostate cancer  . Hypertension Sister         Congestive heart failure  . Diabetes Sister   . Heart attack Son     Social History   Tobacco Use  . Smoking status: Never Smoker  . Smokeless tobacco: Never Used  Substance Use Topics  . Alcohol use: No   Marital Status: Widowed   ROS  Review of Systems  Constitutional: Negative for malaise/fatigue and weight gain.  Cardiovascular: Positive for dyspnea on exertion (Chronic, stable) and leg swelling (improving ). Negative for chest pain, claudication, near-syncope, orthopnea, palpitations, paroxysmal nocturnal dyspnea and syncope.  Respiratory: Negative for shortness of breath.   Hematologic/Lymphatic: Does not bruise/bleed easily.  Gastrointestinal: Negative for melena.  Neurological: Positive for disturbances in coordination and tremors. Negative for dizziness and weakness.    Objective  Blood pressure (!) 142/84, pulse 86, resp. rate 16, height _0  (1.727 m), weight 237 lb 9.6 oz (107.8 kg), SpO2 95 %.  Vitals with BMI 08/08/2020 07/11/2020 07/10/2020  Height _1  _2  _3   Weight 237 lbs 10 oz 233 lbs 228 lbs  BMI 36.14 50.56 97.94  Systolic 801 655 374  Diastolic 84 64 80  Pulse 86 - 68    Orthostatic VS for the past 72 hrs (Last 3 readings):  Patient Position BP Location Cuff Size  08/08/20 1055 Sitting Left Arm Large     Physical Exam Vitals reviewed.  Constitutional:      Comments: Well-built and mildly obese  HENT:     Head: Normocephalic and atraumatic.  Cardiovascular:     Rate and Rhythm: Normal rate and regular rhythm.     Pulses: Intact distal pulses.          Carotid pulses are 2+ on the right side and 2+ on the left side.      Radial pulses are 2+ on the right side and 2+ on the left side.       Dorsalis pedis pulses are 1+ on the right side and 1+ on the left side.       Posterior tibial pulses are 1+ on the right side and 1+ on the left side.     Heart sounds: S1 normal and S2 normal. Murmur heard.  Early systolic murmur is present with a  grade of 1/6 at the upper right sternal border.  No gallop.      Comments: Femoral and popliteal pulses difficult to evaluate due to patient's body habitus. Bilateral non-pitting edema below the knee Pulmonary:     Effort: Pulmonary effort is normal. No respiratory distress.     Breath sounds: No wheezing, rhonchi or rales.  Musculoskeletal:     Right lower leg: No edema.  Left lower leg: No edema.  Neurological:     Mental Status: She is alert.    Laboratory examination:   No results for input(s): NA, K, CL, CO2, GLUCOSE, BUN, CREATININE, CALCIUM, GFRNONAA, GFRAA in the last 8760 hours. CrCl cannot be calculated (Patient's most recent lab result is older than the maximum 21 days allowed.).  CMP Latest Ref Rng & Units 07/04/2019 07/01/2017 05/19/2017  Glucose 70 - 99 mg/dL 268(H) 99 164(H)  BUN 8 - 23 mg/dL 22 22(H) 13  Creatinine 0.44 - 1.00 mg/dL 0.90 1.11(H) 0.85  Sodium 135 - 145 mmol/L 136 141 139  Potassium 3.5 - 5.1 mmol/L 4.2 4.0 3.7  Chloride 98 - 111 mmol/L 103 102 108  CO2 22 - 32 mmol/L _0 Calcium 8.9 - 10.3 mg/dL 8.9 9.2 9.0  Total Protein 6.5 - 8.1 g/dL 6.4(L) - -  Total Bilirubin 0.3 - 1.2 mg/dL 0.7 - -  Alkaline Phos 38 - 126 U/L 64 - -  AST 15 - 41 U/L 19 - -  ALT 0 - 44 U/L 13 - -   CBC Latest Ref Rng & Units 07/04/2019 07/01/2017 05/19/2017  WBC 4.0 - 10.5 K/uL 6.6 10.1 7.5  Hemoglobin 12.0 - 15.0 g/dL 13.1 13.2 12.3  Hematocrit 36 - 46 % 40.7 39.3 38.2  Platelets 150 - 400 K/uL 188 230 202    Lipid Panel Recent Labs    07/11/20 1003  CHOL 177  TRIG 156*  LDLCALC 96  HDL 54    HEMOGLOBIN A1C Lab Results  Component Value Date   HGBA1C 9.6 (A) 03/07/2020   MPG 189 01/05/2017   TSH Recent Labs    07/11/20 1003  TSH 5.960*   BNP    Component Value Date/Time   BNP 95.9 07/11/2020 1003    ProBNP    Component Value Date/Time   PROBNP 92.1 05/01/2011 2242     External labs:   06/26/2020: BUN 25, creatinine 1.05, EGFR 50,  sodium 141, potassium 4.7, CMP otherwise normal.  Medications and allergies   Allergies  Allergen Reactions  . Ciprofloxacin Other (See Comments)    Acute kidney failure  . Codeine Nausea And Vomiting  . Dilaudid [Hydromorphone Hcl] Nausea And Vomiting and Other (See Comments)    [name] sweating  . Hydromorphone   . Mysoline [Primidone] Nausea Only  . Oxycodone Nausea And Vomiting     Outpatient Medications Prior to Visit  Medication Sig Dispense Refill  . acetaminophen (TYLENOL) 325 MG tablet Take 2 tablets (650 mg total) by mouth every 6 (six) hours as needed for mild pain (or Fever >/= 101).    . B-D ULTRAFINE III SHORT PEN 31G X 8 MM MISC USE TWICE A DAY AS DIRECTED 200 each 3  . Dulaglutide (TRULICITY) 1.5 VI/1.5PP SOPN Inject 1.5 mg into the skin once a week. 6 mL 3  . DULoxetine (CYMBALTA) 20 MG capsule TAKE 1 CAPSULE BY MOUTH TWICE A DAY 180 capsule 2  . furosemide (LASIX) 20 MG tablet Take 20 mg by mouth daily.    [name] glucose blood test strip Use as instructed 200 each 12  . Insulin Lispro Prot & Lispro (HUMALOG MIX 75/25 KWIKPEN) (75-25) 100 UNIT/ML Kwikpen Inject 60 Units into the skin daily with breakfast. 30 mL 1  . losartan (COZAAR) 50 MG tablet TAKE 1/2 TABLET BY MOUTH DAILY 45 tablet 7  . MICROLET LANCETS MISC Use to monitor glucose levels BID 200 each 1  . MYRBETRIQ 50  MG TB24 tablet Take 50 mg by mouth daily.    . Needles & Syringes MISC 1 Package by Does not apply route 2 (two) times daily with a meal. 100 each 6  . oxybutynin (DITROPAN-XL) 10 MG 24 hr tablet Take 10 mg by mouth daily.    . propranolol (INDERAL) 10 MG tablet Take 1 tablet (10 mg total) by mouth 4 (four) times daily. 360 tablet 3   No facility-administered medications prior to visit.     Radiology:   No results found.  Cardiac Studies:   PCV ECHOCARDIOGRAM COMPLETE 21/22/4825 Normal LV systolic function with visual EF 50-55%. Left ventricle cavity is normal in size. Normal global wall  motion. Doppler evidence of grade I (impaired) diastolic dysfunction, elevated LAP. Mild left ventricular hypertrophy.  Trace aortic regurgitation. Mild tricuspid regurgitation. No evidence of pulmonary hypertension. RVSP measures 33 mmHg. No significant change compared to study dated 12/25/2016.  Zio Patch Extended out patient EKG monitoring 13 days starting 07/10/2020: Predominant rhythm is sinus rhythm with first-degree AV block. Patient activated event correlated with rare PVCs. There were occasional PACs, PVCs, occasional episodes of wide-complex rhythm suggestive of intermittent left bundle branch block. No high degree AV block, no atrial fibrillation or SVT.   Pulse oximetry 04/07/2017: Does not qualify for nocturnal oxygen per Medicare guidelines as patient did not experience qualifying desaturation events.  EKG:   EKG 07/10/2020: Sinus rhythm at a rate of 70 bpm with first-degree AV block, left atrial enlargement.  Normal axis.  Poor R wave progression, cannot exclude anterior septal infarct old. Borderline LVH by voltage criteria.  Compared to EKG 05/18/2018, no significant change.  EKG 01/04/2017: Sinus tachycardia at a rate of 128 bpm, normal axis, poor R wave progression, cannot exclude anterior septal infarct old.  No evidence of ischemia.  Normal QT interval.  Assessment     ICD-10-CM   1. Palpitations  R00.2   2. Orthostatic hypotension  I95.1   3. Supine hypertension  I10   4. Bilateral leg edema  R60.0      There are no discontinued medications.  No orders of the defined types were placed in this encounter.   Recommendations:   ROXENE ALVIAR is a 86 y.o. Caucasian female with history of uncontrolled type 2 insulin-dependent diabetes, mixed hyperlipidemia who was previously seen in our office for orthostatic hypotension and supine hypertension.  She was last seen 05/18/2018 by Dr. Einar Gip, at that time was doing well taking midodrine for [name] orthostatic hypotension, and  was instructed to follow-up as needed.  She is referred back to our office by PCP Dr. Milagros Evener for evaluation of leg swelling and tachycardia.  Patient presents for follow-up of palpitations and lower leg edema, as well as to discuss results of cardiac evaluation.  Patient's daughter is present at the bedside.  Reviewed and discussed recent lab work which revealed no elevation in BNP, however TSH was elevated.  Recommend follow-up of abnormal TSH with PCP.  Also reviewed and discussed results of echocardiogram which revealed normal heart function and mild left ventricular hypertrophy with trace aortic regurgitation and mild tricuspid regurgitation.  In view of normal BNP and echocardiogram I do not suspect patient's bilateral lower leg edema to be related to heart failure.  Rather suspect etiology is multifactorial including high salt intake and obesity.  Lipid profile testing showed elevated triglycerides likely secondary to uncontrolled diabetes and LDL of 96.  In view of diabetes mellitus would prefer to see LDL lower.  Of note patient is not presently on statin therapy although she has been in the past, however patient and daughter are unsure as to why patient stopped taking her statin medication.  Patient has an appointment to see her PCP in 4 days, will defer further lipid management to Dr. Milagros Evener.  However unless otherwise contraindicated, patient would benefit from initiation of statin therapy to further reduce cardiovascular risks.  Blood pressure was initially elevated in the office today, however upon recheck it had improved.  In view of supine hypertension with orthostatic hypotension, I am comfortable with patient's blood pressure today and will not make any changes to her medications.  Reviewed and discussed results of ambulatory 2-week cardiac monitoring, which revealed no significant arrhythmias although she did have symptomatic PVCs/PACs and intermittent wide-complex rhythm  suggestive of left bundle branch block.  Patient denies symptoms of dizziness, syncope, near syncope.  Counseled patient regarding signs and symptoms that would warrant further evaluation, potentially loop recorder implantation.  However at this time suspect patient's previous palpitations are related to PVCs/PACs and as she is presently asymptomatic we will monitor intermittent wide-complex rhythm.  Patient's heart rate is presently well controlled, however suspect irregular heart rhythm and tachycardia noted by PCP previously likely related to PVCs/PACs.  We will continue to monitor clinically.  Discussed at length with patient and her daughter regarding importance of dietary and lifestyle changes.  As well as discussed safety concerns regarding patient living alone.  Patient is hesitant to rely on her family for help as she prefers to remain independent, however family is willing and does provide as much support as possible.  She is presently stable from a cardiovascular standpoint. Follow up in 6 months, sooner if needed.    This was a 45-minute encounter with face-to-face counseling, medical records review, coordination of care, explanation of complex medical issues, complex medical decision making. Discussion of patient's safety and living situation, family support.    Alethia Berthold, PA-C 08/08/2020, 12:43 PM Office: [phone]

## 2020-08-08 ENCOUNTER — Ambulatory Visit: Payer: Medicare Other | Admitting: Student

## 2020-08-08 ENCOUNTER — Other Ambulatory Visit: Payer: Self-pay

## 2020-08-08 ENCOUNTER — Encounter: Payer: Self-pay | Admitting: Student

## 2020-08-08 VITALS — BP 142/84 | HR 86 | Resp 16 | Ht 68.0 in | Wt 237.6 lb

## 2020-08-08 DIAGNOSIS — I10 Essential (primary) hypertension: Secondary | ICD-10-CM

## 2020-08-08 DIAGNOSIS — R002 Palpitations: Secondary | ICD-10-CM

## 2020-08-08 DIAGNOSIS — R600 Localized edema: Secondary | ICD-10-CM

## 2020-08-08 DIAGNOSIS — I951 Orthostatic hypotension: Secondary | ICD-10-CM

## 2020-08-15 ENCOUNTER — Ambulatory Visit: Payer: Medicare Other | Admitting: Neurology

## 2020-08-15 ENCOUNTER — Encounter: Payer: Self-pay | Admitting: Neurology

## 2020-08-15 NOTE — Progress Notes (Deleted)
PATIENT: [name] DOB: 12/07/1932  REASON FOR VISIT: follow up HISTORY FROM: patient  HISTORY OF PRESENT ILLNESS: Today 08/15/20  [name] is a 86 year old female with history of essential tremor, on low-dose propanolol.  In January 2021, had a fall, MRI of the brain showed mild to moderate level small vessel disease without acute changes.  Since the fall, has had some positional vertigo.  HISTORY  12/19/2019 Dr. Jannifer Franklin: [name] is an 86 year old right-handed white female with a history of an essential tremor, she is treated with low-dose propranolol taking 20 mg twice daily.  She was evaluated in January 2021 after a fall, MRI of the brain was done showing a mild to moderate level small vessel disease without acute changes.  Since the fall, she has had some positional vertigo when lying down at night and when sitting up in the morning.  The episodes last only a few seconds and then resolved, the patient believes that the severity of the problem has gradually been improving.  She had physical therapy for her walking 2 years ago.  She last fell about a week ago when her foot got caught in the rug, she usually uses a walker around the house and is safe with doing this.  The patient has a daughter with an essential tremor as well.  REVIEW OF SYSTEMS: Out of a complete 14 system review of symptoms, the patient complains only of the following symptoms, and all other reviewed systems are negative.  ALLERGIES: Allergies  Allergen Reactions  . Ciprofloxacin Other (See Comments)    Acute kidney failure  . Codeine Nausea And Vomiting  . Dilaudid [Hydromorphone Hcl] Nausea And Vomiting and Other (See Comments)    Severe sweating  . Hydromorphone   . Mysoline [Primidone] Nausea Only  . Oxycodone Nausea And Vomiting    HOME MEDICATIONS: Outpatient Medications Prior to Visit  Medication Sig Dispense Refill  . acetaminophen (TYLENOL) 325 MG tablet Take 2 tablets (650 mg total) by  mouth every 6 (six) hours as needed for mild pain (or Fever >/= 101).    . B-D ULTRAFINE III SHORT PEN 31G X 8 MM MISC USE TWICE A DAY AS DIRECTED 200 each 3  . Dulaglutide (TRULICITY) 1.5 ZO/1.0RU SOPN Inject 1.5 mg into the skin once a week. 6 mL 3  . DULoxetine (CYMBALTA) 20 MG capsule TAKE 1 CAPSULE BY MOUTH TWICE A DAY 180 capsule 2  . furosemide (LASIX) 20 MG tablet Take 20 mg by mouth daily.    [name] glucose blood test strip Use as instructed 200 each 12  . Insulin Lispro Prot & Lispro (HUMALOG MIX 75/25 KWIKPEN) (75-25) 100 UNIT/ML Kwikpen Inject 60 Units into the skin daily with breakfast. 30 mL 1  . losartan (COZAAR) 50 MG tablet TAKE 1/2 TABLET BY MOUTH DAILY 45 tablet 7  . MICROLET LANCETS MISC Use to monitor glucose levels BID 200 each 1  . MYRBETRIQ 50 MG TB24 tablet Take 50 mg by mouth daily.    . Needles & Syringes MISC 1 Package by Does not apply route 2 (two) times daily with a meal. 100 each 6  . oxybutynin (DITROPAN-XL) 10 MG 24 hr tablet Take 10 mg by mouth daily.    . propranolol (INDERAL) 10 MG tablet Take 1 tablet (10 mg total) by mouth 4 (four) times daily. 360 tablet 3   No facility-administered medications prior to visit.    PAST MEDICAL HISTORY: Past Medical History:  Diagnosis Date  .  Anemia    as a teenager  . Arthritis   . Cancer ([place])    skin  . Cholelithiasis   . Chronic insomnia   . Chronic low back pain   . Complication of anesthesia   . DDD (degenerative disc disease), lumbar   . Diabetes mellitus    Iddm x 12 years  . Diabetic peripheral neuropathy ([place])   . Essential tremor 04/01/2015  . GERD (gastroesophageal reflux disease)   . Hx of acute renal failure 02/2014   admitted to [facility]  . Hx: UTI (urinary tract infection) 02/2014  . Hyperlipemia   . Hypertension   . Memory disorder 08/06/2017  . Obesity   . Peripheral edema   . Pneumonia    as a child  . PONV (postoperative nausea and vomiting)    pt states Zofran not that helpful in the  past, Phenergan works best for her  . Staph infection    1970's  . Umbilical hernia   . Wrist fracture    left October 2014    PAST SURGICAL HISTORY: Past Surgical History:  Procedure Laterality Date  . ABDOMINAL HYSTERECTOMY    . BACK SURGERY  2005  . CARDIAC CATHETERIZATION  2000   Dr Einar Gip  . CATARACT EXTRACTION Bilateral    w/ lens implants  . COLONOSCOPY  >10 years  . EYE SURGERY    . HERNIA REPAIR    . IR KYPHO LUMBAR INC FX REDUCE BONE BX UNI/BIL CANNULATION INC/IMAGING  05/18/2017  . IR KYPHO THORACIC WITH BONE BIOPSY  07/01/2017  . IR RADIOLOGIST EVAL & MGMT  06/14/2017  . IR RADIOLOGIST EVAL & MGMT  09/09/2017  . LUMBAR LAMINECTOMY/DECOMPRESSION MICRODISCECTOMY Left 03/16/2014   Procedure: LUMBAR LAMINECTOMY/DECOMPRESSION MICRODISCECTOMY 1 LEVEL  lumbar level three/four;  Surgeon: [name], MD;  Location: [place];  Service: Neurosurgery;  Laterality: Left;  . LUMBAR LAMINECTOMY/DECOMPRESSION MICRODISCECTOMY Right 02/06/2015   Procedure: Right L4-5 Microdiskectomy;  Surgeon: Leeroy Cha, MD;  Location: [place] [facility];  Service: Neurosurgery;  Laterality: Right;  Right L4-5 Microdiskectomy  . MASS EXCISION Right 06/15/2019   Procedure: EXCISIONAL BIOPSY RIGHT BUTTOCK;  Surgeon: Leighton Ruff, MD;  Location: [facility];  Service: General;  Laterality: Right;  . NECK SURGERY    . OOPHORECTOMY    . TOTAL KNEE ARTHROPLASTY Right     FAMILY HISTORY: Family History  Problem Relation Age of Onset  . Cancer Brother        prostate  . Heart disease Brother   . Alzheimer's disease Brother   . Diabetes Brother   . Stroke Mother   . Hypertension Mother   . COPD Brother   . Cancer Brother        Prostate cancer  . Hypertension Sister        Congestive heart failure  . Diabetes Sister   . Heart attack Son     SOCIAL HISTORY: Social History   Socioeconomic History  . Marital status: Widowed    Spouse name: Not on file  . Number of children: 3  .  Years of education: 51  . Highest education level: Not on file  Occupational History  . Occupation: Retired   Tobacco Use  . Smoking status: Never Smoker  . Smokeless tobacco: Never Used  Vaping Use  . Vaping Use: Never used  Substance and Sexual Activity  . Alcohol use: No  . Drug use: No  . Sexual activity: Not Currently  Other Topics Concern  .  Not on file  Social History Narrative   Patient lives at home alone.    Patient is widowed.    Patient has 3 children and 2 step children.    Patient is right handed.    Patient has a high school education and CNA certificate.     Patient is retired.    Patient does not drink caffeine.   Social Determinants of Health   Financial Resource Strain: Not on file  Food Insecurity: Not on file  Transportation Needs: Not on file  Physical Activity: Not on file  Stress: Not on file  Social Connections: Not on file  Intimate Partner Violence: Not on file      PHYSICAL EXAM  There were no vitals filed for this visit. There is no height or weight on file to calculate BMI.  Generalized: Well developed, in no acute distress   Neurological examination  Mentation: Alert oriented to time, place, history taking. Follows all commands speech and language fluent Cranial nerve II-XII: Pupils were equal round reactive to light. Extraocular movements were full, visual field were full on confrontational test. Facial sensation and strength were normal. Uvula tongue midline. Head turning and shoulder shrug  were normal and symmetric. Motor: The motor testing reveals 5 over 5 strength of all 4 extremities. Good symmetric motor tone is noted throughout.  Sensory: Sensory testing is intact to soft touch on all 4 extremities. No evidence of extinction is noted.  Coordination: Cerebellar testing reveals good finger-nose-finger and heel-to-shin bilaterally.  Gait and station: Gait is normal. Tandem gait is normal. Romberg is negative. No drift is seen.   Reflexes: Deep tendon reflexes are symmetric and normal bilaterally.   DIAGNOSTIC DATA (LABS, IMAGING, TESTING) - I reviewed patient records, labs, notes, testing and imaging myself where available.  Lab Results  Component Value Date   WBC 6.6 07/04/2019   HGB 13.1 07/04/2019   HCT 40.7 07/04/2019   MCV 87.5 07/04/2019   PLT 188 07/04/2019      Component Value Date/Time   NA 136 07/04/2019 0725   K 4.2 07/04/2019 0725   CL 103 07/04/2019 0725   CO2 22 07/04/2019 0725   GLUCOSE 268 (H) 07/04/2019 0725   BUN 22 07/04/2019 0725   CREATININE 0.90 07/04/2019 0725   CALCIUM 8.9 07/04/2019 0725   PROT 6.4 (L) 07/04/2019 0725   ALBUMIN 3.4 (L) 07/04/2019 0725   AST 19 07/04/2019 0725   ALT 13 07/04/2019 0725   ALKPHOS 64 07/04/2019 0725   BILITOT 0.7 07/04/2019 0725   GFRNONAA 59 (L) 07/04/2019 0725   GFRAA >60 07/04/2019 0725   Lab Results  Component Value Date   CHOL 177 07/11/2020   HDL 54 07/11/2020   LDLCALC 96 07/11/2020   TRIG 156 (H) 07/11/2020   CHOLHDL 4.0 05/16/2016   Lab Results  Component Value Date   HGBA1C 9.6 (A) 03/07/2020   Lab Results  Component Value Date   VITAMINB12 295 08/06/2017   Lab Results  Component Value Date   TSH 5.960 (H) 07/11/2020      ASSESSMENT AND PLAN 86 y.o. year old female  has a past medical history of Anemia, Arthritis, Cancer ([place]), Cholelithiasis, Chronic insomnia, Chronic low back pain, Complication of anesthesia, DDD (degenerative disc disease), lumbar, Diabetes mellitus, Diabetic peripheral neuropathy ([place]), Essential tremor (04/01/2015), GERD (gastroesophageal reflux disease), acute renal failure (02/2014), UTI (urinary tract infection) (02/2014), Hyperlipemia, Hypertension, Memory disorder (08/06/2017), Obesity, Peripheral edema, Pneumonia, PONV (postoperative nausea and vomiting), Staph infection,  Umbilical hernia, and Wrist fracture. here with ***   I spent 15 minutes with the patient. 50% of this time was  spent   Butler Denmark, [place], DNP 08/15/2020, 5:54 AM [facility] [address], [place],  [address] [phone]

## 2020-09-17 DIAGNOSIS — G250 Essential tremor: Secondary | ICD-10-CM | POA: Diagnosis not present

## 2020-09-17 DIAGNOSIS — E1143 Type 2 diabetes mellitus with diabetic autonomic (poly)neuropathy: Secondary | ICD-10-CM | POA: Diagnosis not present

## 2020-09-17 DIAGNOSIS — I10 Essential (primary) hypertension: Secondary | ICD-10-CM | POA: Diagnosis not present

## 2020-09-18 ENCOUNTER — Other Ambulatory Visit: Payer: Self-pay

## 2020-09-18 ENCOUNTER — Telehealth: Payer: Medicare Other | Admitting: Endocrinology

## 2020-09-18 DIAGNOSIS — E1122 Type 2 diabetes mellitus with diabetic chronic kidney disease: Secondary | ICD-10-CM

## 2020-09-18 DIAGNOSIS — N1831 Chronic kidney disease, stage 3a: Secondary | ICD-10-CM

## 2020-09-18 DIAGNOSIS — Z794 Long term (current) use of insulin: Secondary | ICD-10-CM

## 2020-09-18 NOTE — Progress Notes (Signed)
Tried to call pt 3x--no answer and [phone]--regarding appt today.

## 2020-10-02 DIAGNOSIS — L905 Scar conditions and fibrosis of skin: Secondary | ICD-10-CM | POA: Diagnosis not present

## 2020-10-02 DIAGNOSIS — Z85828 Personal history of other malignant neoplasm of skin: Secondary | ICD-10-CM | POA: Diagnosis not present

## 2020-10-02 DIAGNOSIS — D485 Neoplasm of uncertain behavior of skin: Secondary | ICD-10-CM | POA: Diagnosis not present

## 2020-10-02 DIAGNOSIS — L989 Disorder of the skin and subcutaneous tissue, unspecified: Secondary | ICD-10-CM | POA: Diagnosis not present

## 2020-12-07 ENCOUNTER — Other Ambulatory Visit: Payer: Self-pay | Admitting: Neurology

## 2020-12-11 ENCOUNTER — Other Ambulatory Visit: Payer: Self-pay | Admitting: Neurology

## 2021-01-04 DIAGNOSIS — R404 Transient alteration of awareness: Secondary | ICD-10-CM | POA: Diagnosis not present

## 2021-01-04 DIAGNOSIS — R410 Disorientation, unspecified: Secondary | ICD-10-CM | POA: Diagnosis not present

## 2021-01-04 DIAGNOSIS — E162 Hypoglycemia, unspecified: Secondary | ICD-10-CM | POA: Diagnosis not present

## 2021-01-04 DIAGNOSIS — Z743 Need for continuous supervision: Secondary | ICD-10-CM | POA: Diagnosis not present

## 2021-01-04 DIAGNOSIS — E161 Other hypoglycemia: Secondary | ICD-10-CM | POA: Diagnosis not present

## 2021-01-07 ENCOUNTER — Ambulatory Visit: Payer: Medicare Other | Admitting: Endocrinology

## 2021-01-21 ENCOUNTER — Other Ambulatory Visit: Payer: Self-pay | Admitting: Endocrinology

## 2021-01-23 ENCOUNTER — Other Ambulatory Visit: Payer: Self-pay

## 2021-01-23 ENCOUNTER — Ambulatory Visit (INDEPENDENT_AMBULATORY_CARE_PROVIDER_SITE_OTHER): Payer: Medicare Other | Admitting: Endocrinology

## 2021-01-23 VITALS — BP 120/60 | HR 75 | Ht 68.0 in | Wt 219.2 lb

## 2021-01-23 DIAGNOSIS — N1831 Chronic kidney disease, stage 3a: Secondary | ICD-10-CM

## 2021-01-23 DIAGNOSIS — E1122 Type 2 diabetes mellitus with diabetic chronic kidney disease: Secondary | ICD-10-CM | POA: Diagnosis not present

## 2021-01-23 DIAGNOSIS — Z794 Long term (current) use of insulin: Secondary | ICD-10-CM | POA: Diagnosis not present

## 2021-01-23 LAB — POCT GLYCOSYLATED HEMOGLOBIN (HGB A1C): Hemoglobin A1C: 7.9 % — AB (ref 4.0–5.6)

## 2021-01-23 MED ORDER — INSULIN LISPRO PROT & LISPRO (75-25 MIX) 100 UNIT/ML KWIKPEN: 45.0000 [IU] | PEN_INJECTOR | Freq: Every day | SUBCUTANEOUS | 1 refills | Status: DC

## 2021-01-23 MED ORDER — GLUCOSE BLOOD VI STRP: ORAL_STRIP | 12 refills | Status: DC

## 2021-01-23 MED ORDER — TRULICITY 3 MG/0.5ML ~~LOC~~ SOAJ: 3.0000 mg | SUBCUTANEOUS | 3 refills | Status: DC

## 2021-01-23 NOTE — Progress Notes (Signed)
Subjective:    Patient ID: Morgan [name], female    DOB: 1933-01-06, 87 y.o.   MRN: [id number]  HPI Pt returns for f/u of diabetes mellitus:  DM type: Insulin-requiring type 2 Dx'ed: 6712 Complications: PN and AN.  Therapy: insulin since 2010 GDM: never DKA: never [name] hypoglycemia: never Pancreatitis: never.  Pancreatic imaging: normal on 2015 MRI.  Other: She lives alone, and takes her own insulin; however, she is not a candidate for multiple daily injections, due to memory loss; She eats meals at 7-10 AM, 2 PM, and 7-8 PM.   Interval history: she brings a record of her fasting cbg's which I have reviewed today.  cbg varies from 33-201.  pt states she feels well in general.  Pt says she never misses the insulin.  She has hypoglycemia approx once per month.  1 episode was [name].  This happens after lunch was relayed.  She does not take PM insulin.   Past Medical History:  Diagnosis Date  . Anemia    as a teenager  . Arthritis   . Cancer ([place])    skin  . Cholelithiasis   . Chronic insomnia   . Chronic low back pain   . Complication of anesthesia   . DDD (degenerative disc disease), lumbar   . Diabetes mellitus    Iddm x 12 years  . Diabetic peripheral neuropathy ([place])   . Essential tremor 04/01/2015  . GERD (gastroesophageal reflux disease)   . Hx of acute renal failure 02/2014   admitted to [facility]  . Hx: UTI (urinary tract infection) 02/2014  . Hyperlipemia   . Hypertension   . Memory disorder 08/06/2017  . Obesity   . Peripheral edema   . Pneumonia    as a child  . PONV (postoperative nausea and vomiting)    pt states Zofran not that helpful in the past, Phenergan works best for her  . Staph infection    1970's  . Umbilical hernia   . Wrist fracture    left October 2014    Past Surgical History:  Procedure Laterality Date  . ABDOMINAL HYSTERECTOMY    . BACK SURGERY  2005  . CARDIAC CATHETERIZATION  2000   Dr Einar Gip  . CATARACT EXTRACTION Bilateral     w/ lens implants  . COLONOSCOPY  >10 years  . EYE SURGERY    . HERNIA REPAIR    . IR KYPHO LUMBAR INC FX REDUCE BONE BX UNI/BIL CANNULATION INC/IMAGING  05/18/2017  . IR KYPHO THORACIC WITH BONE BIOPSY  07/01/2017  . IR RADIOLOGIST EVAL & MGMT  06/14/2017  . IR RADIOLOGIST EVAL & MGMT  09/09/2017  . LUMBAR LAMINECTOMY/DECOMPRESSION MICRODISCECTOMY Left 03/16/2014   Procedure: LUMBAR LAMINECTOMY/DECOMPRESSION MICRODISCECTOMY 1 LEVEL  lumbar level three/four;  Surgeon: [name], MD;  Location: [place];  Service: Neurosurgery;  Laterality: Left;  . LUMBAR LAMINECTOMY/DECOMPRESSION MICRODISCECTOMY Right 02/06/2015   Procedure: Right L4-5 Microdiskectomy;  Surgeon: Leeroy Cha, MD;  Location: [place] [facility];  Service: Neurosurgery;  Laterality: Right;  Right L4-5 Microdiskectomy  . MASS EXCISION Right 06/15/2019   Procedure: EXCISIONAL BIOPSY RIGHT BUTTOCK;  Surgeon: Leighton Ruff, MD;  Location: [facility];  Service: General;  Laterality: Right;  . NECK SURGERY    . OOPHORECTOMY    . TOTAL KNEE ARTHROPLASTY Right     Social History   Socioeconomic History  . Marital status: Widowed    Spouse name: Not on file  . Number of  children: 3  . Years of education: 43  . Highest education level: Not on file  Occupational History  . Occupation: Retired   Tobacco Use  . Smoking status: Never Smoker  . Smokeless tobacco: Never Used  Vaping Use  . Vaping Use: Never used  Substance and Sexual Activity  . Alcohol use: No  . Drug use: No  . Sexual activity: Not Currently  Other Topics Concern  . Not on file  Social History Narrative   Patient lives at home alone.    Patient is widowed.    Patient has 3 children and 2 step children.    Patient is right handed.    Patient has a high school education and CNA certificate.     Patient is retired.    Patient does not drink caffeine.   Social Determinants of Health   Financial Resource Strain: Not on file  Food Insecurity:  Not on file  Transportation Needs: Not on file  Physical Activity: Not on file  Stress: Not on file  Social Connections: Not on file  Intimate Partner Violence: Not on file    Current Outpatient Medications on File Prior to Visit  Medication Sig Dispense Refill  . acetaminophen (TYLENOL) 325 MG tablet Take 2 tablets (650 mg total) by mouth every 6 (six) hours as needed for mild pain (or Fever >/= 101).    . B-D ULTRAFINE III SHORT PEN 31G X 8 MM MISC USE TWICE A DAY AS DIRECTED 200 each 3  . DULoxetine (CYMBALTA) 20 MG capsule TAKE 1 CAPSULE BY MOUTH TWICE A DAY 180 capsule 2  . furosemide (LASIX) 20 MG tablet Take 20 mg by mouth daily.    [name] losartan (COZAAR) 50 MG tablet TAKE 1/2 TABLET BY MOUTH DAILY 45 tablet 7  . MICROLET LANCETS MISC Use to monitor glucose levels BID 200 each 1  . MYRBETRIQ 50 MG TB24 tablet Take 50 mg by mouth daily.    . Needles & Syringes MISC 1 Package by Does not apply route 2 (two) times daily with a meal. 100 each 6  . oxybutynin (DITROPAN-XL) 10 MG 24 hr tablet Take 10 mg by mouth daily.    . propranolol (INDERAL) 10 MG tablet Take 1 tablet (10 mg total) by mouth 4 (four) times daily. 360 tablet 3   No current facility-administered medications on file prior to visit.    Allergies  Allergen Reactions  . Ciprofloxacin Other (See Comments)    Acute kidney failure  . Codeine Nausea And Vomiting  . Dilaudid [Hydromorphone Hcl] Nausea And Vomiting and Other (See Comments)    [name] sweating  . Hydromorphone   . Mysoline [Primidone] Nausea Only  . Oxycodone Nausea And Vomiting    Family History  Problem Relation Age of Onset  . Cancer Brother        prostate  . Heart disease Brother   . Alzheimer's disease Brother   . Diabetes Brother   . Stroke Mother   . Hypertension Mother   . COPD Brother   . Cancer Brother        Prostate cancer  . Hypertension Sister        Congestive heart failure  . Diabetes Sister   . Heart attack Son     BP 120/60  (BP Location: Right Arm, Patient Position: Sitting, Cuff Size: Large)   Pulse 75   Ht 5\' 8"  (1.727 m)   Wt 219 lb 3.2 oz (99.4 kg)   SpO2 95%  BMI 33.33 kg/m    Review of Systems Denies n/v    Objective:   Physical Exam VITAL SIGNS:  See vs page GENERAL: no distress Pulses: dorsalis pedis intact bilat.   MSK: no deformity of the feet CV: 2+ bilat leg edema, and vv's Skin:  no ulcer on the feet.  normal temp on the feet.  There is cyanosis of the distal feet bilaterally.   Neuro: sensation is intact to touch on the feet, but decreased from normal.     Lab Results  Component Value Date   HGBA1C 7.9 (A) 01/23/2021      Assessment & Plan:  Insulin-requiring type 2 DM: Hypoglycemia, [name], due to insulin: we'll favor GLP over insulin  Patient Instructions  I have sent a prescription to your pharmacy, to increase the Trulicity again, and: reduce the insulin to 60 units with breakfast.  On this type of insulin schedule, you should eat meals on a regular schedule.  If a meal is missed or significantly delayed, your blood sugar could go low.   check your blood sugar twice a day.  vary the time of day when you check, between before the 3 meals, and at bedtime.  also check if you have symptoms of your blood sugar being too high or too low.  please keep a record of the readings and bring it to your next appointment here (or you can bring the meter itself).  You can write it on any piece of paper.  please call us sooner if your blood sugar goes below 70, or if you have a lot of readings over 200.  Please come back for a follow-up appointment in January.

## 2021-01-23 NOTE — Patient Instructions (Addendum)
I have sent a prescription to your pharmacy, to increase the Trulicity again, and: reduce the insulin to 60 units with breakfast.  On this type of insulin schedule, you should eat meals on a regular schedule.  If a meal is missed or significantly delayed, your blood sugar could go low.   check your blood sugar twice a day.  vary the time of day when you check, between before the 3 meals, and at bedtime.  also check if you have symptoms of your blood sugar being too high or too low.  please keep a record of the readings and bring it to your next appointment here (or you can bring the meter itself).  You can write it on any piece of paper.  please call us sooner if your blood sugar goes below 70, or if you have a lot of readings over 200.  Please come back for a follow-up appointment in January.

## 2021-02-06 ENCOUNTER — Ambulatory Visit: Payer: Medicare Other | Admitting: Student

## 2021-02-25 ENCOUNTER — Ambulatory Visit: Payer: Medicare Other | Admitting: Endocrinology

## 2021-03-13 ENCOUNTER — Other Ambulatory Visit: Payer: Self-pay

## 2021-03-13 ENCOUNTER — Encounter: Payer: Self-pay | Admitting: Neurology

## 2021-03-13 ENCOUNTER — Ambulatory Visit (INDEPENDENT_AMBULATORY_CARE_PROVIDER_SITE_OTHER): Payer: Medicare Other | Admitting: Neurology

## 2021-03-13 VITALS — BP 182/80 | HR 80 | Ht 68.0 in | Wt 223.0 lb

## 2021-03-13 DIAGNOSIS — G250 Essential tremor: Secondary | ICD-10-CM | POA: Diagnosis not present

## 2021-03-13 NOTE — Progress Notes (Signed)
Reason for visit: Essential tremor  [name] is an 87 y.o. female  History of present illness:  [name] is an 87 year old right-handed white female who has a history of diabetes and a benign essential tremor.  The patient does have some discomfort in her feet associated with the diabetes, she takes low-dose duloxetine for this with some benefit.  She claimed that she does not sleep well at night but it is not because of discomfort but because her mind is racing.  The patient still has tremors, she takes 20 mg of propranolol twice daily.  She does have some difficulty feeding herself, she has difficulty with handwriting but her daughter is helping her write her checks and manage her bills.  She uses a walker for ambulation in part because of low back problems and because of arthritis in the knees.  She has had a fall since last seen, she had to call the fire department to come and help her get up because she could not stand up on her own.  She did not sustain any injury.  She returns to the office today for further evaluation.  Past Medical History:  Diagnosis Date   Anemia    as a teenager   Arthritis    Cancer ([place])    skin   Cholelithiasis    Chronic insomnia    Chronic low back pain    Complication of anesthesia    DDD (degenerative disc disease), lumbar    Diabetes mellitus    Iddm x 12 years   Diabetic peripheral neuropathy (HCC)    Essential tremor 04/01/2015   GERD (gastroesophageal reflux disease)    Hx of acute renal failure 02/2014   admitted to [facility]   Hx: UTI (urinary tract infection) 02/2014   Hyperlipemia    Hypertension    Memory disorder 08/06/2017   Obesity    Peripheral edema    Pneumonia    as a child   PONV (postoperative nausea and vomiting)    pt states Zofran not that helpful in the past, Phenergan works best for her   Staph infection    4431'V   Umbilical hernia    Wrist fracture    left October 2014    Past Surgical History:   Procedure Laterality Date   ABDOMINAL HYSTERECTOMY     BACK SURGERY  2005   CARDIAC CATHETERIZATION  2000   Dr Einar Gip   CATARACT EXTRACTION Bilateral    w/ lens implants   COLONOSCOPY  >10 years   EYE SURGERY     HERNIA REPAIR     IR KYPHO LUMBAR INC FX REDUCE BONE BX UNI/BIL CANNULATION INC/IMAGING  05/18/2017   IR KYPHO THORACIC WITH BONE BIOPSY  07/01/2017   IR RADIOLOGIST EVAL & MGMT  06/14/2017   IR RADIOLOGIST EVAL & MGMT  09/09/2017   LUMBAR LAMINECTOMY/DECOMPRESSION MICRODISCECTOMY Left 03/16/2014   Procedure: LUMBAR LAMINECTOMY/DECOMPRESSION MICRODISCECTOMY 1 LEVEL  lumbar level three/four;  Surgeon: [name], MD;  Location: [place];  Service: Neurosurgery;  Laterality: Left;   LUMBAR LAMINECTOMY/DECOMPRESSION MICRODISCECTOMY Right 02/06/2015   Procedure: Right L4-5 Microdiskectomy;  Surgeon: Leeroy Cha, MD;  Location: [place] [facility];  Service: Neurosurgery;  Laterality: Right;  Right L4-5 Microdiskectomy   MASS EXCISION Right 06/15/2019   Procedure: EXCISIONAL BIOPSY RIGHT BUTTOCK;  Surgeon: Leighton Ruff, MD;  Location: [place];  Service: General;  Laterality: Right;   NECK SURGERY     OOPHORECTOMY  TOTAL KNEE ARTHROPLASTY Right     Family History  Problem Relation Age of Onset   Cancer Brother        prostate   Heart disease Brother    Alzheimer's disease Brother    Diabetes Brother    Stroke Mother    Hypertension Mother    COPD Brother    Cancer Brother        Prostate cancer   Hypertension Sister        Congestive heart failure   Diabetes Sister    Heart attack Son     Social history:  reports that she has never smoked. She has never used smokeless tobacco. She reports that she does not drink alcohol and does not use drugs.    Allergies  Allergen Reactions   Ciprofloxacin Other (See Comments)    Acute kidney failure   Codeine Nausea And Vomiting   Dilaudid [Hydromorphone Hcl] Nausea And Vomiting and Other (See Comments)     Severe sweating   Hydromorphone    Mysoline [Primidone] Nausea Only   Oxycodone Nausea And Vomiting    Medications:  Prior to Admission medications   Medication Sig Start Date End Date Taking? Authorizing Provider  acetaminophen (TYLENOL) 325 MG tablet Take 2 tablets (650 mg total) by mouth every 6 (six) hours as needed for mild pain (or Fever >/= 101). 05/19/17  Yes Hosie Poisson, MD  B-D ULTRAFINE III SHORT PEN 31G X 8 MM MISC USE TWICE A DAY AS DIRECTED 06/13/20  Yes Renato Shin, MD  Dulaglutide (TRULICITY) 3 BM/8.4XL SOPN Inject 3 mg as directed once a week. 01/23/21  Yes Renato Shin, MD  DULoxetine (CYMBALTA) 20 MG capsule TAKE 1 CAPSULE BY MOUTH TWICE A DAY 02/14/19  Yes Kathrynn Ducking, MD  furosemide (LASIX) 20 MG tablet Take 20 mg by mouth daily. 07/01/20  Yes [provider]  glucose blood test strip Use as instructed 01/23/21  Yes Renato Shin, MD  Insulin Lispro Prot & Lispro (HUMALOG MIX 75/25 KWIKPEN) (75-25) 100 UNIT/ML Kwikpen Inject 45 Units into the skin daily with breakfast. 01/23/21  Yes Renato Shin, MD  losartan (COZAAR) 50 MG tablet TAKE 1/2 TABLET BY MOUTH DAILY 05/24/20  Yes Adrian Prows, MD  MICROLET LANCETS MISC Use to monitor glucose levels BID 07/22/18  Yes Renato Shin, MD  MYRBETRIQ 50 MG TB24 tablet Take 50 mg by mouth daily. 05/02/20  Yes [provider]  Needles & Syringes MISC 1 Package by Does not apply route 2 (two) times daily with a meal. 12/07/19  Yes Renato Shin, MD  oxybutynin (DITROPAN-XL) 10 MG 24 hr tablet Take 10 mg by mouth daily. 06/06/20  Yes [provider]  propranolol (INDERAL) 10 MG tablet Take 1 tablet (10 mg total) by mouth 4 (four) times daily. 07/10/20  Yes Kathrynn Ducking, MD    ROS:  Out of a complete 14 system review of symptoms, the patient complains only of the following symptoms, and all other reviewed systems are negative.  Tremor Low back pain Joint pain Mild memory problems  Blood pressure (!)  182/80, pulse 80, height 5\' 8"  (1.727 m), weight 223 lb (101.2 kg).  Physical Exam  General: The patient is alert and cooperative at the time of the examination.  The patient is moderately obese.  Skin: 1-2+ edema below the knees is seen bilaterally.   Neurologic Exam  Mental status: The patient is alert and oriented x 3 at the time of the examination. The  patient has apparent normal recent and remote memory, with an apparently normal attention span and concentration ability.   Cranial nerves: Facial symmetry is present. Speech is normal, no aphasia or dysarthria is noted. Extraocular movements are full. Visual fields are full.  Motor: The patient has good strength in all 4 extremities.  Sensory examination: Soft touch sensation is symmetric on the face, arms, and legs.  Coordination: The patient has good finger-nose-finger and heel-to-shin bilaterally.  Very minimal tremor seen with finger-nose-finger bilaterally.  When drawing a spiral, mild tremor is translated into the handwriting.  Gait and station: The patient has a slightly wide-based gait, she usually walks with a walker.  When using the walker, she has good stability and good turns.  Romberg is negative but is unsteady.  Reflexes: Deep tendon reflexes are symmetric, but are depressed.   Assessment/Plan:  1.  Essential tremor  2.  Diabetes, diabetic neuropathy  The patient will continue the Cymbalta and propranolol.  She will follow-up here in about 8 months, in the future, she can be seen and followed by Dr. Brett Fairy.  Jill Alexanders MD 03/13/2021 4:07 PM  [facility] [address] [place], [place] [address]  Phone [phone] Fax [phone]

## 2021-07-31 ENCOUNTER — Other Ambulatory Visit: Payer: Self-pay | Admitting: Cardiology

## 2021-07-31 ENCOUNTER — Other Ambulatory Visit: Payer: Self-pay | Admitting: Neurology

## 2021-07-31 DIAGNOSIS — I10 Essential (primary) hypertension: Secondary | ICD-10-CM

## 2021-07-31 DIAGNOSIS — G250 Essential tremor: Secondary | ICD-10-CM

## 2021-09-25 ENCOUNTER — Ambulatory Visit: Payer: Medicare Other | Admitting: Endocrinology

## 2021-10-02 ENCOUNTER — Encounter (HOSPITAL_COMMUNITY): Payer: Self-pay

## 2021-10-02 ENCOUNTER — Emergency Department (HOSPITAL_COMMUNITY): Payer: Medicare Other

## 2021-10-02 ENCOUNTER — Inpatient Hospital Stay (HOSPITAL_COMMUNITY)
Admission: EM | Admit: 2021-10-02 | Discharge: 2021-10-05 | DRG: 091 | Disposition: A | Discharge status: Home / Self Care | Payer: Medicare Other | Attending: Internal Medicine | Admitting: Internal Medicine

## 2021-10-02 ENCOUNTER — Other Ambulatory Visit: Payer: Self-pay

## 2021-10-02 DIAGNOSIS — T50905A Adverse effect of unspecified drugs, medicaments and biological substances, initial encounter: Secondary | ICD-10-CM

## 2021-10-02 DIAGNOSIS — E118 Type 2 diabetes mellitus with unspecified complications: Secondary | ICD-10-CM | POA: Diagnosis present

## 2021-10-02 DIAGNOSIS — Z87440 Personal history of urinary (tract) infections: Secondary | ICD-10-CM

## 2021-10-02 DIAGNOSIS — G250 Essential tremor: Secondary | ICD-10-CM | POA: Diagnosis present

## 2021-10-02 DIAGNOSIS — I129 Hypertensive chronic kidney disease with stage 1 through stage 4 chronic kidney disease, or unspecified chronic kidney disease: Secondary | ICD-10-CM | POA: Diagnosis present

## 2021-10-02 DIAGNOSIS — T424X5A Adverse effect of benzodiazepines, initial encounter: Secondary | ICD-10-CM | POA: Diagnosis present

## 2021-10-02 DIAGNOSIS — I10 Essential (primary) hypertension: Secondary | ICD-10-CM | POA: Diagnosis present

## 2021-10-02 DIAGNOSIS — E669 Obesity, unspecified: Secondary | ICD-10-CM | POA: Diagnosis present

## 2021-10-02 DIAGNOSIS — R413 Other amnesia: Secondary | ICD-10-CM | POA: Diagnosis present

## 2021-10-02 DIAGNOSIS — E785 Hyperlipidemia, unspecified: Secondary | ICD-10-CM | POA: Diagnosis present

## 2021-10-02 DIAGNOSIS — M1990 Unspecified osteoarthritis, unspecified site: Secondary | ICD-10-CM | POA: Diagnosis present

## 2021-10-02 DIAGNOSIS — R531 Weakness: Secondary | ICD-10-CM | POA: Diagnosis not present

## 2021-10-02 DIAGNOSIS — K219 Gastro-esophageal reflux disease without esophagitis: Secondary | ICD-10-CM | POA: Diagnosis present

## 2021-10-02 DIAGNOSIS — Z794 Long term (current) use of insulin: Secondary | ICD-10-CM

## 2021-10-02 DIAGNOSIS — E1122 Type 2 diabetes mellitus with diabetic chronic kidney disease: Secondary | ICD-10-CM | POA: Diagnosis present

## 2021-10-02 DIAGNOSIS — Z9842 Cataract extraction status, left eye: Secondary | ICD-10-CM

## 2021-10-02 DIAGNOSIS — N1831 Chronic kidney disease, stage 3a: Secondary | ICD-10-CM | POA: Diagnosis present

## 2021-10-02 DIAGNOSIS — Z7985 Long-term (current) use of injectable non-insulin antidiabetic drugs: Secondary | ICD-10-CM

## 2021-10-02 DIAGNOSIS — Z9181 History of falling: Secondary | ICD-10-CM

## 2021-10-02 DIAGNOSIS — Z9841 Cataract extraction status, right eye: Secondary | ICD-10-CM

## 2021-10-02 DIAGNOSIS — F5104 Psychophysiologic insomnia: Secondary | ICD-10-CM | POA: Diagnosis present

## 2021-10-02 DIAGNOSIS — Z881 Allergy status to other antibiotic agents status: Secondary | ICD-10-CM

## 2021-10-02 DIAGNOSIS — Z8619 Personal history of other infectious and parasitic diseases: Secondary | ICD-10-CM

## 2021-10-02 DIAGNOSIS — Z888 Allergy status to other drugs, medicaments and biological substances status: Secondary | ICD-10-CM

## 2021-10-02 DIAGNOSIS — W19XXXA Unspecified fall, initial encounter: Secondary | ICD-10-CM

## 2021-10-02 DIAGNOSIS — I455 Other specified heart block: Secondary | ICD-10-CM | POA: Diagnosis present

## 2021-10-02 DIAGNOSIS — R2689 Other abnormalities of gait and mobility: Secondary | ICD-10-CM | POA: Diagnosis present

## 2021-10-02 DIAGNOSIS — G8929 Other chronic pain: Secondary | ICD-10-CM | POA: Diagnosis present

## 2021-10-02 DIAGNOSIS — Z885 Allergy status to narcotic agent status: Secondary | ICD-10-CM

## 2021-10-02 DIAGNOSIS — N179 Acute kidney failure, unspecified: Secondary | ICD-10-CM | POA: Diagnosis present

## 2021-10-02 DIAGNOSIS — Z79899 Other long term (current) drug therapy: Secondary | ICD-10-CM

## 2021-10-02 DIAGNOSIS — Z90710 Acquired absence of both cervix and uterus: Secondary | ICD-10-CM

## 2021-10-02 DIAGNOSIS — R32 Unspecified urinary incontinence: Secondary | ICD-10-CM | POA: Diagnosis present

## 2021-10-02 DIAGNOSIS — R270 Ataxia, unspecified: Secondary | ICD-10-CM | POA: Diagnosis not present

## 2021-10-02 DIAGNOSIS — U071 COVID-19: Secondary | ICD-10-CM | POA: Diagnosis present

## 2021-10-02 DIAGNOSIS — F0390 Unspecified dementia without behavioral disturbance: Secondary | ICD-10-CM | POA: Diagnosis present

## 2021-10-02 DIAGNOSIS — G928 Other toxic encephalopathy: Secondary | ICD-10-CM | POA: Diagnosis present

## 2021-10-02 DIAGNOSIS — E860 Dehydration: Secondary | ICD-10-CM | POA: Diagnosis present

## 2021-10-02 DIAGNOSIS — Z8249 Family history of ischemic heart disease and other diseases of the circulatory system: Secondary | ICD-10-CM

## 2021-10-02 DIAGNOSIS — I351 Nonrheumatic aortic (valve) insufficiency: Secondary | ICD-10-CM | POA: Diagnosis present

## 2021-10-02 DIAGNOSIS — I5189 Other ill-defined heart diseases: Secondary | ICD-10-CM

## 2021-10-02 DIAGNOSIS — Z6832 Body mass index (BMI) 32.0-32.9, adult: Secondary | ICD-10-CM

## 2021-10-02 DIAGNOSIS — Z833 Family history of diabetes mellitus: Secondary | ICD-10-CM

## 2021-10-02 DIAGNOSIS — Z961 Presence of intraocular lens: Secondary | ICD-10-CM | POA: Diagnosis present

## 2021-10-02 DIAGNOSIS — G9340 Encephalopathy, unspecified: Secondary | ICD-10-CM | POA: Diagnosis present

## 2021-10-02 DIAGNOSIS — E538 Deficiency of other specified B group vitamins: Secondary | ICD-10-CM | POA: Diagnosis present

## 2021-10-02 DIAGNOSIS — E1142 Type 2 diabetes mellitus with diabetic polyneuropathy: Secondary | ICD-10-CM | POA: Diagnosis present

## 2021-10-02 LAB — COMPREHENSIVE METABOLIC PANEL
ALT: 13 U/L (ref 0–44)
AST: 17 U/L (ref 15–41)
Albumin: 3.9 g/dL (ref 3.5–5.0)
Alkaline Phosphatase: 74 U/L (ref 38–126)
Anion gap: 7 (ref 5–15)
BUN: 29 mg/dL — ABNORMAL HIGH (ref 8–23)
CO2: 23 mmol/L (ref 22–32)
Calcium: 8.8 mg/dL — ABNORMAL LOW (ref 8.9–10.3)
Chloride: 104 mmol/L (ref 98–111)
Creatinine, Ser: 1.39 mg/dL — ABNORMAL HIGH (ref 0.44–1.00)
GFR, Estimated: 37 mL/min — ABNORMAL LOW (ref >60)
Glucose, Bld: 169 mg/dL — ABNORMAL HIGH (ref 70–99)
Potassium: 4.9 mmol/L (ref 3.5–5.1)
Sodium: 134 mmol/L — ABNORMAL LOW (ref 135–145)
Total Bilirubin: 0.8 mg/dL (ref 0.3–1.2)
Total Protein: 7.4 g/dL (ref 6.5–8.1)

## 2021-10-02 LAB — CBC WITH DIFFERENTIAL/PLATELET
Abs Immature Granulocytes: 0.02 10*3/uL (ref 0.00–0.07)
Basophils Absolute: 0 10*3/uL (ref 0.0–0.1)
Basophils Relative: 0 %
Eosinophils Absolute: 0.1 10*3/uL (ref 0.0–0.5)
Eosinophils Relative: 1 %
HCT: 36.9 % (ref 36.0–46.0)
Hemoglobin: 12.1 g/dL (ref 12.0–15.0)
Immature Granulocytes: 0 %
Lymphocytes Relative: 17 %
Lymphs Abs: 1.6 10*3/uL (ref 0.7–4.0)
MCH: 28.7 pg (ref 26.0–34.0)
MCHC: 32.8 g/dL (ref 30.0–36.0)
MCV: 87.4 fL (ref 80.0–100.0)
Monocytes Absolute: 0.7 10*3/uL (ref 0.1–1.0)
Monocytes Relative: 7 %
Neutro Abs: 6.7 10*3/uL (ref 1.7–7.7)
Neutrophils Relative %: 75 %
Platelets: 219 10*3/uL (ref 150–400)
RBC: 4.22 MIL/uL (ref 3.87–5.11)
RDW: 13.6 % (ref 11.5–15.5)
WBC: 9.1 10*3/uL (ref 4.0–10.5)
nRBC: 0 % (ref 0.0–0.2)

## 2021-10-02 LAB — URINALYSIS, ROUTINE W REFLEX MICROSCOPIC
Bilirubin Urine: NEGATIVE
Glucose, UA: 50 mg/dL — AB
Hgb urine dipstick: NEGATIVE
Ketones, ur: NEGATIVE mg/dL
Leukocytes,Ua: NEGATIVE
Nitrite: NEGATIVE
Protein, ur: NEGATIVE mg/dL
Specific Gravity, Urine: 1.009 (ref 1.005–1.030)
pH: 6 (ref 5.0–8.0)

## 2021-10-02 MED ORDER — LORAZEPAM 2 MG/ML IJ SOLN
1.0000 mg | Freq: Once | INTRAMUSCULAR | Status: AC
  Administered 2021-10-02: 1 mg via INTRAVENOUS
  Filled 2021-10-02: qty 1

## 2021-10-02 MED ORDER — PROPRANOLOL HCL 20 MG PO TABS
10.0000 mg | ORAL_TABLET | Freq: Four times a day (QID) | ORAL | Status: DC
  Administered 2021-10-03: 10 mg via ORAL
  Filled 2021-10-02: qty 1

## 2021-10-02 MED ORDER — SODIUM CHLORIDE 0.9 % IV BOLUS
500.0000 mL | Freq: Once | INTRAVENOUS | Status: AC
  Administered 2021-10-02: 500 mL via INTRAVENOUS

## 2021-10-02 MED ORDER — SODIUM CHLORIDE 0.9 % IV SOLN: INTRAVENOUS | Status: DC

## 2021-10-02 NOTE — ED Provider Triage Note (Signed)
Emergency Medicine Provider Triage Evaluation Note  [name] , a 88 y.o. female  was evaluated in triage.  Pt complains of generalized weakness, urinary symptoms that have been ongoing for an unknown time.  Patient is currently taking care of by her granddaughter who sent her to the ER.  Patient reports feeling overall "washed out ".  Review of Systems  Positive: Urinary symptoms, weakness Negative: Fever, abdominal pain  Physical Exam  BP (!) 181/71 (BP Location: Left Arm)    Pulse 83    Temp 97.7 F (36.5 C) (Oral)    Resp 18    Ht 5\' 9"  (1.753 m)    Wt 99.8 kg    SpO2 96%    BMI 32.49 kg/m  Gen:   Awake, no distress   Resp:  Normal effort  MSK:   Moves extremities without difficulty  Other:    Medical Decision Making  Medically screening exam initiated at 1:51 PM.  Appropriate orders placed.  LEIRA REGINO was informed that the remainder of the evaluation will be completed by another provider, this initial triage assessment does not replace that evaluation, and the importance of remaining in the ED until their evaluation is complete.     Janeece Fitting, PA-C 10/02/21 1358

## 2021-10-02 NOTE — ED Notes (Signed)
Pure wick in place to obtain urine specimen.  Pt resting quietly at this time.

## 2021-10-02 NOTE — ED Notes (Signed)
Dr. Rogene Houston updated on patients status, unchanged behavior and responses.

## 2021-10-02 NOTE — ED Triage Notes (Addendum)
Per EMS- patient c/o generalized weakness, urinary incontinence, recent falls.  Patient's family denies blood thinners, hitting her head, or having fever. Patient is alert and oriented x 4. Family reports a history of orthostatic hypotension.  Patient added that she was Covid + 2 weeks ago.

## 2021-10-02 NOTE — ED Notes (Addendum)
After first dose of ativan, patients visitor stated she became more agitated, calling out for [name], reaching for the sky, unable to console. Dr. Rogene Houston at beside and gave a verbal order for 1mg  IV ativan. Patient still agitated, family and MRI at bedside. Patient is unable to remain still despite 2mg  IV ativan administration.

## 2021-10-02 NOTE — ED Provider Notes (Addendum)
[place] DEPT Provider Note   CSN: [id number] Arrival date & time: 10/02/21  1333     History  Chief Complaint  Patient presents with   Weakness   Fall   Urinary Incontinence   Dizziness    [name] is a 88 y.o. female.  Patient brought in by EMS.  Family is concerned about generalized weakness being unsteady on her feet concerned that she may have a urinary tract infection.  Patient's had frequent falls over the past 2 weeks.  Patient was diagnosed with COVID about a month ago.  Patient does complain of some left shoulder pain.  Some left hip pain.  Old bruise to the left side of face.  [name] reports a history of orthostatic hypotension.  Past medical history significant for hypertension hyperlipidemia chronic low back pain diabetes diabetic peripheral neuropathy essential tremor and history of some dementia.      Home Medications Prior to Admission medications   Medication Sig Start Date End Date Taking? Authorizing Provider  acetaminophen (TYLENOL) 325 MG tablet Take 2 tablets (650 mg total) by mouth every 6 (six) hours as needed for mild pain (or Fever >/= 101). 05/19/17   Hosie Poisson, MD  B-D ULTRAFINE III SHORT PEN 31G X 8 MM MISC USE TWICE A DAY AS DIRECTED 06/13/20   Renato Shin, MD  Dulaglutide (TRULICITY) 3 AC/1.6SA SOPN Inject 3 mg as directed once a week. 01/23/21   Renato Shin, MD  DULoxetine (CYMBALTA) 20 MG capsule TAKE 1 CAPSULE BY MOUTH TWICE A DAY 02/14/19   Kathrynn Ducking, MD  furosemide (LASIX) 20 MG tablet Take 20 mg by mouth daily. 07/01/20   [provider]  glucose blood test strip Use as instructed 01/23/21   Renato Shin, MD  Insulin Lispro Prot & Lispro (HUMALOG MIX 75/25 KWIKPEN) (75-25) 100 UNIT/ML Kwikpen Inject 45 Units into the skin daily with breakfast. 01/23/21   Renato Shin, MD  losartan (COZAAR) 50 MG tablet TAKE 1/2 TABLET BY MOUTH EVERY DAY 08/04/21   Cantwell, Celeste C, PA-C  MICROLET  LANCETS MISC Use to monitor glucose levels BID 07/22/18   Renato Shin, MD  MYRBETRIQ 50 MG TB24 tablet Take 50 mg by mouth daily. 05/02/20   [provider]  Needles & Syringes MISC 1 Package by Does not apply route 2 (two) times daily with a meal. 12/07/19   Renato Shin, MD  oxybutynin (DITROPAN-XL) 10 MG 24 hr tablet Take 10 mg by mouth daily. 06/06/20   [provider]  propranolol (INDERAL) 10 MG tablet TAKE 1 TABLET BY MOUTH 4 TIMES DAILY. 08/04/21   Penumalli, Earlean Polka, MD      Allergies    Ativan [lorazepam], Ciprofloxacin, Codeine, Dilaudid [hydromorphone hcl], Hydromorphone, Mysoline [primidone], and Oxycodone    Review of Systems   Review of Systems  Unable to perform ROS: Dementia   Physical Exam Updated Vital Signs BP (!) 146/64    Pulse 90    Temp 97.7 F (36.5 C) (Oral)    Resp (!) 22    Ht 1.753 m (5\' 9" )    Wt 99.8 kg    SpO2 91%    BMI 32.49 kg/m  Physical Exam Vitals and nursing note reviewed.  Constitutional:      General: She is not in acute distress.    Appearance: Normal appearance. She is well-developed. She is not ill-appearing.  HENT:     Head: Normocephalic and atraumatic.     Comments: Old  bruise to left side of face.  Very faint. Eyes:     Extraocular Movements: Extraocular movements intact.     Conjunctiva/sclera: Conjunctivae normal.     Pupils: Pupils are equal, round, and reactive to light.  Cardiovascular:     Rate and Rhythm: Normal rate and regular rhythm.     Heart sounds: No murmur heard. Pulmonary:     Effort: Pulmonary effort is normal. No respiratory distress.     Breath sounds: Normal breath sounds.  Abdominal:     Palpations: Abdomen is soft.     Tenderness: There is no abdominal tenderness.  Musculoskeletal:        General: No swelling, tenderness or deformity.     Cervical back: Neck supple.  Skin:    General: Skin is warm and dry.     Capillary Refill: Capillary refill takes less than 2 seconds.   Neurological:     General: No focal deficit present.     Mental Status: She is alert.     Cranial Nerves: No cranial nerve deficit.     Sensory: No sensory deficit.     Motor: No weakness.     Comments: Patient will follow commands.  But is confused.  Good movement of all extremities upper and lower.  Some tremor.  But no drift.  Cranial nerves grossly intact.  Psychiatric:        Mood and Affect: Mood normal.    ED Results / Procedures / Treatments   Labs (all labs ordered are listed, but only abnormal results are displayed) Labs Reviewed  COMPREHENSIVE METABOLIC PANEL - Abnormal; Notable for the following components:      Result Value   Sodium 134 (*)    Glucose, Bld 169 (*)    BUN 29 (*)    Creatinine, Ser 1.39 (*)    Calcium 8.8 (*)    GFR, Estimated 37 (*)    All other components within normal limits  URINALYSIS, ROUTINE W REFLEX MICROSCOPIC - Abnormal; Notable for the following components:   Color, Urine STRAW (*)    Glucose, UA 50 (*)    All other components within normal limits  URINE CULTURE  RESP PANEL BY RT-PCR (FLU A&B, COVID) ARPGX2  CBC WITH DIFFERENTIAL/PLATELET    EKG EKG Interpretation  Date/Time:  Thursday October 02 2021 13:48:05 EST Ventricular Rate:  84 PR Interval:  229 QRS Duration: 78 QT Interval:  369 QTC Calculation: 437 R Axis:   -11 Text Interpretation: Sinus rhythm Prolonged PR interval Probable left atrial enlargement Left ventricular hypertrophy Anteroseptal infarct, old Confirmed by Fredia Sorrow [phone]) on 10/02/2021 4:46:12 PM  Radiology CT Head Wo Contrast  Result Date: 10/02/2021 CLINICAL DATA:  Trauma, generalized weakness EXAM: CT HEAD WITHOUT CONTRAST TECHNIQUE: Contiguous axial images were obtained from the base of the skull through the vertex without intravenous contrast. RADIATION DOSE REDUCTION: This exam was performed according to the departmental dose-optimization program which includes automated exposure control,  adjustment of the mA and/or kV according to patient size and/or use of iterative reconstruction technique. COMPARISON:  12/23/2016 FINDINGS: Brain: No acute intracranial findings are seen. Cortical sulci are prominent. There is no shift of midline structures. There is decreased density in the periventricular white matter. There are small calcifications in the basal ganglia on both sides. Vascular: Unremarkable. Skull: No fracture is seen in the calvarium. Sinuses/Orbits: Unremarkable. Other: No significant interval changes are noted. IMPRESSION: No acute intracranial findings are seen in noncontrast CT brain. Atrophy. Small-vessel disease. Electronically  Signed   By: Elmer Picker M.D.   On: 10/02/2021 18:11   DG Chest Port 1 View  Result Date: 10/02/2021 CLINICAL DATA:  Generalized weakness, COVID positive EXAM: PORTABLE CHEST 1 VIEW COMPARISON:  05/14/2017 FINDINGS: The heart size and mediastinal contours are within normal limits. Both lungs are clear. There is previous surgical fusion in the lower cervical spine. IMPRESSION: No active disease. Electronically Signed   By: Elmer Picker M.D.   On: 10/02/2021 17:12   DG Shoulder Left  Result Date: 10/02/2021 CLINICAL DATA:  Left shoulder pain after hitting her shoulder on a counter in a fall. EXAM: LEFT SHOULDER - 2+ VIEW COMPARISON:  Portable chest obtained earlier today. FINDINGS: No fracture or dislocation. Mild acromioclavicular spur formation, mild greater tuberosity hyperostosis and minimal inferior glenohumeral spur formation. IMPRESSION: 1. No fracture or dislocation. 2. Mild degenerative changes. Electronically Signed   By: Claudie Revering M.D.   On: 10/02/2021 17:58   DG Hips Bilat W or Wo Pelvis 3-4 Views  Result Date: 10/02/2021 CLINICAL DATA:  Bilateral hip pain, greater on the left, following multiple falls over several days. EXAM: DG HIP (WITH OR WITHOUT PELVIS) 3-4V BILAT COMPARISON:  Abdomen and pelvis CT obtained without  intravenous contrast on 07/04/2019 FINDINGS: Stable appearance of the hips with no fracture or dislocation seen. Lower lumbar spine degenerative changes are noted. IMPRESSION: No fracture or dislocation. Electronically Signed   By: Claudie Revering M.D.   On: 10/02/2021 17:57    Procedures Procedures    Medications Ordered in ED Medications  0.9 %  sodium chloride infusion ( Intravenous New Bag/Given 10/02/21 1929)  propranolol (INDERAL) tablet 10 mg (10 mg Oral Patient Refused/Not Given 10/02/21 2215)  sodium chloride 0.9 % bolus 500 mL (0 mLs Intravenous Stopped 10/02/21 1957)  LORazepam (ATIVAN) injection 1 mg (1 mg Intravenous Given 10/02/21 2022)  LORazepam (ATIVAN) injection 1 mg (1 mg Intravenous Given 10/02/21 2054)    ED Course/ Medical Decision Making/ A&P                           Medical Decision Making Amount and/or Complexity of Data Reviewed Radiology: ordered.  Risk Prescription drug management.   Initial work-up without any significant findings.  No leukocytosis.  White blood cell count 9.1.  Hemoglobin 12.1.  Complete metabolic panel sodium slightly low at 134.  Glucose 169.  BUN 29 creatinine 1.39 for a GFR 37 a little bit worse than baseline.  Patient may have some dehydration.  We will give 500 cc bolus and 100 cc an hour.  Urinalysis negative so no signs of urinary tract infection.  CT head no acute intracranial findings.  No skull fracture.  X-rays of both hips no fracture or dislocation x-ray of the left shoulder no fracture dislocation x-ray of the chest no active disease.   EKG without any acute findings.  Aced on the history of her being unsteady on her feet.  I will going get MRI brain to rule out any infarct abnormality.  Or acute infarct.  If negative patient can be discharged back home.  Patient certainly is showing evidence of some confusion here.  Will most likely need Ativan for the MRI.  Patient was anxious about having the MRI.  Ativan was given 1 mg IV  patient got extremely agitated.  Gave another milligram of Ativan IV and patient remained agitated.  Questionable adverse reaction to the Ativan certainly not an allergic reaction.  Patient's family states that she is fallen multiple times in the last 48 hours.  Gross neuro exam though without any significant neurodeficits.  Will discuss with hospitalist for consideration for admission.  I do not think we will ever get patient through an MRI based on today's experience.  Patient still having some adverse reaction to the medication.  We will discussed with hospitalist consideration for admission and observation for this.  Final Clinical Impression(s) / ED Diagnoses Final diagnoses:  Fall, initial encounter  Dehydration  Medication reaction, initial encounter    Rx / DC Orders ED Discharge Orders     None         Fredia Sorrow, MD 10/02/21 Artist Pais    Fredia Sorrow, MD 10/02/21 Artist Pais    Fredia Sorrow, MD 10/02/21 2244

## 2021-10-02 NOTE — ED Notes (Addendum)
Patient has had a change in mental status. Patient very anxious, aggressive, sweaty, not cooperating, not following commands. Changes began after ativan administration. Dr. Rogene Houston at bedside.

## 2021-10-03 ENCOUNTER — Inpatient Hospital Stay (HOSPITAL_COMMUNITY): Payer: Medicare Other

## 2021-10-03 ENCOUNTER — Encounter (HOSPITAL_COMMUNITY): Payer: Self-pay | Admitting: Family Medicine

## 2021-10-03 DIAGNOSIS — R011 Cardiac murmur, unspecified: Secondary | ICD-10-CM | POA: Diagnosis not present

## 2021-10-03 DIAGNOSIS — N1831 Chronic kidney disease, stage 3a: Secondary | ICD-10-CM | POA: Diagnosis present

## 2021-10-03 DIAGNOSIS — R270 Ataxia, unspecified: Secondary | ICD-10-CM

## 2021-10-03 DIAGNOSIS — R531 Weakness: Secondary | ICD-10-CM | POA: Diagnosis present

## 2021-10-03 DIAGNOSIS — G250 Essential tremor: Secondary | ICD-10-CM | POA: Diagnosis present

## 2021-10-03 DIAGNOSIS — Z8619 Personal history of other infectious and parasitic diseases: Secondary | ICD-10-CM | POA: Diagnosis not present

## 2021-10-03 DIAGNOSIS — W19XXXA Unspecified fall, initial encounter: Secondary | ICD-10-CM | POA: Diagnosis not present

## 2021-10-03 DIAGNOSIS — E538 Deficiency of other specified B group vitamins: Secondary | ICD-10-CM | POA: Diagnosis present

## 2021-10-03 DIAGNOSIS — E669 Obesity, unspecified: Secondary | ICD-10-CM | POA: Diagnosis present

## 2021-10-03 DIAGNOSIS — Z9181 History of falling: Secondary | ICD-10-CM | POA: Diagnosis not present

## 2021-10-03 DIAGNOSIS — Z90710 Acquired absence of both cervix and uterus: Secondary | ICD-10-CM | POA: Diagnosis not present

## 2021-10-03 DIAGNOSIS — F0390 Unspecified dementia without behavioral disturbance: Secondary | ICD-10-CM | POA: Diagnosis present

## 2021-10-03 DIAGNOSIS — I455 Other specified heart block: Secondary | ICD-10-CM | POA: Diagnosis present

## 2021-10-03 DIAGNOSIS — Z833 Family history of diabetes mellitus: Secondary | ICD-10-CM | POA: Diagnosis not present

## 2021-10-03 DIAGNOSIS — I351 Nonrheumatic aortic (valve) insufficiency: Secondary | ICD-10-CM | POA: Diagnosis present

## 2021-10-03 DIAGNOSIS — E860 Dehydration: Secondary | ICD-10-CM | POA: Diagnosis present

## 2021-10-03 DIAGNOSIS — R2689 Other abnormalities of gait and mobility: Secondary | ICD-10-CM

## 2021-10-03 DIAGNOSIS — G928 Other toxic encephalopathy: Secondary | ICD-10-CM | POA: Diagnosis present

## 2021-10-03 DIAGNOSIS — U071 COVID-19: Secondary | ICD-10-CM | POA: Diagnosis present

## 2021-10-03 DIAGNOSIS — E1142 Type 2 diabetes mellitus with diabetic polyneuropathy: Secondary | ICD-10-CM | POA: Diagnosis present

## 2021-10-03 DIAGNOSIS — Z8249 Family history of ischemic heart disease and other diseases of the circulatory system: Secondary | ICD-10-CM | POA: Diagnosis not present

## 2021-10-03 DIAGNOSIS — E785 Hyperlipidemia, unspecified: Secondary | ICD-10-CM | POA: Diagnosis present

## 2021-10-03 DIAGNOSIS — Z6832 Body mass index (BMI) 32.0-32.9, adult: Secondary | ICD-10-CM | POA: Diagnosis not present

## 2021-10-03 DIAGNOSIS — E1122 Type 2 diabetes mellitus with diabetic chronic kidney disease: Secondary | ICD-10-CM | POA: Diagnosis present

## 2021-10-03 DIAGNOSIS — I129 Hypertensive chronic kidney disease with stage 1 through stage 4 chronic kidney disease, or unspecified chronic kidney disease: Secondary | ICD-10-CM | POA: Diagnosis present

## 2021-10-03 DIAGNOSIS — R32 Unspecified urinary incontinence: Secondary | ICD-10-CM | POA: Diagnosis present

## 2021-10-03 DIAGNOSIS — T424X5A Adverse effect of benzodiazepines, initial encounter: Secondary | ICD-10-CM | POA: Diagnosis present

## 2021-10-03 DIAGNOSIS — N179 Acute kidney failure, unspecified: Secondary | ICD-10-CM | POA: Diagnosis present

## 2021-10-03 LAB — TSH: TSH: 3.513 u[IU]/mL (ref 0.350–4.500)

## 2021-10-03 LAB — CK: Total CK: 109 U/L (ref 38–234)

## 2021-10-03 LAB — CBC
HCT: 36.3 % (ref 36.0–46.0)
Hemoglobin: 11.7 g/dL — ABNORMAL LOW (ref 12.0–15.0)
MCH: 28.1 pg (ref 26.0–34.0)
MCHC: 32.2 g/dL (ref 30.0–36.0)
MCV: 87.3 fL (ref 80.0–100.0)
Platelets: 193 10*3/uL (ref 150–400)
RBC: 4.16 MIL/uL (ref 3.87–5.11)
RDW: 13.5 % (ref 11.5–15.5)
WBC: 8.6 10*3/uL (ref 4.0–10.5)
nRBC: 0 % (ref 0.0–0.2)

## 2021-10-03 LAB — BASIC METABOLIC PANEL
Anion gap: 8 (ref 5–15)
BUN: 24 mg/dL — ABNORMAL HIGH (ref 8–23)
CO2: 21 mmol/L — ABNORMAL LOW (ref 22–32)
Calcium: 8.6 mg/dL — ABNORMAL LOW (ref 8.9–10.3)
Chloride: 108 mmol/L (ref 98–111)
Creatinine, Ser: 1.18 mg/dL — ABNORMAL HIGH (ref 0.44–1.00)
GFR, Estimated: 45 mL/min — ABNORMAL LOW (ref >60)
Glucose, Bld: 143 mg/dL — ABNORMAL HIGH (ref 70–99)
Potassium: 4.6 mmol/L (ref 3.5–5.1)
Sodium: 137 mmol/L (ref 135–145)

## 2021-10-03 LAB — CBG MONITORING, ED
Glucose-Capillary: 149 mg/dL — ABNORMAL HIGH (ref 70–99)
Glucose-Capillary: 132 mg/dL — ABNORMAL HIGH (ref 70–99)
Glucose-Capillary: 117 mg/dL — ABNORMAL HIGH (ref 70–99)

## 2021-10-03 LAB — ECHOCARDIOGRAM COMPLETE
AR max vel: 2.52 cm2
AV Area VTI: 2.73 cm2
AV Area mean vel: 2.49 cm2
AV Mean grad: 6 mmHg
AV Peak grad: 11 mmHg
Ao pk vel: 1.66 m/s
Area-P 1/2: 2.99 cm2
Height: 69 in
MV VTI: 2.27 cm2
P 1/2 time: 343 msec
S' Lateral: 2.4 cm
Weight: 3520 oz

## 2021-10-03 LAB — RESP PANEL BY RT-PCR (FLU A&B, COVID) ARPGX2
Influenza A by PCR: NEGATIVE
Influenza B by PCR: NEGATIVE
SARS Coronavirus 2 by RT PCR: POSITIVE — AB

## 2021-10-03 LAB — RPR: RPR Ser Ql: NONREACTIVE

## 2021-10-03 LAB — GLUCOSE, CAPILLARY: Glucose-Capillary: 200 mg/dL — ABNORMAL HIGH (ref 70–99)

## 2021-10-03 LAB — HEMOGLOBIN A1C
Hgb A1c MFr Bld: 6.6 % — ABNORMAL HIGH (ref 4.8–5.6)
Mean Plasma Glucose: 142.72 mg/dL

## 2021-10-03 LAB — VITAMIN B12: Vitamin B-12: 154 pg/mL — ABNORMAL LOW (ref 180–914)

## 2021-10-03 LAB — PHOSPHORUS: Phosphorus: 3.2 mg/dL (ref 2.5–4.6)

## 2021-10-03 LAB — MAGNESIUM: Magnesium: 2.1 mg/dL (ref 1.7–2.4)

## 2021-10-03 LAB — AMMONIA: Ammonia: 33 umol/L (ref 9–35)

## 2021-10-03 MED ORDER — DIAZEPAM 5 MG PO TABS
5.0000 mg | ORAL_TABLET | Freq: Once | ORAL | Status: AC
  Administered 2021-10-03: 5 mg via ORAL
  Filled 2021-10-03: qty 1

## 2021-10-03 MED ORDER — INSULIN ASPART 100 UNIT/ML IJ SOLN
0.0000 [IU] | Freq: Every day | INTRAMUSCULAR | Status: DC
  Administered 2021-10-04: 3 [IU] via SUBCUTANEOUS
  Filled 2021-10-03: qty 0.05

## 2021-10-03 MED ORDER — INSULIN GLARGINE-YFGN 100 UNIT/ML ~~LOC~~ SOLN
15.0000 [IU] | Freq: Every day | SUBCUTANEOUS | Status: DC
  Administered 2021-10-03 – 2021-10-05 (×3): 15 [IU] via SUBCUTANEOUS
  Filled 2021-10-03 (×3): qty 0.15

## 2021-10-03 MED ORDER — PROPRANOLOL HCL 20 MG PO TABS
20.0000 mg | ORAL_TABLET | Freq: Two times a day (BID) | ORAL | Status: DC
  Administered 2021-10-03 – 2021-10-05 (×4): 20 mg via ORAL
  Filled 2021-10-03 (×4): qty 1

## 2021-10-03 MED ORDER — INSULIN ASPART 100 UNIT/ML IJ SOLN
0.0000 [IU] | Freq: Three times a day (TID) | INTRAMUSCULAR | Status: DC
  Administered 2021-10-04: 2 [IU] via SUBCUTANEOUS
  Administered 2021-10-04: 7 [IU] via SUBCUTANEOUS
  Administered 2021-10-04 – 2021-10-05 (×3): 2 [IU] via SUBCUTANEOUS
  Administered 2021-10-05: 5 [IU] via SUBCUTANEOUS
  Filled 2021-10-03: qty 0.09

## 2021-10-03 MED ORDER — VITAMIN B-12 1000 MCG PO TABS
1000.0000 ug | ORAL_TABLET | Freq: Every day | ORAL | Status: DC
  Administered 2021-10-04 – 2021-10-05 (×2): 1000 ug via ORAL
  Filled 2021-10-03 (×2): qty 1

## 2021-10-03 MED ORDER — CYANOCOBALAMIN 1000 MCG/ML IJ SOLN
1000.0000 ug | Freq: Once | INTRAMUSCULAR | Status: AC
  Administered 2021-10-03: 1000 ug via SUBCUTANEOUS
  Filled 2021-10-03: qty 1

## 2021-10-03 MED ORDER — HEPARIN SODIUM (PORCINE) 5000 UNIT/ML IJ SOLN
5000.0000 [IU] | Freq: Three times a day (TID) | INTRAMUSCULAR | Status: DC
  Administered 2021-10-03 – 2021-10-05 (×8): 5000 [IU] via SUBCUTANEOUS
  Filled 2021-10-03 (×8): qty 1

## 2021-10-03 MED ORDER — SODIUM CHLORIDE 0.9 % IV SOLN: INTRAVENOUS | Status: DC

## 2021-10-03 NOTE — Assessment & Plan Note (Addendum)
Acute kidney injury on CKD 3a.  Serum creatinine 1.39.   Likely from poor p.o. intake in the setting of COVID-19 infection.  Hold losartan.  Still poor p.o. intake.  Monitor.

## 2021-10-03 NOTE — H&P (Signed)
History and Physical    [name] WRU:[id number] DOB: 11/14/1932 DOA: 10/02/2021  PCP: Aretta Nip, MD   Patient coming from: Home   Chief Complaint: Falls, difficulty with balance   HPI: [name] is a pleasant 88 y.o. female with medical history significant for insulin-dependent diabetes mellitus, CKD 3, hypertension, chronic back pain, and tremor, now presenting to the emergency department with frequent falls.  Patient has been living with her granddaughter for the past several weeks, is unable to provide history at time of admission and various family members give a somewhat conflicting account of recent events.  Family agrees that the patient has had difficulty with balance and multiple falls in the past 2 days without appreciable head injury or loss of consciousness, but there is some disagreement whether she has had balance difficulty and falls going back 2 weeks or more.  At her baseline, patient ambulates with a walker but in the past 2 days has been unable to stand up and complain that she feels she is falling when she attempts to do so.  EMS was called by the patient's granddaughter after a recurrent fall.  ED Course: Upon arrival to the ED, patient is found to be afebrile, saturating mid 90s on room air, and severely hypertensive at 1 point.  EKG features sinus rhythm with first-degree AV nodal block and LVH.  Chest x-ray is negative for acute cardiopulmonary disease and radiographs of the left shoulder are negative for acute fracture or dislocation.  Head CT is negative for acute intracranial abnormality.  Chemistry panel notable for creatinine 1.39.  CBC was unremarkable.  COVID-19 PCR positive.  Patient was given Ativan in order to facilitate MRI brain but became acutely confused and agitated, not improving after a second dose of Ativan.    Review of Systems:  Unable to obtain ROS due to the patient's clinical condition.  Past Medical History:  Diagnosis Date    Anemia    as a teenager   Arthritis    Cancer ([place])    skin   Cholelithiasis    Chronic insomnia    Chronic low back pain    Complication of anesthesia    DDD (degenerative disc disease), lumbar    Diabetes mellitus    Iddm x 12 years   Diabetic peripheral neuropathy ([place])    Essential tremor 04/01/2015   GERD (gastroesophageal reflux disease)    Hx of acute renal failure 02/2014   admitted to [facility]   Hx: UTI (urinary tract infection) 02/2014   Hyperlipemia    Hypertension    Memory disorder 08/06/2017   Obesity    Peripheral edema    Pneumonia    as a child   PONV (postoperative nausea and vomiting)    pt states Zofran not that helpful in the past, Phenergan works best for her   Staph infection    [id number]   Umbilical hernia    Wrist fracture    left October 2014    Past Surgical History:  Procedure Laterality Date   ABDOMINAL HYSTERECTOMY     BACK SURGERY  2005   CARDIAC CATHETERIZATION  2000   Dr Einar Gip   CATARACT EXTRACTION Bilateral    w/ lens implants   COLONOSCOPY  >10 years   EYE SURGERY     HERNIA REPAIR     IR KYPHO LUMBAR INC FX REDUCE BONE BX UNI/BIL CANNULATION INC/IMAGING  05/18/2017   IR KYPHO THORACIC WITH BONE BIOPSY  07/01/2017  IR RADIOLOGIST EVAL & MGMT  06/14/2017   IR RADIOLOGIST EVAL & MGMT  09/09/2017   LUMBAR LAMINECTOMY/DECOMPRESSION MICRODISCECTOMY Left 03/16/2014   Procedure: LUMBAR LAMINECTOMY/DECOMPRESSION MICRODISCECTOMY 1 LEVEL  lumbar level three/four;  Surgeon: [name], MD;  Location: [facility];  Service: Neurosurgery;  Laterality: Left;   LUMBAR LAMINECTOMY/DECOMPRESSION MICRODISCECTOMY Right 02/06/2015   Procedure: Right L4-5 Microdiskectomy;  Surgeon: Leeroy Cha, MD;  Location: [facility];  Service: Neurosurgery;  Laterality: Right;  Right L4-5 Microdiskectomy   MASS EXCISION Right 06/15/2019   Procedure: EXCISIONAL BIOPSY RIGHT BUTTOCK;  Surgeon: Leighton Ruff, MD;  Location: [place];  Service:  General;  Laterality: Right;   NECK SURGERY     OOPHORECTOMY     TOTAL KNEE ARTHROPLASTY Right     Social History:   reports that she has never smoked. She has never used smokeless tobacco. She reports that she does not drink alcohol and does not use drugs.  Allergies  Allergen Reactions   Ativan [Lorazepam] Anxiety    Aggressive, irritability, anxiety, opposite effect of what ativan should have done   Ciprofloxacin Other (See Comments)    Acute kidney failure   Codeine Nausea And Vomiting   Dilaudid [Hydromorphone Hcl] Nausea And Vomiting and Other (See Comments)    Severe sweating   Hydromorphone    Mysoline [Primidone] Nausea Only   Oxycodone Nausea And Vomiting    Family History  Problem Relation Age of Onset   Cancer Brother        prostate   Heart disease Brother    Alzheimer's disease Brother    Diabetes Brother    Stroke Mother    Hypertension Mother    COPD Brother    Cancer Brother        Prostate cancer   Hypertension Sister        Congestive heart failure   Diabetes Sister    Heart attack Son      Prior to Admission medications   Medication Sig Start Date End Date Taking? Authorizing Provider  acetaminophen (TYLENOL) 325 MG tablet Take 2 tablets (650 mg total) by mouth every 6 (six) hours as needed for mild pain (or Fever >/= 101). 05/19/17   Hosie Poisson, MD  B-D ULTRAFINE III SHORT PEN 31G X 8 MM MISC USE TWICE A DAY AS DIRECTED 06/13/20   Renato Shin, MD  Dulaglutide (TRULICITY) 3 YQ/0.3KV SOPN Inject 3 mg as directed once a week. 01/23/21   Renato Shin, MD  DULoxetine (CYMBALTA) 20 MG capsule TAKE 1 CAPSULE BY MOUTH TWICE A DAY 02/14/19   Kathrynn Ducking, MD  furosemide (LASIX) 20 MG tablet Take 20 mg by mouth daily. 07/01/20   [provider]  glucose blood test strip Use as instructed 01/23/21   Renato Shin, MD  Insulin Lispro Prot & Lispro (HUMALOG MIX 75/25 KWIKPEN) (75-25) 100 UNIT/ML Kwikpen Inject 45 Units into the skin daily with  breakfast. 01/23/21   Renato Shin, MD  losartan (COZAAR) 50 MG tablet TAKE 1/2 TABLET BY MOUTH EVERY DAY 08/04/21   Cantwell, Celeste C, PA-C  MICROLET LANCETS MISC Use to monitor glucose levels BID 07/22/18   Renato Shin, MD  MYRBETRIQ 50 MG TB24 tablet Take 50 mg by mouth daily. 05/02/20   [provider]  Needles & Syringes MISC 1 Package by Does not apply route 2 (two) times daily with a meal. 12/07/19   Renato Shin, MD  oxybutynin (DITROPAN-XL) 10 MG 24 hr tablet Take 10  mg by mouth daily. 06/06/20   [provider]  propranolol (INDERAL) 10 MG tablet TAKE 1 TABLET BY MOUTH 4 TIMES DAILY. 08/04/21   Penni Bombard, MD    Physical Exam: Vitals:   10/02/21 2230 10/02/21 2300 10/03/21 0000 10/03/21 0205  BP: (!) 146/64 (!) 157/77 (!) 178/70 (!) 184/80  Pulse: 90 95 95 90  Resp: (!) 22 19 16 20   Temp:      TempSrc:      SpO2: 91% 91% 90% 93%  Weight:      Height:        Constitutional: NAD, calm  Eyes: PERTLA, lids and conjunctivae normal ENMT: Mucous membranes are moist. Posterior pharynx clear of any exudate or lesions.   Neck: supple, no masses  Respiratory: no wheezing, no crackles. No accessory muscle use.  Cardiovascular: S1 & S2 heard, regular rate and rhythm. No significant JVD. Abdomen: No distension, no tenderness, soft. Bowel sounds active.  Musculoskeletal: no clubbing / cyanosis. No joint deformity upper and lower extremities.   Skin: no significant rashes, lesions, ulcers. Warm, dry, well-perfused. Neurologic: No gross facial asymmetry. Moving all extremities. Restless, disoriented.     Labs and Imaging on Admission: I have personally reviewed following labs and imaging studies  CBC: Recent Labs  Lab 10/02/21 1402  WBC 9.1  NEUTROABS 6.7  HGB 12.1  HCT 36.9  MCV 87.4  PLT 408   Basic Metabolic Panel: Recent Labs  Lab 10/02/21 1402  NA 134*  K 4.9  CL 104  CO2 23  GLUCOSE 169*  BUN 29*  CREATININE 1.39*  CALCIUM 8.8*    GFR: Estimated Creatinine Clearance: 35.8 mL/min (A) (by C-G formula based on SCr of 1.39 mg/dL (H)). Liver Function Tests: Recent Labs  Lab 10/02/21 1402  AST 17  ALT 13  ALKPHOS 74  BILITOT 0.8  PROT 7.4  ALBUMIN 3.9   No results for input(s): LIPASE, AMYLASE in the last 168 hours. No results for input(s): AMMONIA in the last 168 hours. Coagulation Profile: No results for input(s): INR, PROTIME in the last 168 hours. Cardiac Enzymes: No results for input(s): CKTOTAL, CKMB, CKMBINDEX, TROPONINI in the last 168 hours. BNP (last 3 results) No results for input(s): PROBNP in the last 8760 hours. HbA1C: No results for input(s): HGBA1C in the last 72 hours. CBG: No results for input(s): GLUCAP in the last 168 hours. Lipid Profile: No results for input(s): CHOL, HDL, LDLCALC, TRIG, CHOLHDL, LDLDIRECT in the last 72 hours. Thyroid Function Tests: No results for input(s): TSH, T4TOTAL, FREET4, T3FREE, THYROIDAB in the last 72 hours. Anemia Panel: No results for input(s): VITAMINB12, FOLATE, FERRITIN, TIBC, IRON, RETICCTPCT in the last 72 hours. Urine analysis:    Component Value Date/Time   COLORURINE STRAW (A) 10/02/2021 1402   APPEARANCEUR CLEAR 10/02/2021 1402   LABSPEC 1.009 10/02/2021 1402   PHURINE 6.0 10/02/2021 1402   GLUCOSEU 50 (A) 10/02/2021 1402   HGBUR NEGATIVE 10/02/2021 1402   BILIRUBINUR NEGATIVE 10/02/2021 1402   KETONESUR NEGATIVE 10/02/2021 1402   PROTEINUR NEGATIVE 10/02/2021 1402   UROBILINOGEN 0.2 05/03/2014 0942   NITRITE NEGATIVE 10/02/2021 1402   LEUKOCYTESUR NEGATIVE 10/02/2021 1402   Sepsis Labs: @LABRCNTIP (procalcitonin:4,lacticidven:4) ) Recent Results (from the past 240 hour(s))  Resp Panel by RT-PCR (Flu A&B, Covid) Nasopharyngeal Swab     Status: Abnormal   Collection Time: 10/02/21 10:45 PM   Specimen: Nasopharyngeal Swab; Nasopharyngeal(NP) swabs in vial transport medium  Result Value Ref Range Status   SARS  Coronavirus 2 by RT PCR  POSITIVE (A) NEGATIVE Final    Comment: (NOTE) SARS-CoV-2 target nucleic acids are DETECTED.  The SARS-CoV-2 RNA is generally detectable in upper respiratory specimens during the acute phase of infection. Positive results are indicative of the presence of the identified virus, but do not rule out bacterial infection or co-infection with other pathogens not detected by the test. Clinical correlation with patient history and other diagnostic information is necessary to determine patient infection status. The expected result is Negative.  Fact Sheet for Patients: EntrepreneurPulse.com.au  Fact Sheet for Healthcare Providers: IncredibleEmployment.be  This test is not yet approved or cleared by the [place] FDA and  has been authorized for detection and/or diagnosis of SARS-CoV-2 by FDA under an Emergency Use Authorization (EUA).  This EUA will remain in effect (meaning this test can be used) for the duration of  the COVID-19 declaration under Section 564(b)(1) of the A ct, 21 U.S.C. section 360bbb-3(b)(1), unless the authorization is terminated or revoked sooner.     Influenza A by PCR NEGATIVE NEGATIVE Final   Influenza B by PCR NEGATIVE NEGATIVE Final    Comment: (NOTE) The Xpert Xpress SARS-CoV-2/FLU/RSV plus assay is intended as an aid in the diagnosis of influenza from Nasopharyngeal swab specimens and should not be used as a sole basis for treatment. Nasal washings and aspirates are unacceptable for Xpert Xpress SARS-CoV-2/FLU/RSV testing.  Fact Sheet for Patients: EntrepreneurPulse.com.au  Fact Sheet for Healthcare Providers: IncredibleEmployment.be  This test is not yet approved or cleared by the [place] FDA and has been authorized for detection and/or diagnosis of SARS-CoV-2 by FDA under an Emergency Use Authorization (EUA). This EUA will remain in effect (meaning this test can be used)  for the duration of the COVID-19 declaration under Section 564(b)(1) of the Act, 21 U.S.C. section 360bbb-3(b)(1), unless the authorization is terminated or revoked.  Performed at [facility], [place] [address], [place], [place] [address]      Radiological Exams on Admission: CT Head Wo Contrast  Result Date: 10/02/2021 CLINICAL DATA:  Trauma, generalized weakness EXAM: CT HEAD WITHOUT CONTRAST TECHNIQUE: Contiguous axial images were obtained from the base of the skull through the vertex without intravenous contrast. RADIATION DOSE REDUCTION: This exam was performed according to the departmental dose-optimization program which includes automated exposure control, adjustment of the mA and/or kV according to patient size and/or use of iterative reconstruction technique. COMPARISON:  12/23/2016 FINDINGS: Brain: No acute intracranial findings are seen. Cortical sulci are prominent. There is no shift of midline structures. There is decreased density in the periventricular white matter. There are small calcifications in the basal ganglia on both sides. Vascular: Unremarkable. Skull: No fracture is seen in the calvarium. Sinuses/Orbits: Unremarkable. Other: No significant interval changes are noted. IMPRESSION: No acute intracranial findings are seen in noncontrast CT brain. Atrophy. Small-vessel disease. Electronically Signed   By: Elmer Picker M.D.   On: 10/02/2021 18:11   DG Chest Port 1 View  Result Date: 10/02/2021 CLINICAL DATA:  Generalized weakness, COVID positive EXAM: PORTABLE CHEST 1 VIEW COMPARISON:  05/14/2017 FINDINGS: The heart size and mediastinal contours are within normal limits. Both lungs are clear. There is previous surgical fusion in the lower cervical spine. IMPRESSION: No active disease. Electronically Signed   By: Elmer Picker M.D.   On: 10/02/2021 17:12   DG Shoulder Left  Result Date: 10/02/2021 CLINICAL DATA:  Left shoulder pain after hitting  her shoulder on a counter in a fall.  EXAM: LEFT SHOULDER - 2+ VIEW COMPARISON:  Portable chest obtained earlier today. FINDINGS: No fracture or dislocation. Mild acromioclavicular spur formation, mild greater tuberosity hyperostosis and minimal inferior glenohumeral spur formation. IMPRESSION: 1. No fracture or dislocation. 2. Mild degenerative changes. Electronically Signed   By: Claudie Revering M.D.   On: 10/02/2021 17:58   DG Hips Bilat W or Wo Pelvis 3-4 Views  Result Date: 10/02/2021 CLINICAL DATA:  Bilateral hip pain, greater on the left, following multiple falls over several days. EXAM: DG HIP (WITH OR WITHOUT PELVIS) 3-4V BILAT COMPARISON:  Abdomen and pelvis CT obtained without intravenous contrast on 07/04/2019 FINDINGS: Stable appearance of the hips with no fracture or dislocation seen. Lower lumbar spine degenerative changes are noted. IMPRESSION: No fracture or dislocation. Electronically Signed   By: Claudie Revering M.D.   On: 10/02/2021 17:57    EKG: Independently reviewed. Sinus rhythm, 1st degree AV block, LVH.   Assessment/Plan   1. Balance difficulty, frequent falls  - Presents with with difficulty balancing and frequent falls; pt became confused and agitated in ED and unable to provide hx  - No acute findings on head CT; MRI was ordered from ED but could not be obtained d/t pt agitation after Ativan  - She has documented hx of vertigo; no definite focal deficit noted on exam  - Clarify hx with patient if/when mental status improves, consider another attempt at MRI    2. Acute encephalopathy  - Pt became markedly confused and agitated in ED - She was reported to be oriented and cooperative prior to receiving Ativan for MRI and this may be a paradoxical reaction to Ativan  - Check TSH, ammonia, B12, and RPR; institute delirium precautions   3. Type II DM  - A1c was 7.9% in May 2022  - Continue CBG checks and insulin    4. COVID-19  - COVID pcr positive in ED  - Per ED  personnel, family had reported pt was positive 2-4 wks ago but there appears to be a negative result from 09/26/21 in PCP notes  - Continue isolation for now pending clarification from family    5. CKD III  - SCr is 1.39 on admission, up from 0.9 in 2020; unclear if increase is acute or progression from CKD 3a to 3b  - Renally-dose medications, monitor   6. Tremor; HTN  - Continue propanolol    DVT prophylaxis: sq heparin  Code Status: Full, discussed with pt's son in ED  Level of Care: Level of care: Med-Surg Family Communication: Son at bedside  Disposition Plan:  Patient is from: home  Anticipated d/c is to: Home  Anticipated d/c date is: 1/27 or 10/04/21 Patient currently: Pending MRI brain, improvement in mental status  Consults called: none  Admission status: Observation     Vianne Bulls, MD [facility]  10/03/2021, 3:42 AM

## 2021-10-03 NOTE — Progress Notes (Signed)
°  Progress Note   Patient: [name] TLX:[id number] DOB: [birth year] DOA: 10/02/2021     0 DOS: the patient was seen and examined on 10/03/2021   Brief hospital course: No notes on file  Assessment and Plan Ataxia Balance difficulty. Frequent fall. Essential tremor. B12 deficiency. Patient has multiple history of falls in the past.  Presents with complaints of urinary incontinence and a fall.  Had dehydration and COVID-19 infection present on admission. CT head was unremarkable.  Patient continues to have symptoms of dizziness and therefore MRI brain was pursued.  Become confused after receiving benzodiazepine for the procedure. Currently initiate further work-up including metabolic work-up.  B12 level severely low.  Will replace. PT OT consult.  Acute encephalopathy- (present on admission) Likely toxic secondary to benzodiazepine. Monitor.  COVID-19 virus infection- (present on admission) 2 weeks ago COVID-positive.  Currently on room air. Per family has taken some therapy for COVID-19 infection.  Monitor.  HTN (hypertension)- (present on admission) Patient is on propranolol and losartan. Currently continuing propranolol.  Chronic kidney disease, stage 3a ([place])- (present on admission) Acute kidney injury on CKD 3a.  Serum creatinine 1.39.  Improving.  Likely from poor p.o. intake in the setting of COVID-19 infection.  Hold losartan.  Monitor.  Continue IV fluid.      Subjective: Mentation improving.  No nausea no vomiting no fever no chills.  Continues to have poor p.o. intake.  Objective Vitals:   10/03/21 0505 10/03/21 0809 10/03/21 1153 10/03/21 1700  BP: (!) 175/83 (!) 182/80 (!) 193/79 (!) 193/97  Pulse: 88 84 82 73  Resp: 15 (!) 25 17 18   Temp:      TempSrc:      SpO2: 93% 92% 96% 96%  Weight:      Height:        General: Appear in mild distress, no Rash; Oral Mucosa Clear, moist. no Abnormal Neck Mass Or lumps, Conjunctiva normal  Cardiovascular: S1 and  S2 Present, no Murmur, Respiratory: good respiratory effort, Bilateral Air entry present and CTA, no Crackles, no wheezes Abdomen: Bowel Sound present, Soft and no tenderness Extremities: no Pedal edema Neurology: alert and oriented to time, place, and person affect appropriate. no new focal deficit Gait not checked due to patient safety concerns   Data Reviewed:  My review of labs, imaging, notes and other tests shows no new significant findings.   Family Communication: Son at bedside.  Disposition: Status is: Inpatient  Remains inpatient appropriate because: Ongoing confusion, COVID-19 infection, poor p.o. intake requiring IV fluid. Author: Berle Mull, MD 10/03/2021 7:25 PM  For on call review [email or web address].

## 2021-10-03 NOTE — ED Notes (Signed)
Patient continues to be agitated, fighting the mittens. Patient not easily redirectable.

## 2021-10-03 NOTE — ED Notes (Signed)
Mittens placed on patient. Patient continuing to rip off all EKG leads, IV, pulse ox.

## 2021-10-03 NOTE — Assessment & Plan Note (Addendum)
2 weeks ago COVID-positive.  Currently on room air. Per family patient was treated with Paxlovid.  CT value 36.8. Will discontinue isolation.

## 2021-10-03 NOTE — ED Notes (Signed)
Pt sleeping at this time.

## 2021-10-03 NOTE — ED Notes (Signed)
ED TO INPATIENT HANDOFF REPORT  Name/Age/Gender [name] 88 y.o. female  Code Status    Code Status Orders  (From admission, onward)           Start     Ordered   10/03/21 0340  Full code  Continuous        10/03/21 0341           Code Status History     Date Active Date Inactive Code Status Order ID Comments User Context   05/17/2017 1339 05/19/2017 2215 Full Code [id number]  Samella Parr, NP Inpatient   05/15/2017 0014 05/16/2017 2022 Full Code [id number]  Gennaro Africa, MD ED   01/04/2017 1405 01/06/2017 1705 Full Code [id number]  Waldemar Dickens, MD Inpatient   12/24/2016 0223 12/26/2016 0001 Partial Code [id number]  Ivor Costa, MD ED   05/15/2016 2239 05/16/2016 1935 DNR [id number]  Karmen Bongo, MD Inpatient   02/06/2015 1354 02/08/2015 1602 Full Code [id number]  Leeroy Cha, MD Inpatient   03/20/2014 2149 03/23/2014 1925 Full Code [id number]  [name], MD Inpatient   03/18/2014 0318 03/19/2014 1926 Full Code [id number]  Etta Quill, DO ED   03/16/2014 1501 03/17/2014 1351 Full Code [id number]  [name], MD Inpatient   03/06/2014 1122 03/07/2014 0335 Full Code [id number]  San Morelle, MD HOV   02/12/2014 0031 02/13/2014 1500 Full Code [id number]  Rise Patience, MD Inpatient      Advance Directive Documentation    Flowsheet Row Most Recent Value  Type of Advance Directive Living will, Healthcare Power of Attorney  Pre-existing out of facility DNR order (yellow form or pink MOST form) --  "MOST" Form in Place? --       Home/SNF/Other Home  Chief Complaint Acute encephalopathy [G93.40] Ataxia [R27.0]  Level of Care/Admitting Diagnosis ED Disposition     ED Disposition  Admit   Condition  --   [place]: [facility] [[id number]]  Level of Care: Med-Surg [16]  May admit patient to [facility] or [facility] if equivalent level of care is available:: Yes  Covid Evaluation: Confirmed COVID Positive   Diagnosis: Ataxia [212248]  Admitting Physician: Lavina Hamman [[id number]]  Attending Physician: Lavina Hamman [[id number]]  Estimated length of stay: past midnight tomorrow  Certification:: I certify this patient will need inpatient services for at least 2 midnights          Medical History Past Medical History:  Diagnosis Date   Anemia    as a teenager   Arthritis    Cancer ([place])    skin   Cholelithiasis    Chronic insomnia    Chronic low back pain    Complication of anesthesia    DDD (degenerative disc disease), lumbar    Diabetes mellitus    Iddm x 12 years   Diabetic peripheral neuropathy ([place])    Essential tremor 04/01/2015   GERD (gastroesophageal reflux disease)    Hx of acute renal failure 02/2014   admitted to [facility]   Hx: UTI (urinary tract infection) 02/2014   Hyperlipemia    Hypertension    Memory disorder 08/06/2017   Obesity    Peripheral edema    Pneumonia    as a child   PONV (postoperative nausea and vomiting)    pt states Zofran not that helpful in the past, Phenergan works best for her   Staph infection    9450'T   Umbilical hernia  Wrist fracture    left October 2014    Allergies Allergies  Allergen Reactions   Ativan [Lorazepam] Anxiety    Aggressive, irritability, anxiety, opposite effect of what ativan should have done   Ciprofloxacin Other (See Comments)    Acute kidney failure (loss of balance reported by [facility] physicians)   Codeine Nausea And Vomiting   Dilaudid [Hydromorphone Hcl] Nausea And Vomiting and Other (See Comments)    [name] sweating   Mysoline [Primidone] Nausea Only   Oxycodone Nausea And Vomiting    IV Location/Drains/Wounds Patient Lines/Drains/Airways Status     Active Line/Drains/Airways     Name Placement date Placement time Site Days   Peripheral IV 10/02/21 20 G Anterior;Right Forearm 10/02/21  1928  Forearm  1   Incision (Closed) 02/06/15 Back Bilateral 02/06/15  1035  -- 2431   Incision (Closed)  05/18/17 Back Right;Lower 05/18/17  1551  -- 1599   Incision (Closed) 06/15/19 Buttocks Right 06/15/19  0939  -- 841            Labs/Imaging Results for orders placed or performed during the hospital encounter of 10/02/21 (from the past 48 hour(s))  CBC with Differential     Status: None   Collection Time: 10/02/21  2:02 PM  Result Value Ref Range   WBC 9.1 4.0 - 10.5 K/uL   RBC 4.22 3.87 - 5.11 MIL/uL   Hemoglobin 12.1 12.0 - 15.0 g/dL   HCT 36.9 36.0 - 46.0 %   MCV 87.4 80.0 - 100.0 fL   MCH 28.7 26.0 - 34.0 pg   MCHC 32.8 30.0 - 36.0 g/dL   RDW 13.6 11.5 - 15.5 %   Platelets 219 150 - 400 K/uL   nRBC 0.0 0.0 - 0.2 %   Neutrophils Relative % 75 %   Neutro Abs 6.7 1.7 - 7.7 K/uL   Lymphocytes Relative 17 %   Lymphs Abs 1.6 0.7 - 4.0 K/uL   Monocytes Relative 7 %   Monocytes Absolute 0.7 0.1 - 1.0 K/uL   Eosinophils Relative 1 %   Eosinophils Absolute 0.1 0.0 - 0.5 K/uL   Basophils Relative 0 %   Basophils Absolute 0.0 0.0 - 0.1 K/uL   Immature Granulocytes 0 %   Abs Immature Granulocytes 0.02 0.00 - 0.07 K/uL    Comment: Performed at [facility] [place] [address] [place], [place] [address]  Comprehensive metabolic panel     Status: Abnormal   Collection Time: 10/02/21  2:02 PM  Result Value Ref Range   Sodium 134 (L) 135 - 145 mmol/L   Potassium 4.9 3.5 - 5.1 mmol/L   Chloride 104 98 - 111 mmol/L   CO2 23 22 - 32 mmol/L   Glucose, Bld 169 (H) 70 - 99 mg/dL    Comment: Glucose reference range applies only to samples taken after fasting for at least 8 hours.   BUN 29 (H) 8 - 23 mg/dL   Creatinine, Ser 1.39 (H) 0.44 - 1.00 mg/dL   Calcium 8.8 (L) 8.9 - 10.3 mg/dL   Total Protein 7.4 6.5 - 8.1 g/dL   Albumin 3.9 3.5 - 5.0 g/dL   AST 17 15 - 41 U/L   ALT 13 0 - 44 U/L   Alkaline Phosphatase 74 38 - 126 U/L   Total Bilirubin 0.8 0.3 - 1.2 mg/dL   GFR, Estimated 37 (L) >60 mL/min    Comment: (NOTE) Calculated using the CKD-EPI Creatinine Equation  (2021)    Anion  gap 7 5 - 15    Comment: Performed at [facility], [place] [address], [place], [place] [address]  Urinalysis, Routine w reflex microscopic     Status: Abnormal   Collection Time: 10/02/21  2:02 PM  Result Value Ref Range   Color, Urine STRAW (A) YELLOW   APPearance CLEAR CLEAR   Specific Gravity, Urine 1.009 1.005 - 1.030   pH 6.0 5.0 - 8.0   Glucose, UA 50 (A) NEGATIVE mg/dL   Hgb urine dipstick NEGATIVE NEGATIVE   Bilirubin Urine NEGATIVE NEGATIVE   Ketones, ur NEGATIVE NEGATIVE mg/dL   Protein, ur NEGATIVE NEGATIVE mg/dL   Nitrite NEGATIVE NEGATIVE   Leukocytes,Ua NEGATIVE NEGATIVE    Comment: Performed at [place] [address], [place], [place] [address]  Resp Panel by RT-PCR (Flu A&B, Covid) Nasopharyngeal Swab     Status: Abnormal   Collection Time: 10/02/21 10:45 PM   Specimen: Nasopharyngeal Swab; Nasopharyngeal(NP) swabs in vial transport medium  Result Value Ref Range   SARS Coronavirus 2 by RT PCR POSITIVE (A) NEGATIVE    Comment: (NOTE) SARS-CoV-2 target nucleic acids are DETECTED.  The SARS-CoV-2 RNA is generally detectable in upper respiratory specimens during the acute phase of infection. Positive results are indicative of the presence of the identified virus, but do not rule out bacterial infection or co-infection with other pathogens not detected by the test. Clinical correlation with patient history and other diagnostic information is necessary to determine patient infection status. The expected result is Negative.  Fact Sheet for Patients: EntrepreneurPulse.com.au  Fact Sheet for Healthcare Providers: IncredibleEmployment.be  This test is not yet approved or cleared by the [place] FDA and  has been authorized for detection and/or diagnosis of SARS-CoV-2 by FDA under an Emergency Use Authorization (EUA).  This EUA will remain in effect (meaning this test  can be used) for the duration of  the COVID-19 declaration under Section 564(b)(1) of the A ct, 21 U.S.C. section 360bbb-3(b)(1), unless the authorization is terminated or revoked sooner.     Influenza A by PCR NEGATIVE NEGATIVE   Influenza B by PCR NEGATIVE NEGATIVE    Comment: (NOTE) The Xpert Xpress SARS-CoV-2/FLU/RSV plus assay is intended as an aid in the diagnosis of influenza from Nasopharyngeal swab specimens and should not be used as a sole basis for treatment. Nasal washings and aspirates are unacceptable for Xpert Xpress SARS-CoV-2/FLU/RSV testing.  Fact Sheet for Patients: EntrepreneurPulse.com.au  Fact Sheet for Healthcare Providers: IncredibleEmployment.be  This test is not yet approved or cleared by the [place] FDA and has been authorized for detection and/or diagnosis of SARS-CoV-2 by FDA under an Emergency Use Authorization (EUA). This EUA will remain in effect (meaning this test can be used) for the duration of the COVID-19 declaration under Section 564(b)(1) of the Act, 21 U.S.C. section 360bbb-3(b)(1), unless the authorization is terminated or revoked.  Performed at [facility], [place] [address], [place], [place] [address]   Basic metabolic panel     Status: Abnormal   Collection Time: 10/03/21  3:55 AM  Result Value Ref Range   Sodium 137 135 - 145 mmol/L   Potassium 4.6 3.5 - 5.1 mmol/L   Chloride 108 98 - 111 mmol/L   CO2 21 (L) 22 - 32 mmol/L   Glucose, Bld 143 (H) 70 - 99 mg/dL    Comment: Glucose reference range applies only to samples taken after fasting for at least 8 hours.   BUN  24 (H) 8 - 23 mg/dL   Creatinine, Ser 1.18 (H) 0.44 - 1.00 mg/dL   Calcium 8.6 (L) 8.9 - 10.3 mg/dL   GFR, Estimated 45 (L) >60 mL/min    Comment: (NOTE) Calculated using the CKD-EPI Creatinine Equation (2021)    Anion gap 8 5 - 15    Comment: Performed at [facility], [place] [address] [place], [place] [address]  Magnesium     Status: None   Collection Time: 10/03/21  3:55 AM  Result Value Ref Range   Magnesium 2.1 1.7 - 2.4 mg/dL    Comment: Performed at [facility], [place] [address] [place], [place] [address]  Phosphorus     Status: None   Collection Time: 10/03/21  3:55 AM  Result Value Ref Range   Phosphorus 3.2 2.5 - 4.6 mg/dL    Comment: Performed at [facility], [place] [address], [place], [place] [address]  CBC     Status: Abnormal   Collection Time: 10/03/21  3:55 AM  Result Value Ref Range   WBC 8.6 4.0 - 10.5 K/uL   RBC 4.16 3.87 - 5.11 MIL/uL   Hemoglobin 11.7 (L) 12.0 - 15.0 g/dL   HCT 36.3 36.0 - 46.0 %   MCV 87.3 80.0 - 100.0 fL   MCH 28.1 26.0 - 34.0 pg   MCHC 32.2 30.0 - 36.0 g/dL   RDW 13.5 11.5 - 15.5 %   Platelets 193 150 - 400 K/uL   nRBC 0.0 0.0 - 0.2 %    Comment: Performed at [facility], [place] [address], [place], [place] [address]  TSH     Status: None   Collection Time: 10/03/21  3:55 AM  Result Value Ref Range   TSH 3.513 0.350 - 4.500 uIU/mL    Comment: Performed by a 3rd Generation assay with a functional sensitivity of <=0.01 uIU/mL. Performed at [facility], [place] [address] [place], [place] [address]   Ammonia     Status: None   Collection Time: 10/03/21  3:55 AM  Result Value Ref Range   Ammonia 33 9 - 35 umol/L    Comment: Performed at [facility], [place] [address], [place], [place] [address]  Vitamin B12     Status: Abnormal   Collection Time: 10/03/21  3:55 AM  Result Value Ref Range   Vitamin B-12 154 (L) 180 - 914 pg/mL    Comment: (NOTE) This assay is not validated for testing neonatal or myeloproliferative syndrome specimens for Vitamin B12 levels. Performed at [facility], [place] [address] [place], [place] [address]   RPR     Status: None   Collection Time: 10/03/21  3:55 AM  Result Value Ref Range    RPR Ser Ql NON REACTIVE NON REACTIVE    Comment: Performed at [facility], [place] [address], [place], [place] [address]  CK     Status: None   Collection Time: 10/03/21  3:55 AM  Result Value Ref Range   Total CK 109 38 - 234 U/L    Comment: Performed at [facility], [place] [address], [place],  [address]  Hemoglobin A1c     Status: Abnormal   Collection Time: 10/03/21  3:55 AM  Result Value Ref Range   Hgb A1c MFr Bld 6.6 (H) 4.8 - 5.6 %    Comment: (NOTE) Pre diabetes:  5.7%-6.4%  Diabetes:              >6.4%  Glycemic control for   <7.0% adults with diabetes    Mean Plasma Glucose 142.72 mg/dL    Comment: Performed at [place] [address], [place], [place] [address]  CBG monitoring, ED     Status: Abnormal   Collection Time: 10/03/21  7:38 AM  Result Value Ref Range   Glucose-Capillary 149 (H) 70 - 99 mg/dL    Comment: Glucose reference range applies only to samples taken after fasting for at least 8 hours.  CBG monitoring, ED     Status: Abnormal   Collection Time: 10/03/21 12:05 PM  Result Value Ref Range   Glucose-Capillary 132 (H) 70 - 99 mg/dL    Comment: Glucose reference range applies only to samples taken after fasting for at least 8 hours.  CBG monitoring, ED     Status: Abnormal   Collection Time: 10/03/21  5:52 PM  Result Value Ref Range   Glucose-Capillary 117 (H) 70 - 99 mg/dL    Comment: Glucose reference range applies only to samples taken after fasting for at least 8 hours.   CT Head Wo Contrast  Result Date: 10/02/2021 CLINICAL DATA:  Trauma, generalized weakness EXAM: CT HEAD WITHOUT CONTRAST TECHNIQUE: Contiguous axial images were obtained from the base of the skull through the vertex without intravenous contrast. RADIATION DOSE REDUCTION: This exam was performed according to the departmental dose-optimization program which includes automated exposure control, adjustment of the mA and/or kV according to  patient size and/or use of iterative reconstruction technique. COMPARISON:  12/23/2016 FINDINGS: Brain: No acute intracranial findings are seen. Cortical sulci are prominent. There is no shift of midline structures. There is decreased density in the periventricular white matter. There are small calcifications in the basal ganglia on both sides. Vascular: Unremarkable. Skull: No fracture is seen in the calvarium. Sinuses/Orbits: Unremarkable. Other: No significant interval changes are noted. IMPRESSION: No acute intracranial findings are seen in noncontrast CT brain. Atrophy. Small-vessel disease. Electronically Signed   By: Elmer Picker M.D.   On: 10/02/2021 18:11   DG Chest Port 1 View  Result Date: 10/02/2021 CLINICAL DATA:  Generalized weakness, COVID positive EXAM: PORTABLE CHEST 1 VIEW COMPARISON:  05/14/2017 FINDINGS: The heart size and mediastinal contours are within normal limits. Both lungs are clear. There is previous surgical fusion in the lower cervical spine. IMPRESSION: No active disease. Electronically Signed   By: Elmer Picker M.D.   On: 10/02/2021 17:12   DG Shoulder Left  Result Date: 10/02/2021 CLINICAL DATA:  Left shoulder pain after hitting her shoulder on a counter in a fall. EXAM: LEFT SHOULDER - 2+ VIEW COMPARISON:  Portable chest obtained earlier today. FINDINGS: No fracture or dislocation. Mild acromioclavicular spur formation, mild greater tuberosity hyperostosis and minimal inferior glenohumeral spur formation. IMPRESSION: 1. No fracture or dislocation. 2. Mild degenerative changes. Electronically Signed   By: Claudie Revering M.D.   On: 10/02/2021 17:58   ECHOCARDIOGRAM COMPLETE  Result Date: 10/03/2021    ECHOCARDIOGRAM REPORT   Patient Name:   [name] Date of Exam: 10/03/2021 Medical Rec #:  [id number]      Height:       69.0 in Accession #:    [id number]     Weight:       220.0 lb Date of Birth:  22-Jan-1933       BSA:  2.151 m Patient Age:    54 years        BP:           193/79 mmHg Patient Gender: F              HR:           82 bpm. Exam Location:  Inpatient Procedure: 2D Echo Indications:    Murmur  History:        Patient has prior history of Echocardiogram examinations, most                 recent 07/25/2020. Risk Factors:Diabetes, Hypertension and                 Dyslipidemia.  Sonographer:    Arlyss Gandy Referring Phys: [id number] [place]  Sonographer Comments: Supine. IMPRESSIONS  1. Left ventricular ejection fraction, by estimation, is 65 to 70%. The left ventricle has normal function. The left ventricle has no regional wall motion abnormalities. Left ventricular diastolic parameters are consistent with Grade I diastolic dysfunction (impaired relaxation). Elevated left ventricular end-diastolic pressure.  2. Right ventricular systolic function is normal. The right ventricular size is normal. There is moderately elevated pulmonary artery systolic pressure. The estimated right ventricular systolic pressure is 16.3 mmHg.  3. The mitral valve is normal in structure. Mild mitral valve regurgitation. No evidence of mitral stenosis.  4. The aortic valve is normal in structure. Aortic valve regurgitation is moderate. No aortic stenosis is present. Aortic regurgitation PHT measures 343 msec. Aortic valve area, by VTI measures 2.73 cm. Aortic valve mean gradient measures 6.0 mmHg. Aortic valve Vmax measures 1.66 m/s.  5. The inferior vena cava is normal in size with <50% respiratory variability, suggesting right atrial pressure of 8 mmHg. FINDINGS  Left Ventricle: Left ventricular ejection fraction, by estimation, is 65 to 70%. The left ventricle has normal function. The left ventricle has no regional wall motion abnormalities. The left ventricular internal cavity size was normal in size. There is  no left ventricular hypertrophy. Left ventricular diastolic parameters are consistent with Grade I diastolic dysfunction (impaired relaxation). Elevated left  ventricular end-diastolic pressure. Right Ventricle: The right ventricular size is normal. No increase in right ventricular wall thickness. Right ventricular systolic function is normal. There is moderately elevated pulmonary artery systolic pressure. The tricuspid regurgitant velocity is 3.18 m/s, and with an assumed right atrial pressure of 8 mmHg, the estimated right ventricular systolic pressure is 84.6 mmHg. Left Atrium: Left atrial size was normal in size. Right Atrium: Right atrial size was normal in size. Pericardium: There is no evidence of pericardial effusion. Mitral Valve: The mitral valve is normal in structure. Mild mitral valve regurgitation. No evidence of mitral valve stenosis. MV peak gradient, 16.3 mmHg. The mean mitral valve gradient is 7.0 mmHg. Tricuspid Valve: The tricuspid valve is normal in structure. Tricuspid valve regurgitation is mild . No evidence of tricuspid stenosis. Aortic Valve: The aortic valve is normal in structure. Aortic valve regurgitation is moderate. Aortic regurgitation PHT measures 343 msec. No aortic stenosis is present. Aortic valve mean gradient measures 6.0 mmHg. Aortic valve peak gradient measures 11.0 mmHg. Aortic valve area, by VTI measures 2.73 cm. Pulmonic Valve: The pulmonic valve was normal in structure. Pulmonic valve regurgitation is not visualized. No evidence of pulmonic stenosis. Aorta: The aortic root is normal in size and structure. Venous: The inferior vena cava is normal in size with less than 50% respiratory variability, suggesting right atrial pressure of  8 mmHg. IAS/Shunts: No atrial level shunt detected by color flow Doppler.  LEFT VENTRICLE PLAX 2D LVIDd:         3.70 cm   Diastology LVIDs:         2.40 cm   LV e' medial:    5.66 cm/s LV PW:         1.10 cm   LV E/e' medial:  21.7 LV IVS:        1.10 cm   LV e' lateral:   8.59 cm/s LVOT diam:     2.00 cm   LV E/e' lateral: 14.3 LV SV:         101 LV SV Index:   47 LVOT Area:     3.14 cm  RIGHT  VENTRICLE            IVC RV Basal diam:  2.50 cm    IVC diam: 2.00 cm RV Mid diam:    2.30 cm RV S prime:     9.90 cm/s TAPSE (M-mode): 1.7 cm LEFT ATRIUM             Index        RIGHT ATRIUM           Index LA diam:        4.40 cm 2.05 cm/m   RA Area:     16.10 cm LA Vol (A2C):   46.7 ml 21.71 ml/m  RA Volume:   43.10 ml  20.03 ml/m LA Vol (A4C):   47.8 ml 22.22 ml/m LA Biplane Vol: 48.5 ml 22.54 ml/m  AORTIC VALVE AV Area (Vmax):    2.52 cm AV Area (Vmean):   2.49 cm AV Area (VTI):     2.73 cm AV Vmax:           166.00 cm/s AV Vmean:          118.000 cm/s AV VTI:            0.370 m AV Peak Grad:      11.0 mmHg AV Mean Grad:      6.0 mmHg LVOT Vmax:         133.00 cm/s LVOT Vmean:        93.600 cm/s LVOT VTI:          0.321 m LVOT/AV VTI ratio: 0.87 AI PHT:            343 msec  AORTA Ao Root diam: 3.10 cm Ao Asc diam:  3.10 cm MITRAL VALVE                TRICUSPID VALVE MV Area (PHT): 2.99 cm     TR Peak grad:   40.4 mmHg MV Area VTI:   2.27 cm     TR Vmax:        318.00 cm/s MV Peak grad:  16.3 mmHg MV Mean grad:  7.0 mmHg     SHUNTS MV Vmax:       2.02 m/s     Systemic VTI:  0.32 m MV Vmean:      118.5 cm/s   Systemic Diam: 2.00 cm MV Decel Time: 254 msec MV E velocity: 123.00 cm/s MV A velocity: 198.00 cm/s MV E/A ratio:  0.62 Fransico Him MD Electronically signed by Fransico Him MD Signature Date/Time: 10/03/2021/4:20:25 PM    Final    DG Hips Bilat W or Wo Pelvis 3-4 Views  Result Date: 10/02/2021 CLINICAL DATA:  Bilateral hip pain, greater on the  left, following multiple falls over several days. EXAM: DG HIP (WITH OR WITHOUT PELVIS) 3-4V BILAT COMPARISON:  Abdomen and pelvis CT obtained without intravenous contrast on 07/04/2019 FINDINGS: Stable appearance of the hips with no fracture or dislocation seen. Lower lumbar spine degenerative changes are noted. IMPRESSION: No fracture or dislocation. Electronically Signed   By: Claudie Revering M.D.   On: 10/02/2021 17:57    Pending Labs Unresulted  Labs (From admission, onward)     Start     Ordered   10/02/21 1351  Urine Culture  Once,   STAT       Question:  Indication  Answer:  Urgency/frequency   10/02/21 1350            Vitals/Pain Today's Vitals   10/03/21 0809 10/03/21 1153 10/03/21 1700 10/03/21 2000  BP: (!) 182/80 (!) 193/79 (!) 193/97 (!) 154/70  Pulse: 84 82 73 85  Resp: (!) 25 17 18 20   Temp:      TempSrc:      SpO2: 92% 96% 96% 95%  Weight:      Height:      PainSc:        Isolation Precautions Airborne and Contact precautions  Medications Medications  heparin injection 5,000 Units (5,000 Units Subcutaneous Given 10/03/21 1430)  insulin glargine-yfgn (SEMGLEE) injection 15 Units (15 Units Subcutaneous Given 10/03/21 1056)  insulin aspart (novoLOG) injection 0-9 Units (0 Units Subcutaneous Not Given 10/03/21 1828)  insulin aspart (novoLOG) injection 0-5 Units (has no administration in time range)  vitamin B-12 (CYANOCOBALAMIN) tablet 1,000 mcg (has no administration in time range)  propranolol (INDERAL) tablet 20 mg (has no administration in time range)  diazepam (VALIUM) tablet 5 mg (has no administration in time range)  sodium chloride 0.9 % bolus 500 mL (0 mLs Intravenous Stopped 10/02/21 1957)  LORazepam (ATIVAN) injection 1 mg (1 mg Intravenous Given 10/02/21 2022)  LORazepam (ATIVAN) injection 1 mg (1 mg Intravenous Given 10/02/21 2054)  cyanocobalamin ((VITAMIN B-12)) injection 1,000 mcg (1,000 mcg Subcutaneous Given 10/03/21 1058)    Mobility walks with device

## 2021-10-03 NOTE — ED Notes (Addendum)
Bed alarm turned on. Patients bed locked in lowest position.

## 2021-10-03 NOTE — Progress Notes (Signed)
Echocardiogram 2D Echocardiogram has been performed.  Arlyss Gandy 10/03/2021, 4:10 PM

## 2021-10-03 NOTE — Assessment & Plan Note (Addendum)
Patient is on propranolol and losartan. Currently continuing propranolol.  Holding losartan and Lasix.

## 2021-10-03 NOTE — Assessment & Plan Note (Addendum)
Resolved. Likely toxic secondary to benzodiazepine. Monitor.

## 2021-10-03 NOTE — Assessment & Plan Note (Addendum)
Balance difficulty. Frequent fall. Essential tremor. B12 deficiency. Patient has multiple history of falls in the past. Presents with complaints of urinary incontinence and a fall.  Had dehydration and COVID-19 infection present on admission. CT head was unremarkable. MRI brain negative for acute stroke. B12 level severely low.  Will replace. PT OT consult.  Orthostatic vitals negative.  Still recommend abdominal binder as well as compression stockings. Meclizine as needed. Vestibular evaluation was also negative. Home health PT OT.

## 2021-10-04 DIAGNOSIS — I351 Nonrheumatic aortic (valve) insufficiency: Secondary | ICD-10-CM | POA: Diagnosis present

## 2021-10-04 DIAGNOSIS — E538 Deficiency of other specified B group vitamins: Secondary | ICD-10-CM | POA: Diagnosis present

## 2021-10-04 LAB — CBC WITH DIFFERENTIAL/PLATELET
Abs Immature Granulocytes: 0.04 10*3/uL (ref 0.00–0.07)
Basophils Absolute: 0.1 10*3/uL (ref 0.0–0.1)
Basophils Relative: 1 %
Eosinophils Absolute: 0.5 10*3/uL (ref 0.0–0.5)
Eosinophils Relative: 6 %
HCT: 35.6 % — ABNORMAL LOW (ref 36.0–46.0)
Hemoglobin: 11.5 g/dL — ABNORMAL LOW (ref 12.0–15.0)
Immature Granulocytes: 1 %
Lymphocytes Relative: 23 %
Lymphs Abs: 1.9 10*3/uL (ref 0.7–4.0)
MCH: 28.5 pg (ref 26.0–34.0)
MCHC: 32.3 g/dL (ref 30.0–36.0)
MCV: 88.1 fL (ref 80.0–100.0)
Monocytes Absolute: 0.8 10*3/uL (ref 0.1–1.0)
Monocytes Relative: 10 %
Neutro Abs: 5 10*3/uL (ref 1.7–7.7)
Neutrophils Relative %: 59 %
Platelets: 192 10*3/uL (ref 150–400)
RBC: 4.04 MIL/uL (ref 3.87–5.11)
RDW: 13.5 % (ref 11.5–15.5)
WBC: 8.2 10*3/uL (ref 4.0–10.5)
nRBC: 0 % (ref 0.0–0.2)

## 2021-10-04 LAB — COMPREHENSIVE METABOLIC PANEL
ALT: 13 U/L (ref 0–44)
AST: 16 U/L (ref 15–41)
Albumin: 3.4 g/dL — ABNORMAL LOW (ref 3.5–5.0)
Alkaline Phosphatase: 66 U/L (ref 38–126)
Anion gap: 7 (ref 5–15)
BUN: 23 mg/dL (ref 8–23)
CO2: 25 mmol/L (ref 22–32)
Calcium: 8.8 mg/dL — ABNORMAL LOW (ref 8.9–10.3)
Chloride: 104 mmol/L (ref 98–111)
Creatinine, Ser: 1.31 mg/dL — ABNORMAL HIGH (ref 0.44–1.00)
GFR, Estimated: 39 mL/min — ABNORMAL LOW (ref >60)
Glucose, Bld: 165 mg/dL — ABNORMAL HIGH (ref 70–99)
Potassium: 4.1 mmol/L (ref 3.5–5.1)
Sodium: 136 mmol/L (ref 135–145)
Total Bilirubin: 0.8 mg/dL (ref 0.3–1.2)
Total Protein: 6.5 g/dL (ref 6.5–8.1)

## 2021-10-04 LAB — GLUCOSE, CAPILLARY
Glucose-Capillary: 187 mg/dL — ABNORMAL HIGH (ref 70–99)
Glucose-Capillary: 184 mg/dL — ABNORMAL HIGH (ref 70–99)
Glucose-Capillary: 325 mg/dL — ABNORMAL HIGH (ref 70–99)
Glucose-Capillary: 252 mg/dL — ABNORMAL HIGH (ref 70–99)

## 2021-10-04 LAB — MAGNESIUM: Magnesium: 2.1 mg/dL (ref 1.7–2.4)

## 2021-10-04 LAB — URINE CULTURE: Culture: <10000 — AB

## 2021-10-04 LAB — C-REACTIVE PROTEIN: CRP: 2.4 mg/dL — ABNORMAL HIGH (ref <1.0)

## 2021-10-04 MED ORDER — ACETAMINOPHEN 325 MG PO TABS
650.0000 mg | ORAL_TABLET | Freq: Four times a day (QID) | ORAL | Status: DC | PRN
  Administered 2021-10-04: 650 mg via ORAL
  Filled 2021-10-04: qty 2

## 2021-10-04 MED ORDER — MIRABEGRON ER 25 MG PO TB24
50.0000 mg | ORAL_TABLET | Freq: Every morning | ORAL | Status: DC
  Administered 2021-10-04 – 2021-10-05 (×2): 50 mg via ORAL
  Filled 2021-10-04 (×2): qty 2

## 2021-10-04 MED ORDER — ONDANSETRON HCL 4 MG PO TABS: 4.0000 mg | ORAL_TABLET | Freq: Four times a day (QID) | ORAL | Status: DC | PRN

## 2021-10-04 MED ORDER — ONDANSETRON HCL 4 MG/2ML IJ SOLN: 4.0000 mg | Freq: Four times a day (QID) | INTRAMUSCULAR | Status: DC | PRN

## 2021-10-04 MED ORDER — DOCUSATE SODIUM 100 MG PO CAPS
100.0000 mg | ORAL_CAPSULE | Freq: Two times a day (BID) | ORAL | Status: DC
  Filled 2021-10-04: qty 1

## 2021-10-04 MED ORDER — SENNOSIDES-DOCUSATE SODIUM 8.6-50 MG PO TABS
1.0000 | ORAL_TABLET | Freq: Two times a day (BID) | ORAL | Status: DC
  Administered 2021-10-04 – 2021-10-05 (×3): 1 via ORAL
  Filled 2021-10-04 (×3): qty 1

## 2021-10-04 MED ORDER — MECLIZINE HCL 25 MG PO TABS
25.0000 mg | ORAL_TABLET | Freq: Three times a day (TID) | ORAL | Status: DC
  Administered 2021-10-04 – 2021-10-05 (×4): 25 mg via ORAL
  Filled 2021-10-04 (×4): qty 1

## 2021-10-04 MED ORDER — ACETAMINOPHEN 650 MG RE SUPP: 650.0000 mg | Freq: Four times a day (QID) | RECTAL | Status: DC | PRN

## 2021-10-04 MED ORDER — DULOXETINE HCL 20 MG PO CPEP
20.0000 mg | ORAL_CAPSULE | Freq: Two times a day (BID) | ORAL | Status: DC
  Administered 2021-10-04 – 2021-10-05 (×3): 20 mg via ORAL
  Filled 2021-10-04 (×3): qty 1

## 2021-10-04 MED ORDER — OXYBUTYNIN CHLORIDE ER 5 MG PO TB24
10.0000 mg | ORAL_TABLET | Freq: Every morning | ORAL | Status: DC
  Administered 2021-10-04 – 2021-10-05 (×2): 10 mg via ORAL
  Filled 2021-10-04 (×2): qty 2

## 2021-10-04 NOTE — Assessment & Plan Note (Signed)
Echocardiogram shows moderate aortic regurgitation pretension I do not think that is representing patient's presentation with balance issues. Monitor.

## 2021-10-04 NOTE — Assessment & Plan Note (Addendum)
Continue on Inderal.

## 2021-10-04 NOTE — Progress Notes (Signed)
°  Progress Note   Patient: [name] YBW:[id number] DOB: 07-01-1933 DOA: 10/02/2021     1 DOS: the patient was seen and examined on 10/04/2021   Brief hospital course: No notes on file  Assessment and Plan Ataxia Balance difficulty. Frequent fall. Essential tremor. B12 deficiency. Patient has multiple history of falls in the past. Presents with complaints of urinary incontinence and a fall.  Had dehydration and COVID-19 infection present on admission. CT head was unremarkable. MRI brain negative for acute stroke. B12 level severely low.  Will replace. PT OT consult.  Orthostatic vitals negative. Initiate meclizine.  PT evaluation as well as vestibular evaluation still pending.  Acute encephalopathy- (present on admission) Resolved. Likely toxic secondary to benzodiazepine. Monitor.  COVID-19 virus infection- (present on admission) 2 weeks ago COVID-positive.  Currently on room air. Per family patient was treated with Paxlovid.  CT value 36.8. Will discontinue isolation.  HTN (hypertension)- (present on admission) Patient is on propranolol and losartan. Currently continuing propranolol.  Holding losartan and Lasix.  Tremor, essential- (present on admission) Continue on Inderal.  B12 deficiency- (present on admission) Continue replacement  Moderate Aortic regurgitation- (present on admission) Echocardiogram shows moderate aortic regurgitation pretension I do not think that is representing patient's presentation with balance issues. Monitor.  Left ventricular diastolic dysfunction with preserved systolic function Echocardiogram 65 to 70% EF.  Grade 1 diastolic dysfunction.  Chronic kidney disease, stage 3a ([place])- (present on admission) Acute kidney injury on CKD 3a.  Serum creatinine 1.39.   Likely from poor p.o. intake in the setting of COVID-19 infection.  Hold losartan.  Still poor p.o. intake.  Monitor.      Subjective: Continues to have shortness of breath  and nausea, no vomiting.  No fever no chills.  No abdominal pain.  No BM.  Continues to report fatigue and dizziness.  Objective Vitals:   10/04/21 0617 10/04/21 0935 10/04/21 0936 10/04/21 1337  BP: 134/75 (!) 149/55 (!) 149/55 (!) 134/56  Pulse: 79 77 76 73  Resp: 20  17 17   Temp: 98.1 F (36.7 C)  98.8 F (37.1 C) 98.2 F (36.8 C)  TempSrc: Oral  Oral Oral  SpO2: 97%  97% 97%  Weight:      Height:        General: Appear in mild distress, no Rash; Oral Mucosa Clear, moist. no Abnormal Neck Mass Or lumps, Conjunctiva normal  Cardiovascular: S1 and S2 Present, no Murmur, Respiratory: good respiratory effort, Bilateral Air entry present and CTA, no Crackles, no wheezes Abdomen: Bowel Sound present, Soft and no tenderness Extremities: no Pedal edema Neurology: alert and oriented to time, place, and person affect appropriate. no new focal deficit Gait not checked due to patient safety concerns   Data Reviewed:  I have Reviewed nursing notes, Vitals, and Lab results since pt's last encounter. Pertinent lab results CBC shows stable hemoglobin and WBC.  CRP mildly elevated 2.4.  CMP shows worsening serum creatinine again from 1.18-1.31 I have ordered test including BMP    Family Communication: Granddaughter at bedside.  Disposition: Status is: Inpatient  Remains inpatient appropriate because: Ongoing dizziness and lightheadedness as well as acute kidney injury requiring close monitoring and observation.     Author: Berle Mull, MD 10/04/2021 6:18 PM  For on call review [email or web address].

## 2021-10-04 NOTE — Assessment & Plan Note (Signed)
Continue replacement 

## 2021-10-04 NOTE — Assessment & Plan Note (Signed)
Echocardiogram 65 to 70% EF.  Grade 1 diastolic dysfunction.

## 2021-10-04 NOTE — Evaluation (Signed)
Occupational Therapy Evaluation Patient Details Name: [name] MRN: [id number] DOB: 1933-02-03 Today's Date: 10/04/2021   History of Present Illness [name] is a pleasant 88 y.o. female with medical history significant for insulin-dependent diabetes mellitus, CKD 3, hypertension, chronic back pain, and tremor, now presenting to the emergency department with frequent falls.  Found to be COVID+ and patient was initially confused and diagnosed with Acute encephalopathy   Clinical Impression   [name] is an 88 year old woman who lives with her granddaughter and typically ambulates with rollator and is independent with ADLs.  On evaluation she presents with generalized weakness, decreased activity tolerance and impaired balance. She was supervision to transfer to side of bed with some difficulty and overall min assist with all standing and ambulation with walker due to complaints of weakness and light headedness. She reported a history of orthostatic hypotension when seated at side of bed. She reported light headedness. When BP checked it was 170/81. In standing it dropped to 140/54. She was symptomatic - reporting light headedness and weakness. Patient  transferred to the [facility] at her request and BP in sitting was 148/74 .In standing after taking steps to recliner she was symptomatic- with lightheadedness, weakness, and she began to lean in to me and her legs began to buckle and couldn't maintain the full stand for blood pressure. BP 131/71 once it read. BP 174/73 when I left her in the chair. She reported yesterday the longer she sat up the weaker she felt. She demonstrated the ability to stand and clean herself and manage her socks throughout the evaluation and treatment. Expect once symptoms resolve she should be able to go home at discharge with frequent supervision from family. Patient will benefit from skilled OT services while in hospital to improve deficits and learn compensatory  strategies as needed in order to return to PLOF.       Recommendations for follow up therapy are one component of a multi-disciplinary discharge planning process, led by the attending physician.  Recommendations may be updated based on patient status, additional functional criteria and insurance authorization.   Follow Up Recommendations  Home health OT    Assistance Recommended at Discharge Frequent or constant Supervision/Assistance  Patient can return home with the following A little help with walking and/or transfers;A little help with bathing/dressing/bathroom;Assistance with cooking/housework    Functional Status Assessment  Patient has had a recent decline in their functional status and demonstrates the ability to make significant improvements in function in a reasonable and predictable amount of time.  Equipment Recommendations  Other (comment) (TBD)    Recommendations for Other Services       Precautions / Restrictions Precautions Precautions: None Restrictions Weight Bearing Restrictions: No      Mobility Bed Mobility                    Transfers                          Balance Overall balance assessment: Needs assistance Sitting-balance support: No upper extremity supported, Feet supported Sitting balance-Leahy Scale: Fair     Standing balance support: Single extremity supported Standing balance-Leahy Scale: Fair                             ADL either performed or assessed with clinical judgement   ADL Overall ADL's : Needs assistance/impaired Eating/Feeding:  Independent   Grooming: Set up;Sitting   Upper Body Bathing: Set up;Sitting   Lower Body Bathing: Min guard;Sit to/from stand   Upper Body Dressing : Set up;Sitting   Lower Body Dressing: Min guard;Sit to/from stand   Toilet Transfer: Min guard;BSC/3in1;Rolling walker (2 wheels)   Toileting- Water quality scientist and Hygiene: Moderate assistance Toileting -  Clothing Manipulation Details (indicate cue type and reason): mod assist for clothing management     Functional mobility during ADLs: Minimal assistance;Rolling walker (2 wheels) General ADL Comments: Overall supervision to transfer to side of bed and min assist for all standing due to patient's complaints of light headedness. Able to transfer to [facility] and then take several steps to recliner. Reports increasing weakness and lightheadedness the longer she stands.     Vision Patient Visual Report: No change from baseline       Perception     Praxis      Pertinent Vitals/Pain Pain Assessment Pain Assessment: No/denies pain     Hand Dominance Right   Extremity/Trunk Assessment Upper Extremity Assessment Upper Extremity Assessment: Generalized weakness   Lower Extremity Assessment Lower Extremity Assessment: Defer to PT evaluation   Cervical / Trunk Assessment Cervical / Trunk Assessment: Normal   Communication Communication Communication: No difficulties   Cognition Arousal/Alertness: Awake/alert Behavior During Therapy: WFL for tasks assessed/performed Overall Cognitive Status: Within Functional Limits for tasks assessed                                       General Comments       Exercises     Shoulder Instructions      Home Living Family/patient expects to be discharged to:: Private residence Living Arrangements: Children Available Help at Discharge: Family;Available 24 hours/day Type of Home: House Home Access: Stairs to enter [employer] of Steps: 6 Entrance Stairs-Rails: Right Home Layout: One level     Bathroom Shower/Tub: Teacher, early years/pre: Handicapped height     Home Equipment: Rollator (4 wheels)   Additional Comments: Patient just moved in with her granddaughter      Prior Functioning/Environment Prior Level of Function : Independent/Modified Independent             Mobility Comments: uses  rollator ADLs Comments: independent with ADLs up until 2 weeks ago        OT Problem List: Decreased strength;Decreased activity tolerance;Impaired balance (sitting and/or standing);Decreased knowledge of use of DME or AE;Obesity;Cardiopulmonary status limiting activity      OT Treatment/Interventions: Self-care/ADL training;Therapeutic exercise;DME and/or AE instruction;Therapeutic activities;Balance training;Patient/family education    OT Goals(Current goals can be found in the care plan section) Acute Rehab OT Goals Patient Stated Goal: to feel stronger OT Goal Formulation: With patient Time For Goal Achievement: 10/18/21 Potential to Achieve Goals: Good  OT Frequency: Min 2X/week    Co-evaluation              AM-PAC OT "6 Clicks" Daily Activity     Outcome Measure Help from another person eating meals?: None Help from another person taking care of personal grooming?: A Little Help from another person toileting, which includes using toliet, bedpan, or urinal?: A Little Help from another person bathing (including washing, rinsing, drying)?: A Little Help from another person to put on and taking off regular upper body clothing?: A Little Help from another person to put on and taking off regular lower  body clothing?: A Little 6 Click Score: 19   End of Session Equipment Utilized During Treatment: Rolling walker (2 wheels)  Activity Tolerance: Other (comment) (blood pressure?) Patient left:    OT Visit Diagnosis: Unsteadiness on feet (R26.81);Muscle weakness (generalized) (M62.81)                Time: 4239-5320 OT Time Calculation (min): 40 min Charges:  OT General Charges $OT Visit: 1 Visit OT Evaluation $OT Eval Moderate Complexity: 1 Mod OT Treatments $Self Care/Home Management : 8-22 mins  Phineas Mcenroe, OTR/L [place] [phone] Pager: [phone]   Lenward Chancellor 10/04/2021, 2:46 PM

## 2021-10-04 NOTE — Plan of Care (Signed)

## 2021-10-05 DIAGNOSIS — W19XXXA Unspecified fall, initial encounter: Secondary | ICD-10-CM

## 2021-10-05 LAB — GLUCOSE, CAPILLARY
Glucose-Capillary: 168 mg/dL — ABNORMAL HIGH (ref 70–99)
Glucose-Capillary: 284 mg/dL — ABNORMAL HIGH (ref 70–99)
Glucose-Capillary: 168 mg/dL — ABNORMAL HIGH (ref 70–99)

## 2021-10-05 LAB — COMPREHENSIVE METABOLIC PANEL
ALT: 12 U/L (ref 0–44)
AST: 15 U/L (ref 15–41)
Albumin: 3.3 g/dL — ABNORMAL LOW (ref 3.5–5.0)
Alkaline Phosphatase: 65 U/L (ref 38–126)
Anion gap: 5 (ref 5–15)
BUN: 24 mg/dL — ABNORMAL HIGH (ref 8–23)
CO2: 27 mmol/L (ref 22–32)
Calcium: 8.7 mg/dL — ABNORMAL LOW (ref 8.9–10.3)
Chloride: 105 mmol/L (ref 98–111)
Creatinine, Ser: 1.23 mg/dL — ABNORMAL HIGH (ref 0.44–1.00)
GFR, Estimated: 43 mL/min — ABNORMAL LOW (ref >60)
Glucose, Bld: 206 mg/dL — ABNORMAL HIGH (ref 70–99)
Potassium: 4.3 mmol/L (ref 3.5–5.1)
Sodium: 137 mmol/L (ref 135–145)
Total Bilirubin: 0.6 mg/dL (ref 0.3–1.2)
Total Protein: 6.4 g/dL — ABNORMAL LOW (ref 6.5–8.1)

## 2021-10-05 LAB — CBC WITH DIFFERENTIAL/PLATELET
Abs Immature Granulocytes: 0.04 10*3/uL (ref 0.00–0.07)
Basophils Absolute: 0.1 10*3/uL (ref 0.0–0.1)
Basophils Relative: 1 %
Eosinophils Absolute: 0.5 10*3/uL (ref 0.0–0.5)
Eosinophils Relative: 6 %
HCT: 36.5 % (ref 36.0–46.0)
Hemoglobin: 11.7 g/dL — ABNORMAL LOW (ref 12.0–15.0)
Immature Granulocytes: 1 %
Lymphocytes Relative: 32 %
Lymphs Abs: 2.4 10*3/uL (ref 0.7–4.0)
MCH: 28.7 pg (ref 26.0–34.0)
MCHC: 32.1 g/dL (ref 30.0–36.0)
MCV: 89.5 fL (ref 80.0–100.0)
Monocytes Absolute: 0.8 10*3/uL (ref 0.1–1.0)
Monocytes Relative: 10 %
Neutro Abs: 3.6 10*3/uL (ref 1.7–7.7)
Neutrophils Relative %: 50 %
Platelets: 201 10*3/uL (ref 150–400)
RBC: 4.08 MIL/uL (ref 3.87–5.11)
RDW: 13.5 % (ref 11.5–15.5)
WBC: 7.3 10*3/uL (ref 4.0–10.5)
nRBC: 0 % (ref 0.0–0.2)

## 2021-10-05 LAB — MAGNESIUM: Magnesium: 2.1 mg/dL (ref 1.7–2.4)

## 2021-10-05 MED ORDER — MECLIZINE HCL 25 MG PO TABS: 25.0000 mg | ORAL_TABLET | Freq: Three times a day (TID) | ORAL | 0 refills | Status: AC | PRN

## 2021-10-05 MED ORDER — INSULIN LISPRO PROT & LISPRO (75-25 MIX) 100 UNIT/ML KWIKPEN: 25.0000 [IU] | PEN_INJECTOR | Freq: Every day | SUBCUTANEOUS | 1 refills | Status: DC

## 2021-10-05 MED ORDER — CYANOCOBALAMIN 1000 MCG PO TABS: 1000.0000 ug | ORAL_TABLET | Freq: Every day | ORAL | 0 refills | Status: AC

## 2021-10-05 MED ORDER — INSULIN LISPRO PROT & LISPRO (75-25 MIX) 100 UNIT/ML KWIKPEN: 45.0000 [IU] | PEN_INJECTOR | Freq: Every day | SUBCUTANEOUS | 1 refills | Status: DC

## 2021-10-05 NOTE — Evaluation (Addendum)
Physical Therapy Evaluation Patient Details Name: [name] MRN: [id number] DOB: 1933-05-06 Today's Date: 10/05/2021  History of Present Illness  88 y.o. female with medical history significant for insulin-dependent diabetes mellitus, CKD 3, hypertension, chronic back pain, and tremor, now presenting to the emergency department with frequent falls.  Found to be COVID+ and patient was initially confused and diagnosed with Acute encephalopathy  Clinical Impression  On eval, pt required Min A for mobility. She walked ~12 feet x 2 with a RW. Ambulation distance was limited by pt report of fatigue, "wooziness", and weakness. She denied spinning during session. She denied feeling as if she would pass out. BP 132/63 while standing at bedside and 168/78 after walking. Pt presents with general weakness, decreased activity tolerance and impaired gait/balance. Discussed d/c plan with pt/family-plan is for home but pt needs to be able to demonstrate safe ability to walk a household ambulation distance and ascend/descend a few steps with +1 assist. Will plan to follow up on tomorrow and attempt to progress mobility as safely able.   Briefly initiated vestibular assessment during today's session. Pt reports distant hx of experiencing dizziness/spinning when returning to supine from sitting. Pt had difficulty with smooth pursuit and saccades (specifically when asked to look up-she could not keep her eyes fixated on the target). Also I thought I saw brief period of geotropic nystagmus during R supine head roll test however pt denied symptoms. When tested a 2nd time, I did not see any nystagmus nor did pt have any symptoms. Will plan to follow up on tomorrow and continue vestibular testing if warranted.        Recommendations for follow up therapy are one component of a multi-disciplinary discharge planning process, led by the attending physician.  Recommendations may be updated based on patient status, additional  functional criteria and insurance authorization.  Follow Up Recommendations Home health PT    Assistance Recommended at Discharge Frequent or constant Supervision/Assistance  Patient can return home with the following  A little help with walking and/or transfers;A little help with bathing/dressing/bathroom;Assistance with cooking/housework;Assist for transportation;Help with stairs or ramp for entrance    Equipment Recommendations None recommended by PT  Recommendations for Other Services       Functional Status Assessment Patient has had a recent decline in their functional status and demonstrates the ability to make significant improvements in function in a reasonable and predictable amount of time.     Precautions / Restrictions Precautions Precautions: Fall Restrictions Weight Bearing Restrictions: No      Mobility  Bed Mobility Overal bed mobility: Needs Assistance Bed Mobility: Sit to Supine       Sit to supine: Min assist   General bed mobility comments: Small amount of assist. Increased time.    Transfers Overall transfer level: Needs assistance Equipment used: Rolling walker (2 wheels) Transfers: Sit to/from Stand Sit to Stand: Min assist           General transfer comment: Assist to rise, steady, control descent Cues for safety, technique, hand placement. x 4 during session. Increased time.    Ambulation/Gait Ambulation/Gait assistance: Min assist Gait Distance (Feet): 12 Feet (x2) Assistive device: Rolling walker (2 wheels) Gait Pattern/deviations: Step-through pattern, Decreased stride length       General Gait Details: Assist to stabilize pt throughout short distance. Cues for safety, RW proximity. Ambulation distance limited by pt report of "wooziness" weakness". She denies spinning or feeling like she will pass out. Followed closely with recliner vs  walked around room where she could quickly sit if need be.  Stairs            Wheelchair  Mobility    Modified Rankin (Stroke Patients Only)       Balance Overall balance assessment: Needs assistance         Standing balance support: Bilateral upper extremity supported, During functional activity Standing balance-Leahy Scale: Fair                               Pertinent Vitals/Pain Pain Assessment Pain Assessment: No/denies pain    Home Living                          Prior Function                       Hand Dominance        Extremity/Trunk Assessment   Upper Extremity Assessment Upper Extremity Assessment: Defer to OT evaluation    Lower Extremity Assessment Lower Extremity Assessment: Generalized weakness    Cervical / Trunk Assessment Cervical / Trunk Assessment: Normal  Communication      Cognition Arousal/Alertness: Awake/alert Behavior During Therapy: WFL for tasks assessed/performed Overall Cognitive Status: Within Functional Limits for tasks assessed                                          General Comments      Exercises     Assessment/Plan    PT Assessment Patient needs continued PT services  PT Problem List Decreased strength;Decreased mobility;Decreased activity tolerance;Decreased balance;Decreased knowledge of use of DME       PT Treatment Interventions DME instruction;Therapeutic activities;Cognitive remediation;Gait training;Therapeutic exercise;Patient/family education;Balance training;Functional mobility training;Stair training    PT Goals (Current goals can be found in the Care Plan section)  Acute Rehab PT Goals Patient Stated Goal: home with granddaughter PT Goal Formulation: With patient/family Time For Goal Achievement: 10/19/21 Potential to Achieve Goals: Good    Frequency Min 3X/week     Co-evaluation               AM-PAC PT "6 Clicks" Mobility  Outcome Measure Help needed turning from your back to your side while in a flat bed without using  bedrails?: A Little Help needed moving from lying on your back to sitting on the side of a flat bed without using bedrails?: A Little Help needed moving to and from a bed to a chair (including a wheelchair)?: A Little Help needed standing up from a chair using your arms (e.g., wheelchair or bedside chair)?: A Little Help needed to walk in hospital room?: A Little Help needed climbing 3-5 steps with a railing? : A Little 6 Click Score: 18    End of Session Equipment Utilized During Treatment: Gait belt Activity Tolerance: Patient limited by fatigue (limited by weakness) Patient left: in bed;with call bell/phone within reach;with bed alarm set;with family/visitor present   PT Visit Diagnosis: Unsteadiness on feet (R26.81);Muscle weakness (generalized) (M62.81);Difficulty in walking, not elsewhere classified (R26.2)    Time: 4481-8563 PT Time Calculation (min) (ACUTE ONLY): 45 min   Charges:   PT Evaluation $PT Eval Moderate Complexity: 1 Mod PT Treatments $Gait Training: 23-37 mins         Janayah Zavada P, PT  Acute Rehabilitation  Office: [phone] Pager: [phone]

## 2021-10-05 NOTE — Plan of Care (Signed)

## 2021-10-05 NOTE — Progress Notes (Signed)
. °  Transition of Care [facility]) Screening Note   Patient Details  Name: [name] Date of Birth: 04/24/1933   [facility] of [facility]) CM/SW Contact:    Illene Regulus, LCSW Phone Number: 10/05/2021, 12:30 PM    Transition of Care Department [facility]) has reviewed patient and no TOC needs have been identified at this time. We will continue to monitor patient advancement through interdisciplinary progression rounds. If new patient transition needs arise, please place a TOC consult.

## 2021-10-05 NOTE — Progress Notes (Signed)
Patient discharged home with family members, AVS discharge instructions reviewed and given to patient.

## 2021-10-05 NOTE — Progress Notes (Signed)
Physical Therapy Treatment Patient Details Name: [name] MRN: [id number] DOB: 14-Feb-1933 Today's Date: 10/05/2021   History of Present Illness 88 y.o. female with medical history significant for insulin-dependent diabetes mellitus, CKD 3, hypertension, chronic back pain, and tremor, now presenting to the emergency department with frequent falls.  Found to be COVID+ and patient was initially confused and diagnosed with Acute encephalopathy    PT Comments    Received secure chat from MD stating that he did not have a medical reason to keep pt in the hospital. Reported to him what caregiver stated to me regarding her comfort level caring for pt in current condition. Agreed to see pt a 2nd time to see if she performed any better. Pt was able to ambulate ~50 feet and ascend/descend 2 steps with use of railings. Pt did not report any dizziness during session. Granddaughter/caregiver stated she would need to see pt be able to complete those 2 tasks before feeling comfortable taking her home. Family and caregiver were present during session. Granddaughter/Caregiver stated she felt comfortable with patient's performance/mobility this session. Issued gait belt for pt. Made MD aware.     Recommendations for follow up therapy are one component of a multi-disciplinary discharge planning process, led by the attending physician.  Recommendations may be updated based on patient status, additional functional criteria and insurance authorization.  Follow Up Recommendations  Home health PT     Assistance Recommended at Discharge Frequent or constant Supervision/Assistance  Patient can return home with the following A little help with walking and/or transfers;A little help with bathing/dressing/bathroom;Assistance with cooking/housework;Assist for transportation;Help with stairs or ramp for entrance   Equipment Recommendations  None recommended by PT    Recommendations for Other Services        Precautions / Restrictions Precautions Precautions: Fall Restrictions Weight Bearing Restrictions: No     Mobility  Bed Mobility Overal bed mobility: Needs Assistance Bed Mobility: Supine to Sit     Supine to sit: Supervision    General bed mobility comments: Supv for safety. Increased time. No dizziness reported.    Transfers Overall transfer level: Needs assistance Equipment used: Rolling walker (2 wheels) Transfers: Sit to/from Stand Sit to Stand: From elevated surface, Min assist           General transfer comment: Assist to steady. Cues for safety, technique, hand placement.    Ambulation/Gait Ambulation/Gait assistance: Min assist Gait Distance (Feet): 50 Feet Assistive device: Rolling walker (2 wheels) Gait Pattern/deviations: Step-through pattern, Decreased stride length       General Gait Details: Interimttent assist to steady. Cues for safety. Pt tolerated distance well. Followed with recliner for safety and used it to transport pt back to the room. No dizziness reported.   Stairs Stairs: Yes Stairs assistance: Min guard Stair Management: Step to pattern, Forwards, Two rails Number of Stairs: 2 General stair comments: Pt ascended/descended 2 steps with use of railings. Cues for safety.   Wheelchair Mobility    Modified Rankin (Stroke Patients Only)       Balance Overall balance assessment: Needs assistance         Standing balance support: Bilateral upper extremity supported, Reliant on assistive device for balance, During functional activity Standing balance-Leahy Scale: Poor                              Cognition Arousal/Alertness: Awake/alert Behavior During Therapy: WFL for tasks assessed/performed Overall Cognitive Status: Within Functional  Limits for tasks assessed                                          Exercises      General Comments        Pertinent Vitals/Pain Pain Assessment Pain  Assessment: No/denies pain    Home Living                          Prior Function            PT Goals (current goals can now be found in the care plan section) Acute Rehab PT Goals Patient Stated Goal: home with granddaughter PT Goal Formulation: With patient/family Time For Goal Achievement: 10/19/21 Potential to Achieve Goals: Good Progress towards PT goals: Progressing toward goals    Frequency    Min 3X/week      PT Plan Current plan remains appropriate    Co-evaluation              AM-PAC PT "6 Clicks" Mobility   Outcome Measure  Help needed turning from your back to your side while in a flat bed without using bedrails?: A Little Help needed moving from lying on your back to sitting on the side of a flat bed without using bedrails?: A Little Help needed moving to and from a bed to a chair (including a wheelchair)?: A Little Help needed standing up from a chair using your arms (e.g., wheelchair or bedside chair)?: A Little Help needed to walk in hospital room?: A Little Help needed climbing 3-5 steps with a railing? : A Little 6 Click Score: 18    End of Session Equipment Utilized During Treatment: Gait belt Activity Tolerance: Patient tolerated treatment well Patient left: in chair;with call bell/phone within reach;with family/visitor present;with chair alarm set   PT Visit Diagnosis: Unsteadiness on feet (R26.81);Muscle weakness (generalized) (M62.81);Difficulty in walking, not elsewhere classified (R26.2)     Time: 8333-8329 PT Time Calculation (min) (ACUTE ONLY): 16 min  Charges:  $Gait Training: 8-22 mins                         Doreatha Massed, PT Acute Rehabilitation  Office: [phone] Pager: [phone]

## 2021-10-06 ENCOUNTER — Ambulatory Visit: Payer: Medicare Other | Admitting: Endocrinology

## 2021-10-06 NOTE — Discharge Summary (Signed)
Physician Discharge Summary   Patient: [name] MRN: [id number] DOB: [birth year]  Admit date:     10/02/2021  Discharge date: 10/05/2021  Discharge Physician: Berle Mull   PCP: Aretta Nip, MD   Recommendations at discharge:   Follow-up with PCP as recommended. Follow-up with urology discuss about indication for both Myrbetriq and oxybutynin at the same time. Follow-up with endocrinology discussed about insulin dosage as patient has persistent fasting hyperglycemia at home and poor diet education.  Required only 20+ units per day in the hospital. Follow-up with cardiology as recommended for aortic regurgitation Check BMP in 1 week.   Discharge Diagnoses Active Problems:   Ataxia   Acute encephalopathy   COVID-19 virus infection   HTN (hypertension)   Tremor, essential   Chronic kidney disease, stage 3a (HCC)   Diabetes mellitus with complication (HCC)   Left ventricular diastolic dysfunction with preserved systolic function   Memory disorder   Balance problems   Moderate Aortic regurgitation   B12 deficiency  Resolved Problems:   * No resolved hospital problems. [facility]   No notes on file  Ataxia Balance difficulty. Frequent fall. Essential tremor. B12 deficiency. Patient has multiple history of falls in the past. Presents with complaints of urinary incontinence and a fall.  Had dehydration and COVID-19 infection present on admission. CT head was unremarkable. MRI brain negative for acute stroke. B12 level severely low.  Will replace. PT OT consult.  Orthostatic vitals negative.  Still recommend abdominal binder as well as compression stockings. Meclizine as needed. Vestibular evaluation was also negative. Home health PT OT.  Acute encephalopathy- (present on admission) Resolved. Likely toxic secondary to benzodiazepine. Monitor.  COVID-19 virus infection- (present on admission) 2 weeks ago COVID-positive.  Currently on room  air. Per family patient was treated with Paxlovid.  CT value 36.8. Will discontinue isolation.  HTN (hypertension)- (present on admission) Patient is on propranolol and losartan. Currently continuing propranolol.  Holding losartan and Lasix.  Tremor, essential- (present on admission) Continue on Inderal.  B12 deficiency- (present on admission) Continue replacement  Moderate Aortic regurgitation- (present on admission) Echocardiogram shows moderate aortic regurgitation pretension I do not think that is representing patient's presentation with balance issues. Monitor.  Left ventricular diastolic dysfunction with preserved systolic function Echocardiogram 65 to 70% EF.  Grade 1 diastolic dysfunction.  Chronic kidney disease, stage 3a ([place])- (present on admission) Acute kidney injury on CKD 3a.  Serum creatinine 1.39.   Likely from poor p.o. intake in the setting of COVID-19 infection.  Hold losartan.  Still poor p.o. intake.  Monitor.       Consultants: none Procedures performed: Echocardiogram   Disposition: Home Diet recommendation: Cardiac diet  DISCHARGE MEDICATION: Allergies as of 10/05/2021       Reactions   Ativan [lorazepam] Anxiety   Aggressive, irritability, anxiety, opposite effect of what ativan should have done Can tolerate valium.    Ciprofloxacin Other (See Comments)   Acute kidney failure (loss of balance reported by [facility] physicians)   Codeine Nausea And Vomiting   Dilaudid [hydromorphone Hcl] Nausea And Vomiting, Other (See Comments)   Severe sweating   Mysoline [primidone] Nausea Only   Oxycodone Nausea And Vomiting        Medication List     STOP taking these medications    losartan 50 MG tablet Commonly known as: COZAAR   sulfamethoxazole-trimethoprim 800-160 MG tablet Commonly known as: BACTRIM DS       TAKE these  medications    acetaminophen 500 MG tablet Commonly known as: TYLENOL Take 1,000 mg by mouth every 6 (six) hours as  needed for headache (pain).   B-D ULTRAFINE III SHORT PEN 31G X 8 MM Misc Generic drug: Insulin Pen Needle USE TWICE A DAY AS DIRECTED   cyanocobalamin 1000 MCG tablet Take 1 tablet (1,000 mcg total) by mouth daily.   DULoxetine 20 MG capsule Commonly known as: CYMBALTA TAKE 1 CAPSULE BY MOUTH TWICE A DAY What changed: how to take this   furosemide 20 MG tablet Commonly known as: LASIX Take 20 mg by mouth daily as needed for fluid or edema.   glucose blood test strip Use as instructed   Insulin Lispro Prot & Lispro (75-25) 100 UNIT/ML Kwikpen Commonly known as: HumaLOG Mix 75/25 KwikPen Inject 45 Units into the skin daily with breakfast.   meclizine 25 MG tablet Commonly known as: ANTIVERT Take 1 tablet (25 mg total) by mouth 3 (three) times daily as needed for dizziness.   Microlet Lancets Misc Use to monitor glucose levels BID   mirabegron ER 50 MG Tb24 tablet Commonly known as: MYRBETRIQ Take 50 mg by mouth every morning.   Needles & Syringes Misc 1 Package by Does not apply route 2 (two) times daily with a meal.   oxybutynin 10 MG 24 hr tablet Commonly known as: DITROPAN-XL Take 10 mg by mouth every morning.   propranolol 10 MG tablet Commonly known as: INDERAL TAKE 1 TABLET BY MOUTH 4 TIMES DAILY. What changed:  how much to take when to take this   Trulicity 3 NW/2.9FA Sopn Generic drug: Dulaglutide Inject 3 mg as directed once a week. What changed:  how to take this when to take this additional instructions        Follow-up Information     Rankins, Bill Salinas, MD. Schedule an appointment as soon as possible for a visit in 1 week(s).   Specialty: Family Medicine Contact information: [place] [address] [phone]         Adrian Prows, MD. Schedule an appointment as soon as possible for a visit in 2 month(s).   Specialty: Cardiology Contact information: [place]  [address] [phone]         Urology. Schedule an appointment as soon as possible for a visit in 2 week(s).   Why: Discussed about oxybutynin and Myrbetriq.        Endocrinology. Schedule an appointment as soon as possible for a visit in 2 week(s).   Why: Discussed insulin regimen, given elevated fasting CBGs.                Discharge Exam: Filed Weights   10/02/21 1348  Weight: 99.8 kg   Vitals:   10/05/21 0538 10/05/21 1221 10/05/21 1456 10/05/21 1514  BP: (!) 155/73 (!) 147/64 (!) 190/83 (!) 150/74  Pulse: 69 70 81   Resp: 18  15   Temp: 97.8 F (36.6 C)  97.8 F (36.6 C)   TempSrc: Oral  Oral   SpO2: 97%  99%   Weight:      Height:        General: Appear in mild distress, no Rash; Oral Mucosa Clear, moist. no Abnormal Neck Mass Or lumps, Conjunctiva normal  Cardiovascular: S1 and S2 Present, aortic systolic Murmur, Respiratory: good respiratory effort, Bilateral Air entry present and CTA, no Crackles, no wheezes Abdomen: Bowel Sound present, Soft and no tenderness Extremities:  no Pedal edema Neurology: alert and oriented to time, place, and person affect appropriate. no new focal deficit Gait not checked due to patient safety concerns   Condition at discharge: good  The results of significant diagnostics from this hospitalization (including imaging, microbiology, ancillary and laboratory) are listed below for reference.   Imaging Studies: CT Head Wo Contrast  Result Date: 10/02/2021 CLINICAL DATA:  Trauma, generalized weakness EXAM: CT HEAD WITHOUT CONTRAST TECHNIQUE: Contiguous axial images were obtained from the base of the skull through the vertex without intravenous contrast. RADIATION DOSE REDUCTION: This exam was performed according to the departmental dose-optimization program which includes automated exposure control, adjustment of the mA and/or kV according to patient size and/or use of iterative reconstruction technique. COMPARISON:  12/23/2016  FINDINGS: Brain: No acute intracranial findings are seen. Cortical sulci are prominent. There is no shift of midline structures. There is decreased density in the periventricular white matter. There are small calcifications in the basal ganglia on both sides. Vascular: Unremarkable. Skull: No fracture is seen in the calvarium. Sinuses/Orbits: Unremarkable. Other: No significant interval changes are noted. IMPRESSION: No acute intracranial findings are seen in noncontrast CT brain. Atrophy. Small-vessel disease. Electronically Signed   By: Elmer Picker M.D.   On: 10/02/2021 18:11   MR Brain Wo Contrast (neuro protocol)  Result Date: 10/03/2021 CLINICAL DATA:  Acute neurologic deficit unsteady gait EXAM: MRI HEAD WITHOUT CONTRAST TECHNIQUE: Multiplanar, multiecho pulse sequences of the brain and surrounding structures were obtained without intravenous contrast. COMPARISON:  12/04/2019 FINDINGS: Brain: No acute infarct, mass effect or extra-axial collection. No acute or chronic hemorrhage. There is multifocal hyperintense T2-weighted signal within the white matter. Generalized volume loss without a clear lobar predilection. The midline structures are normal. Vascular: Major flow voids are preserved. Skull and upper cervical spine: Normal calvarium and skull base. Visualized upper cervical spine and soft tissues are normal. Sinuses/Orbits:No paranasal sinus fluid levels or advanced mucosal thickening. No mastoid or middle ear effusion. Normal orbits. IMPRESSION: 1. No acute intracranial abnormality. 2. Findings of chronic small vessel ischemia and volume loss. Electronically Signed   By: Ulyses Jarred M.D.   On: 10/03/2021 23:25   DG Chest Port 1 View  Result Date: 10/02/2021 CLINICAL DATA:  Generalized weakness, COVID positive EXAM: PORTABLE CHEST 1 VIEW COMPARISON:  05/14/2017 FINDINGS: The heart size and mediastinal contours are within normal limits. Both lungs are clear. There is previous surgical  fusion in the lower cervical spine. IMPRESSION: No active disease. Electronically Signed   By: Elmer Picker M.D.   On: 10/02/2021 17:12   DG Shoulder Left  Result Date: 10/02/2021 CLINICAL DATA:  Left shoulder pain after hitting her shoulder on a counter in a fall. EXAM: LEFT SHOULDER - 2+ VIEW COMPARISON:  Portable chest obtained earlier today. FINDINGS: No fracture or dislocation. Mild acromioclavicular spur formation, mild greater tuberosity hyperostosis and minimal inferior glenohumeral spur formation. IMPRESSION: 1. No fracture or dislocation. 2. Mild degenerative changes. Electronically Signed   By: Claudie Revering M.D.   On: 10/02/2021 17:58   ECHOCARDIOGRAM COMPLETE  Result Date: 10/03/2021    ECHOCARDIOGRAM REPORT   Patient Name:   RANATA LAUGHERY Date of Exam: 10/03/2021 Medical Rec #:  [id number]      Height:       69.0 in Accession #:    [id number]     Weight:       220.0 lb Date of Birth:  [birth year]       BSA:  2.151 m Patient Age:    23 years       BP:           193/79 mmHg Patient Gender: F              HR:           82 bpm. Exam Location:  Inpatient Procedure: 2D Echo Indications:    Murmur  History:        Patient has prior history of Echocardiogram examinations, most                 recent 07/25/2020. Risk Factors:Diabetes, Hypertension and                 Dyslipidemia.  Sonographer:    Arlyss Gandy Referring Phys: [id number] [place]  Sonographer Comments: Supine. IMPRESSIONS  1. Left ventricular ejection fraction, by estimation, is 65 to 70%. The left ventricle has normal function. The left ventricle has no regional wall motion abnormalities. Left ventricular diastolic parameters are consistent with Grade I diastolic dysfunction (impaired relaxation). Elevated left ventricular end-diastolic pressure.  2. Right ventricular systolic function is normal. The right ventricular size is normal. There is moderately elevated pulmonary artery systolic pressure. The estimated right  ventricular systolic pressure is 01.0 mmHg.  3. The mitral valve is normal in structure. Mild mitral valve regurgitation. No evidence of mitral stenosis.  4. The aortic valve is normal in structure. Aortic valve regurgitation is moderate. No aortic stenosis is present. Aortic regurgitation PHT measures 343 msec. Aortic valve area, by VTI measures 2.73 cm. Aortic valve mean gradient measures 6.0 mmHg. Aortic valve Vmax measures 1.66 m/s.  5. The inferior vena cava is normal in size with <50% respiratory variability, suggesting right atrial pressure of 8 mmHg. FINDINGS  Left Ventricle: Left ventricular ejection fraction, by estimation, is 65 to 70%. The left ventricle has normal function. The left ventricle has no regional wall motion abnormalities. The left ventricular internal cavity size was normal in size. There is  no left ventricular hypertrophy. Left ventricular diastolic parameters are consistent with Grade I diastolic dysfunction (impaired relaxation). Elevated left ventricular end-diastolic pressure. Right Ventricle: The right ventricular size is normal. No increase in right ventricular wall thickness. Right ventricular systolic function is normal. There is moderately elevated pulmonary artery systolic pressure. The tricuspid regurgitant velocity is 3.18 m/s, and with an assumed right atrial pressure of 8 mmHg, the estimated right ventricular systolic pressure is 93.2 mmHg. Left Atrium: Left atrial size was normal in size. Right Atrium: Right atrial size was normal in size. Pericardium: There is no evidence of pericardial effusion. Mitral Valve: The mitral valve is normal in structure. Mild mitral valve regurgitation. No evidence of mitral valve stenosis. MV peak gradient, 16.3 mmHg. The mean mitral valve gradient is 7.0 mmHg. Tricuspid Valve: The tricuspid valve is normal in structure. Tricuspid valve regurgitation is mild . No evidence of tricuspid stenosis. Aortic Valve: The aortic valve is normal in  structure. Aortic valve regurgitation is moderate. Aortic regurgitation PHT measures 343 msec. No aortic stenosis is present. Aortic valve mean gradient measures 6.0 mmHg. Aortic valve peak gradient measures 11.0 mmHg. Aortic valve area, by VTI measures 2.73 cm. Pulmonic Valve: The pulmonic valve was normal in structure. Pulmonic valve regurgitation is not visualized. No evidence of pulmonic stenosis. Aorta: The aortic root is normal in size and structure. Venous: The inferior vena cava is normal in size with less than 50% respiratory variability, suggesting right atrial pressure of  8 mmHg. IAS/Shunts: No atrial level shunt detected by color flow Doppler.  LEFT VENTRICLE PLAX 2D LVIDd:         3.70 cm   Diastology LVIDs:         2.40 cm   LV e' medial:    5.66 cm/s LV PW:         1.10 cm   LV E/e' medial:  21.7 LV IVS:        1.10 cm   LV e' lateral:   8.59 cm/s LVOT diam:     2.00 cm   LV E/e' lateral: 14.3 LV SV:         101 LV SV Index:   47 LVOT Area:     3.14 cm  RIGHT VENTRICLE            IVC RV Basal diam:  2.50 cm    IVC diam: 2.00 cm RV Mid diam:    2.30 cm RV S prime:     9.90 cm/s TAPSE (M-mode): 1.7 cm LEFT ATRIUM             Index        RIGHT ATRIUM           Index LA diam:        4.40 cm 2.05 cm/m   RA Area:     16.10 cm LA Vol (A2C):   46.7 ml 21.71 ml/m  RA Volume:   43.10 ml  20.03 ml/m LA Vol (A4C):   47.8 ml 22.22 ml/m LA Biplane Vol: 48.5 ml 22.54 ml/m  AORTIC VALVE AV Area (Vmax):    2.52 cm AV Area (Vmean):   2.49 cm AV Area (VTI):     2.73 cm AV Vmax:           166.00 cm/s AV Vmean:          118.000 cm/s AV VTI:            0.370 m AV Peak Grad:      11.0 mmHg AV Mean Grad:      6.0 mmHg LVOT Vmax:         133.00 cm/s LVOT Vmean:        93.600 cm/s LVOT VTI:          0.321 m LVOT/AV VTI ratio: 0.87 AI PHT:            343 msec  AORTA Ao Root diam: 3.10 cm Ao Asc diam:  3.10 cm MITRAL VALVE                TRICUSPID VALVE MV Area (PHT): 2.99 cm     TR Peak grad:   40.4 mmHg MV Area  VTI:   2.27 cm     TR Vmax:        318.00 cm/s MV Peak grad:  16.3 mmHg MV Mean grad:  7.0 mmHg     SHUNTS MV Vmax:       2.02 m/s     Systemic VTI:  0.32 m MV Vmean:      118.5 cm/s   Systemic Diam: 2.00 cm MV Decel Time: 254 msec MV E velocity: 123.00 cm/s MV A velocity: 198.00 cm/s MV E/A ratio:  0.62 Fransico Him MD Electronically signed by Fransico Him MD Signature Date/Time: 10/03/2021/4:20:25 PM    Final    DG Hips Bilat W or Wo Pelvis 3-4 Views  Result Date: 10/02/2021 CLINICAL DATA:  Bilateral hip pain, greater on the  left, following multiple falls over several days. EXAM: DG HIP (WITH OR WITHOUT PELVIS) 3-4V BILAT COMPARISON:  Abdomen and pelvis CT obtained without intravenous contrast on 07/04/2019 FINDINGS: Stable appearance of the hips with no fracture or dislocation seen. Lower lumbar spine degenerative changes are noted. IMPRESSION: No fracture or dislocation. Electronically Signed   By: Claudie Revering M.D.   On: 10/02/2021 17:57    Microbiology: Results for orders placed or performed during the hospital encounter of 10/02/21  Urine Culture     Status: Abnormal   Collection Time: 10/02/21  2:02 PM   Specimen: Urine, Clean Catch  Result Value Ref Range Status   Specimen Description   Final    URINE, CLEAN CATCH Performed at [facility], [place] [address], [place], [place] [address]    Special Requests   Final    NONE Performed at [facility], [place] [address] [place], [place] [address]    Culture (A)  Final    <10,000 COLONIES/mL INSIGNIFICANT GROWTH Performed at [place] [address], [place], [place] [address]    Report Status 10/04/2021 FINAL  Final  Resp Panel by RT-PCR (Flu A&B, Covid) Nasopharyngeal Swab     Status: Abnormal   Collection Time: 10/02/21 10:45 PM   Specimen: Nasopharyngeal Swab; Nasopharyngeal(NP) swabs in vial transport medium  Result Value Ref Range Status   SARS Coronavirus 2 by RT PCR POSITIVE (A)  NEGATIVE Final    Comment: (NOTE) SARS-CoV-2 target nucleic acids are DETECTED.  The SARS-CoV-2 RNA is generally detectable in upper respiratory specimens during the acute phase of infection. Positive results are indicative of the presence of the identified virus, but do not rule out bacterial infection or co-infection with other pathogens not detected by the test. Clinical correlation with patient history and other diagnostic information is necessary to determine patient infection status. The expected result is Negative.  Fact Sheet for Patients: EntrepreneurPulse.com.au  Fact Sheet for Healthcare Providers: IncredibleEmployment.be  This test is not yet approved or cleared by the [place] FDA and  has been authorized for detection and/or diagnosis of SARS-CoV-2 by FDA under an Emergency Use Authorization (EUA).  This EUA will remain in effect (meaning this test can be used) for the duration of  the COVID-19 declaration under Section 564(b)(1) of the A ct, 21 U.S.C. section 360bbb-3(b)(1), unless the authorization is terminated or revoked sooner.     Influenza A by PCR NEGATIVE NEGATIVE Final   Influenza B by PCR NEGATIVE NEGATIVE Final    Comment: (NOTE) The Xpert Xpress SARS-CoV-2/FLU/RSV plus assay is intended as an aid in the diagnosis of influenza from Nasopharyngeal swab specimens and should not be used as a sole basis for treatment. Nasal washings and aspirates are unacceptable for Xpert Xpress SARS-CoV-2/FLU/RSV testing.  Fact Sheet for Patients: EntrepreneurPulse.com.au  Fact Sheet for Healthcare Providers: IncredibleEmployment.be  This test is not yet approved or cleared by the [place] FDA and has been authorized for detection and/or diagnosis of SARS-CoV-2 by FDA under an Emergency Use Authorization (EUA). This EUA will remain in effect (meaning this test can be used) for the  duration of the COVID-19 declaration under Section 564(b)(1) of the Act, 21 U.S.C. section 360bbb-3(b)(1), unless the authorization is terminated or revoked.  Performed at [facility], [place] [address] [place], [place] [address]     Labs: CBC: Recent Labs  Lab 10/02/21 1402 10/03/21 0355 10/04/21 0535 10/05/21 0531  WBC 9.1 8.6 8.2  7.3  NEUTROABS 6.7  --  5.0 3.6  HGB 12.1 11.7* 11.5* 11.7*  HCT 36.9 36.3 35.6* 36.5  MCV 87.4 87.3 88.1 89.5  PLT 219 193 192 032   Basic Metabolic Panel: Recent Labs  Lab 10/02/21 1402 10/03/21 0355 10/04/21 0535 10/05/21 0531  NA 134* 137 136 137  K 4.9 4.6 4.1 4.3  CL 104 108 104 105  CO2 23 21* 25 27  GLUCOSE 169* 143* 165* 206*  BUN 29* 24* 23 24*  CREATININE 1.39* 1.18* 1.31* 1.23*  CALCIUM 8.8* 8.6* 8.8* 8.7*  MG  --  2.1 2.1 2.1  PHOS  --  3.2  --   --    Liver Function Tests: Recent Labs  Lab 10/02/21 1402 10/04/21 0535 10/05/21 0531  AST 17 16 15   ALT 13 13 12   ALKPHOS 74 66 65  BILITOT 0.8 0.8 0.6  PROT 7.4 6.5 6.4*  ALBUMIN 3.9 3.4* 3.3*   CBG: Recent Labs  Lab 10/04/21 1645 10/04/21 2106 10/05/21 0737 10/05/21 1149 10/05/21 1618  GLUCAP 325* 252* 168* 284* 168*    Discharge time spent: greater than 30 minutes.  Signed: Berle Mull, MD

## 2021-10-13 ENCOUNTER — Other Ambulatory Visit: Payer: Self-pay | Admitting: Endocrinology

## 2021-10-13 NOTE — Telephone Encounter (Signed)
Pts daughter is calling in stating that the pt is out of Rx insulin Lispro Prot & Lispro (HUMALOG MIX 75/25 KWIKPEN) and she state that the Rx Dulaglutide (TRULICITY) 3 MG it is on a national back order and wanted to know what she should take in place of it.  Pharm:  [facility], [place].

## 2021-10-14 ENCOUNTER — Telehealth: Payer: Self-pay

## 2021-10-14 ENCOUNTER — Other Ambulatory Visit: Payer: Self-pay | Admitting: Endocrinology

## 2021-10-14 MED ORDER — OZEMPIC (2 MG/DOSE) 8 MG/3ML ~~LOC~~ SOPN: 2.0000 mg | PEN_INJECTOR | SUBCUTANEOUS | 3 refills | Status: DC

## 2021-10-14 NOTE — Telephone Encounter (Signed)
Patient's daughter Belenda Cruise informed that alternative rx has been sent in

## 2021-10-14 NOTE — Telephone Encounter (Signed)
Dulaglutide (TRULICITY) 3 MG it is on a national back order and patients wants to know if theres an alternative she may take in the meantime?

## 2021-10-20 ENCOUNTER — Ambulatory Visit: Payer: Medicare Other | Admitting: Endocrinology

## 2021-10-29 ENCOUNTER — Ambulatory Visit (INDEPENDENT_AMBULATORY_CARE_PROVIDER_SITE_OTHER): Payer: Medicare Other | Admitting: Endocrinology

## 2021-10-29 ENCOUNTER — Other Ambulatory Visit: Payer: Self-pay

## 2021-10-29 VITALS — BP 180/78 | HR 65 | Ht 69.0 in | Wt 226.4 lb

## 2021-10-29 DIAGNOSIS — E1122 Type 2 diabetes mellitus with diabetic chronic kidney disease: Secondary | ICD-10-CM | POA: Diagnosis not present

## 2021-10-29 DIAGNOSIS — N1831 Chronic kidney disease, stage 3a: Secondary | ICD-10-CM

## 2021-10-29 DIAGNOSIS — Z794 Long term (current) use of insulin: Secondary | ICD-10-CM | POA: Diagnosis not present

## 2021-10-29 LAB — POCT GLYCOSYLATED HEMOGLOBIN (HGB A1C): Hemoglobin A1C: 6.8 % — AB (ref 4.0–5.6)

## 2021-10-29 MED ORDER — INSULIN LISPRO PROT & LISPRO (75-25 MIX) 100 UNIT/ML KWIKPEN: 40.0000 [IU] | PEN_INJECTOR | Freq: Every day | SUBCUTANEOUS | 3 refills | Status: DC

## 2021-10-29 MED ORDER — OZEMPIC (2 MG/DOSE) 8 MG/3ML ~~LOC~~ SOPN: 2.0000 mg | PEN_INJECTOR | SUBCUTANEOUS | 3 refills | Status: DC

## 2021-10-29 NOTE — Progress Notes (Signed)
Subjective:    Patient ID: [name] [name], female    DOB: [birth year], 88 y.o.   MRN: [id number]  HPI Pt returns for f/u of diabetes mellitus:  DM type: Insulin-requiring type 2 Dx'ed: 0932 Complications: PN CRI, and AN.  Therapy: insulin since 2010, and Ozempic GDM: never DKA: never [name] hypoglycemia: last episode was 2022 Pancreatitis: never.  Pancreatic imaging: normal on 2015 MRI.  SDOH: she lives with granddaughter [name] [name]) Other: She lives alone, and takes her own insulin; however, she is not a candidate for multiple daily injections, due to memory loss; She eats meals at 7-10 AM, 2 PM, and 7-8 PM.   Interval history: she brings a record of her fasting cbg's which I have reviewed today.  cbg varies from 68-220.  She has hypoglycemia approx twice per month.   Past Medical History:  Diagnosis Date   Anemia    as a teenager   Arthritis    Cancer ([place])    skin   Cholelithiasis    Chronic insomnia    Chronic low back pain    Complication of anesthesia    DDD (degenerative disc disease), lumbar    Diabetes mellitus    Iddm x 12 years   Diabetic peripheral neuropathy (HCC)    Essential tremor 04/01/2015   GERD (gastroesophageal reflux disease)    Hx of acute renal failure 02/2014   admitted to [name]   Hx: UTI (urinary tract infection) 02/2014   Hyperlipemia    Hypertension    Memory disorder 08/06/2017   Obesity    Peripheral edema    Pneumonia    as a child   PONV (postoperative nausea and vomiting)    pt states Zofran not that helpful in the past, Phenergan works best for her   Staph infection    3557'D   Umbilical hernia    Wrist fracture    left October 2014    Past Surgical History:  Procedure Laterality Date   ABDOMINAL HYSTERECTOMY     BACK SURGERY  2005   CARDIAC CATHETERIZATION  2000   Dr Einar Gip   CATARACT EXTRACTION Bilateral    w/ lens implants   COLONOSCOPY  >10 years   EYE SURGERY     HERNIA REPAIR     IR KYPHO LUMBAR INC FX  REDUCE BONE BX UNI/BIL CANNULATION INC/IMAGING  05/18/2017   IR KYPHO THORACIC WITH BONE BIOPSY  07/01/2017   IR RADIOLOGIST EVAL & MGMT  06/14/2017   IR RADIOLOGIST EVAL & MGMT  09/09/2017   LUMBAR LAMINECTOMY/DECOMPRESSION MICRODISCECTOMY Left 03/16/2014   Procedure: LUMBAR LAMINECTOMY/DECOMPRESSION MICRODISCECTOMY 1 LEVEL  lumbar level three/four;  Surgeon: [name], MD;  Location: [place];  Service: Neurosurgery;  Laterality: Left;   LUMBAR LAMINECTOMY/DECOMPRESSION MICRODISCECTOMY Right 02/06/2015   Procedure: Right L4-5 Microdiskectomy;  Surgeon: Leeroy Cha, MD;  Location: [place] [facility];  Service: Neurosurgery;  Laterality: Right;  Right L4-5 Microdiskectomy   MASS EXCISION Right 06/15/2019   Procedure: EXCISIONAL BIOPSY RIGHT BUTTOCK;  Surgeon: Leighton Ruff, MD;  Location: [place];  Service: General;  Laterality: Right;   NECK SURGERY     OOPHORECTOMY     TOTAL KNEE ARTHROPLASTY Right     Social History   Socioeconomic History   Marital status: Widowed    Spouse name: Not on file   Number of children: 3   Years of education: 76   Highest education level: Not on file  Occupational History   Occupation:  Retired   Tobacco Use   Smoking status: Never   Smokeless tobacco: Never  Vaping Use   Vaping Use: Never used  Substance and Sexual Activity   Alcohol use: No   Drug use: No   Sexual activity: Not Currently  Other Topics Concern   Not on file  Social History Narrative   Patient lives at home alone.    Patient is widowed.    Patient has 3 children and 2 step children.    Patient is right handed.    Patient has a high school education and CNA certificate.     Patient is retired.    Patient does not drink caffeine.   Social Determinants of Health   Financial Resource Strain: Not on file  Food Insecurity: Not on file  Transportation Needs: Not on file  Physical Activity: Not on file  Stress: Not on file  Social Connections: Not on file   Intimate Partner Violence: Not on file    Current Outpatient Medications on File Prior to Visit  Medication Sig Dispense Refill   acetaminophen (TYLENOL) 500 MG tablet Take 1,000 mg by mouth every 6 (six) hours as needed for headache (pain).     B-D ULTRAFINE III SHORT PEN 31G X 8 MM MISC USE TWICE A DAY AS DIRECTED 200 each 3   DULoxetine (CYMBALTA) 20 MG capsule TAKE 1 CAPSULE BY MOUTH TWICE A DAY (Patient taking differently: 20 mg 2 (two) times daily.) 180 capsule 2   furosemide (LASIX) 20 MG tablet Take 20 mg by mouth daily as needed for fluid or edema.     glucose blood test strip Use as instructed 300 each 12   meclizine (ANTIVERT) 25 MG tablet Take 1 tablet (25 mg total) by mouth 3 (three) times daily as needed for dizziness. 30 tablet 0   MICROLET LANCETS MISC Use to monitor glucose levels BID 200 each 1   mirabegron ER (MYRBETRIQ) 50 MG TB24 tablet Take 50 mg by mouth every morning.     Needles & Syringes MISC 1 Package by Does not apply route 2 (two) times daily with a meal. 100 each 6   oxybutynin (DITROPAN-XL) 10 MG 24 hr tablet Take 10 mg by mouth every morning.     propranolol (INDERAL) 10 MG tablet TAKE 1 TABLET BY MOUTH 4 TIMES DAILY. (Patient taking differently: Take 20 mg by mouth 2 (two) times daily.) 360 tablet 3   vitamin B-12 1000 MCG tablet Take 1 tablet (1,000 mcg total) by mouth daily. 30 tablet 0   No current facility-administered medications on file prior to visit.    Allergies  Allergen Reactions   Ativan [Lorazepam] Anxiety    Aggressive, irritability, anxiety, opposite effect of what ativan should have done Can tolerate valium.    Ciprofloxacin Other (See Comments)    Acute kidney failure (loss of balance reported by [facility] physicians)   Codeine Nausea And Vomiting   Dilaudid [Hydromorphone Hcl] Nausea And Vomiting and Other (See Comments)    [name] sweating   Mysoline [Primidone] Nausea Only   Oxycodone Nausea And Vomiting    Family History  Problem  Relation Age of Onset   Cancer Brother        prostate   Heart disease Brother    Alzheimer's disease Brother    Diabetes Brother    Stroke Mother    Hypertension Mother    COPD Brother    Cancer Brother        Prostate cancer  Hypertension Sister        Congestive heart failure   Diabetes Sister    Heart attack Son     BP (!) 180/78    Pulse 65    Ht 5\' 9"  (1.753 m)    Wt 226 lb 6.4 oz (102.7 kg)    SpO2 96%    BMI 33.43 kg/m    Review of Systems Denies LOC    Objective:   Physical Exam  Lab Results  Component Value Date   CREATININE 1.23 (H) 10/05/2021   BUN 24 (H) 10/05/2021   NA 137 10/05/2021   K 4.3 10/05/2021   CL 105 10/05/2021   CO2 27 10/05/2021    Lab Results  Component Value Date   HGBA1C 6.8 (A) 10/29/2021      Assessment & Plan:  Insulin-requiring type 2 DM: overcontrolled Hypoglycemia, due to insulin.   Patient Instructions  I have sent a prescription to your pharmacy, to reduce the insulin to 40 units with breakfast.  On this type of insulin schedule, you should eat meals on a regular schedule.  If a meal is missed or significantly delayed, your blood sugar could go low.   check your blood sugar twice a day.  vary the time of day when you check, between before the 3 meals, and at bedtime.  also check if you have symptoms of your blood sugar being too high or too low.  please keep a record of the readings and bring it to your next appointment here (or you can bring the meter itself).  You can write it on any piece of paper.  please call us sooner if your blood sugar goes below 70, or if you have a lot of readings over 200.  Please come back for a follow-up appointment in 2 months.

## 2021-10-29 NOTE — Patient Instructions (Addendum)
I have sent a prescription to your pharmacy, to reduce the insulin to 40 units with breakfast.  On this type of insulin schedule, you should eat meals on a regular schedule.  If a meal is missed or significantly delayed, your blood sugar could go low.   check your blood sugar twice a day.  vary the time of day when you check, between before the 3 meals, and at bedtime.  also check if you have symptoms of your blood sugar being too high or too low.  please keep a record of the readings and bring it to your next appointment here (or you can bring the meter itself).  You can write it on any piece of paper.  please call us sooner if your blood sugar goes below 70, or if you have a lot of readings over 200.  Please come back for a follow-up appointment in 2 months.

## 2021-11-06 ENCOUNTER — Other Ambulatory Visit: Payer: Self-pay | Admitting: Student

## 2021-11-06 DIAGNOSIS — I10 Essential (primary) hypertension: Secondary | ICD-10-CM

## 2021-11-13 ENCOUNTER — Ambulatory Visit: Payer: Medicare Other | Admitting: Neurology

## 2021-11-17 ENCOUNTER — Telehealth: Payer: Self-pay | Admitting: Neurology

## 2021-11-17 ENCOUNTER — Other Ambulatory Visit: Payer: Self-pay | Admitting: *Deleted

## 2021-11-17 DIAGNOSIS — G250 Essential tremor: Secondary | ICD-10-CM

## 2021-11-17 MED ORDER — PROPRANOLOL HCL 10 MG PO TABS: 20.0000 mg | ORAL_TABLET | Freq: Two times a day (BID) | ORAL | 0 refills | Status: DC

## 2021-11-17 NOTE — Telephone Encounter (Signed)
Pt's granddaughter, Osborn Coho request refill for propranolol (INDERAL) 10 MG tablet at [facility] ?[address]. [place], [place]  ?Phone: [phone] ? ?Pt scheduled appt at 6/6/ at 3:15p ? ?Sent to work in physician ?

## 2021-11-17 NOTE — Telephone Encounter (Signed)
E-scribed refill to pharmacy. 

## 2021-11-24 ENCOUNTER — Ambulatory Visit: Payer: Medicare Other | Admitting: Cardiology

## 2021-11-25 ENCOUNTER — Emergency Department (HOSPITAL_COMMUNITY): Payer: Medicare Other

## 2021-11-25 ENCOUNTER — Encounter (HOSPITAL_COMMUNITY): Payer: Self-pay

## 2021-11-25 ENCOUNTER — Other Ambulatory Visit: Payer: Self-pay

## 2021-11-25 ENCOUNTER — Emergency Department (HOSPITAL_COMMUNITY)
Admission: EM | Admit: 2021-11-25 | Discharge: 2021-11-25 | Disposition: A | Discharge status: Home / Self Care | Payer: Medicare Other | Attending: Emergency Medicine | Admitting: Emergency Medicine

## 2021-11-25 DIAGNOSIS — M5440 Lumbago with sciatica, unspecified side: Secondary | ICD-10-CM | POA: Insufficient documentation

## 2021-11-25 DIAGNOSIS — Z794 Long term (current) use of insulin: Secondary | ICD-10-CM | POA: Insufficient documentation

## 2021-11-25 DIAGNOSIS — M545 Low back pain, unspecified: Secondary | ICD-10-CM

## 2021-11-25 DIAGNOSIS — M25552 Pain in left hip: Secondary | ICD-10-CM | POA: Diagnosis not present

## 2021-11-25 DIAGNOSIS — W010XXA Fall on same level from slipping, tripping and stumbling without subsequent striking against object, initial encounter: Secondary | ICD-10-CM | POA: Diagnosis not present

## 2021-11-25 DIAGNOSIS — D649 Anemia, unspecified: Secondary | ICD-10-CM | POA: Insufficient documentation

## 2021-11-25 LAB — COMPREHENSIVE METABOLIC PANEL
ALT: 12 U/L (ref 0–44)
AST: 15 U/L (ref 15–41)
Albumin: 3.7 g/dL (ref 3.5–5.0)
Alkaline Phosphatase: 73 U/L (ref 38–126)
Anion gap: 5 (ref 5–15)
BUN: 22 mg/dL (ref 8–23)
CO2: 27 mmol/L (ref 22–32)
Calcium: 8.9 mg/dL (ref 8.9–10.3)
Chloride: 105 mmol/L (ref 98–111)
Creatinine, Ser: 0.91 mg/dL (ref 0.44–1.00)
GFR, Estimated: >60 mL/min (ref >60)
Glucose, Bld: 263 mg/dL — ABNORMAL HIGH (ref 70–99)
Potassium: 4.1 mmol/L (ref 3.5–5.1)
Sodium: 137 mmol/L (ref 135–145)
Total Bilirubin: 0.4 mg/dL (ref 0.3–1.2)
Total Protein: 7.1 g/dL (ref 6.5–8.1)

## 2021-11-25 LAB — CBC WITH DIFFERENTIAL/PLATELET
Abs Immature Granulocytes: 0.02 10*3/uL (ref 0.00–0.07)
Basophils Absolute: 0 10*3/uL (ref 0.0–0.1)
Basophils Relative: 0 %
Eosinophils Absolute: 0.1 10*3/uL (ref 0.0–0.5)
Eosinophils Relative: 2 %
HCT: 34.8 % — ABNORMAL LOW (ref 36.0–46.0)
Hemoglobin: 11.4 g/dL — ABNORMAL LOW (ref 12.0–15.0)
Immature Granulocytes: 0 %
Lymphocytes Relative: 18 %
Lymphs Abs: 1.3 10*3/uL (ref 0.7–4.0)
MCH: 28.6 pg (ref 26.0–34.0)
MCHC: 32.8 g/dL (ref 30.0–36.0)
MCV: 87.4 fL (ref 80.0–100.0)
Monocytes Absolute: 0.6 10*3/uL (ref 0.1–1.0)
Monocytes Relative: 8 %
Neutro Abs: 5.5 10*3/uL (ref 1.7–7.7)
Neutrophils Relative %: 72 %
Platelets: 217 10*3/uL (ref 150–400)
RBC: 3.98 MIL/uL (ref 3.87–5.11)
RDW: 13.3 % (ref 11.5–15.5)
WBC: 7.6 10*3/uL (ref 4.0–10.5)
nRBC: 0 % (ref 0.0–0.2)

## 2021-11-25 MED ORDER — MORPHINE SULFATE 15 MG PO TABS: 7.5000 mg | ORAL_TABLET | ORAL | 0 refills | Status: DC | PRN

## 2021-11-25 MED ORDER — ONDANSETRON 4 MG PO TBDP: ORAL_TABLET | ORAL | 0 refills | Status: DC

## 2021-11-25 MED ORDER — DICLOFENAC SODIUM 1 % EX GEL: 4.0000 g | Freq: Four times a day (QID) | CUTANEOUS | 0 refills | Status: DC

## 2021-11-25 MED ORDER — IOHEXOL 300 MG/ML  SOLN
100.0000 mL | Freq: Once | INTRAMUSCULAR | Status: AC | PRN
  Administered 2021-11-25: 100 mL via INTRAVENOUS

## 2021-11-25 NOTE — ED Notes (Signed)
Patient transported to X-ray 

## 2021-11-25 NOTE — ED Notes (Signed)
Patient verbalizes understanding of discharge instructions. Opportunity for questioning and answers were provided. Armband removed by staff, pt discharged from ED. Ambulated out to lobby  

## 2021-11-25 NOTE — ED Provider Notes (Signed)
?[facility] DEPT ?Provider Note ? ? ?CSN: [id number] ?Arrival date & time: 11/25/21  1702 ? ?  ? ?History ? ?Chief Complaint  ?Patient presents with  ? Fall  ? Hip Injury  ? ? ?[name] is a 88 y.o. female. ? ?88 yo F with a chief complaint of a fall.  The patient tripped over the wheel of her walker and fell backwards.  Complaining mostly of left hip pain.  Has trouble bearing weight.  She denies head injury denies neck pain denies back pain.  Denies abdominal discomfort.  Denies chest pain. ? ? ?Fall ? ? ?  ? ?Home Medications ?Prior to Admission medications   ?Medication Sig Start Date End Date Taking? Authorizing Provider  ?diclofenac Sodium (VOLTAREN) 1 % GEL Apply 4 g topically 4 (four) times daily. 11/25/21  Yes Deno Etienne, DO  ?morphine (MSIR) 15 MG tablet Take 0.5 tablets (7.5 mg total) by mouth every 4 (four) hours as needed for severe pain. 11/25/21  Yes Deno Etienne, DO  ?ondansetron (ZOFRAN-ODT) 4 MG disintegrating tablet '4mg'$  ODT q4 hours prn nausea/vomit 11/25/21  Yes Deno Etienne, DO  ?acetaminophen (TYLENOL) 500 MG tablet Take 1,000 mg by mouth every 6 (six) hours as needed for headache (pain).    [provider]  ?B-D ULTRAFINE III SHORT PEN 31G X 8 MM MISC USE TWICE A DAY AS DIRECTED 06/13/20   Renato Shin, MD  ?DULoxetine (CYMBALTA) 20 MG capsule TAKE 1 CAPSULE BY MOUTH TWICE A DAY ?Patient taking differently: 20 mg 2 (two) times daily. 02/14/19   Kathrynn Ducking, MD  ?furosemide (LASIX) 20 MG tablet Take 20 mg by mouth daily as needed for fluid or edema. 07/01/20   [provider]  ?glucose blood test strip Use as instructed 01/23/21   Renato Shin, MD  ?Insulin Lispro Prot & Lispro (HUMALOG MIX 75/25 KWIKPEN) (75-25) 100 UNIT/ML Kwikpen Inject 40 Units into the skin daily with breakfast. 10/29/21   Renato Shin, MD  ?meclizine (ANTIVERT) 25 MG tablet Take 1 tablet (25 mg total) by mouth 3 (three) times daily as needed for dizziness. 10/05/21   Lavina Hamman, MD  ?Georga Kaufmann MISC Use to monitor glucose levels BID 07/22/18   Renato Shin, MD  ?mirabegron ER (MYRBETRIQ) 50 MG TB24 tablet Take 50 mg by mouth every morning.    [provider]  ?Needles & Syringes MISC 1 Package by Does not apply route 2 (two) times daily with a meal. 12/07/19   Renato Shin, MD  ?oxybutynin (DITROPAN-XL) 10 MG 24 hr tablet Take 10 mg by mouth every morning. 06/06/20   [provider]  ?propranolol (INDERAL) 10 MG tablet Take 2 tablets (20 mg total) by mouth 2 (two) times daily. Must keep appt 02/10/22 for ongoing refills 11/17/21   Dohmeier, Asencion Partridge, MD  ?Semaglutide, 2 MG/DOSE, (OZEMPIC, 2 MG/DOSE,) 8 MG/3ML SOPN Inject 2 mg into the skin once a week. 10/29/21   Renato Shin, MD  ?vitamin B-12 1000 MCG tablet Take 1 tablet (1,000 mcg total) by mouth daily. 10/06/21   Lavina Hamman, MD  ?   ? ?Allergies    ?Ativan [lorazepam], Ciprofloxacin, Codeine, Dilaudid [hydromorphone hcl], Mysoline [primidone], and Oxycodone   ? ?Review of Systems   ?Review of Systems ? ?Physical Exam ?Updated Vital Signs ?BP (!) 183/78   Pulse 89   Temp 97.9 ?F (36.6 ?C) (Oral)   Resp 18   Ht '5\' 9"'$  (1.753 m)   Wt  102.5 kg   SpO2 100%   BMI 33.37 kg/m?  ?Physical Exam ?Vitals and nursing note reviewed.  ?Constitutional:   ?   General: She is not in acute distress. ?   Appearance: She is well-developed. She is not diaphoretic.  ?HENT:  ?   Head: Normocephalic and atraumatic.  ?Eyes:  ?   Pupils: Pupils are equal, round, and reactive to light.  ?Cardiovascular:  ?   Rate and Rhythm: Normal rate and regular rhythm.  ?   Heart sounds: No murmur heard. ?  No friction rub. No gallop.  ?Pulmonary:  ?   Effort: Pulmonary effort is normal.  ?   Breath sounds: No wheezing or rales.  ?Abdominal:  ?   General: There is no distension.  ?   Palpations: Abdomen is soft.  ?   Tenderness: There is no abdominal tenderness.  ?Musculoskeletal:     ?   General: Tenderness present.  ?   Cervical back:  Normal range of motion and neck supple.  ?   Comments: Pain mostly to the piriformis on the left.  No obvious pain with internal and external rotation of the left lower extremity.  Pulse motor and sensation intact.  Palpated from head to toe without any other obvious noted areas of bony tenderness.  ?Skin: ?   General: Skin is warm and dry.  ?Neurological:  ?   Mental Status: She is alert and oriented to person, place, and time.  ?Psychiatric:     ?   Behavior: Behavior normal.  ? ? ?ED Results / Procedures / Treatments   ?Labs ?(all labs ordered are listed, but only abnormal results are displayed) ?Labs Reviewed  ?CBC WITH DIFFERENTIAL/PLATELET - Abnormal; Notable for the following components:  ?    Result Value  ? Hemoglobin 11.4 (*)   ? HCT 34.8 (*)   ? All other components within normal limits  ?COMPREHENSIVE METABOLIC PANEL - Abnormal; Notable for the following components:  ? Glucose, Bld 263 (*)   ? All other components within normal limits  ? ? ?EKG ?EKG Interpretation ? ?Date/Time:  Tuesday November 25 2021 17:21:45 EDT ?Ventricular Rate:  81 ?PR Interval:  277 ?QRS Duration: 85 ?QT Interval:  376 ?QTC Calculation: 437 ?R Axis:   13 ?Text Interpretation: Sinus rhythm Prolonged PR interval Probable left atrial enlargement LVH with secondary repolarization abnormality No significant change since last tracing Confirmed by Deno Etienne [phone]) on 11/25/2021 5:40:10 PM ? ?Radiology ?DG Chest 1 View ? ?Result Date: 11/25/2021 ?CLINICAL DATA:  Fall. EXAM: CHEST  1 VIEW COMPARISON:  October 02, 2021. FINDINGS: The heart size and mediastinal contours are within normal limits. Both lungs are clear. The visualized skeletal structures are unremarkable. IMPRESSION: No active disease. Electronically Signed   By: Marijo Conception M.D.   On: 11/25/2021 18:09  ? ?DG Lumbar Spine Complete ? ?Result Date: 11/25/2021 ?CLINICAL DATA:  Fall. EXAM: LUMBAR SPINE - COMPLETE 4+ VIEW COMPARISON:  June 20, 2018. FINDINGS: Status post  kyphoplasty of T12 and L1 vertebral bodies. No fracture or spondylolisthesis is noted. Extensive osteophyte formation is noted at L1-2 and L2-3. Moderate degenerative disc disease is noted at L4-5 and L5-S1. IMPRESSION: Multilevel degenerative changes as described above. No acute abnormality seen. Electronically Signed   By: Marijo Conception M.D.   On: 11/25/2021 18:10  ? ?CT ABDOMEN PELVIS W CONTRAST ? ?Result Date: 11/25/2021 ?CLINICAL DATA:  Blunt abdominal trauma.  Trip and fall. EXAM: CT ABDOMEN AND PELVIS  WITH CONTRAST TECHNIQUE: Multidetector CT imaging of the abdomen and pelvis was performed using the standard protocol following bolus administration of intravenous contrast. RADIATION DOSE REDUCTION: This exam was performed according to the departmental dose-optimization program which includes automated exposure control, adjustment of the mA and/or kV according to patient size and/or use of iterative reconstruction technique. CONTRAST:  134m OMNIPAQUE IOHEXOL 300 MG/ML  SOLN COMPARISON:  CT 08/16/2019, additional priors reviewed . FINDINGS: Lower chest: No focal airspace disease or pneumothorax. Small left and trace right pleural effusion measuring simple fluid density. Hepatobiliary: No hepatic injury or perihepatic hematoma. Punctate granuloma in the anterior liver. Tiny low-density in the central liver adjacent to the falciform ligament. Gallstone without acute cholecystitis. No biliary dilatation. Pancreas: No evidence of injury. Parenchymal atrophy. Punctate calcification at the pancreatic head. Homogeneous enhancement. Small 8 mm cyst in the pancreatic body, unchanged from 06/01/2019 exam, sagittal series 4, image 113. No further follow-up is recommended. Spleen: No splenic injury or perisplenic hematoma. Adrenals/Urinary Tract: Normal adrenal glands. No adrenal hemorrhage. No renal injury. No hydronephrosis. No focal renal abnormality. Unremarkable urinary bladder. No bladder injury. Stomach/Bowel:  There is no evidence of bowel injury or mesenteric hematoma. Ingested material within the stomach. Normal appendix. Left colonic diverticulosis without diverticulitis. Vascular/Lymphatic: No acute vascular injury. At

## 2021-11-25 NOTE — ED Notes (Signed)
Ambulated pt with two-people assist. Pt c/o hip pain when ambulating. ?

## 2021-11-25 NOTE — Discharge Instructions (Addendum)
Your back pain is most likely due to a muscular strain.  There is been a lot of research on back pain, unfortunately the only thing that seems to really help is Tylenol and ibuprofen.  Relative rest is also important to not lift greater than 10 pounds bending or twisting at the waist.  Please follow-up with your family physician.  The other thing that really seems to benefit patients is physical therapy which your doctor may send you for.  Please return to the emergency department for new numbness or weakness to your arms or legs. Difficulty with urinating or urinating or pooping on yourself.  Also if you cannot feel toilet paper when you wipe or get a fever.   Use the gel as prescribed Also take tylenol 1000mg(2 extra strength) four times a day.   

## 2021-11-25 NOTE — ED Triage Notes (Signed)
Per EMS, Pt, from home, presents w/ LLE injury after a trip and fall this evening.  Currently, denies pain.  130mg Fentanyl given en route.  Shortening noted.  ?

## 2021-11-26 ENCOUNTER — Ambulatory Visit: Payer: Medicare Other | Admitting: Cardiology

## 2021-12-05 ENCOUNTER — Encounter: Payer: Self-pay | Admitting: Cardiology

## 2021-12-05 ENCOUNTER — Ambulatory Visit: Payer: Medicare Other | Admitting: Cardiology

## 2021-12-05 VITALS — BP 178/87 | HR 88 | Temp 97.7°F | Resp 17 | Ht 69.0 in | Wt 225.0 lb

## 2021-12-05 DIAGNOSIS — I10 Essential (primary) hypertension: Secondary | ICD-10-CM

## 2021-12-05 DIAGNOSIS — R296 Repeated falls: Secondary | ICD-10-CM

## 2021-12-05 DIAGNOSIS — I350 Nonrheumatic aortic (valve) stenosis: Secondary | ICD-10-CM

## 2021-12-05 DIAGNOSIS — I951 Orthostatic hypotension: Secondary | ICD-10-CM

## 2021-12-05 NOTE — Progress Notes (Signed)
? ?Primary Physician/Referring:  Rankins, Bill Salinas, MD ? ?Patient ID: [name], female    DOB: [birth year], 88 y.o.   MRN: [id number] ? ?Chief Complaint  ?Patient presents with  ? Loss of Consciousness  ? Hypotension  ? Hospitalization Follow-up  ? ?HPI:   ? ?[name]  is a 88 y.o. Caucasian female with history of uncontrolled type 2 insulin-dependent diabetes, mixed hyperlipidemia, orthostatic hypotension and supine hypertension.  Patient was seen in the emergency room after a fall on 11/25/2021, she is now referred back to me for evaluation of frequent fall and also orthostatic hypotension and to exclude syncope. ? ?Patient had COVID in December 2022, since then she has had more gait instability.  In the emergency room it was felt both orthostasis and peripheral neuropathy was the etiology for her frequent falls. ? ?Patient tripped over the wheel of her walker and fell backwards and had left hip pain.  Emergency room vital signs had revealed elevated blood pressure but otherwise  no other significant abnormality, she was discharged home and recommended outpatient follow-up. ? ?Her activity is limited secondary to an unsteady gait due to neuropathy.  She is accompanied by her granddaughter who she lives with presently. ?Past Medical History:  ?Diagnosis Date  ? Anemia   ? as a teenager  ? Arthritis   ? Cancer [facility])   ? skin  ? Cholelithiasis   ? Chronic insomnia   ? Chronic low back pain   ? Complication of anesthesia   ? DDD (degenerative disc disease), lumbar   ? Diabetes mellitus   ? Iddm x 12 years  ? Diabetic peripheral neuropathy ([place])   ? Essential tremor 04/01/2015  ? GERD (gastroesophageal reflux disease)   ? Hx of acute renal failure 02/2014  ? admitted to [facility]  ? Hx: UTI (urinary tract infection) 02/2014  ? Hyperlipemia   ? Hypertension   ? Memory disorder 08/06/2017  ? Obesity   ? Peripheral edema   ? Pneumonia   ? as a child  ? PONV (postoperative nausea and vomiting)   ? pt states Zofran  not that helpful in the past, Phenergan works best for her  ? Staph infection   ? 1970's  ? Umbilical hernia   ? Wrist fracture   ? left October 2014  ? ?Past Surgical History:  ?Procedure Laterality Date  ? ABDOMINAL HYSTERECTOMY    ? BACK SURGERY  2005  ? CARDIAC CATHETERIZATION  2000  ? Dr Einar Gip  ? CATARACT EXTRACTION Bilateral   ? w/ lens implants  ? COLONOSCOPY  >10 years  ? EYE SURGERY    ? HERNIA REPAIR    ? IR KYPHO LUMBAR INC FX REDUCE BONE BX UNI/BIL CANNULATION INC/IMAGING  05/18/2017  ? IR KYPHO THORACIC WITH BONE BIOPSY  07/01/2017  ? IR RADIOLOGIST EVAL & MGMT  06/14/2017  ? IR RADIOLOGIST EVAL & MGMT  09/09/2017  ? LUMBAR LAMINECTOMY/DECOMPRESSION MICRODISCECTOMY Left 03/16/2014  ? Procedure: LUMBAR LAMINECTOMY/DECOMPRESSION MICRODISCECTOMY 1 LEVEL  lumbar level three/four;  Surgeon: [name], MD;  Location: [place];  Service: Neurosurgery;  Laterality: Left;  ? LUMBAR LAMINECTOMY/DECOMPRESSION MICRODISCECTOMY Right 02/06/2015  ? Procedure: Right L4-5 Microdiskectomy;  Surgeon: Leeroy Cha, MD;  Location: [place] [facility];  Service: Neurosurgery;  Laterality: Right;  Right L4-5 Microdiskectomy  ? MASS EXCISION Right 06/15/2019  ? Procedure: EXCISIONAL BIOPSY RIGHT BUTTOCK;  Surgeon: Leighton Ruff, MD;  Location: [facility];  Service: General;  Laterality: Right;  ?  NECK SURGERY    ? OOPHORECTOMY    ? TOTAL KNEE ARTHROPLASTY Right   ? ?Family History  ?Problem Relation Age of Onset  ? Cancer Brother   ?     prostate  ? Heart disease Brother   ? Alzheimer's disease Brother   ? Diabetes Brother   ? Stroke Mother   ? Hypertension Mother   ? COPD Brother   ? Cancer Brother   ?     Prostate cancer  ? Hypertension Sister   ?     Congestive heart failure  ? Diabetes Sister   ? Heart attack Son   ?  ?Social History  ? ?Tobacco Use  ? Smoking status: Never  ? Smokeless tobacco: Never  ?Substance Use Topics  ? Alcohol use: No  ? ?Marital Status: Widowed  ? ?ROS  ?Review of Systems  ?Cardiovascular:   Positive for leg swelling (chronic and stable). Negative for chest pain and dyspnea on exertion.  ?Neurological:  Positive for dizziness and loss of balance.  ? ?Objective  ?Blood pressure (!) 178/87, pulse 88, temperature 97.7 ?F (36.5 ?C), resp. rate 17, height $RemoveBe'5\' 9"'yEIEnOHwJ$  (1.753 m), weight 225 lb (102.1 kg), SpO2 97 %.  ? ?  12/05/2021  ? 10:55 AM 11/25/2021  ?  8:30 PM 11/25/2021  ?  8:02 PM  ?Vitals with BMI  ?Height $Remove'5\' 9"'DoNONSU$     ?Weight 225 lbs    ?BMI 33.21    ?Systolic 709 628 366  ?Diastolic 87 294 78  ?Pulse 88 88 89  ?  ?Orthostatic VS for the past 72 hrs (Last 3 readings): ? Orthostatic BP Patient Position BP Location Cuff Size Orthostatic Pulse  ?12/05/21 1107 105/45 Standing Left Arm Normal 94  ?12/05/21 1106 125/64 Sitting Left Arm Normal 86  ?12/05/21 1105 156/79 Supine Left Arm Normal 82  ? ? ? Physical Exam ?Vitals reviewed.  ?Constitutional:   ?   Comments: Well-built and mildly obese  ?Cardiovascular:  ?   Rate and Rhythm: Normal rate and regular rhythm.  ?   Pulses: Intact distal pulses.     ?     Carotid pulses are 2+ on the right side and 2+ on the left side. ?     Radial pulses are 2+ on the right side and 2+ on the left side.  ?     Dorsalis pedis pulses are 1+ on the right side and 1+ on the left side.  ?     Posterior tibial pulses are 1+ on the right side and 1+ on the left side.  ?   Heart sounds: S1 normal and S2 normal. Murmur heard.  ?Early systolic murmur is present with a grade of 3/6 at the upper right sternal border and apex.  ?  No gallop.  ?Pulmonary:  ?   Effort: Pulmonary effort is normal.  ?   Breath sounds: Normal breath sounds.  ?Abdominal:  ?   General: Abdomen is flat.  ?   Palpations: Abdomen is soft.  ?Musculoskeletal:  ?   Right lower leg: Edema (2+ ankle edema, Large adipose tissue) present.  ?   Left lower leg: Edema (2+ ankle edema, Large adipose tissue) present.  ? ?Laboratory examination:  ? ?Recent Labs  ?  10/04/21 ?7654 10/05/21 ?6503 11/25/21 ?1824  ?NA 136 137 137  ?K  4.1 4.3 4.1  ?CL 104 105 105  ?CO2 $Rem'25 27 27  'HiFc$ ?GLUCOSE 165* 206* 263*  ?BUN 23 24* 22  ?CREATININE 1.31*  1.23* 0.91  ?CALCIUM 8.8* 8.7* 8.9  ?GFRNONAA 39* 43* >60  ? ?estimated creatinine clearance is 55.4 mL/min (by C-G formula based on SCr of 0.91 mg/dL).  ? ?  Latest Ref Rng & Units 11/25/2021  ?  6:24 PM 10/05/2021  ?  5:31 AM 10/04/2021  ?  5:35 AM  ?CMP  ?Glucose 70 - 99 mg/dL 263   206   165    ?BUN 8 - 23 mg/dL $Remove'22   24   23    'rUivjxH$ ?Creatinine 0.44 - 1.00 mg/dL 0.91   1.23   1.31    ?Sodium 135 - 145 mmol/L 137   137   136    ?Potassium 3.5 - 5.1 mmol/L 4.1   4.3   4.1    ?Chloride 98 - 111 mmol/L 105   105   104    ?CO2 22 - 32 mmol/L $RemoveB'27   27   25    'rHuNNhdm$ ?Calcium 8.9 - 10.3 mg/dL 8.9   8.7   8.8    ?Total Protein 6.5 - 8.1 g/dL 7.1   6.4   6.5    ?Total Bilirubin 0.3 - 1.2 mg/dL 0.4   0.6   0.8    ?Alkaline Phos 38 - 126 U/L 73   65   66    ?AST 15 - 41 U/L $Remo'15   15   16    'vYmVt$ ?ALT 0 - 44 U/L $Remo'12   12   13    'xPhji$ ? ? ?  Latest Ref Rng & Units 11/25/2021  ?  6:24 PM 10/05/2021  ?  5:31 AM 10/04/2021  ?  5:35 AM  ?CBC  ?WBC 4.0 - 10.5 K/uL 7.6   7.3   8.2    ?Hemoglobin 12.0 - 15.0 g/dL 11.4   11.7   11.5    ?Hematocrit 36.0 - 46.0 % 34.8   36.5   35.6    ?Platelets 150 - 400 K/uL 217   201   192    ? ?Lab Results  ?Component Value Date  ? CHOL 177 07/11/2020  ? HDL 54 07/11/2020  ? [place] 96 07/11/2020  ? TRIG 156 (H) 07/11/2020  ? CHOLHDL 4.0 05/16/2016  ?  ? ?HEMOGLOBIN A1C ?Lab Results  ?Component Value Date  ? HGBA1C 6.8 (A) 10/29/2021  ? MPG 142.72 10/03/2021  ? ?TSH ?Recent Labs  ?  10/03/21 ?0355  ?TSH 3.513  ? ?BNP ?   ?Component Value Date/Time  ? BNP 95.9 07/11/2020 1003  ? ?ProBNP ?   ?Component Value Date/Time  ? PROBNP 92.1 05/01/2011 2242  ? ?External labs:  ? ?06/26/2020: ?BUN 25, creatinine 1.05, EGFR 50, sodium 141, potassium 4.7, CMP otherwise normal. ? ?Medications and allergies  ? ?Allergies  ?Allergen Reactions  ? Ativan [Lorazepam] Anxiety  ?  Aggressive, irritability, anxiety, opposite effect of what ativan  should have done ?Can tolerate valium.   ? Ciprofloxacin Other (See Comments)  ?  Acute kidney failure (loss of balance reported by [facility] physicians)  ? Codeine Nausea And Vomiting  ? Dilaudid [Hydromo

## 2021-12-08 ENCOUNTER — Other Ambulatory Visit: Payer: Medicare Other

## 2021-12-29 ENCOUNTER — Other Ambulatory Visit: Payer: Self-pay | Admitting: Neurology

## 2021-12-29 ENCOUNTER — Telehealth: Payer: Self-pay | Admitting: Neurology

## 2021-12-29 DIAGNOSIS — G250 Essential tremor: Secondary | ICD-10-CM

## 2022-01-01 ENCOUNTER — Other Ambulatory Visit: Payer: Self-pay | Admitting: Neurology

## 2022-01-01 DIAGNOSIS — G250 Essential tremor: Secondary | ICD-10-CM

## 2022-01-05 ENCOUNTER — Telehealth: Payer: Self-pay | Admitting: Neurology

## 2022-01-05 ENCOUNTER — Other Ambulatory Visit: Payer: Self-pay

## 2022-01-05 DIAGNOSIS — Z794 Long term (current) use of insulin: Secondary | ICD-10-CM

## 2022-01-05 DIAGNOSIS — G250 Essential tremor: Secondary | ICD-10-CM

## 2022-01-05 MED ORDER — INSULIN LISPRO PROT & LISPRO (75-25 MIX) 100 UNIT/ML KWIKPEN: 40.0000 [IU] | PEN_INJECTOR | Freq: Every day | SUBCUTANEOUS | 3 refills | Status: DC

## 2022-01-05 MED ORDER — PROPRANOLOL HCL 10 MG PO TABS: 20.0000 mg | ORAL_TABLET | Freq: Two times a day (BID) | ORAL | 0 refills | Status: DC

## 2022-01-05 NOTE — Telephone Encounter (Signed)
Pt's granddaughter, Osborn Coho called, patient is out of medication. Need a refill for propranolol (INDERAL) 10 MG tablet . Took last Propranolol dosage this morning; do not have dosage for this evening. Would like a call from the nurse. ?

## 2022-01-05 NOTE — Telephone Encounter (Signed)
Refill sent.

## 2022-01-27 ENCOUNTER — Other Ambulatory Visit: Payer: Self-pay

## 2022-01-27 DIAGNOSIS — N1831 Chronic kidney disease, stage 3a: Secondary | ICD-10-CM

## 2022-01-27 MED ORDER — BD PEN NEEDLE SHORT U/F 31G X 8 MM MISC: 3 refills | Status: DC

## 2022-02-10 ENCOUNTER — Encounter: Payer: Self-pay | Admitting: Neurology

## 2022-02-10 ENCOUNTER — Ambulatory Visit (INDEPENDENT_AMBULATORY_CARE_PROVIDER_SITE_OTHER): Payer: Medicare Other | Admitting: Neurology

## 2022-02-10 VITALS — BP 168/72 | HR 82 | Ht 69.0 in | Wt 234.0 lb

## 2022-02-10 DIAGNOSIS — G250 Essential tremor: Secondary | ICD-10-CM | POA: Diagnosis not present

## 2022-02-10 MED ORDER — PROPRANOLOL HCL 10 MG PO TABS: 20.0000 mg | ORAL_TABLET | Freq: Two times a day (BID) | ORAL | 3 refills | Status: DC

## 2022-02-10 NOTE — Progress Notes (Signed)
Patient: [name] Date of Birth: 23-Jan-1933  Reason for Visit: Follow up History from: Patient, granddaughter, grandson Primary Neurologist: Dr. Brett Fairy  ASSESSMENT AND PLAN 88 y.o. year old female   55.  Essential tremor 2.  Diabetic neuropathy  -[name] is delightful, very pleasant, seems to be overall stable -Continue propanolol 20 mg twice daily to help control tremor -On Cymbalta for neuropathy  -Monitor BP a home, cardiology d/c medication due to orthostatic hypotension -Continue follow-up with PCP, return here on an as-needed basis  HISTORY OF PRESENT ILLNESS: Today 02/10/22 [name] is here today for follow-up. Living with her granddaughter now and grandson. Tremor is worse some days, as long as takes propranolol twice daily is controlled. Mostly in hands, worse in right hand. Handwriting is somewhat impacted. On Cymbalta, doing well, no issues with pain. Doesn't sleep great, naps during the day, watches TV at night. Needs assistance with showering, otherwise does her own ADLs. Last fall was in March 2023, since stopping bladder medication, feeling steadier. Hospitalized back in January, dehydration, COVID positive. MRI negative for acute stroke.   HISTORY  03/13/2021 Dr. Jannifer Franklin: [name] is an 88 year old right-handed white female who has a history of diabetes and a benign essential tremor.  The patient does have some discomfort in her feet associated with the diabetes, she takes low-dose duloxetine for this with some benefit.  She claimed that she does not sleep well at night but it is not because of discomfort but because her mind is racing.  The patient still has tremors, she takes 20 mg of propranolol twice daily.  She does have some difficulty feeding herself, she has difficulty with handwriting but her daughter is helping her write her checks and manage her bills.  She uses a walker for ambulation in part because of low back problems and because of arthritis  in the knees.  She has had a fall since last seen, she had to call the fire department to come and help her get up because she could not stand up on her own.  She did not sustain any injury.  She returns to the office today for further evaluation.  REVIEW OF SYSTEMS: Out of a complete 14 system review of symptoms, the patient complains only of the following symptoms, and all other reviewed systems are negative.  See HPI  ALLERGIES: Allergies  Allergen Reactions   Ativan [Lorazepam] Anxiety    Aggressive, irritability, anxiety, opposite effect of what ativan should have done Can tolerate valium.    Ciprofloxacin Other (See Comments)    Acute kidney failure (loss of balance reported by [facility] physicians)   Codeine Nausea And Vomiting   Dilaudid [Hydromorphone Hcl] Nausea And Vomiting and Other (See Comments)    [name] sweating   Mysoline [Primidone] Nausea Only   Oxycodone Nausea And Vomiting    HOME MEDICATIONS: Outpatient Medications Prior to Visit  Medication Sig Dispense Refill   acetaminophen (TYLENOL) 500 MG tablet Take 1,000 mg by mouth every 6 (six) hours as needed for headache (pain).     B-D ULTRAFINE III SHORT PEN 31G X 8 MM MISC USE TWICE A DAY AS DIRECTED 200 each 3   diclofenac Sodium (VOLTAREN) 1 % GEL Apply 4 g topically 4 (four) times daily. 100 g 0   DULoxetine (CYMBALTA) 20 MG capsule TAKE 1 CAPSULE BY MOUTH TWICE A DAY (Patient taking differently: 20 mg 2 (two) times daily.) 180 capsule 2   glucose blood test strip  Use as instructed 300 each 12   Insulin Lispro Prot & Lispro (HUMALOG MIX 75/25 KWIKPEN) (75-25) 100 UNIT/ML Kwikpen Inject 40 Units into the skin daily with breakfast. 45 mL 3   meclizine (ANTIVERT) 25 MG tablet Take 1 tablet (25 mg total) by mouth 3 (three) times daily as needed for dizziness. 30 tablet 0   melatonin 3 MG TABS tablet Take 1 tablet by mouth daily at 6 (six) AM.     MICROLET LANCETS MISC Use to monitor glucose levels BID 200 each 1    Needles & Syringes MISC 1 Package by Does not apply route 2 (two) times daily with a meal. 409 each 6   TRULICITY 3 WJ/1.9JY SOPN Inject into the skin.     vitamin B-12 1000 MCG tablet Take 1 tablet (1,000 mcg total) by mouth daily. 30 tablet 0   propranolol (INDERAL) 10 MG tablet Take 2 tablets (20 mg total) by mouth 2 (two) times daily. Must keep appt 02/10/22 for ongoing refills 180 tablet 0   mirabegron ER (MYRBETRIQ) 50 MG TB24 tablet Take 50 mg by mouth every morning.     oxybutynin (DITROPAN-XL) 10 MG 24 hr tablet Take 10 mg by mouth every morning.     No facility-administered medications prior to visit.    PAST MEDICAL HISTORY: Past Medical History:  Diagnosis Date   Anemia    as a teenager   Arthritis    Cancer ([place])    skin   Cholelithiasis    Chronic insomnia    Chronic low back pain    Complication of anesthesia    DDD (degenerative disc disease), lumbar    Diabetes mellitus    Iddm x 12 years   Diabetic peripheral neuropathy ([facility])    Essential tremor 04/01/2015   GERD (gastroesophageal reflux disease)    Hx of acute renal failure 02/2014   admitted to [facility]   Hx: UTI (urinary tract infection) 02/2014   Hyperlipemia    Hypertension    Memory disorder 08/06/2017   Obesity    Peripheral edema    Pneumonia    as a child   PONV (postoperative nausea and vomiting)    pt states Zofran not that helpful in the past, Phenergan works best for her   Staph infection    7829'F   Umbilical hernia    Wrist fracture    left October 2014    PAST SURGICAL HISTORY: Past Surgical History:  Procedure Laterality Date   ABDOMINAL HYSTERECTOMY     BACK SURGERY  2005   CARDIAC CATHETERIZATION  2000   Dr Einar Gip   CATARACT EXTRACTION Bilateral    w/ lens implants   COLONOSCOPY  >10 years   EYE SURGERY     HERNIA REPAIR     IR KYPHO LUMBAR INC FX REDUCE BONE BX UNI/BIL CANNULATION INC/IMAGING  05/18/2017   IR KYPHO THORACIC WITH BONE BIOPSY  07/01/2017   IR RADIOLOGIST  EVAL & MGMT  06/14/2017   IR RADIOLOGIST EVAL & MGMT  09/09/2017   LUMBAR LAMINECTOMY/DECOMPRESSION MICRODISCECTOMY Left 03/16/2014   Procedure: LUMBAR LAMINECTOMY/DECOMPRESSION MICRODISCECTOMY 1 LEVEL  lumbar level three/four;  Surgeon: [name], MD;  Location: [place];  Service: Neurosurgery;  Laterality: Left;   LUMBAR LAMINECTOMY/DECOMPRESSION MICRODISCECTOMY Right 02/06/2015   Procedure: Right L4-5 Microdiskectomy;  Surgeon: Leeroy Cha, MD;  Location: [place] [facility];  Service: Neurosurgery;  Laterality: Right;  Right L4-5 Microdiskectomy   MASS EXCISION Right 06/15/2019   Procedure: EXCISIONAL BIOPSY  RIGHT BUTTOCK;  Surgeon: Leighton Ruff, MD;  Location: [facility];  Service: General;  Laterality: Right;   NECK SURGERY     OOPHORECTOMY     TOTAL KNEE ARTHROPLASTY Right     FAMILY HISTORY: Family History  Problem Relation Age of Onset   Cancer Brother        prostate   Heart disease Brother    Alzheimer's disease Brother    Diabetes Brother    Stroke Mother    Hypertension Mother    COPD Brother    Cancer Brother        Prostate cancer   Hypertension Sister        Congestive heart failure   Diabetes Sister    Heart attack Son     SOCIAL HISTORY: Social History   Socioeconomic History   Marital status: Widowed    Spouse name: Not on file   Number of children: 3   Years of education: 52   Highest education level: Not on file  Occupational History   Occupation: Retired   Tobacco Use   Smoking status: Never   Smokeless tobacco: Never  Vaping Use   Vaping Use: Never used  Substance and Sexual Activity   Alcohol use: No   Drug use: No   Sexual activity: Not Currently  Other Topics Concern   Not on file  Social History Narrative   Patient lives at home alone.    Patient is widowed.    Patient has 3 children and 2 step children.    Patient is right handed.    Patient has a high school education and CNA certificate.     Patient is retired.     Patient does not drink caffeine.   Social Determinants of Health   Financial Resource Strain: Not on file  Food Insecurity: Not on file  Transportation Needs: Not on file  Physical Activity: Not on file  Stress: Not on file  Social Connections: Not on file  Intimate Partner Violence: Not on file    PHYSICAL EXAM  Vitals:   02/10/22 1513 02/10/22 1516  BP: (!) 197/86 (!) 168/72  Pulse: 81 82  Weight: 234 lb (106.1 kg)   Height: '5\' 9"'$  (1.753 m)    Body mass index is 34.56 kg/m.  Generalized: Well developed, in no acute distress  Neurological examination  Mentation: Alert oriented to time, place, history taking. Follows all commands speech and language fluent, very pleasant  Cranial nerve II-XII: Pupils were equal round reactive to light. Extraocular movements were full, visual field were full on confrontational test. Facial sensation and strength were normal.  Head turning and shoulder shrug  were normal and symmetric. Motor: Good strength all extremities Sensory: Sensory testing is intact to soft touch on all 4 extremities. No evidence of extinction is noted.  Coordination: Cerebellar testing reveals good finger-nose-finger and heel-to-shin bilaterally.  Mild tremor with finger-nose-finger bilaterally.  Tremor is translated mildly to handwriting and spiral draw. Gait and station: Has to push off to stand, gait is forward leaning, relies on a walker, wide-based Reflexes: Deep tendon reflexes are symmetric but decreased  DIAGNOSTIC DATA (LABS, IMAGING, TESTING) - I reviewed patient records, labs, notes, testing and imaging myself where available.  Lab Results  Component Value Date   WBC 7.6 11/25/2021   HGB 11.4 (L) 11/25/2021   HCT 34.8 (L) 11/25/2021   MCV 87.4 11/25/2021   PLT 217 11/25/2021      Component Value Date/Time  NA 137 11/25/2021 1824   K 4.1 11/25/2021 1824   CL 105 11/25/2021 1824   CO2 27 11/25/2021 1824   GLUCOSE 263 (H) 11/25/2021 1824   BUN 22  11/25/2021 1824   CREATININE 0.91 11/25/2021 1824   CALCIUM 8.9 11/25/2021 1824   PROT 7.1 11/25/2021 1824   ALBUMIN 3.7 11/25/2021 1824   AST 15 11/25/2021 1824   ALT 12 11/25/2021 1824   ALKPHOS 73 11/25/2021 1824   BILITOT 0.4 11/25/2021 1824   GFRNONAA >60 11/25/2021 1824   GFRAA >60 07/04/2019 0725   Lab Results  Component Value Date   CHOL 177 07/11/2020   HDL 54 07/11/2020   LDLCALC 96 07/11/2020   TRIG 156 (H) 07/11/2020   CHOLHDL 4.0 05/16/2016   Lab Results  Component Value Date   HGBA1C 6.8 (A) 10/29/2021   Lab Results  Component Value Date   VITAMINB12 154 (L) 10/03/2021   Lab Results  Component Value Date   TSH 3.513 10/03/2021    Butler Denmark, AGNP-C, DNP 02/10/2022, 3:47 PM [facility] [address], [place], [place] [address] [phone]

## 2022-02-10 NOTE — Patient Instructions (Signed)
Watch BP at home, keep a log, discuss with PCP if stays elevated Continue current medication  Return here as needed

## 2022-02-15 ENCOUNTER — Other Ambulatory Visit: Payer: Self-pay | Admitting: Neurology

## 2022-02-15 DIAGNOSIS — G250 Essential tremor: Secondary | ICD-10-CM

## 2022-02-19 ENCOUNTER — Telehealth: Payer: Self-pay | Admitting: Endocrinology

## 2022-02-19 MED ORDER — TRULICITY 3 MG/0.5ML ~~LOC~~ SOAJ: 3.0000 mg | SUBCUTANEOUS | 0 refills | Status: DC

## 2022-02-19 NOTE — Telephone Encounter (Signed)
Patient grand daughter Kathie Dike will call back to schedule a follow up with new provider

## 2022-02-19 NOTE — Telephone Encounter (Signed)
MEDICATION: Trulicity '3MG'$   PHARMACY:  [facility] [place] CONTACTED [place]? yes   IS THIS A 90 DAY SUPPLY : yes  IS PATIENT OUT OF MEDICATION: yes  IF NOT; HOW MUCH IS LEFT:   LAST APPOINTMENT DATE: '@5'$ /23/2023  NEXT APPOINTMENT DATE:'@Visit'$  date not found  DO WE HAVE YOUR PERMISSION TO LEAVE A DETAILED MESSAGE?: yes  O

## 2022-02-25 ENCOUNTER — Telehealth: Payer: Self-pay | Admitting: Neurology

## 2022-02-25 NOTE — Telephone Encounter (Signed)
Patient is no longer having to follow up with our office. Seen for tremors, orthostatic hypotension. Also, has some diabetic neuropathy. PCP will be managing medications moving forward. Her daughter is seeking placement in an assisted living facility. She is scheduling an appt with the patient's PCP for the completion of the needed FL2 form.

## 2022-02-25 NOTE — Telephone Encounter (Signed)
Pt's daughter is asking for a call to discuss request for a FL2 form to be completed for Assisted Living for memory care, she states it is needed as soon as possible.

## 2022-03-06 DIAGNOSIS — G903 Multi-system degeneration of the autonomic nervous system: Secondary | ICD-10-CM | POA: Diagnosis not present

## 2022-03-06 DIAGNOSIS — R2681 Unsteadiness on feet: Secondary | ICD-10-CM | POA: Diagnosis not present

## 2022-03-06 DIAGNOSIS — Z794 Long term (current) use of insulin: Secondary | ICD-10-CM | POA: Diagnosis not present

## 2022-03-06 DIAGNOSIS — I10 Essential (primary) hypertension: Secondary | ICD-10-CM | POA: Diagnosis not present

## 2022-03-06 DIAGNOSIS — R413 Other amnesia: Secondary | ICD-10-CM | POA: Diagnosis not present

## 2022-03-06 DIAGNOSIS — R5381 Other malaise: Secondary | ICD-10-CM | POA: Diagnosis not present

## 2022-03-06 DIAGNOSIS — G894 Chronic pain syndrome: Secondary | ICD-10-CM | POA: Diagnosis not present

## 2022-03-06 DIAGNOSIS — E1143 Type 2 diabetes mellitus with diabetic autonomic (poly)neuropathy: Secondary | ICD-10-CM | POA: Diagnosis not present

## 2022-04-13 ENCOUNTER — Encounter (HOSPITAL_COMMUNITY): Payer: Self-pay

## 2022-04-13 ENCOUNTER — Emergency Department (HOSPITAL_COMMUNITY): Payer: Medicare Other

## 2022-04-13 ENCOUNTER — Other Ambulatory Visit: Payer: Self-pay

## 2022-04-13 ENCOUNTER — Emergency Department (HOSPITAL_COMMUNITY)
Admission: EM | Admit: 2022-04-13 | Discharge: 2022-04-13 | Disposition: A | Discharge status: Home / Self Care | Payer: Medicare Other | Attending: Emergency Medicine | Admitting: Emergency Medicine

## 2022-04-13 DIAGNOSIS — Z043 Encounter for examination and observation following other accident: Secondary | ICD-10-CM | POA: Diagnosis not present

## 2022-04-13 DIAGNOSIS — I10 Essential (primary) hypertension: Secondary | ICD-10-CM | POA: Diagnosis not present

## 2022-04-13 DIAGNOSIS — S6992XA Unspecified injury of left wrist, hand and finger(s), initial encounter: Secondary | ICD-10-CM | POA: Diagnosis present

## 2022-04-13 DIAGNOSIS — S61412A Laceration without foreign body of left hand, initial encounter: Secondary | ICD-10-CM

## 2022-04-13 DIAGNOSIS — M545 Low back pain, unspecified: Secondary | ICD-10-CM | POA: Insufficient documentation

## 2022-04-13 DIAGNOSIS — E119 Type 2 diabetes mellitus without complications: Secondary | ICD-10-CM | POA: Diagnosis not present

## 2022-04-13 DIAGNOSIS — G8929 Other chronic pain: Secondary | ICD-10-CM | POA: Diagnosis not present

## 2022-04-13 DIAGNOSIS — Z743 Need for continuous supervision: Secondary | ICD-10-CM | POA: Diagnosis not present

## 2022-04-13 DIAGNOSIS — M1812 Unilateral primary osteoarthritis of first carpometacarpal joint, left hand: Secondary | ICD-10-CM | POA: Diagnosis not present

## 2022-04-13 DIAGNOSIS — W01198A Fall on same level from slipping, tripping and stumbling with subsequent striking against other object, initial encounter: Secondary | ICD-10-CM | POA: Insufficient documentation

## 2022-04-13 DIAGNOSIS — M5459 Other low back pain: Secondary | ICD-10-CM | POA: Diagnosis not present

## 2022-04-13 DIAGNOSIS — S0990XA Unspecified injury of head, initial encounter: Secondary | ICD-10-CM | POA: Diagnosis not present

## 2022-04-13 DIAGNOSIS — R6889 Other general symptoms and signs: Secondary | ICD-10-CM | POA: Diagnosis not present

## 2022-04-13 DIAGNOSIS — M549 Dorsalgia, unspecified: Secondary | ICD-10-CM | POA: Diagnosis not present

## 2022-04-13 DIAGNOSIS — W19XXXA Unspecified fall, initial encounter: Secondary | ICD-10-CM

## 2022-04-13 NOTE — ED Provider Notes (Signed)
[place] DEPT Provider Note   CSN: [id number] Arrival date & time: 04/13/22  0340     History  Chief Complaint  Patient presents with   [name] is a 88 y.o. female.  88 year old female presents to the emergency department from home via EMS.  She states that she got up to use the bathroom when she noticed she had soiled herself.  She was trying to clean herself up when she placed her hand on the toilet seat which was unstable.  This caused her to lose her balance and fell backward.  She does report striking her head, but denies loss of consciousness, headache, neck pain.  She is not chronically anticoagulated.  Complaining primarily of low back pain which is aggravated by movement.  She does have a history of 4 prior back surgeries.  No new or worsening incontinence since the fall, genital or perianal numbness, extremity numbness or paresthesias.  Further denies chest pain, shortness of breath, abdominal pain, vomiting.  The history is provided by the patient. No language interpreter was used.  Fall       Home Medications Prior to Admission medications   Medication Sig Start Date End Date Taking? Authorizing Provider  DULoxetine (CYMBALTA) 20 MG capsule TAKE 1 CAPSULE BY MOUTH TWICE A DAY Patient taking differently: 20 mg 2 (two) times daily. 02/14/19  Yes Kathrynn Ducking, MD  GEMTESA 75 MG TABS Take 75 mg by mouth daily. 03/26/22  Yes [provider]  Insulin Lispro Prot & Lispro (HUMALOG MIX 75/25 KWIKPEN) (75-25) 100 UNIT/ML Kwikpen Inject 40 Units into the skin daily with breakfast. 01/05/22  Yes Renato Shin, MD  meclizine (ANTIVERT) 25 MG tablet Take 1 tablet (25 mg total) by mouth 3 (three) times daily as needed for dizziness. 10/05/21  Yes Lavina Hamman, MD  melatonin 3 MG TABS tablet Take 3 mg by mouth daily at 6 (six) AM. 10/06/21  Yes [provider]  propranolol (INDERAL) 10 MG tablet Take 2 tablets (20 mg  total) by mouth 2 (two) times daily. 02/10/22  Yes Suzzanne Cloud, NP  TRULICITY 3 KW/4.0XB SOPN Inject 3 mg as directed once a week. Patient taking differently: Inject 3 mg as directed once a week. Sunday 02/19/22  Yes Elayne Snare, MD  vitamin B-12 1000 MCG tablet Take 1 tablet (1,000 mcg total) by mouth daily. 10/06/21  Yes Lavina Hamman, MD  acetaminophen (TYLENOL) 500 MG tablet Take 1,000 mg by mouth every 6 (six) hours as needed for headache (pain).    [provider]  B-D ULTRAFINE III SHORT PEN 31G X 8 MM MISC USE TWICE A DAY AS DIRECTED 01/27/22   Philemon Kingdom, MD  diclofenac Sodium (VOLTAREN) 1 % GEL Apply 4 g topically 4 (four) times daily. Patient not taking: Reported on 04/13/2022 11/25/21   Deno Etienne, DO  glucose blood test strip Use as instructed 01/23/21   Renato Shin, MD  MICROLET LANCETS MISC Use to monitor glucose levels BID 07/22/18   Renato Shin, MD  Needles & Syringes MISC 1 Package by Does not apply route 2 (two) times daily with a meal. 12/07/19   Renato Shin, MD      Allergies    Ativan [lorazepam], Ciprofloxacin, Codeine, Dilaudid [hydromorphone hcl], Mysoline [primidone], and Oxycodone    Review of Systems   Review of Systems Ten systems reviewed and are negative for acute change, except as noted in the HPI.  Physical Exam Updated Vital Signs BP (!) 150/83   Pulse 85   Temp 98.2 F (36.8 C) (Oral)   Resp 20   SpO2 96%   Physical Exam Vitals and nursing note reviewed.  Constitutional:      General: She is not in acute distress.    Appearance: She is well-developed. She is not diaphoretic.     Comments: Nontoxic appearing and in NAD  HENT:     Head: Normocephalic and atraumatic.     Comments: No hematoma or contusion to scalp    Right Ear: External ear normal.     Left Ear: External ear normal.     Mouth/Throat:     Mouth: Mucous membranes are moist.  Eyes:     General: No scleral icterus.    Conjunctiva/sclera: Conjunctivae normal.   Cardiovascular:     Rate and Rhythm: Normal rate and regular rhythm.     Pulses: Normal pulses.     Comments: DP pulse 2+ b/l Pulmonary:     Effort: Pulmonary effort is normal. No respiratory distress.     Comments: Respirations even and unlabored Abdominal:     Palpations: Abdomen is soft.     Comments: Soft, obese, nontender  Musculoskeletal:        General: Normal range of motion.     Cervical back: Normal range of motion. No tenderness.     Comments: TTP to lumbar midline at site of prior surgery. No bony deformities, step offs, crepitus to the thoracic or lumbosacral midline. Also no cervical midline TTP. No evidence of trauma to trunk or back.  Skin:    General: Skin is warm and dry.     Coloration: Skin is not pale.     Findings: No erythema or rash.     Comments: Skin tear to dorsum of L hand.  Neurological:     Mental Status: She is alert and oriented to person, place, and time.     Coordination: Coordination normal.     Comments: Sensation and strength intact in BLE  Psychiatric:        Behavior: Behavior normal.     ED Results / Procedures / Treatments   Labs (all labs ordered are listed, but only abnormal results are displayed) Labs Reviewed - No data to display  EKG None  Radiology DG Hand Complete Left  Result Date: 04/13/2022 CLINICAL DATA:  Unwitnessed mechanical fall. EXAM: LEFT HAND - COMPLETE 3+ VIEW COMPARISON:  None Available. FINDINGS: There is no evidence of fracture or dislocation. Generalized osteopenia. 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 IMPRESSION: No acute finding. Electronically Signed   By: Jorje Guild M.D.   On: 04/13/2022 05:43   DG Lumbar Spine Complete  Result Date: 04/13/2022 CLINICAL DATA:  Unwitnessed mechanical fall with low back pain EXAM: LUMBAR SPINE - COMPLETE 4+ VIEW COMPARISON:  11/25/2021 lumbar spine CT FINDINGS: Generalized lumbar spine degeneration with bulky levels of spondylitic spurring.  Degenerative facet spurring primarily at L3-4 and below. No evidence of fracture or aggressive bone lesion. Prior cement augmentation at T12 and L1. Right upper quadrant calcification which is calcified gallstone based on prior. IMPRESSION: Stable from prior CT.  No acute finding. Electronically Signed   By: Jorje Guild M.D.   On: 04/13/2022 05:42    Procedures Procedures    Medications Ordered in ED Medications - No data to display  ED Course/ Medical Decision Making/ A&P Clinical Course as of 04/13/22 1610  Mon Apr 13, 2022  0432 Declines tylenol [KH]  1194 X-rays reviewed without evidence of traumatic pathology [KH]  0622 Patient ambulatory with rolling walker without issue. [KH]    Clinical Course User Index [KH] Antonietta Breach, PA-C                           Medical Decision Making Amount and/or Complexity of Data Reviewed Radiology: ordered.   This patient presents to the ED for concern of fall, this involves an extensive number of treatment options, and is a complaint that carries with it a high risk of complications and morbidity.  The differential diagnosis includes sprain/strain vs contusion vs fracture vs joint dislocation   Co morbidities that complicate the patient evaluation  Chronic back pain DM HTN   Additional history obtained:  Additional history obtained from EMS External records from outside source obtained and reviewed including CT lumbar films from 11/2021   Imaging Studies ordered:  I ordered imaging studies including Xray L hand and lumbar spine  I independently visualized and interpreted imaging which showed no traumatic bony pathology I agree with the radiologist interpretation   Cardiac Monitoring:  The patient was maintained on a cardiac monitor.  I personally viewed and interpreted the cardiac monitored which showed an underlying rhythm of: NSR   Medicines ordered and prescription drug management:  I have reviewed the patients home  medicines and have made adjustments as needed   Test Considered:  CT head   Problem List / ED Course:  As above   Reevaluation:  After the interventions noted above, I reevaluated the patient and found that they have : remained stable   Social Determinants of Health:  Good social support - family at bedside Insured    Dispostion:  After consideration of the diagnostic results and the patients response to treatment, I feel that the patent would benefit from outpatient supportive care w/icing and Tylenol PRN. Encouraged PCP follow up for recheck. Return precautions discussed and provided. Patient discharged in stable condition with no unaddressed concerns.          Final Clinical Impression(s) / ED Diagnoses Final diagnoses:  Fall, initial encounter  Acute exacerbation of chronic low back pain  Skin tear of left hand without complication, initial encounter    Rx / DC Orders ED Discharge Orders     None         Antonietta Breach, PA-C 04/13/22 0631    Quintella Reichert, MD 04/18/22 [phone]

## 2022-04-13 NOTE — ED Notes (Signed)
Pt ambulated well with walker assistance.

## 2022-04-13 NOTE — ED Triage Notes (Signed)
Arrives [facility] from home for an unwitnessed mechanical fall just prior to arrival. Pt c/o low back pain.

## 2022-04-13 NOTE — Discharge Instructions (Signed)
Your imaging in the ED today was reassuring.  Alternate ice and heat to areas of injury 3-4 times per day to limit inflammation and spasm.  Avoid strenuous activity and heavy lifting.  We recommend consistent use of Tylenol, as needed, for pain.  Follow-up with a primary care doctor to ensure resolution of symptoms.  Return to the ED for any new or concerning symptoms.

## 2022-04-16 ENCOUNTER — Telehealth: Payer: Self-pay | Admitting: Internal Medicine

## 2022-04-16 DIAGNOSIS — Z794 Long term (current) use of insulin: Secondary | ICD-10-CM

## 2022-04-16 NOTE — Telephone Encounter (Signed)
MEDICATION: Trulicity 3 mg, Humalog 75-25 40 units  PH[facility]ACY:  [facility]  HAS THE PATIENT CONTACTED THEIR PHARMACY?  no  IS THIS A 90 DAY SUPPLY : unknown  IS PATIENT OUT OF MEDICATION: yes  IF NOT; HOW MUCH IS LEFT:   LAST APPOINTMENT DATE: '@6'$ /15/2023  NEXT APPOINTMENT DATE:'@9'$ /15/2023  DO WE HAVE YOUR PERMISSION TO LEAVE A DETAILED MESSAGE?:  OTHER COMMENTS:    **Let patient know to contact pharmacy at the end of the day to make sure medication is ready. **  ** Please notify patient to allow 48-72 hours to process**  **Encourage patient to contact the pharmacy for refills or they can request refills through [facility]**

## 2022-04-17 MED ORDER — INSULIN LISPRO PROT & LISPRO (75-25 MIX) 100 UNIT/ML KWIKPEN: 40.0000 [IU] | PEN_INJECTOR | Freq: Every day | SUBCUTANEOUS | 3 refills | Status: DC

## 2022-04-17 MED ORDER — TRULICITY 3 MG/0.5ML ~~LOC~~ SOAJ: 3.0000 mg | SUBCUTANEOUS | 0 refills | Status: DC

## 2022-05-22 ENCOUNTER — Ambulatory Visit: Payer: Medicare Other | Admitting: Internal Medicine

## 2022-06-08 ENCOUNTER — Other Ambulatory Visit: Payer: Self-pay | Admitting: Endocrinology

## 2022-06-08 DIAGNOSIS — N1831 Chronic kidney disease, stage 3a: Secondary | ICD-10-CM

## 2022-06-11 ENCOUNTER — Ambulatory Visit: Payer: Medicare Other | Admitting: Internal Medicine

## 2022-07-10 ENCOUNTER — Other Ambulatory Visit: Payer: Self-pay

## 2022-07-10 DIAGNOSIS — Z794 Long term (current) use of insulin: Secondary | ICD-10-CM

## 2022-07-10 DIAGNOSIS — N1831 Chronic kidney disease, stage 3a: Secondary | ICD-10-CM

## 2022-07-10 MED ORDER — TRULICITY 3 MG/0.5ML ~~LOC~~ SOAJ: SUBCUTANEOUS | 0 refills | Status: DC

## 2022-07-22 ENCOUNTER — Other Ambulatory Visit: Payer: Self-pay

## 2022-07-22 ENCOUNTER — Emergency Department (HOSPITAL_BASED_OUTPATIENT_CLINIC_OR_DEPARTMENT_OTHER)
Admission: EM | Admit: 2022-07-22 | Discharge: 2022-07-22 | Disposition: A | Discharge status: Home / Self Care | Payer: Medicare Other | Attending: Emergency Medicine | Admitting: Emergency Medicine

## 2022-07-22 ENCOUNTER — Encounter (HOSPITAL_BASED_OUTPATIENT_CLINIC_OR_DEPARTMENT_OTHER): Payer: Self-pay | Admitting: Emergency Medicine

## 2022-07-22 DIAGNOSIS — Z79899 Other long term (current) drug therapy: Secondary | ICD-10-CM | POA: Diagnosis not present

## 2022-07-22 DIAGNOSIS — M545 Low back pain, unspecified: Secondary | ICD-10-CM | POA: Diagnosis not present

## 2022-07-22 DIAGNOSIS — I10 Essential (primary) hypertension: Secondary | ICD-10-CM | POA: Insufficient documentation

## 2022-07-22 DIAGNOSIS — E119 Type 2 diabetes mellitus without complications: Secondary | ICD-10-CM | POA: Insufficient documentation

## 2022-07-22 DIAGNOSIS — M25551 Pain in right hip: Secondary | ICD-10-CM | POA: Diagnosis not present

## 2022-07-22 DIAGNOSIS — Z794 Long term (current) use of insulin: Secondary | ICD-10-CM | POA: Insufficient documentation

## 2022-07-22 NOTE — ED Triage Notes (Signed)
Right lower back pain that started after straining on the toilet tonight. Denies chest pain, shob.

## 2022-07-22 NOTE — Discharge Instructions (Signed)
Follow-up with primary doctor and return to the ER if symptoms significantly worsen or change.

## 2022-07-22 NOTE — ED Provider Notes (Signed)
[place] EMERGENCY DEPARTMENT Provider Note   CSN: [id number] Arrival date & time: 07/22/22  2231     History  Chief Complaint  Patient presents with   Back Pain    [name] is a 88 y.o. female.  Patient is an 88 year old female with past medical history of hypertension, diabetes on insulin, hyperlipidemia, UTI.  Patient presenting today with complaints of right hip pain.  She reports having a bowel movement this evening.  She was straining to move her bowels, then felt a sharp pain in her right hip.  This lasted for approximately 2 hours then resolved shortly after arriving here.  Patient feels fine is not having any further discomfort.  She denies to me she is having any abdominal pain or back pain.  She denies any bloody stool or urinary complaints.  The history is provided by the patient.       Home Medications Prior to Admission medications   Medication Sig Start Date End Date Taking? Authorizing Provider  acetaminophen (TYLENOL) 500 MG tablet Take 1,000 mg by mouth every 6 (six) hours as needed for headache (pain).    [provider]  B-D ULTRAFINE III SHORT PEN 31G X 8 MM MISC USE TWICE A DAY AS DIRECTED 01/27/22   Philemon Kingdom, MD  diclofenac Sodium (VOLTAREN) 1 % GEL Apply 4 g topically 4 (four) times daily. Patient not taking: Reported on 04/13/2022 11/25/21   Deno Etienne, DO  DULoxetine (CYMBALTA) 20 MG capsule TAKE 1 CAPSULE BY MOUTH TWICE A DAY Patient taking differently: 20 mg 2 (two) times daily. 02/14/19   Kathrynn Ducking, MD  GEMTESA 75 MG TABS Take 75 mg by mouth daily. 03/26/22   [provider]  glucose blood test strip Use as instructed 01/23/21   Renato Shin, MD  Insulin Lispro Prot & Lispro (HUMALOG MIX 75/25 KWIKPEN) (75-25) 100 UNIT/ML Kwikpen Inject 40 Units into the skin daily with breakfast. 04/17/22   Elayne Snare, MD  meclizine (ANTIVERT) 25 MG tablet Take 1 tablet (25 mg total) by mouth 3 (three) times daily as  needed for dizziness. 10/05/21   Lavina Hamman, MD  melatonin 3 MG TABS tablet Take 3 mg by mouth daily at 6 (six) AM. 10/06/21   [provider]  MICROLET LANCETS MISC Use to monitor glucose levels BID 07/22/18   Renato Shin, MD  Needles & Syringes MISC 1 Package by Does not apply route 2 (two) times daily with a meal. 12/07/19   Renato Shin, MD  propranolol (INDERAL) 10 MG tablet Take 2 tablets (20 mg total) by mouth 2 (two) times daily. 02/10/22   Suzzanne Cloud, NP  TRULICITY 3 HW/3.8UE SOPN INJECT 3 MG UNDER THE SKIN ONCE WEEKLY 07/10/22   Elayne Snare, MD  vitamin B-12 1000 MCG tablet Take 1 tablet (1,000 mcg total) by mouth daily. 10/06/21   Lavina Hamman, MD      Allergies    Ativan [lorazepam], Ciprofloxacin, Codeine, Dilaudid [hydromorphone hcl], Mysoline [primidone], and Oxycodone    Review of Systems   Review of Systems  All other systems reviewed and are negative.   Physical Exam Updated Vital Signs BP (!) 225/95 (BP Location: Right Arm)   Pulse 86   Temp 98 F (36.7 C)   Resp 20   SpO2 95%  Physical Exam Vitals and nursing note reviewed.  Constitutional:      General: She is not in acute distress.    Appearance: She is  well-developed. She is not diaphoretic.  HENT:     Head: Normocephalic and atraumatic.  Cardiovascular:     Rate and Rhythm: Normal rate and regular rhythm.     Heart sounds: No murmur heard.    No friction rub. No gallop.  Pulmonary:     Effort: Pulmonary effort is normal. No respiratory distress.     Breath sounds: Normal breath sounds. No wheezing.  Abdominal:     General: Bowel sounds are normal. There is no distension.     Palpations: Abdomen is soft.     Tenderness: There is no abdominal tenderness.  Musculoskeletal:        General: Normal range of motion.     Cervical back: Normal range of motion and neck supple.     Comments: The right hip appears grossly normal.  There is no deformity or shortening/rotation of the leg.  DP  pulses are palpable and motor and sensation are intact throughout the entire foot.  Skin:    General: Skin is warm and dry.  Neurological:     General: No focal deficit present.     Mental Status: She is alert and oriented to person, place, and time.     ED Results / Procedures / Treatments   Labs (all labs ordered are listed, but only abnormal results are displayed) Labs Reviewed - No data to display  EKG None Radiology No results found.  Procedures Procedures    Medications Ordered in ED Medications - No data to display  ED Course/ Medical Decision Making/ A&P  Patient presenting with complaints of hip pain as described in the HPI.  This started while she was having a bowel movement, hurt for approximately 2 hours, then resolved.  She arrives here with stable vital signs and physical examination is unremarkable.  She has full range of motion of the hip and her abdomen is completely benign with no tenderness.  Treatment/work-up options discussed with patient and son at bedside.  The patient tells me her pain is completely gone and wants to go home.  She suspects that she strained her hip while sitting on the toilet.  At I suspect this is the most likely scenario.  Her abdomen is benign and I doubt any intra-abdominal pathology.  Patient to be discharged with as needed return.  She was initially markedly hypertensive, however upon repeat, blood pressure is 171/116.  She tells me this is baseline for her.  Final Clinical Impression(s) / ED Diagnoses Final diagnoses:  None    Rx / DC Orders ED Discharge Orders     None         Veryl Speak, MD 07/22/22 2344

## 2022-08-03 ENCOUNTER — Other Ambulatory Visit: Payer: Self-pay | Admitting: Endocrinology

## 2022-08-03 DIAGNOSIS — N1831 Chronic kidney disease, stage 3a: Secondary | ICD-10-CM

## 2022-08-13 ENCOUNTER — Other Ambulatory Visit: Payer: Self-pay | Admitting: Neurology

## 2022-08-13 DIAGNOSIS — G250 Essential tremor: Secondary | ICD-10-CM

## 2022-08-18 ENCOUNTER — Ambulatory Visit: Payer: Medicare Other | Admitting: Internal Medicine

## 2022-08-18 DIAGNOSIS — R051 Acute cough: Secondary | ICD-10-CM | POA: Diagnosis not present

## 2022-08-18 DIAGNOSIS — J3489 Other specified disorders of nose and nasal sinuses: Secondary | ICD-10-CM | POA: Diagnosis not present

## 2022-08-18 DIAGNOSIS — R0981 Nasal congestion: Secondary | ICD-10-CM | POA: Diagnosis not present

## 2022-09-02 ENCOUNTER — Other Ambulatory Visit: Payer: Self-pay

## 2022-09-02 DIAGNOSIS — N1831 Chronic kidney disease, stage 3a: Secondary | ICD-10-CM

## 2022-09-02 MED ORDER — TRULICITY 3 MG/0.5ML ~~LOC~~ SOAJ: SUBCUTANEOUS | 1 refills | Status: DC

## 2022-10-01 ENCOUNTER — Telehealth: Payer: Self-pay | Admitting: Internal Medicine

## 2022-10-01 DIAGNOSIS — E1122 Type 2 diabetes mellitus with diabetic chronic kidney disease: Secondary | ICD-10-CM

## 2022-10-01 DIAGNOSIS — N1831 Chronic kidney disease, stage 3a: Secondary | ICD-10-CM

## 2022-10-01 MED ORDER — GLUCOSE BLOOD VI STRP: ORAL_STRIP | 0 refills | Status: DC

## 2022-10-01 NOTE — Telephone Encounter (Signed)
Rx sent 

## 2022-10-01 NOTE — Telephone Encounter (Signed)
MEDICATION: One Touch Ultra Test Strips  PHARMACY:  [facility]  HAS THE PATIENT CONTACTED THEIR PHARMACY?  NO  IS THIS A 90 DAY SUPPLY : no  IS PATIENT OUT OF MEDICATION: YES  IF NOT; HOW MUCH IS LEFT:   LAST APPOINTMENT DATE: '@12'$ /27/2023  NEXT APPOINTMENT DATE:'@2'$ /10/2022  DO WE HAVE YOUR PERMISSION TO LEAVE A DETAILED MESSAGE?:  OTHER COMMENTS:    **Let patient know to contact pharmacy at the end of the day to make sure medication is ready. **  ** Please notify patient to allow 48-72 hours to process**  **Encourage patient to contact the pharmacy for refills or they can request refills through [facility]**

## 2022-10-02 ENCOUNTER — Other Ambulatory Visit: Payer: Self-pay

## 2022-10-02 DIAGNOSIS — Z794 Long term (current) use of insulin: Secondary | ICD-10-CM

## 2022-10-02 MED ORDER — GLUCOSE BLOOD VI STRP: ORAL_STRIP | 0 refills | Status: DC

## 2022-10-09 ENCOUNTER — Encounter: Payer: Self-pay | Admitting: Internal Medicine

## 2022-10-09 ENCOUNTER — Ambulatory Visit (INDEPENDENT_AMBULATORY_CARE_PROVIDER_SITE_OTHER): Payer: 59 | Admitting: Internal Medicine

## 2022-10-09 VITALS — BP 120/74 | HR 60 | Ht 69.0 in | Wt 231.0 lb

## 2022-10-09 DIAGNOSIS — Z794 Long term (current) use of insulin: Secondary | ICD-10-CM

## 2022-10-09 DIAGNOSIS — E1122 Type 2 diabetes mellitus with diabetic chronic kidney disease: Secondary | ICD-10-CM

## 2022-10-09 DIAGNOSIS — E7849 Other hyperlipidemia: Secondary | ICD-10-CM | POA: Diagnosis not present

## 2022-10-09 DIAGNOSIS — N1831 Chronic kidney disease, stage 3a: Secondary | ICD-10-CM | POA: Diagnosis not present

## 2022-10-09 LAB — POCT GLYCOSYLATED HEMOGLOBIN (HGB A1C): Hemoglobin A1C: 8.5 % — AB (ref 4.0–5.6)

## 2022-10-09 MED ORDER — INSULIN LISPRO PROT & LISPRO (75-25 MIX) 100 UNIT/ML KWIKPEN: 58.0000 [IU] | PEN_INJECTOR | Freq: Every day | SUBCUTANEOUS | 3 refills | Status: AC

## 2022-10-09 MED ORDER — PEN NEEDLES 32G X 5 MM MISC: 3 refills | Status: AC

## 2022-10-09 MED ORDER — TRULICITY 3 MG/0.5ML ~~LOC~~ SOAJ: SUBCUTANEOUS | 1 refills | Status: DC

## 2022-10-09 NOTE — Patient Instructions (Addendum)
Please continue: - Trulicity 3 mg weekly  Change: - Humalog 75/25 40 units 10 min before and add 18 units before dinner.  Please check some sugars at bedtime or 2h after dinner.  Please return for another visit in 3-4 months.   PATIENT INSTRUCTIONS FOR TYPE 2 DIABETES:  **Please join MyChart!** - see attached instructions about how to join if you have not done so already.  DIET AND EXERCISE Diet and exercise is an important part of diabetic treatment.  We recommended aerobic exercise in the form of brisk walking (working between 40-60% of maximal aerobic capacity, similar to brisk walking) for 150 minutes per week (such as 30 minutes five days per week) along with 3 times per week performing 'resistance' training (using various gauge rubber tubes with handles) 5-10 exercises involving the major muscle groups (upper body, lower body and core) performing 10-15 repetitions (or near fatigue) each exercise. Start at half the above goal but build slowly to reach the above goals. If limited by weight, joint pain, or disability, we recommend daily walking in a swimming pool with water up to waist to reduce pressure from joints while allow for adequate exercise.    BLOOD GLUCOSES Monitoring your blood glucoses is important for continued management of your diabetes. Please check your blood glucoses 2-4 times a day: fasting, before meals and at bedtime (you can rotate these measurements - e.g. one day check before the 3 meals, the next day check before 2 of the meals and before bedtime, etc.).   HYPOGLYCEMIA (low blood sugar) Hypoglycemia is usually a reaction to not eating, exercising, or taking too much insulin/ other diabetes drugs.  Symptoms include tremors, sweating, hunger, confusion, headache, etc. Treat IMMEDIATELY with 15 grams of Carbs: 4 glucose tablets  cup regular juice/soda 2 tablespoons raisins 4 teaspoons sugar 1 tablespoon honey Recheck blood glucose in 15 mins and repeat above  if still symptomatic/blood glucose <100.  RECOMMENDATIONS TO REDUCE YOUR RISK OF DIABETIC COMPLICATIONS: * Take your prescribed MEDICATION(S) * Follow a DIABETIC diet: Complex carbs, fiber rich foods, (monounsaturated and polyunsaturated) fats * AVOID saturated/trans fats, high fat foods, >2,300 mg salt per day. * EXERCISE at least 5 times a week for 30 minutes or preferably daily.  * DO NOT SMOKE OR DRINK more than 1 drink a day. * Check your FEET every day. Do not wear tightfitting shoes. Contact us if you develop an ulcer * See your EYE doctor once a year or more if needed * Get a FLU shot once a year * Get a PNEUMONIA vaccine once before and once after age 8 years  GOALS:  * Your Hemoglobin A1c of <7%  * fasting sugars need to be 80-130 * after meals sugars need to be <180 (2h after you start eating) * Your Systolic BP should be 109 or lower  * Your Diastolic BP should be 80 or lower  * Your HDL (Good Cholesterol) should be 40 or higher  * Your LDL (Bad Cholesterol) should be ideally <70. * Your Triglycerides should be 150 or lower  * Your Urine microalbumin (kidney function) should be <30 * Your Body Mass Index should be 25 or lower   Please consider the following ways to cut down carbs and fat and increase fiber and micronutrients in your diet: - substitute whole grain for white bread or pasta - substitute brown rice for white rice - substitute 90-calorie flat bread pieces for slices of bread when possible - substitute sweet potatoes or  yams for white potatoes - substitute humus for margarine - substitute tofu for cheese when possible - substitute almond or rice milk for regular milk (would not drink soy milk daily due to concern for soy estrogen influence on breast cancer risk) - substitute dark chocolate for other sweets when possible - substitute water - can add lemon or orange slices for taste - for diet sodas (artificial sweeteners will trick your body that you can eat  sweets without getting calories and will lead you to overeating and weight gain in the long run) - do not skip breakfast or other meals (this will slow down the metabolism and will result in more weight gain over time)  - can try smoothies made from fruit and almond/rice milk in am instead of regular breakfast - can also try old-fashioned (not instant) oatmeal made with almond/rice milk in am - order the dressing on the side when eating salad at a restaurant (pour less than half of the dressing on the salad) - eat as little meat as possible - can try juicing, but should not forget that juicing will get rid of the fiber, so would alternate with eating raw veg./fruits or drinking smoothies - use as little oil as possible, even when using olive oil - can dress a salad with a mix of balsamic vinegar and lemon juice, for e.g. - use agave nectar, stevia sugar, or regular sugar rather than artificial sweateners - steam or broil/roast veggies  - snack on veggies/fruit/nuts (unsalted, preferably) when possible, rather than processed foods - reduce or eliminate aspartame in diet (it is in diet sodas, chewing gum, etc) Read the labels!  Try to read Dr. Janene Harvey book: "Program for Reversing Diabetes" for other ideas for healthy eating.

## 2022-10-09 NOTE — Progress Notes (Signed)
Patient ID: [name], female   DOB: [birth year], 89 y.o.   MRN: [id number]  HPI: [name] is a 89 y.o.-year-old female, returning for follow-up for DM2, dx in 2007, insulin-dependent since 2010, uncontrolled, with complications (CKD, PN). Pt. previously saw Dr. Loanne Drilling, last visit 11 months ago.  She is here with her daughter who offers most of the history, especially related to her diabetes history, blood sugars, medications, and symptoms.  Reviewed HbA1c: Lab Results  Component Value Date   HGBA1C 6.8 (A) 10/29/2021   HGBA1C 6.6 (H) 10/03/2021   HGBA1C 7.9 (A) 01/23/2021   HGBA1C 9.6 (A) 03/07/2020   HGBA1C 9.9 (A) 12/18/2019   HGBA1C 10.6 (A) 02/13/2019   HGBA1C 9.4 (A) 10/26/2018   HGBA1C 7.2 (A) 08/22/2018   HGBA1C 9.3 (A) 05/27/2018   HGBA1C 8.2 (H) 01/05/2017   Pt is on a regimen of: - Trulicity 3 mg weekly - Humalog 75/25 40 units right before breakfast Per Dr. Cordelia Pen last note, she was not a candidate for multiple daily insulin injections due to memory loss.  Pt checks her sugars 1x a day and they are: - am: 90, 155-170, 300s - 2h after b'fast: n/c - before lunch: n/c - 2h after lunch: 140-180 - before dinner: n/c - 2h after dinner: n/c - bedtime: n/c - nighttime: n/c Lowest sugar was 30s >> 90; she has hypoglycemia awareness at 70.  Glycemic episode was in 2022 (30s). Highest sugar was 300s.  Glucometer: One Touch Ultra  Pt's meals are: - Breakfast: sausage McMuffin ([employer]), yoghurt, fruit cup - Lunch: chicken + fries or chicken sandwich - Dinner: chicken, sandwich - Snacks: cookies  - +h/o CKD, last BUN/creatinine:  Lab Results  Component Value Date   BUN 22 11/25/2021   BUN 24 (H) 10/05/2021   CREATININE 0.91 11/25/2021   CREATININE 1.23 (H) 10/05/2021  She is not on an ACE inhibitor/ARB.  -+ HL; last set of lipids: Lab Results  Component Value Date   CHOL 177 07/11/2020   HDL 54 07/11/2020   LDLCALC 96 07/11/2020   TRIG 156 (H)  07/11/2020   CHOLHDL 4.0 05/16/2016  She is not on a statin.  - last eye exam was in "couple years". No DR reportedly.   - no numbness and tingling in her feet.  Last foot exam was in 2022.  She also has a history of HTN, anemia, cholecystitis, essential tremor, B12 deficiency, DDD, memory loss.  ROS: + see HPI No increased urination, blurry vision, nausea, chest pain.  Past Medical History:  Diagnosis Date   Anemia    as a teenager   Arthritis    Cancer ([place])    skin   Cholelithiasis    Chronic insomnia    Chronic low back pain    Complication of anesthesia    DDD (degenerative disc disease), lumbar    Diabetes mellitus    Iddm x 12 years   Diabetic peripheral neuropathy ([place])    Essential tremor 04/01/2015   GERD (gastroesophageal reflux disease)    Hx of acute renal failure 02/2014   admitted to [facility]   Hx: UTI (urinary tract infection) 02/2014   Hyperlipemia    Hypertension    Memory disorder 08/06/2017   Obesity    Peripheral edema    Pneumonia    as a child   PONV (postoperative nausea and vomiting)    pt states Zofran not that helpful in the past, Phenergan works best for her  Staph infection    8413'K   Umbilical hernia    Wrist fracture    left October 2014   Past Surgical History:  Procedure Laterality Date   ABDOMINAL HYSTERECTOMY     BACK SURGERY  2005   CARDIAC CATHETERIZATION  2000   Dr Einar Gip   CATARACT EXTRACTION Bilateral    w/ lens implants   COLONOSCOPY  >10 years   EYE SURGERY     HERNIA REPAIR     IR KYPHO LUMBAR INC FX REDUCE BONE BX UNI/BIL CANNULATION INC/IMAGING  05/18/2017   IR KYPHO THORACIC WITH BONE BIOPSY  07/01/2017   IR RADIOLOGIST EVAL & MGMT  06/14/2017   IR RADIOLOGIST EVAL & MGMT  09/09/2017   LUMBAR LAMINECTOMY/DECOMPRESSION MICRODISCECTOMY Left 03/16/2014   Procedure: LUMBAR LAMINECTOMY/DECOMPRESSION MICRODISCECTOMY 1 LEVEL  lumbar level three/four;  Surgeon: [name], MD;  Location: [place];  Service:  Neurosurgery;  Laterality: Left;   LUMBAR LAMINECTOMY/DECOMPRESSION MICRODISCECTOMY Right 02/06/2015   Procedure: Right L4-5 Microdiskectomy;  Surgeon: Leeroy Cha, MD;  Location: [place] [facility];  Service: Neurosurgery;  Laterality: Right;  Right L4-5 Microdiskectomy   MASS EXCISION Right 06/15/2019   Procedure: EXCISIONAL BIOPSY RIGHT BUTTOCK;  Surgeon: Leighton Ruff, MD;  Location: [place];  Service: General;  Laterality: Right;   NECK SURGERY     OOPHORECTOMY     TOTAL KNEE ARTHROPLASTY Right    Social History   Socioeconomic History   Marital status: Widowed    Spouse name: Not on file   Number of children: 3   Years of education: 70   Highest education level: Not on file  Occupational History   Occupation: Retired   Tobacco Use   Smoking status: Never   Smokeless tobacco: Never  Vaping Use   Vaping Use: Never used  Substance and Sexual Activity   Alcohol use: No   Drug use: No   Sexual activity: Not Currently  Other Topics Concern   Not on file  Social History Narrative   Patient lives at home alone.    Patient is widowed.    Patient has 3 children and 2 step children.    Patient is right handed.    Patient has a high school education and CNA certificate.     Patient is retired.    Patient does not drink caffeine.   Social Determinants of Health   Financial Resource Strain: Not on file  Food Insecurity: Not on file  Transportation Needs: Not on file  Physical Activity: Not on file  Stress: Not on file  Social Connections: Not on file  Intimate Partner Violence: Not on file   Current Outpatient Medications on File Prior to Visit  Medication Sig Dispense Refill   acetaminophen (TYLENOL) 500 MG tablet Take 1,000 mg by mouth every 6 (six) hours as needed for headache (pain).     B-D ULTRAFINE III SHORT PEN 31G X 8 MM MISC USE TWICE A DAY AS DIRECTED 200 each 3   diclofenac Sodium (VOLTAREN) 1 % GEL Apply 4 g topically 4 (four) times daily.  (Patient not taking: Reported on 04/13/2022) 100 g 0   DULoxetine (CYMBALTA) 20 MG capsule TAKE 1 CAPSULE BY MOUTH TWICE A DAY (Patient taking differently: 20 mg 2 (two) times daily.) 180 capsule 2   GEMTESA 75 MG TABS Take 75 mg by mouth daily.     glucose blood test strip Check blood sugar 1x daily 25 each 0   Insulin Lispro Prot &  Lispro (HUMALOG MIX 75/25 KWIKPEN) (75-25) 100 UNIT/ML Kwikpen Inject 40 Units into the skin daily with breakfast. 45 mL 3   meclizine (ANTIVERT) 25 MG tablet Take 1 tablet (25 mg total) by mouth 3 (three) times daily as needed for dizziness. 30 tablet 0   melatonin 3 MG TABS tablet Take 3 mg by mouth daily at 6 (six) AM.     MICROLET LANCETS MISC Use to monitor glucose levels BID 200 each 1   Needles & Syringes MISC 1 Package by Does not apply route 2 (two) times daily with a meal. 100 each 6   propranolol (INDERAL) 10 MG tablet TAKE 2 TABLETS BY MOUTH TWICE A DAY 128 tablet 6   TRULICITY 3 NO/6.7EH SOPN INJECT 3 MG UNDER THE SKIN ONCE WEEKLY 2 mL 1   vitamin B-12 1000 MCG tablet Take 1 tablet (1,000 mcg total) by mouth daily. 30 tablet 0   No current facility-administered medications on file prior to visit.   Allergies  Allergen Reactions   Ativan [Lorazepam] Anxiety    Aggressive, irritability, anxiety, opposite effect of what ativan should have done Can tolerate valium.    Ciprofloxacin Other (See Comments)    Acute kidney failure (loss of balance reported by [facility] physicians)   Codeine Nausea And Vomiting   Dilaudid [Hydromorphone Hcl] Nausea And Vomiting and Other (See Comments)    Severe sweating   Mysoline [Primidone] Nausea Only   Oxycodone Nausea And Vomiting   Family History  Problem Relation Age of Onset   Cancer Brother        prostate   Heart disease Brother    Alzheimer's disease Brother    Diabetes Brother    Stroke Mother    Hypertension Mother    COPD Brother    Cancer Brother        Prostate cancer   Hypertension Sister         Congestive heart failure   Diabetes Sister    Heart attack Son     PE: BP 120/74 (BP Location: Right Arm, Patient Position: Sitting, Cuff Size: Large)   Pulse 60   Ht '5\' 9"'$  (1.753 m)   Wt 231 lb (104.8 kg)   SpO2 96%   BMI 34.11 kg/m  Wt Readings from Last 3 Encounters:  10/09/22 231 lb (104.8 kg)  02/10/22 234 lb (106.1 kg)  12/05/21 225 lb (102.1 kg)   Constitutional: overweight, in NAD; in wheelchair Eyes:  EOMI, no exophthalmos ENT: no neck masses, no cervical lymphadenopathy Cardiovascular: RRR, No MRG, + significant bilateral pitting LE edema Respiratory: CTA B Musculoskeletal: no deformities Skin:no rashes Neurological: no tremor with outstretched hands  ASSESSMENT: 1. DM2, insulin-dependent, uncontrolled, with complications - CKD stage III - PN - Hypoglycemia-last episode 2022  2. HL  PLAN:  1. Patient with long-standing, uncontrolled diabetes, on injectable antidiabetic regimen with premixed insulin and GLP-1 receptor agonist, with good control.  Latest HbA1c available from review is from a year ago, at 6.8%.  At today's visit, HbA1c is 8.5% (higher). -At today's visit, blood sugars appear to be quite fluctuating in the morning and they rarely check later in the day.  Whenever checked after lunch, the sugars appear to be at goal, likely due to taking her insulin in the morning.  She is taking this at the start of the breakfast and I advised her to move the insulin dose approximately 10-15 minutes before the meal.  We also discussed that the premixed insulin cannot stay in  her system for more than 12 hours so I suggested to add a lower dose before dinner, since sugars in the morning are elevated.  We also discussed about trying not to eat in the middle of the night, as she is currently doing, including cookies and other sweet snacks. -We can continue Trulicity for now, which she tolerates well. -We also discussed about the need to improve diet.  They refused a referral  to nutrition for now, but I gave him several references.  She has a low fiber diet and I strongly advised her to increase the intake of veggies.  Also, to eliminate some of the bread that she is eating.  If she continues to eat the bread, to switch to whole grain/whole-wheat. -I also advised him to try to check some sugars at bedtime or 2 hours after dinner.  We discussed why this is important  and how this would influence her insulin dosing.  They agreed to do so. - I suggested to:  Patient Instructions  Please continue: - Trulicity 3 mg weekly  Change: - Humalog 75/25 40 units 10 min before and add 18 units before dinner.  Please check some sugars at bedtime or 2h after dinner.  Please return for another visit in 3-4 months.   - check sugars at different times of the day - check 1-2x a day, rotating checks - discussed about CBG targets for treatment: 80-130 mg/dL before meals and <180 mg/dL after meals; target HbA1c <7%. - given foot care handout  - given instructions for hypoglycemia management "15-15 rule"  - advised for yearly eye exams  - foo exam performed todayt  - Return to clinic in 3-4 months  2. HL - Reviewed latest lipid panel from 2021: LDL above our target 78, triglycerides slightly high: Lab Results  Component Value Date   CHOL 177 07/11/2020   HDL 54 07/11/2020   LDLCALC 96 07/11/2020   TRIG 156 (H) 07/11/2020   CHOLHDL 4.0 05/16/2016  - she is not on a statin - plan to repeat the Lipid panel at next OV  - Total time spent for the visit: 40 min, in precharting, post charting, reviewing Dr. Cordelia Pen last note, obtaining medical information from the chart and from the pt. and her daughter, reviewing her  previous labs, evaluations, and treatments, reviewing her symptoms, counseling her about her diabetes (please see the discussed topics above), and developing a plan to further treat it.  Philemon Kingdom, MD PhD [facility] Endocrinology

## 2022-10-11 ENCOUNTER — Emergency Department (HOSPITAL_COMMUNITY)
Admission: EM | Admit: 2022-10-11 | Discharge: 2022-10-12 | Disposition: A | Discharge status: Home / Self Care | Payer: 59 | Attending: Emergency Medicine | Admitting: Emergency Medicine

## 2022-10-11 DIAGNOSIS — R509 Fever, unspecified: Secondary | ICD-10-CM | POA: Diagnosis not present

## 2022-10-11 DIAGNOSIS — Z96651 Presence of right artificial knee joint: Secondary | ICD-10-CM | POA: Insufficient documentation

## 2022-10-11 DIAGNOSIS — R58 Hemorrhage, not elsewhere classified: Secondary | ICD-10-CM | POA: Diagnosis not present

## 2022-10-11 DIAGNOSIS — R42 Dizziness and giddiness: Secondary | ICD-10-CM | POA: Insufficient documentation

## 2022-10-11 DIAGNOSIS — Z794 Long term (current) use of insulin: Secondary | ICD-10-CM | POA: Insufficient documentation

## 2022-10-11 DIAGNOSIS — N189 Chronic kidney disease, unspecified: Secondary | ICD-10-CM | POA: Insufficient documentation

## 2022-10-11 DIAGNOSIS — Z85828 Personal history of other malignant neoplasm of skin: Secondary | ICD-10-CM | POA: Diagnosis not present

## 2022-10-11 DIAGNOSIS — E1122 Type 2 diabetes mellitus with diabetic chronic kidney disease: Secondary | ICD-10-CM | POA: Diagnosis not present

## 2022-10-11 DIAGNOSIS — Y92009 Unspecified place in unspecified non-institutional (private) residence as the place of occurrence of the external cause: Secondary | ICD-10-CM

## 2022-10-11 DIAGNOSIS — N390 Urinary tract infection, site not specified: Secondary | ICD-10-CM | POA: Diagnosis not present

## 2022-10-11 DIAGNOSIS — I129 Hypertensive chronic kidney disease with stage 1 through stage 4 chronic kidney disease, or unspecified chronic kidney disease: Secondary | ICD-10-CM | POA: Insufficient documentation

## 2022-10-11 DIAGNOSIS — S81812A Laceration without foreign body, left lower leg, initial encounter: Secondary | ICD-10-CM | POA: Insufficient documentation

## 2022-10-11 DIAGNOSIS — Z743 Need for continuous supervision: Secondary | ICD-10-CM | POA: Diagnosis not present

## 2022-10-11 DIAGNOSIS — S31821A Laceration without foreign body of left buttock, initial encounter: Secondary | ICD-10-CM | POA: Insufficient documentation

## 2022-10-11 DIAGNOSIS — W1839XA Other fall on same level, initial encounter: Secondary | ICD-10-CM | POA: Diagnosis not present

## 2022-10-11 DIAGNOSIS — R404 Transient alteration of awareness: Secondary | ICD-10-CM | POA: Diagnosis not present

## 2022-10-11 DIAGNOSIS — Y92002 Bathroom of unspecified non-institutional (private) residence single-family (private) house as the place of occurrence of the external cause: Secondary | ICD-10-CM | POA: Insufficient documentation

## 2022-10-11 DIAGNOSIS — S8992XA Unspecified injury of left lower leg, initial encounter: Secondary | ICD-10-CM | POA: Diagnosis present

## 2022-10-11 DIAGNOSIS — W19XXXA Unspecified fall, initial encounter: Secondary | ICD-10-CM | POA: Diagnosis not present

## 2022-10-11 HISTORY — DX: Chronic kidney disease, unspecified: N18.9

## 2022-10-11 NOTE — ED Triage Notes (Signed)
Pt denies LOC

## 2022-10-11 NOTE — ED Provider Notes (Signed)
[place] DEPT Provider Note: Georgena Spurling, MD, FACEP  CSN: [id number] MRN: [id number] ARRIVAL: 10/11/22 at [place]: [place] OF PRESENT ILLNESS  10/11/22 11:14 PM [name] is a 89 y.o. female who was getting up to go to the bathroom just prior to arrival.  She lost her balance reaching for her walker and fell.  She has a small skin tear to her left lower leg.  She believes she was lightheaded before she fell but she did not lose consciousness.  She denies chest pain.  She has shortness of breath on exertion but she states this is typical for her.  Blood sugar was 223.   Past Medical History:  Diagnosis Date   Anemia    as a teenager   Arthritis    Cancer ([place])    skin   Cholelithiasis    Chronic insomnia    Chronic low back pain    Complication of anesthesia    DDD (degenerative disc disease), lumbar    Diabetes mellitus    Iddm x 12 years   Diabetic peripheral neuropathy ([facility])    Essential tremor 04/01/2015   GERD (gastroesophageal reflux disease)    Hx of acute renal failure 02/2014   admitted to [facility]   Hx: UTI (urinary tract infection) 02/2014   Hyperlipemia    Hypertension    Memory disorder 08/06/2017   Obesity    Peripheral edema    Pneumonia    as a child   PONV (postoperative nausea and vomiting)    pt states Zofran not that helpful in the past, Phenergan works best for her   Staph infection    0932'I   Umbilical hernia    Wrist fracture    left October 2014    Past Surgical History:  Procedure Laterality Date   ABDOMINAL HYSTERECTOMY     BACK SURGERY  2005   CARDIAC CATHETERIZATION  2000   Dr Einar Gip   CATARACT EXTRACTION Bilateral    w/ lens implants   COLONOSCOPY  >10 years   EYE SURGERY     HERNIA REPAIR     IR KYPHO LUMBAR INC FX REDUCE BONE BX UNI/BIL CANNULATION INC/IMAGING  05/18/2017   IR KYPHO THORACIC WITH BONE BIOPSY  07/01/2017   IR RADIOLOGIST EVAL & MGMT  06/14/2017   IR  RADIOLOGIST EVAL & MGMT  09/09/2017   LUMBAR LAMINECTOMY/DECOMPRESSION MICRODISCECTOMY Left 03/16/2014   Procedure: LUMBAR LAMINECTOMY/DECOMPRESSION MICRODISCECTOMY 1 LEVEL  lumbar level three/four;  Surgeon: [name], MD;  Location: [place];  Service: Neurosurgery;  Laterality: Left;   LUMBAR LAMINECTOMY/DECOMPRESSION MICRODISCECTOMY Right 02/06/2015   Procedure: Right L4-5 Microdiskectomy;  Surgeon: Leeroy Cha, MD;  Location: [place] NEURO ORS;  Service: Neurosurgery;  Laterality: Right;  Right L4-5 Microdiskectomy   MASS EXCISION Right 06/15/2019   Procedure: EXCISIONAL BIOPSY RIGHT BUTTOCK;  Surgeon: Leighton Ruff, MD;  Location: [place];  Service: General;  Laterality: Right;   NECK SURGERY     OOPHORECTOMY     TOTAL KNEE ARTHROPLASTY Right     Family History  Problem Relation Age of Onset   Cancer Brother        prostate   Heart disease Brother    Alzheimer's disease Brother    Diabetes Brother    Stroke Mother    Hypertension Mother    COPD Brother    Cancer Brother        Prostate cancer  Hypertension Sister        Congestive heart failure   Diabetes Sister    Heart attack Son     Social History   Tobacco Use   Smoking status: Never   Smokeless tobacco: Never  Vaping Use   Vaping Use: Never used  Substance Use Topics   Alcohol use: No   Drug use: No    Prior to Admission medications   Medication Sig Start Date End Date Taking? Authorizing Provider  acetaminophen (TYLENOL) 500 MG tablet Take 1,000 mg by mouth every 6 (six) hours as needed for headache (pain).    [provider]  diclofenac Sodium (VOLTAREN) 1 % GEL Apply 4 g topically 4 (four) times daily. Patient not taking: Reported on 10/09/2022 11/25/21   Deno Etienne, DO  DULoxetine (CYMBALTA) 20 MG capsule TAKE 1 CAPSULE BY MOUTH TWICE A DAY Patient taking differently: 20 mg 2 (two) times daily. 02/14/19   Kathrynn Ducking, MD  GEMTESA 75 MG TABS Take 75 mg by mouth daily. 03/26/22    [provider]  glucose blood test strip Check blood sugar 1x daily 10/02/22   Philemon Kingdom, MD  Insulin Lispro Prot & Lispro (HUMALOG MIX 75/25 KWIKPEN) (75-25) 100 UNIT/ML Kwikpen Inject 58-60 Units into the skin daily with breakfast. 10/09/22   Philemon Kingdom, MD  Insulin Pen Needle (PEN NEEDLES) 32G X 5 MM MISC Use 2x a day 10/09/22   Philemon Kingdom, MD  meclizine (ANTIVERT) 25 MG tablet Take 1 tablet (25 mg total) by mouth 3 (three) times daily as needed for dizziness. 10/05/21   Lavina Hamman, MD  melatonin 3 MG TABS tablet Take 3 mg by mouth daily at 6 (six) AM. 10/06/21   [provider]  MICROLET LANCETS MISC Use to monitor glucose levels BID 07/22/18   Renato Shin, MD  Needles & Syringes MISC 1 Package by Does not apply route 2 (two) times daily with a meal. 12/07/19   Renato Shin, MD  propranolol (INDERAL) 10 MG tablet TAKE 2 TABLETS BY MOUTH TWICE A DAY 08/18/22   Suzzanne Cloud, NP  TRULICITY 3 BV/6.9IH SOPN INJECT 3 MG UNDER THE SKIN ONCE WEEKLY 10/09/22   Philemon Kingdom, MD  vitamin B-12 1000 MCG tablet Take 1 tablet (1,000 mcg total) by mouth daily. 10/06/21   Lavina Hamman, MD    Allergies Ativan [lorazepam], Ciprofloxacin, Codeine, Dilaudid [hydromorphone hcl], Mysoline [primidone], and Oxycodone   REVIEW OF SYSTEMS  Negative except as noted here or in the History of Present Illness.   PHYSICAL EXAMINATION  Initial Vital Signs Blood pressure 134/63, pulse 89, temperature 98.4 F (36.9 C), temperature source Oral, resp. rate 18, height '5\' 9"'$  (1.753 m), weight 99.8 kg, SpO2 97 %.  Examination General: Well-developed, well-nourished female in no acute distress; appearance consistent with age of record HENT: normocephalic; atraumatic Eyes: pupils equal, round and reactive to light; extraocular muscles intact Neck: supple Heart: regular rate and rhythm Lungs: clear to auscultation bilaterally Abdomen: soft; nondistended; nontender; bowel  sounds present Extremities: No deformity; full range of motion; edema of lower legs Neurologic: Awake, alert and oriented; motor function intact in all extremities and symmetric; no facial droop; mild tremor Skin: Warm and dry; skin tear left lower leg:    Subacute appearing hematoma of medial buttocks with skin tear of medial left buttock:    Psychiatric: Normal mood and affect   RESULTS  Summary of this visit's results, reviewed and interpreted by myself:  EKG Interpretation  Date/Time:    Ventricular Rate:    PR Interval:    QRS Duration:   QT Interval:    QTC Calculation:   R Axis:     Text Interpretation:         Laboratory Studies: No results found for this or any previous visit (from the past 24 hour(s)). Imaging Studies: No results found.  ED COURSE and MDM  Nursing notes, initial and subsequent vitals signs, including pulse oximetry, reviewed and interpreted by myself.  Vitals:   10/11/22 2308 10/11/22 2309  BP:  134/63  Pulse:  89  Resp:  18  Temp:  98.4 F (36.9 C)  TempSrc:  Oral  SpO2:  97%  Weight: 99.8 kg   Height: '5\' 9"'$  (1.753 m)    Medications - No data to display    PROCEDURES  Procedures   ED DIAGNOSES  No diagnosis found.

## 2022-10-11 NOTE — ED Triage Notes (Signed)
Pt BIB EMS. Pt reports using bathroom getting dizzy and falling because of lost balance. Skin tear in sacral region. Pt c/o SOB. Pt sats 98 upon ems arrival.   Pt hx diabetes, orthostatic HTN, denies blood thinner.  110HR Cbg 223 118/82 BP  20RR labored

## 2022-10-12 ENCOUNTER — Encounter (HOSPITAL_COMMUNITY): Payer: Self-pay | Admitting: Emergency Medicine

## 2022-10-12 LAB — CBC WITH DIFFERENTIAL/PLATELET
Abs Immature Granulocytes: 0.04 10*3/uL (ref 0.00–0.07)
Basophils Absolute: 0.1 10*3/uL (ref 0.0–0.1)
Basophils Relative: 1 %
Eosinophils Absolute: 0.2 10*3/uL (ref 0.0–0.5)
Eosinophils Relative: 2 %
HCT: 40.6 % (ref 36.0–46.0)
Hemoglobin: 13.2 g/dL (ref 12.0–15.0)
Immature Granulocytes: 0 %
Lymphocytes Relative: 18 %
Lymphs Abs: 1.9 10*3/uL (ref 0.7–4.0)
MCH: 28.4 pg (ref 26.0–34.0)
MCHC: 32.5 g/dL (ref 30.0–36.0)
MCV: 87.5 fL (ref 80.0–100.0)
Monocytes Absolute: 0.8 10*3/uL (ref 0.1–1.0)
Monocytes Relative: 8 %
Neutro Abs: 7.2 10*3/uL (ref 1.7–7.7)
Neutrophils Relative %: 71 %
Platelets: 206 10*3/uL (ref 150–400)
RBC: 4.64 MIL/uL (ref 3.87–5.11)
RDW: 13.7 % (ref 11.5–15.5)
WBC: 10.2 10*3/uL (ref 4.0–10.5)
nRBC: 0 % (ref 0.0–0.2)

## 2022-10-12 LAB — BASIC METABOLIC PANEL
Anion gap: 8 (ref 5–15)
BUN: 32 mg/dL — ABNORMAL HIGH (ref 8–23)
CO2: 20 mmol/L — ABNORMAL LOW (ref 22–32)
Calcium: 8.8 mg/dL — ABNORMAL LOW (ref 8.9–10.3)
Chloride: 108 mmol/L (ref 98–111)
Creatinine, Ser: 1.54 mg/dL — ABNORMAL HIGH (ref 0.44–1.00)
GFR, Estimated: 32 mL/min — ABNORMAL LOW (ref >60)
Glucose, Bld: 214 mg/dL — ABNORMAL HIGH (ref 70–99)
Potassium: 4.4 mmol/L (ref 3.5–5.1)
Sodium: 136 mmol/L (ref 135–145)

## 2022-10-12 LAB — URINALYSIS, ROUTINE W REFLEX MICROSCOPIC
Bilirubin Urine: NEGATIVE
Glucose, UA: >=500 mg/dL — AB
Ketones, ur: NEGATIVE mg/dL
Nitrite: POSITIVE — AB
Protein, ur: 100 mg/dL — AB
Specific Gravity, Urine: 1.014 (ref 1.005–1.030)
WBC, UA: >50 WBC/hpf (ref 0–5)
pH: 5 (ref 5.0–8.0)

## 2022-10-12 LAB — CBG MONITORING, ED: Glucose-Capillary: 183 mg/dL — ABNORMAL HIGH (ref 70–99)

## 2022-10-12 MED ORDER — CEPHALEXIN 500 MG PO CAPS: 500.0000 mg | ORAL_CAPSULE | Freq: Two times a day (BID) | ORAL | 0 refills | Status: AC

## 2022-10-12 MED ORDER — SODIUM CHLORIDE 0.9 % IV SOLN
1.0000 g | Freq: Once | INTRAVENOUS | Status: AC
  Administered 2022-10-12: 1 g via INTRAVENOUS
  Filled 2022-10-12: qty 10

## 2022-10-12 MED ORDER — SODIUM CHLORIDE 0.9 % IV BOLUS
500.0000 mL | Freq: Once | INTRAVENOUS | Status: AC
  Administered 2022-10-12: 500 mL via INTRAVENOUS

## 2022-10-12 MED ORDER — INSULIN ASPART 100 UNIT/ML IJ SOLN
5.0000 [IU] | Freq: Once | INTRAMUSCULAR | Status: AC
  Administered 2022-10-12: 5 [IU] via SUBCUTANEOUS
  Filled 2022-10-12: qty 0.05

## 2022-10-12 NOTE — ED Notes (Signed)
Pt wounds cleaned and dressed on gluteal fold and left lower extremity

## 2022-10-14 LAB — URINE CULTURE: Culture: >=100000 — AB

## 2022-10-15 ENCOUNTER — Telehealth (HOSPITAL_BASED_OUTPATIENT_CLINIC_OR_DEPARTMENT_OTHER): Payer: Self-pay

## 2022-10-15 NOTE — Telephone Encounter (Signed)
Post ED Visit - Positive Culture Follow-up  Culture report reviewed by antimicrobial stewardship pharmacist: [place] Team '[]'$  Elenor Quinones, Pharm.D. '[x]'$  Heide Guile, Pharm.D., BCPS AQ-ID '[]'$  Parks Neptune, Pharm.D., BCPS '[]'$  Alycia Rossetti, Pharm.D., BCPS '[]'$  [place], Pharm.D., BCPS, AAHIVP '[]'$  Legrand Como, Pharm.D., BCPS, AAHIVP '[]'$  Salome Arnt, PharmD, BCPS '[]'$  Johnnette Gourd, PharmD, BCPS '[]'$  Hughes Better, PharmD, BCPS '[]'$  Leeroy Cha, PharmD '[]'$  Laqueta Linden, PharmD, BCPS '[]'$  Albertina Parr, PharmD  [place] Team '[]'$  Leodis Sias, PharmD '[]'$  Lindell Spar, PharmD '[]'$  Royetta Asal, PharmD '[]'$  Graylin Shiver, Rph '[]'$  Rema Fendt) Glennon Mac, PharmD '[]'$  Arlyn Dunning, PharmD '[]'$  Netta Cedars, PharmD '[]'$  Dia Sitter, PharmD '[]'$  Leone Haven, PharmD '[]'$  Gretta Arab, PharmD '[]'$  Theodis Shove, PharmD '[]'$  Peggyann Juba, PharmD '[]'$  Reuel Boom, PharmD   Positive urine culture Treated with Cephalexin, organism sensitive to the same and no further patient follow-up is required at this time.  Glennon Hamilton 10/15/2022, 10:08 AM

## 2022-10-26 ENCOUNTER — Other Ambulatory Visit: Payer: Self-pay | Admitting: Internal Medicine

## 2022-10-26 DIAGNOSIS — E1122 Type 2 diabetes mellitus with diabetic chronic kidney disease: Secondary | ICD-10-CM

## 2022-11-12 DIAGNOSIS — Z0000 Encounter for general adult medical examination without abnormal findings: Secondary | ICD-10-CM | POA: Diagnosis not present

## 2022-11-12 DIAGNOSIS — G3184 Mild cognitive impairment, so stated: Secondary | ICD-10-CM | POA: Diagnosis not present

## 2022-11-12 DIAGNOSIS — Z9181 History of falling: Secondary | ICD-10-CM | POA: Diagnosis not present

## 2022-11-12 DIAGNOSIS — Z23 Encounter for immunization: Secondary | ICD-10-CM | POA: Diagnosis not present

## 2022-11-12 DIAGNOSIS — Z794 Long term (current) use of insulin: Secondary | ICD-10-CM | POA: Diagnosis not present

## 2022-11-12 DIAGNOSIS — N1832 Chronic kidney disease, stage 3b: Secondary | ICD-10-CM | POA: Diagnosis not present

## 2022-11-12 DIAGNOSIS — G903 Multi-system degeneration of the autonomic nervous system: Secondary | ICD-10-CM | POA: Diagnosis not present

## 2022-11-12 DIAGNOSIS — E1122 Type 2 diabetes mellitus with diabetic chronic kidney disease: Secondary | ICD-10-CM | POA: Diagnosis not present

## 2022-11-12 DIAGNOSIS — E1142 Type 2 diabetes mellitus with diabetic polyneuropathy: Secondary | ICD-10-CM | POA: Diagnosis not present

## 2022-11-12 DIAGNOSIS — G250 Essential tremor: Secondary | ICD-10-CM | POA: Diagnosis not present

## 2022-11-14 ENCOUNTER — Other Ambulatory Visit: Payer: Self-pay | Admitting: Internal Medicine

## 2022-11-14 DIAGNOSIS — N1831 Chronic kidney disease, stage 3a: Secondary | ICD-10-CM

## 2023-02-12 ENCOUNTER — Other Ambulatory Visit: Payer: Self-pay | Admitting: Internal Medicine

## 2023-02-12 DIAGNOSIS — Z794 Long term (current) use of insulin: Secondary | ICD-10-CM

## 2023-03-01 ENCOUNTER — Ambulatory Visit: Payer: 59 | Admitting: Internal Medicine

## 2023-03-03 DIAGNOSIS — Z993 Dependence on wheelchair: Secondary | ICD-10-CM | POA: Diagnosis not present

## 2023-03-03 DIAGNOSIS — E1143 Type 2 diabetes mellitus with diabetic autonomic (poly)neuropathy: Secondary | ICD-10-CM | POA: Diagnosis not present

## 2023-03-03 DIAGNOSIS — E785 Hyperlipidemia, unspecified: Secondary | ICD-10-CM | POA: Diagnosis not present

## 2023-03-03 DIAGNOSIS — R413 Other amnesia: Secondary | ICD-10-CM | POA: Diagnosis not present

## 2023-03-03 DIAGNOSIS — Z794 Long term (current) use of insulin: Secondary | ICD-10-CM | POA: Diagnosis not present

## 2023-03-03 DIAGNOSIS — Z0289 Encounter for other administrative examinations: Secondary | ICD-10-CM | POA: Diagnosis not present

## 2023-03-18 DIAGNOSIS — I872 Venous insufficiency (chronic) (peripheral): Secondary | ICD-10-CM | POA: Diagnosis not present

## 2023-04-02 DIAGNOSIS — Z7985 Long-term (current) use of injectable non-insulin antidiabetic drugs: Secondary | ICD-10-CM | POA: Diagnosis not present

## 2023-04-02 DIAGNOSIS — G4700 Insomnia, unspecified: Secondary | ICD-10-CM | POA: Diagnosis not present

## 2023-04-02 DIAGNOSIS — G250 Essential tremor: Secondary | ICD-10-CM | POA: Diagnosis not present

## 2023-04-02 DIAGNOSIS — E1122 Type 2 diabetes mellitus with diabetic chronic kidney disease: Secondary | ICD-10-CM | POA: Diagnosis not present

## 2023-04-02 DIAGNOSIS — N3281 Overactive bladder: Secondary | ICD-10-CM | POA: Diagnosis not present

## 2023-04-02 DIAGNOSIS — G3184 Mild cognitive impairment, so stated: Secondary | ICD-10-CM | POA: Diagnosis not present

## 2023-04-02 DIAGNOSIS — Z794 Long term (current) use of insulin: Secondary | ICD-10-CM | POA: Diagnosis not present

## 2023-04-02 DIAGNOSIS — N189 Chronic kidney disease, unspecified: Secondary | ICD-10-CM | POA: Diagnosis not present

## 2023-04-02 DIAGNOSIS — I129 Hypertensive chronic kidney disease with stage 1 through stage 4 chronic kidney disease, or unspecified chronic kidney disease: Secondary | ICD-10-CM | POA: Diagnosis not present

## 2023-04-02 DIAGNOSIS — E538 Deficiency of other specified B group vitamins: Secondary | ICD-10-CM | POA: Diagnosis not present

## 2023-04-02 DIAGNOSIS — E785 Hyperlipidemia, unspecified: Secondary | ICD-10-CM | POA: Diagnosis not present

## 2023-04-06 DIAGNOSIS — E119 Type 2 diabetes mellitus without complications: Secondary | ICD-10-CM | POA: Diagnosis not present

## 2023-04-06 DIAGNOSIS — I10 Essential (primary) hypertension: Secondary | ICD-10-CM | POA: Diagnosis not present

## 2023-04-15 DIAGNOSIS — E1142 Type 2 diabetes mellitus with diabetic polyneuropathy: Secondary | ICD-10-CM | POA: Diagnosis not present

## 2023-04-15 DIAGNOSIS — M6281 Muscle weakness (generalized): Secondary | ICD-10-CM | POA: Diagnosis not present

## 2023-04-16 ENCOUNTER — Other Ambulatory Visit: Payer: Self-pay | Admitting: Internal Medicine

## 2023-04-16 DIAGNOSIS — N1831 Chronic kidney disease, stage 3a: Secondary | ICD-10-CM

## 2023-04-21 DIAGNOSIS — G250 Essential tremor: Secondary | ICD-10-CM | POA: Diagnosis not present

## 2023-04-21 DIAGNOSIS — E538 Deficiency of other specified B group vitamins: Secondary | ICD-10-CM | POA: Diagnosis not present

## 2023-04-21 DIAGNOSIS — Z794 Long term (current) use of insulin: Secondary | ICD-10-CM | POA: Diagnosis not present

## 2023-04-21 DIAGNOSIS — Z7985 Long-term (current) use of injectable non-insulin antidiabetic drugs: Secondary | ICD-10-CM | POA: Diagnosis not present

## 2023-04-21 DIAGNOSIS — E1122 Type 2 diabetes mellitus with diabetic chronic kidney disease: Secondary | ICD-10-CM | POA: Diagnosis not present

## 2023-04-21 DIAGNOSIS — E785 Hyperlipidemia, unspecified: Secondary | ICD-10-CM | POA: Diagnosis not present

## 2023-04-21 DIAGNOSIS — I10 Essential (primary) hypertension: Secondary | ICD-10-CM | POA: Diagnosis not present

## 2023-04-21 DIAGNOSIS — N183 Chronic kidney disease, stage 3 unspecified: Secondary | ICD-10-CM | POA: Diagnosis not present

## 2023-04-21 DIAGNOSIS — G3184 Mild cognitive impairment, so stated: Secondary | ICD-10-CM | POA: Diagnosis not present

## 2023-04-28 DIAGNOSIS — L89153 Pressure ulcer of sacral region, stage 3: Secondary | ICD-10-CM | POA: Diagnosis not present

## 2023-05-04 DIAGNOSIS — L89153 Pressure ulcer of sacral region, stage 3: Secondary | ICD-10-CM | POA: Diagnosis not present

## 2023-05-10 DIAGNOSIS — I82492 Acute embolism and thrombosis of other specified deep vein of left lower extremity: Secondary | ICD-10-CM | POA: Diagnosis not present

## 2023-05-10 DIAGNOSIS — M79605 Pain in left leg: Secondary | ICD-10-CM | POA: Diagnosis not present

## 2023-05-11 DIAGNOSIS — L97929 Non-pressure chronic ulcer of unspecified part of left lower leg with unspecified severity: Secondary | ICD-10-CM | POA: Diagnosis not present

## 2023-05-11 DIAGNOSIS — N189 Chronic kidney disease, unspecified: Secondary | ICD-10-CM | POA: Diagnosis not present

## 2023-05-11 DIAGNOSIS — Z794 Long term (current) use of insulin: Secondary | ICD-10-CM | POA: Diagnosis not present

## 2023-05-11 DIAGNOSIS — E1122 Type 2 diabetes mellitus with diabetic chronic kidney disease: Secondary | ICD-10-CM | POA: Diagnosis not present

## 2023-05-11 DIAGNOSIS — Z7985 Long-term (current) use of injectable non-insulin antidiabetic drugs: Secondary | ICD-10-CM | POA: Diagnosis not present

## 2023-05-11 DIAGNOSIS — I87312 Chronic venous hypertension (idiopathic) with ulcer of left lower extremity: Secondary | ICD-10-CM | POA: Diagnosis not present

## 2023-05-25 DIAGNOSIS — I878 Other specified disorders of veins: Secondary | ICD-10-CM | POA: Diagnosis not present

## 2023-05-25 DIAGNOSIS — L89153 Pressure ulcer of sacral region, stage 3: Secondary | ICD-10-CM | POA: Diagnosis not present

## 2023-05-25 DIAGNOSIS — Z794 Long term (current) use of insulin: Secondary | ICD-10-CM | POA: Diagnosis not present

## 2023-05-25 DIAGNOSIS — N3281 Overactive bladder: Secondary | ICD-10-CM | POA: Diagnosis not present

## 2023-05-25 DIAGNOSIS — N183 Chronic kidney disease, stage 3 unspecified: Secondary | ICD-10-CM | POA: Diagnosis not present

## 2023-05-25 DIAGNOSIS — G3184 Mild cognitive impairment, so stated: Secondary | ICD-10-CM | POA: Diagnosis not present

## 2023-05-25 DIAGNOSIS — G250 Essential tremor: Secondary | ICD-10-CM | POA: Diagnosis not present

## 2023-05-25 DIAGNOSIS — E1122 Type 2 diabetes mellitus with diabetic chronic kidney disease: Secondary | ICD-10-CM | POA: Diagnosis not present

## 2023-05-25 DIAGNOSIS — Z7985 Long-term (current) use of injectable non-insulin antidiabetic drugs: Secondary | ICD-10-CM | POA: Diagnosis not present

## 2023-05-25 DIAGNOSIS — I129 Hypertensive chronic kidney disease with stage 1 through stage 4 chronic kidney disease, or unspecified chronic kidney disease: Secondary | ICD-10-CM | POA: Diagnosis not present

## 2023-05-26 DIAGNOSIS — I129 Hypertensive chronic kidney disease with stage 1 through stage 4 chronic kidney disease, or unspecified chronic kidney disease: Secondary | ICD-10-CM | POA: Diagnosis not present

## 2023-05-26 DIAGNOSIS — N183 Chronic kidney disease, stage 3 unspecified: Secondary | ICD-10-CM | POA: Diagnosis not present

## 2023-05-26 DIAGNOSIS — S91201A Unspecified open wound of right great toe with damage to nail, initial encounter: Secondary | ICD-10-CM | POA: Diagnosis not present

## 2023-06-08 ENCOUNTER — Ambulatory Visit: Payer: 59 | Admitting: Internal Medicine

## 2023-06-08 NOTE — Progress Notes (Deleted)
Patient ID: [name], female   DOB: Jun 14, 1933, 89 y.o.   MRN: [id number]  HPI: [name] is a 89 y.o.-year-old female, returning for follow-up for DM2, dx in 2007, insulin-dependent since 2010, uncontrolled, with complications (CKD, PN). Pt. previously saw [name], last visit 11 months ago.  She is here with her daughter who offers most of the history, especially related to her diabetes history, blood sugars, medications, and symptoms.  Reviewed HbA1c: Lab Results  Component Value Date   HGBA1C 8.5 (A) 10/09/2022   HGBA1C 6.8 (A) 10/29/2021   HGBA1C 6.6 (H) 10/03/2021   HGBA1C 7.9 (A) 01/23/2021   HGBA1C 9.6 (A) 03/07/2020   HGBA1C 9.9 (A) 12/18/2019   HGBA1C 10.6 (A) 02/13/2019   HGBA1C 9.4 (A) 10/26/2018   HGBA1C 7.2 (A) 08/22/2018   HGBA1C 9.3 (A) 05/27/2018   Pt is on a regimen of: - Trulicity 3 mg weekly - Humalog 75/25 40 units right before breakfast Per [name] last note, she was not a candidate for multiple daily insulin injections due to memory loss.  Pt checks her sugars 1x a day and they are: - am: 90, 155-170, 300s - 2h after b'fast: n/c - before lunch: n/c - 2h after lunch: 140-180 - before dinner: n/c - 2h after dinner: n/c - bedtime: n/c - nighttime: n/c Lowest sugar was 30s >> 90; she has hypoglycemia awareness at 70.  Glycemic episode was in 2022 (30s). Highest sugar was 300s.  Glucometer: One Touch Ultra  Pt's meals are: - Breakfast: sausage McMuffin ([employer]), yoghurt, fruit cup - Lunch: chicken + fries or chicken sandwich - Dinner: chicken, sandwich - Snacks: cookies  - +h/o CKD, last BUN/creatinine:     Lab Results  Component Value Date   BUN 32 (H) 10/11/2022   BUN 22 11/25/2021   CREATININE 1.54 (H) 10/11/2022   CREATININE 0.91 11/25/2021  No results found for: "MICRALBCREAT" She is not on an ACE inhibitor/ARB.  -+ HL; last set of lipids: Lab Results  Component Value Date   CHOL 177 07/11/2020   HDL 54 07/11/2020    LDLCALC 96 07/11/2020   TRIG 156 (H) 07/11/2020   CHOLHDL 4.0 05/16/2016  She is not on a statin.  - last eye exam was in last "couple of years". No DR reportedly.   - no numbness and tingling in her feet.  Last foot exam was 10/09/2022.  She also has a history of HTN, anemia, cholecystitis, essential tremor, B12 deficiency, DDD, memory loss.  ROS: + see HPI No increased urination, blurry vision, nausea, chest pain.  Past Medical History:  Diagnosis Date   Anemia    as a teenager   Arthritis    Cancer (HCC)    skin   Cholelithiasis    Chronic insomnia    Chronic kidney disease    Chronic low back pain    Complication of anesthesia    DDD (degenerative disc disease), lumbar    Diabetes mellitus    Iddm x 12 years   Diabetic peripheral neuropathy (HCC)    Essential tremor 04/01/2015   GERD (gastroesophageal reflux disease)    Hx of acute renal failure 02/2014   admitted to [facility]   Hx: UTI (urinary tract infection) 02/2014   Hyperlipemia    Hypertension    Memory disorder 08/06/2017   Obesity    Peripheral edema    Pneumonia    as a child   PONV (postoperative nausea and vomiting)  pt states Zofran not that helpful in the past, Phenergan works best for her   Staph infection    1970's   Umbilical hernia    Wrist fracture    left October 2014   Past Surgical History:  Procedure Laterality Date   ABDOMINAL HYSTERECTOMY     BACK SURGERY  2005   CARDIAC CATHETERIZATION  2000   Dr Jacinto Halim   CATARACT EXTRACTION Bilateral    w/ lens implants   COLONOSCOPY  >10 years   EYE SURGERY     HERNIA REPAIR     IR KYPHO LUMBAR INC FX REDUCE BONE BX UNI/BIL CANNULATION INC/IMAGING  05/18/2017   IR KYPHO THORACIC WITH BONE BIOPSY  07/01/2017   IR RADIOLOGIST EVAL & MGMT  06/14/2017   IR RADIOLOGIST EVAL & MGMT  09/09/2017   LUMBAR LAMINECTOMY/DECOMPRESSION MICRODISCECTOMY Left 03/16/2014   Procedure: LUMBAR LAMINECTOMY/DECOMPRESSION MICRODISCECTOMY 1 LEVEL  lumbar level  three/four;  Surgeon: Karn Cassis, MD;  Location: [facility] OR;  Service: Neurosurgery;  Laterality: Left;   LUMBAR LAMINECTOMY/DECOMPRESSION MICRODISCECTOMY Right 02/06/2015   Procedure: Right L4-5 Microdiskectomy;  Surgeon: Hilda Lias, MD;  Location: [facility];  Service: Neurosurgery;  Laterality: Right;  Right L4-5 Microdiskectomy   MASS EXCISION Right 06/15/2019   Procedure: EXCISIONAL BIOPSY RIGHT BUTTOCK;  Surgeon: Romie Levee, MD;  Location: [facility] [place];  Service: General;  Laterality: Right;   NECK SURGERY     OOPHORECTOMY     TOTAL KNEE ARTHROPLASTY Right    Social History   Socioeconomic History   Marital status: Widowed    Spouse name: Not on file   Number of children: 3   Years of education: 32   Highest education level: Not on file  Occupational History   Occupation: Retired   Tobacco Use   Smoking status: Never   Smokeless tobacco: Never  Vaping Use   Vaping status: Never Used  Substance and Sexual Activity   Alcohol use: No   Drug use: No   Sexual activity: Not Currently  Other Topics Concern   Not on file  Social History Narrative   Patient lives at home alone.    Patient is widowed.    Patient has 3 children and 2 step children.    Patient is right handed.    Patient has a high school education and CNA certificate.     Patient is retired.    Patient does not drink caffeine.   Social Determinants of Health   Financial Resource Strain: Not on file  Food Insecurity: Not on file  Transportation Needs: Not on file  Physical Activity: Not on file  Stress: Not on file  Social Connections: Not on file  Intimate Partner Violence: Not on file   Current Outpatient Medications on File Prior to Visit  Medication Sig Dispense Refill   acetaminophen (TYLENOL) 500 MG tablet Take 1,000 mg by mouth every 6 (six) hours as needed for headache (pain).     DULoxetine (CYMBALTA) 20 MG capsule TAKE 1 CAPSULE BY MOUTH TWICE A DAY (Patient taking  differently: 20 mg 2 (two) times daily.) 180 capsule 2   GEMTESA 75 MG TABS Take 75 mg by mouth daily.     glucose blood (ONETOUCH ULTRA) test strip USE 1 STRIP TO TEST DAILY 50 strip 10   Insulin Lispro Prot & Lispro (HUMALOG MIX 75/25 KWIKPEN) (75-25) 100 UNIT/ML Kwikpen Inject 58-60 Units into the skin daily with breakfast. 45 mL 3   Insulin Pen Needle (B-D  ULTRAFINE III SHORT PEN) 31G X 8 MM MISC USE TWO TIMES A DAY AS DIRECTED 100 each 3   Insulin Pen Needle (PEN NEEDLES) 32G X 5 MM MISC Use 2x a day 200 each 3   meclizine (ANTIVERT) 25 MG tablet Take 1 tablet (25 mg total) by mouth 3 (three) times daily as needed for dizziness. 30 tablet 0   melatonin 3 MG TABS tablet Take 3 mg by mouth daily at 6 (six) AM.     MICROLET LANCETS MISC Use to monitor glucose levels BID 200 each 1   Needles & Syringes MISC 1 Package by Does not apply route 2 (two) times daily with a meal. 100 each 6   propranolol (INDERAL) 10 MG tablet TAKE 2 TABLETS BY MOUTH TWICE A DAY 360 tablet 6   TRULICITY 3 MG/0.5ML SOPN INJECT 3 MG UNDER THE SKIN ONCE WEEKLY 6 mL 1   vitamin B-12 1000 MCG tablet Take 1 tablet (1,000 mcg total) by mouth daily. 30 tablet 0   No current facility-administered medications on file prior to visit.   Allergies  Allergen Reactions   Ativan [Lorazepam] Anxiety    Aggressive, irritability, anxiety, opposite effect of what ativan should have done Can tolerate valium.    Ciprofloxacin Other (See Comments)    Acute kidney failure (loss of balance reported by [facility] physicians)   Codeine Nausea And Vomiting   Dilaudid [Hydromorphone Hcl] Nausea And Vomiting and Other (See Comments)    Severe sweating   Mysoline [Primidone] Nausea Only   Oxycodone Nausea And Vomiting   Family History  Problem Relation Age of Onset   Cancer Brother        prostate   Heart disease Brother    Alzheimer's disease Brother    Diabetes Brother    Stroke Mother    Hypertension Mother    COPD Brother    Cancer  Brother        Prostate cancer   Hypertension Sister        Congestive heart failure   Diabetes Sister    Heart attack Son     PE: There were no vitals taken for this visit. Wt Readings from Last 3 Encounters:  10/11/22 220 lb (99.8 kg)  10/09/22 231 lb (104.8 kg)  02/10/22 234 lb (106.1 kg)   Constitutional: overweight, in NAD; in wheelchair Eyes:  EOMI, no exophthalmos ENT: no neck masses, no cervical lymphadenopathy Cardiovascular: RRR, No MRG, + significant bilateral pitting LE edema Respiratory: CTA B Musculoskeletal: no deformities Skin:no rashes Neurological: no tremor with outstretched hands  ASSESSMENT: 1. DM2, insulin-dependent, uncontrolled, with complications - CKD stage III - PN - Hypoglycemia-last episode 2022  2. HL  PLAN:  1. Patient with longstanding, uncontrolled, type 2 diabetes, on injectable antidiabetic regimen with Trulicity and premixed insulin, which we adjusted at last visit.  At that time, HbA1c was higher, at 8.5%.  Sugars were fluctuating in the morning and they were rarely checking later in the day.  Whenever checked after lunch, sugars appear to be at goal likely due to taking her insulin in the morning.  She was taking the insulin at the start of her meal and discussed about moving this 10 to 15 minutes before the meal.  I also recommended a dose of 75/25 Humalog before dinner.  We discussed about trying not to eat in the middle of the night as she was doing at that time, including cookies and other sweet snacks.  I recommended a referral  to nutrition but they refused.  I suggested several changes in her diet including increasing the fiber in her diet by increasing the intake of veggies.  I also recommended to eliminate some of the bread that she was eating.  If she was continuing to eat bread, to switch to whole-grain/whole-wheat option.  I also recommended to check some sugars at bedtime or 2 hours after dinner.  - I suggested to:  Patient  Instructions  Please continue: - Trulicity 3 mg weekly - Humalog 75/25 40 units 10 min before breakfast and 18 units before dinner.  Please check some sugars at bedtime or 2h after dinner.  Please return for another visit in 3-4 months.   - we checked her HbA1c: 7%  - advised to check sugars at different times of the day - 4x a day, rotating check times - advised for yearly eye exams >> she is UTD - return to clinic in 3-4 months  2. HL -Latest lipid panel was reviewed from 07/2020: LDL above our target of 70, otherwise fractions at goal: Lab Results  Component Value Date   CHOL 177 07/11/2020   HDL 54 07/11/2020   LDLCALC 96 07/11/2020   TRIG 156 (H) 07/11/2020   CHOLHDL 4.0 05/16/2016  - she is not on a statin - she is due for another lipid panel  Carlus Pavlov, MD PhD [facility]

## 2023-06-14 DIAGNOSIS — N1832 Chronic kidney disease, stage 3b: Secondary | ICD-10-CM | POA: Diagnosis not present

## 2023-06-14 DIAGNOSIS — M6281 Muscle weakness (generalized): Secondary | ICD-10-CM | POA: Diagnosis not present

## 2023-06-22 DIAGNOSIS — Z23 Encounter for immunization: Secondary | ICD-10-CM | POA: Diagnosis not present

## 2023-06-23 DIAGNOSIS — G250 Essential tremor: Secondary | ICD-10-CM | POA: Diagnosis not present

## 2023-06-23 DIAGNOSIS — N3281 Overactive bladder: Secondary | ICD-10-CM | POA: Diagnosis not present

## 2023-06-23 DIAGNOSIS — G4700 Insomnia, unspecified: Secondary | ICD-10-CM | POA: Diagnosis not present

## 2023-06-23 DIAGNOSIS — Z794 Long term (current) use of insulin: Secondary | ICD-10-CM | POA: Diagnosis not present

## 2023-06-23 DIAGNOSIS — N183 Chronic kidney disease, stage 3 unspecified: Secondary | ICD-10-CM | POA: Diagnosis not present

## 2023-06-23 DIAGNOSIS — E538 Deficiency of other specified B group vitamins: Secondary | ICD-10-CM | POA: Diagnosis not present

## 2023-06-23 DIAGNOSIS — E1122 Type 2 diabetes mellitus with diabetic chronic kidney disease: Secondary | ICD-10-CM | POA: Diagnosis not present

## 2023-06-23 DIAGNOSIS — G3184 Mild cognitive impairment, so stated: Secondary | ICD-10-CM | POA: Diagnosis not present

## 2023-06-23 DIAGNOSIS — Z7985 Long-term (current) use of injectable non-insulin antidiabetic drugs: Secondary | ICD-10-CM | POA: Diagnosis not present

## 2023-06-23 DIAGNOSIS — I878 Other specified disorders of veins: Secondary | ICD-10-CM | POA: Diagnosis not present

## 2023-06-25 DIAGNOSIS — R600 Localized edema: Secondary | ICD-10-CM | POA: Diagnosis not present

## 2023-06-25 DIAGNOSIS — N390 Urinary tract infection, site not specified: Secondary | ICD-10-CM | POA: Diagnosis not present

## 2023-06-25 DIAGNOSIS — E1122 Type 2 diabetes mellitus with diabetic chronic kidney disease: Secondary | ICD-10-CM | POA: Diagnosis not present

## 2023-06-30 DIAGNOSIS — G3184 Mild cognitive impairment, so stated: Secondary | ICD-10-CM | POA: Diagnosis not present

## 2023-06-30 DIAGNOSIS — E1122 Type 2 diabetes mellitus with diabetic chronic kidney disease: Secondary | ICD-10-CM | POA: Diagnosis not present

## 2023-06-30 DIAGNOSIS — N1832 Chronic kidney disease, stage 3b: Secondary | ICD-10-CM | POA: Diagnosis not present

## 2023-06-30 DIAGNOSIS — B964 Proteus (mirabilis) (morganii) as the cause of diseases classified elsewhere: Secondary | ICD-10-CM | POA: Diagnosis not present

## 2023-06-30 DIAGNOSIS — N390 Urinary tract infection, site not specified: Secondary | ICD-10-CM | POA: Diagnosis not present

## 2023-06-30 DIAGNOSIS — N183 Chronic kidney disease, stage 3 unspecified: Secondary | ICD-10-CM | POA: Diagnosis not present

## 2023-06-30 DIAGNOSIS — B9689 Other specified bacterial agents as the cause of diseases classified elsewhere: Secondary | ICD-10-CM | POA: Diagnosis not present

## 2023-07-07 DIAGNOSIS — Z8619 Personal history of other infectious and parasitic diseases: Secondary | ICD-10-CM | POA: Diagnosis not present

## 2023-07-07 DIAGNOSIS — I129 Hypertensive chronic kidney disease with stage 1 through stage 4 chronic kidney disease, or unspecified chronic kidney disease: Secondary | ICD-10-CM | POA: Diagnosis not present

## 2023-07-07 DIAGNOSIS — Z87440 Personal history of urinary (tract) infections: Secondary | ICD-10-CM | POA: Diagnosis not present

## 2023-07-07 DIAGNOSIS — Z794 Long term (current) use of insulin: Secondary | ICD-10-CM | POA: Diagnosis not present

## 2023-07-07 DIAGNOSIS — E1122 Type 2 diabetes mellitus with diabetic chronic kidney disease: Secondary | ICD-10-CM | POA: Diagnosis not present

## 2023-07-07 DIAGNOSIS — Z7985 Long-term (current) use of injectable non-insulin antidiabetic drugs: Secondary | ICD-10-CM | POA: Diagnosis not present

## 2023-07-07 DIAGNOSIS — N183 Chronic kidney disease, stage 3 unspecified: Secondary | ICD-10-CM | POA: Diagnosis not present

## 2023-07-12 DIAGNOSIS — I129 Hypertensive chronic kidney disease with stage 1 through stage 4 chronic kidney disease, or unspecified chronic kidney disease: Secondary | ICD-10-CM | POA: Diagnosis not present

## 2023-07-12 DIAGNOSIS — N189 Chronic kidney disease, unspecified: Secondary | ICD-10-CM | POA: Diagnosis not present

## 2023-07-12 DIAGNOSIS — Z794 Long term (current) use of insulin: Secondary | ICD-10-CM | POA: Diagnosis not present

## 2023-07-12 DIAGNOSIS — Z7985 Long-term (current) use of injectable non-insulin antidiabetic drugs: Secondary | ICD-10-CM | POA: Diagnosis not present

## 2023-07-12 DIAGNOSIS — E1122 Type 2 diabetes mellitus with diabetic chronic kidney disease: Secondary | ICD-10-CM | POA: Diagnosis not present

## 2023-07-14 DIAGNOSIS — N1832 Chronic kidney disease, stage 3b: Secondary | ICD-10-CM | POA: Diagnosis not present

## 2023-07-14 DIAGNOSIS — G4700 Insomnia, unspecified: Secondary | ICD-10-CM | POA: Diagnosis not present

## 2023-07-14 DIAGNOSIS — E1142 Type 2 diabetes mellitus with diabetic polyneuropathy: Secondary | ICD-10-CM | POA: Diagnosis not present

## 2023-07-14 DIAGNOSIS — B372 Candidiasis of skin and nail: Secondary | ICD-10-CM | POA: Diagnosis not present

## 2023-07-14 DIAGNOSIS — E1122 Type 2 diabetes mellitus with diabetic chronic kidney disease: Secondary | ICD-10-CM | POA: Diagnosis not present

## 2023-07-19 DIAGNOSIS — M6281 Muscle weakness (generalized): Secondary | ICD-10-CM | POA: Diagnosis not present

## 2023-07-19 DIAGNOSIS — G3184 Mild cognitive impairment, so stated: Secondary | ICD-10-CM | POA: Diagnosis not present

## 2023-07-20 DIAGNOSIS — M6281 Muscle weakness (generalized): Secondary | ICD-10-CM | POA: Diagnosis not present

## 2023-07-20 DIAGNOSIS — G3184 Mild cognitive impairment, so stated: Secondary | ICD-10-CM | POA: Diagnosis not present

## 2023-07-21 DIAGNOSIS — M6281 Muscle weakness (generalized): Secondary | ICD-10-CM | POA: Diagnosis not present

## 2023-07-21 DIAGNOSIS — G3184 Mild cognitive impairment, so stated: Secondary | ICD-10-CM | POA: Diagnosis not present

## 2023-07-22 DIAGNOSIS — G3184 Mild cognitive impairment, so stated: Secondary | ICD-10-CM | POA: Diagnosis not present

## 2023-07-22 DIAGNOSIS — M6281 Muscle weakness (generalized): Secondary | ICD-10-CM | POA: Diagnosis not present

## 2023-07-23 DIAGNOSIS — G3184 Mild cognitive impairment, so stated: Secondary | ICD-10-CM | POA: Diagnosis not present

## 2023-07-23 DIAGNOSIS — M6281 Muscle weakness (generalized): Secondary | ICD-10-CM | POA: Diagnosis not present

## 2023-07-26 DIAGNOSIS — M6281 Muscle weakness (generalized): Secondary | ICD-10-CM | POA: Diagnosis not present

## 2023-07-26 DIAGNOSIS — G3184 Mild cognitive impairment, so stated: Secondary | ICD-10-CM | POA: Diagnosis not present

## 2023-07-27 DIAGNOSIS — M6281 Muscle weakness (generalized): Secondary | ICD-10-CM | POA: Diagnosis not present

## 2023-07-27 DIAGNOSIS — G3184 Mild cognitive impairment, so stated: Secondary | ICD-10-CM | POA: Diagnosis not present

## 2023-07-28 DIAGNOSIS — G3184 Mild cognitive impairment, so stated: Secondary | ICD-10-CM | POA: Diagnosis not present

## 2023-07-28 DIAGNOSIS — E559 Vitamin D deficiency, unspecified: Secondary | ICD-10-CM | POA: Diagnosis not present

## 2023-07-28 DIAGNOSIS — M6281 Muscle weakness (generalized): Secondary | ICD-10-CM | POA: Diagnosis not present

## 2023-07-28 DIAGNOSIS — E1122 Type 2 diabetes mellitus with diabetic chronic kidney disease: Secondary | ICD-10-CM | POA: Diagnosis not present

## 2023-07-30 DIAGNOSIS — M6281 Muscle weakness (generalized): Secondary | ICD-10-CM | POA: Diagnosis not present

## 2023-07-30 DIAGNOSIS — G3184 Mild cognitive impairment, so stated: Secondary | ICD-10-CM | POA: Diagnosis not present

## 2023-07-31 DIAGNOSIS — M6281 Muscle weakness (generalized): Secondary | ICD-10-CM | POA: Diagnosis not present

## 2023-07-31 DIAGNOSIS — G3184 Mild cognitive impairment, so stated: Secondary | ICD-10-CM | POA: Diagnosis not present

## 2023-08-01 DIAGNOSIS — M6281 Muscle weakness (generalized): Secondary | ICD-10-CM | POA: Diagnosis not present

## 2023-08-01 DIAGNOSIS — G3184 Mild cognitive impairment, so stated: Secondary | ICD-10-CM | POA: Diagnosis not present

## 2023-08-02 DIAGNOSIS — Z7189 Other specified counseling: Secondary | ICD-10-CM | POA: Diagnosis not present

## 2023-08-02 DIAGNOSIS — I10 Essential (primary) hypertension: Secondary | ICD-10-CM | POA: Diagnosis not present

## 2023-08-03 DIAGNOSIS — G3184 Mild cognitive impairment, so stated: Secondary | ICD-10-CM | POA: Diagnosis not present

## 2023-08-03 DIAGNOSIS — M6281 Muscle weakness (generalized): Secondary | ICD-10-CM | POA: Diagnosis not present

## 2023-08-05 DIAGNOSIS — G3184 Mild cognitive impairment, so stated: Secondary | ICD-10-CM | POA: Diagnosis not present

## 2023-08-05 DIAGNOSIS — M6281 Muscle weakness (generalized): Secondary | ICD-10-CM | POA: Diagnosis not present

## 2023-08-06 DIAGNOSIS — M6281 Muscle weakness (generalized): Secondary | ICD-10-CM | POA: Diagnosis not present

## 2023-08-06 DIAGNOSIS — G3184 Mild cognitive impairment, so stated: Secondary | ICD-10-CM | POA: Diagnosis not present

## 2023-08-09 DIAGNOSIS — G3184 Mild cognitive impairment, so stated: Secondary | ICD-10-CM | POA: Diagnosis not present

## 2023-08-09 DIAGNOSIS — M6281 Muscle weakness (generalized): Secondary | ICD-10-CM | POA: Diagnosis not present

## 2023-08-10 DIAGNOSIS — G3184 Mild cognitive impairment, so stated: Secondary | ICD-10-CM | POA: Diagnosis not present

## 2023-08-10 DIAGNOSIS — M6281 Muscle weakness (generalized): Secondary | ICD-10-CM | POA: Diagnosis not present

## 2023-08-11 DIAGNOSIS — G3184 Mild cognitive impairment, so stated: Secondary | ICD-10-CM | POA: Diagnosis not present

## 2023-08-11 DIAGNOSIS — M6281 Muscle weakness (generalized): Secondary | ICD-10-CM | POA: Diagnosis not present

## 2023-08-12 DIAGNOSIS — G3184 Mild cognitive impairment, so stated: Secondary | ICD-10-CM | POA: Diagnosis not present

## 2023-08-12 DIAGNOSIS — M6281 Muscle weakness (generalized): Secondary | ICD-10-CM | POA: Diagnosis not present

## 2023-08-13 DIAGNOSIS — M6281 Muscle weakness (generalized): Secondary | ICD-10-CM | POA: Diagnosis not present

## 2023-08-13 DIAGNOSIS — G3184 Mild cognitive impairment, so stated: Secondary | ICD-10-CM | POA: Diagnosis not present

## 2023-08-18 DIAGNOSIS — B372 Candidiasis of skin and nail: Secondary | ICD-10-CM | POA: Diagnosis not present

## 2023-08-18 DIAGNOSIS — B9689 Other specified bacterial agents as the cause of diseases classified elsewhere: Secondary | ICD-10-CM | POA: Diagnosis not present

## 2023-08-18 DIAGNOSIS — I10 Essential (primary) hypertension: Secondary | ICD-10-CM | POA: Diagnosis not present

## 2023-08-23 DIAGNOSIS — R21 Rash and other nonspecific skin eruption: Secondary | ICD-10-CM | POA: Diagnosis not present

## 2023-08-23 DIAGNOSIS — I10 Essential (primary) hypertension: Secondary | ICD-10-CM | POA: Diagnosis not present

## 2023-08-30 DIAGNOSIS — Z7985 Long-term (current) use of injectable non-insulin antidiabetic drugs: Secondary | ICD-10-CM | POA: Diagnosis not present

## 2023-08-30 DIAGNOSIS — N390 Urinary tract infection, site not specified: Secondary | ICD-10-CM | POA: Diagnosis not present

## 2023-08-30 DIAGNOSIS — N189 Chronic kidney disease, unspecified: Secondary | ICD-10-CM | POA: Diagnosis not present

## 2023-08-30 DIAGNOSIS — I129 Hypertensive chronic kidney disease with stage 1 through stage 4 chronic kidney disease, or unspecified chronic kidney disease: Secondary | ICD-10-CM | POA: Diagnosis not present

## 2023-08-30 DIAGNOSIS — E559 Vitamin D deficiency, unspecified: Secondary | ICD-10-CM | POA: Diagnosis not present

## 2023-08-30 DIAGNOSIS — E1122 Type 2 diabetes mellitus with diabetic chronic kidney disease: Secondary | ICD-10-CM | POA: Diagnosis not present

## 2023-08-30 DIAGNOSIS — Z794 Long term (current) use of insulin: Secondary | ICD-10-CM | POA: Diagnosis not present

## 2023-08-30 DIAGNOSIS — E538 Deficiency of other specified B group vitamins: Secondary | ICD-10-CM | POA: Diagnosis not present

## 2023-09-03 DIAGNOSIS — N390 Urinary tract infection, site not specified: Secondary | ICD-10-CM | POA: Diagnosis not present

## 2023-09-03 DIAGNOSIS — E559 Vitamin D deficiency, unspecified: Secondary | ICD-10-CM | POA: Diagnosis not present

## 2023-09-15 DIAGNOSIS — E1165 Type 2 diabetes mellitus with hyperglycemia: Secondary | ICD-10-CM | POA: Diagnosis not present

## 2023-09-15 DIAGNOSIS — Z7985 Long-term (current) use of injectable non-insulin antidiabetic drugs: Secondary | ICD-10-CM | POA: Diagnosis not present

## 2023-09-15 DIAGNOSIS — N189 Chronic kidney disease, unspecified: Secondary | ICD-10-CM | POA: Diagnosis not present

## 2023-09-15 DIAGNOSIS — Z794 Long term (current) use of insulin: Secondary | ICD-10-CM | POA: Diagnosis not present

## 2023-09-15 DIAGNOSIS — E1122 Type 2 diabetes mellitus with diabetic chronic kidney disease: Secondary | ICD-10-CM | POA: Diagnosis not present

## 2023-09-22 DIAGNOSIS — E1165 Type 2 diabetes mellitus with hyperglycemia: Secondary | ICD-10-CM | POA: Diagnosis not present

## 2023-09-22 DIAGNOSIS — Z7985 Long-term (current) use of injectable non-insulin antidiabetic drugs: Secondary | ICD-10-CM | POA: Diagnosis not present

## 2023-09-22 DIAGNOSIS — Z794 Long term (current) use of insulin: Secondary | ICD-10-CM | POA: Diagnosis not present

## 2023-10-04 DIAGNOSIS — I129 Hypertensive chronic kidney disease with stage 1 through stage 4 chronic kidney disease, or unspecified chronic kidney disease: Secondary | ICD-10-CM | POA: Diagnosis not present

## 2023-10-04 DIAGNOSIS — E1122 Type 2 diabetes mellitus with diabetic chronic kidney disease: Secondary | ICD-10-CM | POA: Diagnosis not present

## 2023-10-04 DIAGNOSIS — N1832 Chronic kidney disease, stage 3b: Secondary | ICD-10-CM | POA: Diagnosis not present

## 2023-10-04 DIAGNOSIS — N390 Urinary tract infection, site not specified: Secondary | ICD-10-CM | POA: Diagnosis not present

## 2023-10-07 DIAGNOSIS — N1832 Chronic kidney disease, stage 3b: Secondary | ICD-10-CM | POA: Diagnosis not present

## 2023-10-07 DIAGNOSIS — N390 Urinary tract infection, site not specified: Secondary | ICD-10-CM | POA: Diagnosis not present

## 2023-10-07 DIAGNOSIS — E1122 Type 2 diabetes mellitus with diabetic chronic kidney disease: Secondary | ICD-10-CM | POA: Diagnosis not present

## 2023-10-13 DIAGNOSIS — Z7985 Long-term (current) use of injectable non-insulin antidiabetic drugs: Secondary | ICD-10-CM | POA: Diagnosis not present

## 2023-10-13 DIAGNOSIS — Z794 Long term (current) use of insulin: Secondary | ICD-10-CM | POA: Diagnosis not present

## 2023-10-13 DIAGNOSIS — E1165 Type 2 diabetes mellitus with hyperglycemia: Secondary | ICD-10-CM | POA: Diagnosis not present

## 2023-10-25 DIAGNOSIS — N189 Chronic kidney disease, unspecified: Secondary | ICD-10-CM | POA: Diagnosis not present

## 2023-10-25 DIAGNOSIS — Z7985 Long-term (current) use of injectable non-insulin antidiabetic drugs: Secondary | ICD-10-CM | POA: Diagnosis not present

## 2023-10-25 DIAGNOSIS — E559 Vitamin D deficiency, unspecified: Secondary | ICD-10-CM | POA: Diagnosis not present

## 2023-10-25 DIAGNOSIS — E1165 Type 2 diabetes mellitus with hyperglycemia: Secondary | ICD-10-CM | POA: Diagnosis not present

## 2023-10-25 DIAGNOSIS — G3184 Mild cognitive impairment, so stated: Secondary | ICD-10-CM | POA: Diagnosis not present

## 2023-10-25 DIAGNOSIS — Z794 Long term (current) use of insulin: Secondary | ICD-10-CM | POA: Diagnosis not present

## 2023-10-25 DIAGNOSIS — I129 Hypertensive chronic kidney disease with stage 1 through stage 4 chronic kidney disease, or unspecified chronic kidney disease: Secondary | ICD-10-CM | POA: Diagnosis not present

## 2023-10-25 DIAGNOSIS — E538 Deficiency of other specified B group vitamins: Secondary | ICD-10-CM | POA: Diagnosis not present

## 2023-10-26 DIAGNOSIS — E119 Type 2 diabetes mellitus without complications: Secondary | ICD-10-CM | POA: Diagnosis not present

## 2023-10-26 DIAGNOSIS — N390 Urinary tract infection, site not specified: Secondary | ICD-10-CM | POA: Diagnosis not present

## 2023-10-30 DIAGNOSIS — R0989 Other specified symptoms and signs involving the circulatory and respiratory systems: Secondary | ICD-10-CM | POA: Diagnosis not present

## 2023-11-01 DIAGNOSIS — Z794 Long term (current) use of insulin: Secondary | ICD-10-CM | POA: Diagnosis not present

## 2023-11-01 DIAGNOSIS — Z7985 Long-term (current) use of injectable non-insulin antidiabetic drugs: Secondary | ICD-10-CM | POA: Diagnosis not present

## 2023-11-01 DIAGNOSIS — E1165 Type 2 diabetes mellitus with hyperglycemia: Secondary | ICD-10-CM | POA: Diagnosis not present

## 2023-11-01 DIAGNOSIS — N189 Chronic kidney disease, unspecified: Secondary | ICD-10-CM | POA: Diagnosis not present

## 2023-11-01 DIAGNOSIS — J069 Acute upper respiratory infection, unspecified: Secondary | ICD-10-CM | POA: Diagnosis not present

## 2023-11-01 DIAGNOSIS — I129 Hypertensive chronic kidney disease with stage 1 through stage 4 chronic kidney disease, or unspecified chronic kidney disease: Secondary | ICD-10-CM | POA: Diagnosis not present

## 2023-11-08 DIAGNOSIS — Z794 Long term (current) use of insulin: Secondary | ICD-10-CM | POA: Diagnosis not present

## 2023-11-08 DIAGNOSIS — Z7985 Long-term (current) use of injectable non-insulin antidiabetic drugs: Secondary | ICD-10-CM | POA: Diagnosis not present

## 2023-11-08 DIAGNOSIS — I129 Hypertensive chronic kidney disease with stage 1 through stage 4 chronic kidney disease, or unspecified chronic kidney disease: Secondary | ICD-10-CM | POA: Diagnosis not present

## 2023-11-08 DIAGNOSIS — J069 Acute upper respiratory infection, unspecified: Secondary | ICD-10-CM | POA: Diagnosis not present

## 2023-11-08 DIAGNOSIS — M6281 Muscle weakness (generalized): Secondary | ICD-10-CM | POA: Diagnosis not present

## 2023-11-08 DIAGNOSIS — N189 Chronic kidney disease, unspecified: Secondary | ICD-10-CM | POA: Diagnosis not present

## 2023-11-08 DIAGNOSIS — E1165 Type 2 diabetes mellitus with hyperglycemia: Secondary | ICD-10-CM | POA: Diagnosis not present

## 2023-11-09 DIAGNOSIS — M6281 Muscle weakness (generalized): Secondary | ICD-10-CM | POA: Diagnosis not present

## 2023-11-11 DIAGNOSIS — M6281 Muscle weakness (generalized): Secondary | ICD-10-CM | POA: Diagnosis not present

## 2023-11-12 DIAGNOSIS — M6281 Muscle weakness (generalized): Secondary | ICD-10-CM | POA: Diagnosis not present

## 2023-11-13 DIAGNOSIS — M6281 Muscle weakness (generalized): Secondary | ICD-10-CM | POA: Diagnosis not present

## 2023-11-15 DIAGNOSIS — Z794 Long term (current) use of insulin: Secondary | ICD-10-CM | POA: Diagnosis not present

## 2023-11-15 DIAGNOSIS — G3184 Mild cognitive impairment, so stated: Secondary | ICD-10-CM | POA: Diagnosis not present

## 2023-11-15 DIAGNOSIS — E1165 Type 2 diabetes mellitus with hyperglycemia: Secondary | ICD-10-CM | POA: Diagnosis not present

## 2023-11-15 DIAGNOSIS — M6281 Muscle weakness (generalized): Secondary | ICD-10-CM | POA: Diagnosis not present

## 2023-11-15 DIAGNOSIS — I129 Hypertensive chronic kidney disease with stage 1 through stage 4 chronic kidney disease, or unspecified chronic kidney disease: Secondary | ICD-10-CM | POA: Diagnosis not present

## 2023-11-15 DIAGNOSIS — N189 Chronic kidney disease, unspecified: Secondary | ICD-10-CM | POA: Diagnosis not present

## 2023-11-15 DIAGNOSIS — J069 Acute upper respiratory infection, unspecified: Secondary | ICD-10-CM | POA: Diagnosis not present

## 2023-11-15 DIAGNOSIS — Z7985 Long-term (current) use of injectable non-insulin antidiabetic drugs: Secondary | ICD-10-CM | POA: Diagnosis not present

## 2023-11-16 DIAGNOSIS — M6281 Muscle weakness (generalized): Secondary | ICD-10-CM | POA: Diagnosis not present

## 2023-11-17 DIAGNOSIS — N189 Chronic kidney disease, unspecified: Secondary | ICD-10-CM | POA: Diagnosis not present

## 2023-11-17 DIAGNOSIS — Z7985 Long-term (current) use of injectable non-insulin antidiabetic drugs: Secondary | ICD-10-CM | POA: Diagnosis not present

## 2023-11-17 DIAGNOSIS — E1165 Type 2 diabetes mellitus with hyperglycemia: Secondary | ICD-10-CM | POA: Diagnosis not present

## 2023-11-17 DIAGNOSIS — I129 Hypertensive chronic kidney disease with stage 1 through stage 4 chronic kidney disease, or unspecified chronic kidney disease: Secondary | ICD-10-CM | POA: Diagnosis not present

## 2023-11-17 DIAGNOSIS — M6281 Muscle weakness (generalized): Secondary | ICD-10-CM | POA: Diagnosis not present

## 2023-11-17 DIAGNOSIS — Z794 Long term (current) use of insulin: Secondary | ICD-10-CM | POA: Diagnosis not present

## 2023-11-17 DIAGNOSIS — G3184 Mild cognitive impairment, so stated: Secondary | ICD-10-CM | POA: Diagnosis not present

## 2023-11-20 DIAGNOSIS — M6281 Muscle weakness (generalized): Secondary | ICD-10-CM | POA: Diagnosis not present

## 2023-11-22 DIAGNOSIS — M6281 Muscle weakness (generalized): Secondary | ICD-10-CM | POA: Diagnosis not present

## 2023-11-22 DIAGNOSIS — N1832 Chronic kidney disease, stage 3b: Secondary | ICD-10-CM | POA: Diagnosis not present

## 2023-11-22 DIAGNOSIS — E1122 Type 2 diabetes mellitus with diabetic chronic kidney disease: Secondary | ICD-10-CM | POA: Diagnosis not present

## 2023-11-23 DIAGNOSIS — I129 Hypertensive chronic kidney disease with stage 1 through stage 4 chronic kidney disease, or unspecified chronic kidney disease: Secondary | ICD-10-CM | POA: Diagnosis not present

## 2023-11-23 DIAGNOSIS — N189 Chronic kidney disease, unspecified: Secondary | ICD-10-CM | POA: Diagnosis not present

## 2023-11-23 DIAGNOSIS — N179 Acute kidney failure, unspecified: Secondary | ICD-10-CM | POA: Diagnosis not present

## 2023-11-23 DIAGNOSIS — M6281 Muscle weakness (generalized): Secondary | ICD-10-CM | POA: Diagnosis not present

## 2023-11-24 DIAGNOSIS — M6281 Muscle weakness (generalized): Secondary | ICD-10-CM | POA: Diagnosis not present

## 2023-11-25 DIAGNOSIS — M6281 Muscle weakness (generalized): Secondary | ICD-10-CM | POA: Diagnosis not present

## 2023-11-26 DIAGNOSIS — M6281 Muscle weakness (generalized): Secondary | ICD-10-CM | POA: Diagnosis not present

## 2023-11-29 DIAGNOSIS — M6281 Muscle weakness (generalized): Secondary | ICD-10-CM | POA: Diagnosis not present

## 2023-11-30 DIAGNOSIS — M6281 Muscle weakness (generalized): Secondary | ICD-10-CM | POA: Diagnosis not present

## 2023-11-30 DIAGNOSIS — N1832 Chronic kidney disease, stage 3b: Secondary | ICD-10-CM | POA: Diagnosis not present

## 2023-11-30 DIAGNOSIS — E1122 Type 2 diabetes mellitus with diabetic chronic kidney disease: Secondary | ICD-10-CM | POA: Diagnosis not present

## 2023-11-30 DIAGNOSIS — I129 Hypertensive chronic kidney disease with stage 1 through stage 4 chronic kidney disease, or unspecified chronic kidney disease: Secondary | ICD-10-CM | POA: Diagnosis not present

## 2023-12-01 DIAGNOSIS — I129 Hypertensive chronic kidney disease with stage 1 through stage 4 chronic kidney disease, or unspecified chronic kidney disease: Secondary | ICD-10-CM | POA: Diagnosis not present

## 2023-12-01 DIAGNOSIS — N189 Chronic kidney disease, unspecified: Secondary | ICD-10-CM | POA: Diagnosis not present

## 2023-12-01 DIAGNOSIS — Z794 Long term (current) use of insulin: Secondary | ICD-10-CM | POA: Diagnosis not present

## 2023-12-01 DIAGNOSIS — E1165 Type 2 diabetes mellitus with hyperglycemia: Secondary | ICD-10-CM | POA: Diagnosis not present

## 2023-12-01 DIAGNOSIS — Z7985 Long-term (current) use of injectable non-insulin antidiabetic drugs: Secondary | ICD-10-CM | POA: Diagnosis not present

## 2023-12-01 DIAGNOSIS — G250 Essential tremor: Secondary | ICD-10-CM | POA: Diagnosis not present

## 2023-12-02 DIAGNOSIS — M6281 Muscle weakness (generalized): Secondary | ICD-10-CM | POA: Diagnosis not present

## 2023-12-03 DIAGNOSIS — M6281 Muscle weakness (generalized): Secondary | ICD-10-CM | POA: Diagnosis not present

## 2023-12-07 DIAGNOSIS — E1165 Type 2 diabetes mellitus with hyperglycemia: Secondary | ICD-10-CM | POA: Diagnosis not present

## 2023-12-07 DIAGNOSIS — Z794 Long term (current) use of insulin: Secondary | ICD-10-CM | POA: Diagnosis not present

## 2024-01-04 DIAGNOSIS — Z794 Long term (current) use of insulin: Secondary | ICD-10-CM | POA: Diagnosis not present

## 2024-01-04 DIAGNOSIS — N189 Chronic kidney disease, unspecified: Secondary | ICD-10-CM | POA: Diagnosis not present

## 2024-01-04 DIAGNOSIS — E538 Deficiency of other specified B group vitamins: Secondary | ICD-10-CM | POA: Diagnosis not present

## 2024-01-04 DIAGNOSIS — E1165 Type 2 diabetes mellitus with hyperglycemia: Secondary | ICD-10-CM | POA: Diagnosis not present

## 2024-01-04 DIAGNOSIS — E559 Vitamin D deficiency, unspecified: Secondary | ICD-10-CM | POA: Diagnosis not present

## 2024-01-04 DIAGNOSIS — I129 Hypertensive chronic kidney disease with stage 1 through stage 4 chronic kidney disease, or unspecified chronic kidney disease: Secondary | ICD-10-CM | POA: Diagnosis not present

## 2024-01-04 DIAGNOSIS — Z7985 Long-term (current) use of injectable non-insulin antidiabetic drugs: Secondary | ICD-10-CM | POA: Diagnosis not present

## 2024-01-04 DIAGNOSIS — G3184 Mild cognitive impairment, so stated: Secondary | ICD-10-CM | POA: Diagnosis not present

## 2024-01-20 DIAGNOSIS — E1142 Type 2 diabetes mellitus with diabetic polyneuropathy: Secondary | ICD-10-CM | POA: Diagnosis not present

## 2024-01-20 DIAGNOSIS — N1832 Chronic kidney disease, stage 3b: Secondary | ICD-10-CM | POA: Diagnosis not present

## 2024-01-20 DIAGNOSIS — E1122 Type 2 diabetes mellitus with diabetic chronic kidney disease: Secondary | ICD-10-CM | POA: Diagnosis not present

## 2024-02-07 DIAGNOSIS — G3184 Mild cognitive impairment, so stated: Secondary | ICD-10-CM | POA: Diagnosis not present

## 2024-02-07 DIAGNOSIS — G4709 Other insomnia: Secondary | ICD-10-CM | POA: Diagnosis not present

## 2024-02-17 DIAGNOSIS — H524 Presbyopia: Secondary | ICD-10-CM | POA: Diagnosis not present

## 2024-02-17 DIAGNOSIS — Z961 Presence of intraocular lens: Secondary | ICD-10-CM | POA: Diagnosis not present

## 2024-02-17 DIAGNOSIS — E119 Type 2 diabetes mellitus without complications: Secondary | ICD-10-CM | POA: Diagnosis not present

## 2024-02-25 DIAGNOSIS — Z7985 Long-term (current) use of injectable non-insulin antidiabetic drugs: Secondary | ICD-10-CM | POA: Diagnosis not present

## 2024-02-25 DIAGNOSIS — E1122 Type 2 diabetes mellitus with diabetic chronic kidney disease: Secondary | ICD-10-CM | POA: Diagnosis not present

## 2024-02-25 DIAGNOSIS — G3184 Mild cognitive impairment, so stated: Secondary | ICD-10-CM | POA: Diagnosis not present

## 2024-02-25 DIAGNOSIS — E538 Deficiency of other specified B group vitamins: Secondary | ICD-10-CM | POA: Diagnosis not present

## 2024-02-25 DIAGNOSIS — N183 Chronic kidney disease, stage 3 unspecified: Secondary | ICD-10-CM | POA: Diagnosis not present

## 2024-02-25 DIAGNOSIS — E559 Vitamin D deficiency, unspecified: Secondary | ICD-10-CM | POA: Diagnosis not present

## 2024-02-25 DIAGNOSIS — Z794 Long term (current) use of insulin: Secondary | ICD-10-CM | POA: Diagnosis not present

## 2024-02-25 DIAGNOSIS — I129 Hypertensive chronic kidney disease with stage 1 through stage 4 chronic kidney disease, or unspecified chronic kidney disease: Secondary | ICD-10-CM | POA: Diagnosis not present

## 2024-02-28 DIAGNOSIS — R7309 Other abnormal glucose: Secondary | ICD-10-CM | POA: Diagnosis not present

## 2024-02-28 DIAGNOSIS — Z13228 Encounter for screening for other metabolic disorders: Secondary | ICD-10-CM | POA: Diagnosis not present

## 2024-02-29 DIAGNOSIS — M6281 Muscle weakness (generalized): Secondary | ICD-10-CM | POA: Diagnosis not present

## 2024-02-29 DIAGNOSIS — E1121 Type 2 diabetes mellitus with diabetic nephropathy: Secondary | ICD-10-CM | POA: Diagnosis not present

## 2024-02-29 DIAGNOSIS — R278 Other lack of coordination: Secondary | ICD-10-CM | POA: Diagnosis not present

## 2024-03-01 DIAGNOSIS — R278 Other lack of coordination: Secondary | ICD-10-CM | POA: Diagnosis not present

## 2024-03-01 DIAGNOSIS — E1121 Type 2 diabetes mellitus with diabetic nephropathy: Secondary | ICD-10-CM | POA: Diagnosis not present

## 2024-03-01 DIAGNOSIS — M6281 Muscle weakness (generalized): Secondary | ICD-10-CM | POA: Diagnosis not present

## 2024-03-02 DIAGNOSIS — R278 Other lack of coordination: Secondary | ICD-10-CM | POA: Diagnosis not present

## 2024-03-02 DIAGNOSIS — M6281 Muscle weakness (generalized): Secondary | ICD-10-CM | POA: Diagnosis not present

## 2024-03-02 DIAGNOSIS — E1121 Type 2 diabetes mellitus with diabetic nephropathy: Secondary | ICD-10-CM | POA: Diagnosis not present

## 2024-03-03 DIAGNOSIS — R278 Other lack of coordination: Secondary | ICD-10-CM | POA: Diagnosis not present

## 2024-03-03 DIAGNOSIS — M6281 Muscle weakness (generalized): Secondary | ICD-10-CM | POA: Diagnosis not present

## 2024-03-03 DIAGNOSIS — E1121 Type 2 diabetes mellitus with diabetic nephropathy: Secondary | ICD-10-CM | POA: Diagnosis not present

## 2024-03-06 DIAGNOSIS — M6281 Muscle weakness (generalized): Secondary | ICD-10-CM | POA: Diagnosis not present

## 2024-03-06 DIAGNOSIS — E1121 Type 2 diabetes mellitus with diabetic nephropathy: Secondary | ICD-10-CM | POA: Diagnosis not present

## 2024-03-06 DIAGNOSIS — R278 Other lack of coordination: Secondary | ICD-10-CM | POA: Diagnosis not present
